# Patient Record
Sex: Female | Born: 1980 | Hispanic: No | Marital: Married | Smoking: Never smoker
Health system: Southern US, Community
[De-identification: ages and names within clinical notes are randomized; demographics above are authoritative.]

## PROBLEM LIST (undated history)

## (undated) DIAGNOSIS — Z8619 Personal history of other infectious and parasitic diseases: Secondary | ICD-10-CM

## (undated) DIAGNOSIS — Z8042 Family history of malignant neoplasm of prostate: Secondary | ICD-10-CM

## (undated) DIAGNOSIS — N921 Excessive and frequent menstruation with irregular cycle: Secondary | ICD-10-CM

## (undated) DIAGNOSIS — D259 Leiomyoma of uterus, unspecified: Secondary | ICD-10-CM

## (undated) DIAGNOSIS — L819 Disorder of pigmentation, unspecified: Secondary | ICD-10-CM

## (undated) DIAGNOSIS — L608 Other nail disorders: Secondary | ICD-10-CM

## (undated) DIAGNOSIS — D649 Anemia, unspecified: Secondary | ICD-10-CM

## (undated) DIAGNOSIS — F419 Anxiety disorder, unspecified: Secondary | ICD-10-CM

## (undated) DIAGNOSIS — D229 Melanocytic nevi, unspecified: Secondary | ICD-10-CM

## (undated) DIAGNOSIS — K219 Gastro-esophageal reflux disease without esophagitis: Secondary | ICD-10-CM

## (undated) DIAGNOSIS — Z1509 Genetic susceptibility to other malignant neoplasm: Secondary | ICD-10-CM

## (undated) DIAGNOSIS — K7401 Hepatic fibrosis, early fibrosis: Secondary | ICD-10-CM

## (undated) DIAGNOSIS — E785 Hyperlipidemia, unspecified: Secondary | ICD-10-CM

## (undated) DIAGNOSIS — L299 Pruritus, unspecified: Secondary | ICD-10-CM

## (undated) DIAGNOSIS — R7689 Other specified abnormal immunological findings in serum: Secondary | ICD-10-CM

## (undated) DIAGNOSIS — I7300 Raynaud's syndrome without gangrene: Secondary | ICD-10-CM

## (undated) DIAGNOSIS — R768 Other specified abnormal immunological findings in serum: Secondary | ICD-10-CM

## (undated) DIAGNOSIS — K8020 Calculus of gallbladder without cholecystitis without obstruction: Secondary | ICD-10-CM

## (undated) DIAGNOSIS — F1020 Alcohol dependence, uncomplicated: Secondary | ICD-10-CM

## (undated) DIAGNOSIS — B029 Zoster without complications: Secondary | ICD-10-CM

## (undated) DIAGNOSIS — Z8041 Family history of malignant neoplasm of ovary: Secondary | ICD-10-CM

## (undated) DIAGNOSIS — K8301 Primary sclerosing cholangitis: Secondary | ICD-10-CM

## (undated) DIAGNOSIS — Z1501 Genetic susceptibility to malignant neoplasm of breast: Secondary | ICD-10-CM

## (undated) HISTORY — DX: Other specified abnormal immunological findings in serum: R76.8

## (undated) HISTORY — DX: Melanocytic nevi, unspecified: D22.9

## (undated) HISTORY — PX: TUBAL LIGATION: SHX77

## (undated) HISTORY — DX: Family history of malignant neoplasm of ovary: Z80.41

## (undated) HISTORY — DX: Disorder of pigmentation, unspecified: L81.9

## (undated) HISTORY — DX: Other nail disorders: L60.8

## (undated) HISTORY — DX: Personal history of other infectious and parasitic diseases: Z86.19

## (undated) HISTORY — DX: Family history of malignant neoplasm of prostate: Z80.42

## (undated) HISTORY — DX: Primary sclerosing cholangitis: K83.01

## (undated) HISTORY — DX: Alcohol dependence, uncomplicated: F10.20

## (undated) HISTORY — DX: Pruritus, unspecified: L29.9

## (undated) HISTORY — DX: Zoster without complications: B02.9

## (undated) HISTORY — DX: Other specified abnormal immunological findings in serum: R76.89

## (undated) HISTORY — DX: Hyperlipidemia, unspecified: E78.5

## (undated) HISTORY — DX: Anemia, unspecified: D64.9

## (undated) HISTORY — DX: Calculus of gallbladder without cholecystitis without obstruction: K80.20

---
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
## 2001-02-28 HISTORY — PX: CHOLECYSTECTOMY: SHX55

## 2001-03-26 ENCOUNTER — Encounter (HOSPITAL_COMMUNITY): Admission: AD | Admit: 2001-03-26 | Discharge: 2001-04-03 | Payer: Self-pay | Admitting: *Deleted

## 2001-03-30 ENCOUNTER — Inpatient Hospital Stay (HOSPITAL_COMMUNITY): Admission: AD | Admit: 2001-03-30 | Discharge: 2001-03-30 | Payer: Self-pay | Admitting: *Deleted

## 2001-04-02 ENCOUNTER — Inpatient Hospital Stay (HOSPITAL_COMMUNITY): Admission: AD | Admit: 2001-04-02 | Discharge: 2001-04-04 | Payer: Self-pay | Admitting: *Deleted

## 2001-10-09 ENCOUNTER — Encounter: Payer: Self-pay | Admitting: Emergency Medicine

## 2001-10-09 ENCOUNTER — Emergency Department (HOSPITAL_COMMUNITY): Admission: EM | Admit: 2001-10-09 | Discharge: 2001-10-09 | Payer: Self-pay | Admitting: Emergency Medicine

## 2002-01-18 ENCOUNTER — Emergency Department (HOSPITAL_COMMUNITY): Admission: EM | Admit: 2002-01-18 | Discharge: 2002-01-18 | Payer: Self-pay | Admitting: Emergency Medicine

## 2002-01-21 ENCOUNTER — Encounter: Payer: Self-pay | Admitting: Emergency Medicine

## 2002-01-22 ENCOUNTER — Encounter (INDEPENDENT_AMBULATORY_CARE_PROVIDER_SITE_OTHER): Payer: Self-pay | Admitting: Specialist

## 2002-01-22 ENCOUNTER — Inpatient Hospital Stay (HOSPITAL_COMMUNITY): Admission: EM | Admit: 2002-01-22 | Discharge: 2002-01-23 | Payer: Self-pay | Admitting: Emergency Medicine

## 2002-01-22 ENCOUNTER — Encounter: Payer: Self-pay | Admitting: Surgery

## 2005-04-26 ENCOUNTER — Other Ambulatory Visit: Admission: RE | Admit: 2005-04-26 | Discharge: 2005-04-26 | Payer: Self-pay | Admitting: Obstetrics and Gynecology

## 2006-08-08 ENCOUNTER — Inpatient Hospital Stay (HOSPITAL_COMMUNITY): Admission: AD | Admit: 2006-08-08 | Discharge: 2006-08-11 | Payer: Self-pay | Admitting: Obstetrics and Gynecology

## 2006-08-14 ENCOUNTER — Encounter: Admission: RE | Admit: 2006-08-14 | Discharge: 2006-09-12 | Payer: Self-pay | Admitting: Obstetrics and Gynecology

## 2006-09-13 ENCOUNTER — Encounter: Admission: RE | Admit: 2006-09-13 | Discharge: 2006-10-13 | Payer: Self-pay | Admitting: Obstetrics and Gynecology

## 2006-10-14 ENCOUNTER — Encounter: Admission: RE | Admit: 2006-10-14 | Discharge: 2006-11-13 | Payer: Self-pay | Admitting: Obstetrics and Gynecology

## 2006-11-14 ENCOUNTER — Encounter: Admission: RE | Admit: 2006-11-14 | Discharge: 2006-12-13 | Payer: Self-pay | Admitting: Obstetrics and Gynecology

## 2006-12-14 ENCOUNTER — Encounter: Admission: RE | Admit: 2006-12-14 | Discharge: 2007-01-13 | Payer: Self-pay | Admitting: Obstetrics and Gynecology

## 2007-01-14 ENCOUNTER — Encounter: Admission: RE | Admit: 2007-01-14 | Discharge: 2007-02-12 | Payer: Self-pay | Admitting: Obstetrics and Gynecology

## 2007-02-13 ENCOUNTER — Encounter: Admission: RE | Admit: 2007-02-13 | Discharge: 2007-03-15 | Payer: Self-pay | Admitting: Obstetrics and Gynecology

## 2007-03-16 ENCOUNTER — Encounter: Admission: RE | Admit: 2007-03-16 | Discharge: 2007-04-15 | Payer: Self-pay | Admitting: Obstetrics and Gynecology

## 2007-04-16 ENCOUNTER — Encounter: Admission: RE | Admit: 2007-04-16 | Discharge: 2007-05-13 | Payer: Self-pay | Admitting: Obstetrics and Gynecology

## 2007-05-14 ENCOUNTER — Encounter: Admission: RE | Admit: 2007-05-14 | Discharge: 2007-05-30 | Payer: Self-pay | Admitting: Obstetrics and Gynecology

## 2010-07-13 NOTE — Op Note (Signed)
NAME:  Alicia Carr, Alicia Carr             ACCOUNT NO.:  [id number]   MEDICAL RECORD NO.:  [id number]          PATIENT TYPE:  INP   LOCATION:  9166                          FACILITY:  [facility]   PHYSICIAN:  Kendra H. Tenny Craw, MD     DATE OF BIRTH:  1980-10-27   DATE OF PROCEDURE:  08/08/2006  DATE OF DISCHARGE:                               OPERATIVE REPORT   PREOPERATIVE DIAGNOSIS:  1. 40-4/7-week intrauterine pregnancy.  2. Nonreassuring fetal well-being   POSTOPERATIVE DIAGNOSIS:  1. 40-4/7-week intrauterine pregnancy.  2. Nonreassuring fetal well-being   PROCEDURE:  Emergent low transverse cesarean section, primary cesarean  section via Pfannenstiel's skin incision.   SURGEON:  Freddrick March. Tenny Craw, M.D.   ASSISTANT:  None.   ANESTHESIA:  Epidural.   SPECIMEN:  Placenta for disposal.   ESTIMATED BLOOD LOSS:  800 mL.   COMPLICATIONS:  None.   PROCEDURE:  The patient is a 30 year old gravida 2, para 1 who presented  at 40-4/7 weeks estimated gestational age to [facility]U this morning  complaining of contractions.  She was noted to be contracting only every  20-30 minutes however, she was noted to have some late appearing  decelerations with contractions on the monitor and the decision was made  to admit her and augment labor. The patient was admitted to labor and  delivery, amniotomy was performed and Pitocin was added to augment labor  and at various times during labor she was noted to have severe variables  with contractions with slow return to baseline. On last exam she was  noted to be 6 cm dilated and IUPC was inserted and amnioinfusion was  started. Very soon after this fetal heart tones were noted to fall to  the 50s to 60s and did not return back to baseline despite position  changes, stopping Pitocin, stopping amnioinfusion and then providing  supplemental oxygen and therefore the decision was made to proceed to  the operating room for an emergency cesarean section. In the operating  room fetal heart tones were noted to be back in the low 90s to 100s. At  one point they did return to the 130s however was then returned back to  the 100s. The cervix was reexamined and the patient was found to be 8-9  cm, however was still remote from delivery and given the change in  baseline and nonreassuring status the decision was made to proceed with  emergent cesarean section. The patient was placed in the dorsal supine  position in leftward tilt, prepped and draped in the normal sterile  fashion.  Epidural anesthesia was confirmed to be adequate. A scalpel  was used to make a Pfannenstiel's skin incision which was carried down  through the underlying layers of soft tissue to the fascia.  Fascia was  incised in the midline. Fascial incision was extended laterally with  Mayo scissors.  Superior aspect of the fascial incision was tented up  and the underlying rectus muscle was dissected off sharply with the Mayo  scissors.  The same procedure was repeated on the inferior aspect of the  fascial incision and the rectus muscles  were separated in the midline  and the abdominal peritoneum was entered bluntly with blunt dissection.  The abdominal peritoneum was stretched with blunt dissection, the  bladder blade was inserted.  The vesicouterine peritoneum was  identified, tented up, entered sharply with the Metzenbaum scissors and  the incision was extended laterally and the bladder flap was created  digitally. Scalpel was then used to make a low transverse incision on  the uterus.  The fetal vertex was identified in the OP presentation,  brought up to the uterine incision and with the assistance of a mushroom  vacuum was delivered through the uterine incision.  The infant was noted  to cry vigorously on the operative field. Cord was clamped and cut and  the infant was passed to the waiting pediatricians.  Cord gas was  collected.  The placenta was delivered spontaneously.  No evidence of   abruption was noted. There was no velamentous cord insertion.  No nuchal  cord was noted at the time of delivery. No explanation could be found  for nonreassuring fetal status. The uterus was exteriorized, cleared of  all clot and debris.  Uterine incision was repaired with 0 Vicryl in a  running fashion.  A second imbricating layer was performed and a 2-0  chromic was used in a figure-of-eight fashion was used to control a  midline extraneous bleeder. The uterus was returned to the abdominal  cavity, abdominal cavity was cleared of all clot and debris.  Uterine  incision was reinspected and found to be hemostatic. No obvious bleeders  were noted but the tissue did appear friable and so Surgicel was placed  over the uterine incision prior to closure. The abdominal peritoneum was  then closed with 2-0 Vicryl and the fascia was reapproximated with 0  looped PDS and the skin was closed with staples.  All sponge, lap,  needle counts were correct x2.  The patient tolerated the procedure well  and was brought to the recovery room in stable condition following the  procedure.      Freddrick March. Tenny Craw, MD  Electronically Signed     KHR/MEDQ  D:  08/08/2006  T:  08/08/2006  Job:  [id number]

## 2010-07-13 NOTE — Discharge Summary (Signed)
NAME:  Alicia Carr, Alicia Carr             ACCOUNT NO.:  [id number]   MEDICAL RECORD NO.:  [id number]          PATIENT TYPE:  INP   LOCATION:  9145                          FACILITY:  [facility]   PHYSICIAN:  Carrington Clamp, M.D. DATE OF BIRTH:  Sep 06, 1980   DATE OF ADMISSION:  08/08/2006  DATE OF DISCHARGE:  08/11/2006                               DISCHARGE SUMMARY   FINAL DIAGNOSES:  1. Intrauterine pregnancy at 40-4/7 weeks' gestation.  2. Post-term induction of labor.  3. Nonreassuring fetal well being.   PROCEDURE:  Emergent low transverse cesarean section, emergent primary  low transverse cesarean section.  Surgeon Dr. Waynard Reeds.  Complications none.   This 30 year old, G2, P1, presents at 40-4/7 weeks' gestation.  The  patient was in possible early labor.  She was admitted at this time  secondary to post dates to augment her labor.  There were some late  appearing decelerations noted with contractions.  Amniotomy was carried  out.  Amnioinfusion was started.  Soon after this, fetal heart tones  fell to 50s to 60s and did not return to baseline despite position  change, stopping Pitocin and stopping amnio infusion as well as  supplemental oxygen.  At this point, a decision was made to take the  patient for a emergency cesarean section secondary to the nonreassuring  fetal well being.  The patient was taken to the operating room on August 08, 2006 by Dr. Waynard Reeds where a primary low transverse cesarean  section was performed with the delivery of a 6-pound 6-ounce female  infant with Apgars of 9 and 9.  Delivery went without complications.  The patient's postoperative course was benign without any significant  fevers.  The patient was felt ready for discharge on postoperative day  #3.  She was sent home on a regular diet, told to decrease activities,  was told to continue her prenatal vitamins and an iron supplement daily,  was given Percocet 1 every 4 hours as needed for pain, told  she could  use ibuprofen up to 600 mg every 6 hours as needed for pain, was to  follow up in our office in 2 weeks for an incision check.   LABORATORY DATA ON DISCHARGE:  The patient had a hemoglobin of 9.9,  white blood cell count of 10.1 and platelets of 142,000.      [name], P.A.-C.      Carrington Clamp, M.D.  Electronically Signed    MB/MEDQ  D:  09/15/2006  T:  09/16/2006  Job:  [id number]

## 2010-07-16 NOTE — H&P (Signed)
NAME:  Alicia Carr                      ACCOUNT NO.:  [id number]   MEDICAL RECORD NO.:  [id number]                   PATIENT TYPE:  INP   LOCATION:  0448                                 [facility]ACILITY:  [facility]   PHYSICIAN:  Velora Heckler, M.D.                DATE OF BIRTH:  11-Jan-1981   DATE OF ADMISSION:  01/21/2002  DATE OF DISCHARGE:                                HISTORY & PHYSICAL   REFERRING PHYSICIAN:  Carleene Cooper, M.D.   CHIEF COMPLAINT:  Abdominal pain.   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  The patient was  initially seen with epigastric abdominal pain radiating to the back on  01/18/02.  She was seen and evaluated.  Urinalysis was performed.  The  patient was prescribed Prevacid, and discharged from the emergency  department.  The patient had persistent epigastric abdominal pain radiating  to the back with onset of nausea and vomiting.  She was unable to tolerate  liquids or solid food.  This persisted.  She returned to the emergency  department today for further evaluation.   LABORATORY DATA:  Abnormal liver function tests.  The patient underwent  ultrasound of the abdomen which showed small gallstones with sludge in the  gallbladder, a dilated common bile duct to 9 mm, and the possibility of a  distal common bile duct stone.   General surgery is consulted at this time for evaluation and management.   REVIEW OF SYMPTOMS:  Fifteen system review discussed with the patient at the  bedside.  No significant other findings except as noted above.   PAST MEDICAL HISTORY:  Unremarkable.   MEDICATIONS:  1. Birth control pills.  2. Prevacid since Friday, 01/18/02.   ALLERGIES:  No known drug allergies.   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.  She does not  smoke.  She does not drink alcohol.  She is  accompanied by a college friend.   FAMILY HISTORY:  Unremarkable.   PHYSICAL EXAMINATION:  GENERAL:  A 30 year old well-developed, well-  nourished black female on a stretcher in the emergency department.  VITAL SIGNS:  Temperature 97.8, pulse 105, respirations 18, blood pressure  126/71.  HEENT:  Normocephalic, atraumatic.  Sclerae are clear.  Dentition is good.  Voice is normal.  NECK:  Symmetric.  Thyroid is normal without nodularity.  There is no  anterior or posterior cervical adenopathy.  There are no supraclavicular  masses.  LUNGS:  Clear to auscultation.  CARDIAC:  Regular rate and rhythm without murmur.  ABDOMEN:  Soft without distention.  There are bowel sounds present.  There  is mild tenderness in the epigastrium and right upper quadrant.  There are  no palpable masses.  There is  a mild Murphy's sign present.  Pain radiates  to the back.  EXTREMITIES:  Nontender without edema.  NEUROLOGIC:  The patient is alert and oriented without focal deficit.   LABORATORY DATA:  Laboratory studies showed a white blood cell count of 7.8,  hemoglobin 13.3, platelet count 288,000.  Total bilirubin 1.2, SGOT 87, SGPT  176, alkaline phosphatase 163.  Urinalysis shows positive for bilirubin and  trace leukocyte esterase.   Ultrasound shows small gallstones or sludge.  Common bile duct is dilated to  9 mm, distal common bile duct is poorly seen and a stone cannot be ruled  out.   DIAGNOSIS:  Symptomatic cholelithiasis, rule out choledocholithiasis.   PLAN:  1. Admission to [facility].  2. Initiation of empiric antibiotics.  3. Repeat laboratory studies in the a.m. on 01/22/02.  4. Consider based on laboratory study results proceeding to endoscopic     retrograde cholangiopancreatography with gastroenterology consultation     versus scheduling for a laparoscopic cholecystectomy in the next 24 to 48     hours.   I discussed in  length with the patient the indications for admission.  I  described the problems with the gallbladder and with the bile ducts.  I  explained the fact that we would repeat her laboratory studies in the  morning, and if these remained abnormal, she may require gastroenterology  evaluation and endoscopic retrograde cholangiopancreatography.  Otherwise,  she will require cholecystectomy during this admission.                                                 Velora Heckler, M.D.    TMG/MEDQ  D:  01/21/2002  T:  01/22/2002  Job:  [id number]

## 2010-12-16 LAB — CBC
HCT: 29.7 — ABNORMAL LOW
HCT: 33.9 — ABNORMAL LOW
Hemoglobin: 10.9 — ABNORMAL LOW
Hemoglobin: 9.9 — ABNORMAL LOW
MCHC: 32.3
MCHC: 33.3
MCV: 78.9
MCV: 79.3
Platelets: 142 — ABNORMAL LOW
Platelets: 180
RBC: 3.76 — ABNORMAL LOW
RBC: 4.27
RDW: 16.3 — ABNORMAL HIGH
RDW: 16.7 — ABNORMAL HIGH
WBC: 10.1
WBC: 8.3

## 2010-12-16 LAB — RPR: RPR Ser Ql: NONREACTIVE

## 2016-08-24 ENCOUNTER — Encounter (INDEPENDENT_AMBULATORY_CARE_PROVIDER_SITE_OTHER): Payer: Self-pay

## 2016-08-24 ENCOUNTER — Ambulatory Visit: Payer: Self-pay | Admitting: Physician Assistant

## 2016-08-24 ENCOUNTER — Encounter: Payer: Self-pay | Admitting: Physician Assistant

## 2016-08-24 VITALS — BP 125/79 | HR 82 | Temp 98.5°F | Resp 16 | Ht 61.0 in | Wt 178.0 lb

## 2016-08-24 DIAGNOSIS — Z0189 Encounter for other specified special examinations: Secondary | ICD-10-CM

## 2016-08-24 DIAGNOSIS — Z0000 Encounter for general adult medical examination without abnormal findings: Secondary | ICD-10-CM

## 2016-08-24 DIAGNOSIS — Z008 Encounter for other general examination: Secondary | ICD-10-CM

## 2016-08-24 NOTE — Progress Notes (Signed)
S: pt here for wellness physical and biometrics for insurance purposes, no complaints ros neg; pt is requesting a mammogram as her mother had several cysts removed from her breasts, states she has not felt any lumps or masses but her breasts are large and dense; ? If her grandmother died of breast CA; PMH:  neg  Social: occ etoh nonsmoker Fam: mental illness etoh abuse, an aunt died early of dmI  O: vitals wnl, nad, ENT wnl, neck supple no lymph, lungs c t a, cv rrr, abd soft nontender bs normal all 4 quads  A: wellness, biometric physical  P: labs, explained to pt that insurance may not pay at age 36 for mammogram screening but we would put the order in

## 2016-08-25 LAB — CMP12+LP+TP+TSH+6AC+CBC/D/PLT
ALT: 54 IU/L — ABNORMAL HIGH (ref 0–32)
AST: 46 IU/L — ABNORMAL HIGH (ref 0–40)
Albumin/Globulin Ratio: 1.7 (ref 1.2–2.2)
Albumin: 4.5 g/dL (ref 3.5–5.5)
Alkaline Phosphatase: 197 IU/L — ABNORMAL HIGH (ref 39–117)
BUN/Creatinine Ratio: 19 (ref 9–23)
BUN: 17 mg/dL (ref 6–20)
Basophils Absolute: 0 10*3/uL (ref 0.0–0.2)
Basos: 0 %
Bilirubin Total: <0.2 mg/dL (ref 0.0–1.2)
Calcium: 9.5 mg/dL (ref 8.7–10.2)
Chloride: 106 mmol/L (ref 96–106)
Chol/HDL Ratio: 3.3 ratio (ref 0.0–4.4)
Cholesterol, Total: 211 mg/dL — ABNORMAL HIGH (ref 100–199)
Creatinine, Ser: 0.91 mg/dL (ref 0.57–1.00)
EOS (ABSOLUTE): 0.2 10*3/uL (ref 0.0–0.4)
Eos: 3 %
Estimated CHD Risk: <0.5 times avg. (ref 0.0–1.0)
Free Thyroxine Index: 2.2 (ref 1.2–4.9)
GFR calc Af Amer: 95 mL/min/{1.73_m2} (ref >59)
GFR calc non Af Amer: 83 mL/min/{1.73_m2} (ref >59)
GGT: 151 IU/L — ABNORMAL HIGH (ref 0–60)
Globulin, Total: 2.6 g/dL (ref 1.5–4.5)
Glucose: 86 mg/dL (ref 65–99)
HDL: 63 mg/dL (ref >39)
Hematocrit: 37.5 % (ref 34.0–46.6)
Hemoglobin: 12.1 g/dL (ref 11.1–15.9)
Immature Grans (Abs): 0 10*3/uL (ref 0.0–0.1)
Immature Granulocytes: 0 %
Iron: 46 ug/dL (ref 27–159)
LDH: 237 IU/L — ABNORMAL HIGH (ref 119–226)
LDL Calculated: 129 mg/dL — ABNORMAL HIGH (ref 0–99)
Lymphocytes Absolute: 2 10*3/uL (ref 0.7–3.1)
Lymphs: 36 %
MCH: 26.1 pg — ABNORMAL LOW (ref 26.6–33.0)
MCHC: 32.3 g/dL (ref 31.5–35.7)
MCV: 81 fL (ref 79–97)
Monocytes Absolute: 0.4 10*3/uL (ref 0.1–0.9)
Monocytes: 6 %
Neutrophils Absolute: 3.1 10*3/uL (ref 1.4–7.0)
Neutrophils: 55 %
Phosphorus: 4.6 mg/dL — ABNORMAL HIGH (ref 2.5–4.5)
Platelets: 273 10*3/uL (ref 150–379)
Potassium: 5.1 mmol/L (ref 3.5–5.2)
RBC: 4.63 x10E6/uL (ref 3.77–5.28)
RDW: 14.9 % (ref 12.3–15.4)
Sodium: 144 mmol/L (ref 134–144)
T3 Uptake Ratio: 26 % (ref 24–39)
T4, Total: 8.5 ug/dL (ref 4.5–12.0)
TSH: 1.25 u[IU]/mL (ref 0.450–4.500)
Total Protein: 7.1 g/dL (ref 6.0–8.5)
Triglycerides: 94 mg/dL (ref 0–149)
Uric Acid: 4.5 mg/dL (ref 2.5–7.1)
VLDL Cholesterol Cal: 19 mg/dL (ref 5–40)
WBC: 5.6 10*3/uL (ref 3.4–10.8)

## 2016-08-25 LAB — VITAMIN D 25 HYDROXY (VIT D DEFICIENCY, FRACTURES): Vit D, 25-Hydroxy: 17 ng/mL — ABNORMAL LOW (ref 30.0–100.0)

## 2016-12-21 ENCOUNTER — Ambulatory Visit: Payer: Self-pay | Admitting: Physician Assistant

## 2016-12-21 ENCOUNTER — Encounter: Payer: Self-pay | Admitting: Physician Assistant

## 2016-12-21 VITALS — BP 118/79 | HR 100 | Temp 98.3°F | Resp 16

## 2016-12-21 DIAGNOSIS — E559 Vitamin D deficiency, unspecified: Secondary | ICD-10-CM

## 2016-12-21 DIAGNOSIS — R748 Abnormal levels of other serum enzymes: Secondary | ICD-10-CM

## 2016-12-21 DIAGNOSIS — J0100 Acute maxillary sinusitis, unspecified: Secondary | ICD-10-CM

## 2016-12-21 MED ORDER — AMOXICILLIN 875 MG PO TABS: 875.0000 mg | ORAL_TABLET | Freq: Two times a day (BID) | ORAL | 0 refills | Status: DC

## 2016-12-21 MED ORDER — VITAMIN D (ERGOCALCIFEROL) 1.25 MG (50000 UNIT) PO CAPS: 50000.0000 [IU] | ORAL_CAPSULE | ORAL | 3 refills | Status: DC

## 2016-12-21 MED ORDER — BENZONATATE 200 MG PO CAPS: 200.0000 mg | ORAL_CAPSULE | Freq: Three times a day (TID) | ORAL | 0 refills | Status: DC | PRN

## 2016-12-21 NOTE — Progress Notes (Signed)
S: C/o sore throat, cough,  and congestion for 5 days, no fever, chills, cp/sob, v/d; had flu shot last week, sx have gotten worse instead of better; also needs vit d rx as her vit d was 17 on recent labs  Using otc meds:   O: PE: vitals wnl, nad, perrl eomi, normocephalic, tms dull, nasal mucosa red and swollen, throat injected, neck supple no lymph, lungs c t a, cv rrr, neuro intact  A:  Acute sinusitis  P: drink fluids, continue regular meds , use otc meds of choice, return if not improving in 5 days, return earlier if worsening , amoxil, tessalon perls, rx for vit d

## 2016-12-22 LAB — HEPATIC FUNCTION PANEL
ALT: 34 IU/L — ABNORMAL HIGH (ref 0–32)
AST: 32 IU/L (ref 0–40)
Albumin: 4.5 g/dL (ref 3.5–5.5)
Alkaline Phosphatase: 181 IU/L — ABNORMAL HIGH (ref 39–117)
Bilirubin Total: <0.2 mg/dL (ref 0.0–1.2)
Bilirubin, Direct: 0.09 mg/dL (ref 0.00–0.40)
Total Protein: 7.3 g/dL (ref 6.0–8.5)

## 2016-12-22 LAB — HEPATITIS PANEL, ACUTE
Hep A IgM: NEGATIVE
Hep B C IgM: NEGATIVE
Hep C Virus Ab: <0.1 s/co ratio (ref 0.0–0.9)
Hepatitis B Surface Ag: NEGATIVE

## 2017-07-18 ENCOUNTER — Ambulatory Visit: Payer: Self-pay | Admitting: Family Medicine

## 2017-07-18 VITALS — BP 134/81 | HR 79 | Temp 98.4°F | Resp 16 | Ht 60.0 in | Wt 187.0 lb

## 2017-07-18 DIAGNOSIS — Z0189 Encounter for other specified special examinations: Principal | ICD-10-CM

## 2017-07-18 DIAGNOSIS — Z008 Encounter for other general examination: Secondary | ICD-10-CM

## 2017-07-18 LAB — GLUCOSE, POCT (MANUAL RESULT ENTRY): POC Glucose: 100 mg/dl — AB (ref 70–99)

## 2017-07-18 NOTE — Progress Notes (Signed)
Subjective: Annual biometrics screening  Patient presents for her annual biometric screening. Patient reports that she does not regularly eat a healthy, well-rounded diet or get regular exercise.  Patient reports stress at work has resulted in inactivity and gradual weight gain over the last 2 years. PCP: None currently. Patient works for [employer]. Patient denies any other issues or concerns.   Review of Systems Unremarkable  Objective  Physical Exam General: Awake, alert and oriented. No acute distress. Well developed, hydrated and nourished. Appears stated age.  HEENT: Supple neck without adenopathy. Sclera is non-icteric. The ear canal is clear without discharge. The tympanic membrane is normal in appearance with normal landmarks and cone of light. Nasal mucosa is pink and moist. Oral mucosa is pink and moist. The pharynx is normal in appearance without tonsillar swelling or exudates.  Skin: Skin in warm, dry and intact without rashes or lesions. Appropriate color for ethnicity. Cardiac: Heart rate and rhythm are normal. No murmurs, gallops, or rubs are auscultated.  Respiratory: The chest wall is symmetric and without deformity. No signs of respiratory distress. Lung sounds are clear in all lobes bilaterally without rales, ronchi, or wheezes.  Neurological: The patient is awake, alert and oriented to person, place, and time with normal speech.  Memory is normal and thought processes intact. No gait abnormalities are appreciated.  Psychiatric: Appropriate mood and affect.   Assessment Annual biometrics screening  Plan  Lipid panel pending. Encouraged routine visits with primary care provider.  Provided patient with resources and encouraged her to establish care with a new primary care provider. Fasting blood sugar is 100 today.  Discussed normal values and impaired fasting glucose.  Discussed lifestyle modifications.  Advised patient to follow-up with her primary care provider regarding  this. Encouraged patient to get regular exercise and eat a healthy, well-rounded diet.

## 2017-07-19 LAB — LIPID PANEL
Chol/HDL Ratio: 3.7 ratio (ref 0.0–4.4)
Cholesterol, Total: 217 mg/dL — ABNORMAL HIGH (ref 100–199)
HDL: 58 mg/dL (ref >39)
LDL Calculated: 139 mg/dL — ABNORMAL HIGH (ref 0–99)
Triglycerides: 100 mg/dL (ref 0–149)
VLDL Cholesterol Cal: 20 mg/dL (ref 5–40)

## 2017-07-19 NOTE — Progress Notes (Signed)
Dear [name], I wanted to let you know that your lipid panel came back.  Everything is normal, with the exception of your total cholesterol and LDL cholesterol.  Your total cholesterol is elevated at 217, normal values are between 100 and 199. Your LDL cholesterol ("bad cholesterol") is elevated at 139, normal values are below 99. These abnormal values increase your risk for cardiovascular disease.  I want you to follow-up with your primary care provider regarding these results.

## 2018-09-07 ENCOUNTER — Other Ambulatory Visit: Payer: Self-pay

## 2018-09-07 ENCOUNTER — Encounter: Payer: Self-pay | Admitting: Adult Health

## 2018-09-07 ENCOUNTER — Ambulatory Visit: Payer: Managed Care, Other (non HMO) | Admitting: Adult Health

## 2018-09-07 ENCOUNTER — Telehealth: Payer: Self-pay | Admitting: *Deleted

## 2018-09-07 DIAGNOSIS — Z20828 Contact with and (suspected) exposure to other viral communicable diseases: Secondary | ICD-10-CM | POA: Diagnosis not present

## 2018-09-07 DIAGNOSIS — Z7189 Other specified counseling: Secondary | ICD-10-CM

## 2018-09-07 DIAGNOSIS — Z789 Other specified health status: Secondary | ICD-10-CM

## 2018-09-07 DIAGNOSIS — R197 Diarrhea, unspecified: Secondary | ICD-10-CM | POA: Diagnosis not present

## 2018-09-07 DIAGNOSIS — R52 Pain, unspecified: Secondary | ICD-10-CM

## 2018-09-07 DIAGNOSIS — R05 Cough: Secondary | ICD-10-CM | POA: Diagnosis not present

## 2018-09-07 DIAGNOSIS — R059 Cough, unspecified: Secondary | ICD-10-CM

## 2018-09-07 DIAGNOSIS — Z20822 Contact with and (suspected) exposure to covid-19: Secondary | ICD-10-CM

## 2018-09-07 NOTE — Progress Notes (Addendum)
Virtual Visit via Telephone Note  I connected with Alicia Carr on 09/07/18 at 11:00 AM EDT by telephone and verified that I am speaking with the correct person using two identifiers.  Location: Patient: at home  Provider: [facility]    I discussed the limitations, risks, security and privacy concerns of performing an evaluation and management service by telephone and the availability of in person appointments. I also discussed with the patient that there may be a patient responsible charge related to this service. The patient expressed understanding and agreed to proceed.   History of Present Illness: No Known Allergies   Patient is a 38 year old female in no acute distress who calls the clinic for a telephone visit, she has had a sore throat that started on Wednesday 09/05/18.   She started with occasional cough and diarrhea, body aches. She is afebrile. She has mild ear fullness bilateral.  She denies white patches in throat.   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  She denies any tick bite. She has had diarrhea 3 times today, denies blood, tarry color or mucous. She is able to eat and drink normally. She denies any difficulty swallowing.   She has a friend who she has been in contact with he has similar symptoms and was tested for Covid 19. 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  Patient  denies any fever,chills, rash, chest pain, shortness of breath, nausea, vomiting.  Denies any pregnancy. Not breastfeeding.   LMP 3 weeks ago was normal.   Observations/Objective: 98.4 she is not taking any motrin or tylenol. No other vital signs available.   Patient is alert and oriented and responsive to questions Engages in conversation with provider. Speaks in full sentences without any pauses without any shortness of breath or distress.     Assessment and Plan: 1. Close Exposure to Covid-19 Virus   2. Educated About 2019  Novel Coronavirus Infection   3. Diarrhea, unspecified type   4. Cough   5. Generalized body aches   6. Afebrile    She denies any need for cough medication. Discussed symptom management. Hydration.  Covid 19 home monitoring ordered.   Follow Up Instructions:   Advised patient call the office or your primary care doctor for an appointment if no improvement within 72 hours or if any symptoms change or worsen at any time  Advised ER or urgent Care if after hours or on weekend. Call 911 for emergency symptoms at any time.Patinet verbalized understanding of all instructions given/reviewed and treatment plan and has no further questions or concerns at this time.     I discussed the assessment and treatment plan with the patient. The patient was provided an opportunity to ask questions and all were answered. The patient agreed with the plan and demonstrated an understanding of the instructions.   The patient was advised to call back or seek an in-person evaluation if the symptoms worsen or if the condition fails to improve as anticipated.  I provided 20 minutes of non-face-to-face time during this encounter.   Marcille Buffy, FNP

## 2018-09-07 NOTE — Telephone Encounter (Signed)
Pt called and scheduled for testing at [facility] site on 09/07/18. Pt advised to wear a mask and remain in car at scheduled appt time. Understanding verbalized.

## 2018-09-07 NOTE — Telephone Encounter (Signed)
-----   Message from Doreen Beam, [place] sent at 09/07/2018 11:19 AM EDT ----- She lives in [place] has symptoms of Covid / possible exposure needs testing prefers  testing location if available today- if not she will go to [facility] building  If she can be tested today

## 2018-09-07 NOTE — Addendum Note (Signed)
Addended by: Doreen Beam on: 09/07/2018 11:37 AM   Modules accepted: Orders

## 2018-09-09 ENCOUNTER — Encounter (INDEPENDENT_AMBULATORY_CARE_PROVIDER_SITE_OTHER): Payer: Self-pay

## 2018-09-10 ENCOUNTER — Encounter (INDEPENDENT_AMBULATORY_CARE_PROVIDER_SITE_OTHER): Payer: Self-pay

## 2018-09-10 ENCOUNTER — Telehealth: Payer: Self-pay | Admitting: *Deleted

## 2018-09-10 NOTE — Telephone Encounter (Signed)
Call and unable to LVM.

## 2018-09-10 NOTE — Telephone Encounter (Signed)
Pt called after receiving BPA for new onset SOB via [facility] Patient Monitoring questionnaire. Pt states that "I did not have SOB before so I don't know if I feel this way today because I am paranoid.It's not that bad. Feels like I have to take a gulp of air at times". Pt states that she noticed she was SOB today while sitting at the table doing work.Pt states "If feels like a little pressure, slight maybe in the middle of my chest but I don't know how to describe it". Pt advised to seek treatment in the ED tonight if she has worsening SOB, difficulty breathing or wheezing. Pt advised that she would need to establish care with a PCP in so that current symptoms could be monitored.Pt verbalized understanding.  Pt can be contacted at  [phone] to schedule a new patient appt. Pt does not have a provider preference. Pt has [employer].

## 2018-09-10 NOTE — Telephone Encounter (Signed)
Please call and set up a new patient virtual appointment.

## 2018-09-11 ENCOUNTER — Encounter (INDEPENDENT_AMBULATORY_CARE_PROVIDER_SITE_OTHER): Payer: Self-pay

## 2018-09-11 LAB — NOVEL CORONAVIRUS, NAA: SARS-CoV-2, NAA: NOT DETECTED

## 2018-09-12 ENCOUNTER — Encounter: Payer: Self-pay | Admitting: Physician Assistant

## 2018-09-12 ENCOUNTER — Ambulatory Visit (INDEPENDENT_AMBULATORY_CARE_PROVIDER_SITE_OTHER): Payer: Self-pay | Admitting: Physician Assistant

## 2018-09-12 DIAGNOSIS — Z7189 Other specified counseling: Secondary | ICD-10-CM

## 2018-09-12 DIAGNOSIS — R059 Cough, unspecified: Secondary | ICD-10-CM

## 2018-09-12 DIAGNOSIS — R05 Cough: Secondary | ICD-10-CM

## 2018-09-12 MED ORDER — PREDNISONE 20 MG PO TABS: 40.0000 mg | ORAL_TABLET | Freq: Every day | ORAL | 0 refills | Status: DC

## 2018-09-12 MED ORDER — AZITHROMYCIN 250 MG PO TABS: ORAL_TABLET | ORAL | 0 refills | Status: DC

## 2018-09-12 NOTE — Progress Notes (Signed)
Virtual Visit via Video   I connected with Alicia Carr on 09/12/18 at 10:40 AM EDT by a video enabled telemedicine application and verified that I am speaking with the correct person using two identifiers. Location patient: Home Location provider: [facility], Office Persons participating in the virtual visit: Alicia Carr Alicia Carr, [place] PA-C.  I discussed the limitations of evaluation and management by telemedicine and the availability of in person appointments. The patient expressed understanding and agreed to proceed.  I acted as a Education administrator for [employer], PA-C [employer], LPN  Subjective:   HPI:  Pt is establishing care today with our office  Cough Pt started with symptoms last Wed 7/8 sore throat, Thurs chills and diarrhea, no fever and then started with cough on Friday, was tested for Covid on 7/10 and was Negative (found out results yesterday.) Pt still c/o dry cough with wheezing, non-productive. Pt had chest tightness yesterday and now today feels better. She had not taken any medications. Presently working at home.  No LMP recorded. (Menstrual status: Irregular Periods).  Denies: SOB, loss of taste/smell, inability to keep liquids down, fever, abdominal pain  ROS: See pertinent positives and negatives per HPI.  There are no active problems to display for this patient.   Social History   Tobacco Use  . Smoking status: Never Smoker  . Smokeless tobacco: Never Used  Substance Use Topics  . Alcohol use: Yes    Comment: occasionally    Current Outpatient Medications:  .  azithromycin (ZITHROMAX) 250 MG tablet, Take two tablets on day 1, then one tablet daily x 4 days, Disp: 6 tablet, Rfl: 0 .  predniSONE (DELTASONE) 20 MG tablet, Take 2 tablets (40 mg total) by mouth daily., Disp: 10 tablet, Rfl: 0  No Known Allergies  Objective:   VITALS: Per patient if applicable, see vitals. GENERAL: Alert, appears well and in no acute distress. HEENT:  Atraumatic, conjunctiva clear, no obvious abnormalities on inspection of external nose and ears. NECK: Normal movements of the head and neck. CARDIOPULMONARY: No increased WOB. Speaking in clear sentences. I:E ratio WNL.  MS: Moves all visible extremities without noticeable abnormality. PSYCH: Pleasant and cooperative, well-groomed. Speech normal rate and rhythm. Affect is appropriate. Insight and judgement are appropriate. Attention is focused, linear, and appropriate.  NEURO: CN grossly intact. Oriented as arrived to appointment on time with no prompting. Moves both UE equally.  SKIN: No obvious lesions, wounds, erythema, or cyanosis noted on face or hands.  Assessment and Plan:   [name] was seen today for establish care and cough.  Diagnoses and all orders for this visit:  Cough; Advice Given About Covid-19 Virus Infection Patient has a respiratory illness without signs of acute distress or respiratory compromise at this time. This is likely a viral infection, which can come from a number of respiratory viruses. COVID-19 test was negative. Will treat with azithromycin and oral prednisone. I discussed with patient that if she has ANY change or worsening symptoms, she must proceed to urgent care or ER. Advised if they experience a "second sickening" or worsening symptoms as the illness progresses, they are to call the office for further instructions or seek emergent evaluation for any severe symptoms.   Other orders -     azithromycin (ZITHROMAX) 250 MG tablet; Take two tablets on day 1, then one tablet daily x 4 days -     predniSONE (DELTASONE) 20 MG tablet; Take 2 tablets (40 mg total) by mouth daily.    Marland Kitchen  Reviewed expectations re: course of current medical issues. . Discussed self-management of symptoms. . Outlined signs and symptoms indicating need for more acute intervention. . Patient verbalized understanding and all questions were answered. Marland Kitchen Health Maintenance issues including  appropriate healthy diet, exercise, and smoking avoidance were discussed with patient. . See orders for this visit as documented in the electronic medical record.  I discussed the assessment and treatment plan with the patient. The patient was provided an opportunity to ask questions and all were answered. The patient agreed with the plan and demonstrated an understanding of the instructions.   The patient was advised to call back or seek an in-person evaluation if the symptoms worsen or if the condition fails to improve as anticipated.  CMA or LPN served as scribe during this visit. History, Physical, and Plan performed by medical provider. The above documentation has been reviewed and is accurate and complete.  [place], [place] 09/12/2018

## 2018-12-26 ENCOUNTER — Encounter: Payer: Self-pay | Admitting: Internal Medicine

## 2018-12-26 ENCOUNTER — Ambulatory Visit: Payer: Managed Care, Other (non HMO) | Admitting: Internal Medicine

## 2018-12-26 ENCOUNTER — Other Ambulatory Visit: Payer: Self-pay

## 2018-12-26 VITALS — BP 120/78 | HR 94 | Temp 97.2°F | Resp 18 | Ht 61.0 in | Wt 190.0 lb

## 2018-12-26 DIAGNOSIS — E6609 Other obesity due to excess calories: Secondary | ICD-10-CM

## 2018-12-26 DIAGNOSIS — Z008 Encounter for other general examination: Secondary | ICD-10-CM | POA: Diagnosis not present

## 2018-12-26 DIAGNOSIS — Z6835 Body mass index (BMI) 35.0-35.9, adult: Secondary | ICD-10-CM

## 2018-12-26 NOTE — Progress Notes (Signed)
[facility]  Patient:Alicia Carr, DOB: 11/21/1980  Subjective:  Here for Biometric Screen/brief exam Patient is a33 year old female in no acute distress who comes to the clinic for her biometric screening and brief exam. Patient does not regularly seesaPCP (signed up with [facility] in [place]) and also has not seen an ob/gyn provider in her recent past and knows she needs to. Highly encouraged today. She is employed by [employer] and works as a Librarian, academic at [employer]. She reports she was with a program to help with diet and exercise (for prediabetics) before Covid and did for 3 weeks and having some success with weight loss and then the program stopped due to Covid. Since, "fell off the wagon" and not as good with diet nor with trying to exercise more. She feels like her weight has probably increased some as well since Covid restrictions in place.    She denies any concerns at today's visit. She reports she feels well, although not feeling very healthy due to her weight.  Patient denies any fever, body aches,chills, rash, chest pain, shortness of breath, nausea, vomiting,abdominal painsor diarrhea. No tobacco history, Denies any drug use, has 1-2 glasses of wine almost daily now.   Allergies reviewed: No Known Allergies No other allergies noted.  Medicationsreviewed: Tales no medicines presently .  Objective: BP 120/78 (BP Location: Left Arm, Patient Position: Sitting, Cuff Size: Small)   Pulse 94   Temp (!) 97.2 F (36.2 C) (Temporal)   Resp 18   Ht 5\' 1"  (1.549 m)   Wt 190 lb (86.2 kg)   SpO2 100%   BMI 35.90 kg/m '   NAD, masked, obese Patient is alert and oriented and responsive to questions Engages in eye contact with provider. Speaks in full sentences without any pauses without any shortness of breath or distress. HEENT: + glasses, PERRL, EOMI, no sinus tenderness, EAC's and TM's clear. Neck:  supple, no obvious TM, no anterior or posterior cervical lymphadenopathy. Heart: Regular rate and rhythmwithout murmurs rubs or gallops.  Lungs: Clearto auscultation Neuro:Patient moves on and off of exam table and in room without difficulty. Gait is normal   Assessment: Biometric screen 1. Encounter for other general examination-brief biometric exam with biometric screening-this is not a full annual physical.  2. Encounter for biometric screening  3. Increased BMI/obesity  4. Recommendedfollow-up with her primary care provider(not yet seen to date) and with a gyn provider as well if those services not provided through the PCP.   Plan:  glucose and lipids- fasting Discussed with patient that today's visit here is a limited biometric screening visit (not a comprehensive exam or management of any chronic problems) Discussed some health issues, including healthy eating habits and importance ofexercise to help with future attempted weight loss. Encouraged to follow-up with PCP and gyn providers for annual comprehensive preventive and wellness care, andforfollow-upand managementof any chronic medical problems or issues that may arise.Alsoencouraged toshare lab results with her PCP as well. Questions invited and answered.   I will have the office call you on your glucose and cholesterol results when they return (if cannot be sent to your Mychart account). If you have notbeen contactedwithin 1 week please call the office. Please call the clinic during office hours with any questions or concerns.  This biometric physical is a brief physical and the only labs done are glucose and your lipid panel(cholesterol) and isnot a substitute for seeing a primary care provider for a complete  annual physical. Please see a primary care physician for routine health maintenance, labs and full physical at least yearly and follow up as recommended by your provider. Provider also  recommends if you do not have a primary care provider for patient to establish care as soon as possible .Patient may chose provider of choice. Also, the [facility] at [phone] or web site at [email or web address] can help assist with finding a primary care doctor.  Patient verbalizes understanding that her office visit is acute care only and not a substitute for a primary care visit or for the management of chronic conditions

## 2018-12-27 LAB — LIPID PANEL
Chol/HDL Ratio: 3.3 ratio (ref 0.0–4.4)
Cholesterol, Total: 216 mg/dL — ABNORMAL HIGH (ref 100–199)
HDL: 66 mg/dL (ref >39)
LDL Chol Calc (NIH): 126 mg/dL — ABNORMAL HIGH (ref 0–99)
Triglycerides: 137 mg/dL (ref 0–149)
VLDL Cholesterol Cal: 24 mg/dL (ref 5–40)

## 2018-12-27 LAB — GLUCOSE, RANDOM: Glucose: 85 mg/dL (ref 65–99)

## 2018-12-27 NOTE — Progress Notes (Signed)
Sharing note sent to patient via [place]. Kessie,   Please see attached results. Your glucose was within normal range.  Your lipid panel was abnormal, with the total cholesterol high at 216, and the LDL - Cholesterol (lousy type) also elevated at 126 (desired range is less than 100).  Diet modifications to include a heart healthy diet with more fruits and vegetables, whole grains,poultry, fish and nuts with less sugary foods and beverages and also increasing your fiber intake and avoiding unhealthy fats (saturated fats) can help lower cholesterol and is recommended.  Also, increasing your physical activity levels to get some form of regular aerobic exercise at least 3-5 times per week for 20- 30 minutes duration is encouraged for good health and can help with the cholesterol.  Also, sharing these results and a follow up with your PCP is recommended.  Dr. Roxan Hockey

## 2019-04-09 ENCOUNTER — Other Ambulatory Visit: Payer: Self-pay

## 2019-04-09 ENCOUNTER — Ambulatory Visit: Payer: Self-pay | Attending: Internal Medicine

## 2019-04-09 DIAGNOSIS — Z20822 Contact with and (suspected) exposure to covid-19: Secondary | ICD-10-CM

## 2019-04-10 LAB — NOVEL CORONAVIRUS, NAA: SARS-CoV-2, NAA: NOT DETECTED

## 2019-05-31 ENCOUNTER — Ambulatory Visit: Payer: Managed Care, Other (non HMO) | Attending: Internal Medicine

## 2019-05-31 DIAGNOSIS — Z20822 Contact with and (suspected) exposure to covid-19: Secondary | ICD-10-CM | POA: Insufficient documentation

## 2019-06-01 LAB — NOVEL CORONAVIRUS, NAA: SARS-CoV-2, NAA: NOT DETECTED

## 2019-06-01 LAB — SARS-COV-2, NAA 2 DAY TAT

## 2019-10-08 ENCOUNTER — Other Ambulatory Visit: Payer: Self-pay

## 2019-10-08 ENCOUNTER — Other Ambulatory Visit: Payer: Managed Care, Other (non HMO)

## 2019-10-08 DIAGNOSIS — Z20822 Contact with and (suspected) exposure to covid-19: Secondary | ICD-10-CM

## 2019-10-10 LAB — NOVEL CORONAVIRUS, NAA: SARS-CoV-2, NAA: NOT DETECTED

## 2019-10-10 LAB — SARS-COV-2, NAA 2 DAY TAT

## 2019-11-16 ENCOUNTER — Other Ambulatory Visit: Payer: Self-pay

## 2019-11-16 ENCOUNTER — Other Ambulatory Visit: Payer: Managed Care, Other (non HMO)

## 2019-11-16 DIAGNOSIS — Z20822 Contact with and (suspected) exposure to covid-19: Secondary | ICD-10-CM

## 2019-11-18 LAB — SARS-COV-2, NAA 2 DAY TAT

## 2019-11-18 LAB — NOVEL CORONAVIRUS, NAA: SARS-CoV-2, NAA: NOT DETECTED

## 2019-11-29 ENCOUNTER — Other Ambulatory Visit: Payer: Managed Care, Other (non HMO)

## 2019-11-29 ENCOUNTER — Other Ambulatory Visit (HOSPITAL_COMMUNITY)
Admission: RE | Admit: 2019-11-29 | Discharge: 2019-11-29 | Disposition: A | Discharge status: Home / Self Care | Payer: Managed Care, Other (non HMO) | Source: Ambulatory Visit | Attending: Physician Assistant | Admitting: Physician Assistant

## 2019-11-29 ENCOUNTER — Encounter: Payer: Self-pay | Admitting: Physician Assistant

## 2019-11-29 ENCOUNTER — Ambulatory Visit (INDEPENDENT_AMBULATORY_CARE_PROVIDER_SITE_OTHER): Payer: Managed Care, Other (non HMO) | Admitting: Physician Assistant

## 2019-11-29 ENCOUNTER — Other Ambulatory Visit: Payer: Self-pay

## 2019-11-29 VITALS — BP 120/76 | HR 88 | Temp 97.9°F | Ht 60.5 in | Wt 177.4 lb

## 2019-11-29 DIAGNOSIS — R21 Rash and other nonspecific skin eruption: Secondary | ICD-10-CM | POA: Diagnosis not present

## 2019-11-29 DIAGNOSIS — Z23 Encounter for immunization: Secondary | ICD-10-CM

## 2019-11-29 DIAGNOSIS — Z124 Encounter for screening for malignant neoplasm of cervix: Secondary | ICD-10-CM

## 2019-11-29 DIAGNOSIS — E669 Obesity, unspecified: Secondary | ICD-10-CM

## 2019-11-29 DIAGNOSIS — Z13220 Encounter for screening for lipoid disorders: Secondary | ICD-10-CM

## 2019-11-29 DIAGNOSIS — Z0001 Encounter for general adult medical examination with abnormal findings: Secondary | ICD-10-CM

## 2019-11-29 DIAGNOSIS — R0989 Other specified symptoms and signs involving the circulatory and respiratory systems: Secondary | ICD-10-CM

## 2019-11-29 DIAGNOSIS — R413 Other amnesia: Secondary | ICD-10-CM | POA: Diagnosis not present

## 2019-11-29 DIAGNOSIS — Z136 Encounter for screening for cardiovascular disorders: Secondary | ICD-10-CM

## 2019-11-29 NOTE — Addendum Note (Signed)
Addended by: Marian Sorrow on: 11/29/2019 03:51 PM   Modules accepted: Orders

## 2019-11-29 NOTE — Patient Instructions (Signed)
It was great to see you!  I will be in touch with you regarding your labs -- we can send to neurology if your memory issues worsen or labs are concerning.  Follow-up with me to discuss the other issues that we did not get to today.  If your rash gets worse on your wrist, let me know.  Trial albuterol inhaler as needed for your chest congestion.  Please go to the lab for blood work.   Our office will call you with your results unless you have chosen to receive results via MyChart.  If your blood work is normal we will follow-up each year for physicals and as scheduled for chronic medical problems.  If anything is abnormal we will treat accordingly and get you in for a follow-up.  Take care,  [facility] Maintenance, Female Adopting a healthy lifestyle and getting preventive care are important in promoting health and wellness. Ask your health care provider about:  The right schedule for you to have regular tests and exams.  Things you can do on your own to prevent diseases and keep yourself healthy. What should I know about diet, weight, and exercise? Eat a healthy diet   Eat a diet that includes plenty of vegetables, fruits, low-fat dairy products, and lean protein.  Do not eat a lot of foods that are high in solid fats, added sugars, or sodium. Maintain a healthy weight Body mass index (BMI) is used to identify weight problems. It estimates body fat based on height and weight. Your health care provider can help determine your BMI and help you achieve or maintain a healthy weight. Get regular exercise Get regular exercise. This is one of the most important things you can do for your health. Most adults should:  Exercise for at least 150 minutes each week. The exercise should increase your heart rate and make you sweat (moderate-intensity exercise).  Do strengthening exercises at least twice a week. This is in addition to the moderate-intensity exercise.  Spend less  time sitting. Even light physical activity can be beneficial. Watch cholesterol and blood lipids Have your blood tested for lipids and cholesterol at 38 years of age, then have this test every 5 years. Have your cholesterol levels checked more often if:  Your lipid or cholesterol levels are high.  You are older than 38 years of age.  You are at high risk for heart disease. What should I know about cancer screening? Depending on your health history and family history, you may need to have cancer screening at various ages. This may include screening for:  Breast cancer.  Cervical cancer.  Colorectal cancer.  Skin cancer.  Lung cancer. What should I know about heart disease, diabetes, and high blood pressure? Blood pressure and heart disease  High blood pressure causes heart disease and increases the risk of stroke. This is more likely to develop in people who have high blood pressure readings, are of African descent, or are overweight.  Have your blood pressure checked: ? Every 3-5 years if you are 25-53 years of age. ? Every year if you are 45 years old or older. Diabetes Have regular diabetes screenings. This checks your fasting blood sugar level. Have the screening done:  Once every three years after age 52 if you are at a normal weight and have a low risk for diabetes.  More often and at a younger age if you are overweight or have a high risk for diabetes. What should I  know about preventing infection? Hepatitis B If you have a higher risk for hepatitis B, you should be screened for this virus. Talk with your health care provider to find out if you are at risk for hepatitis B infection. Hepatitis C Testing is recommended for:  Everyone born from [birth year] through 1964.  Anyone with known risk factors for hepatitis C. Sexually transmitted infections (STIs)  Get screened for STIs, including gonorrhea and chlamydia, if: ? You are sexually active and are younger than 38 years  of age. ? You are older than 38 years of age and your health care provider tells you that you are at risk for this type of infection. ? Your sexual activity has changed since you were last screened, and you are at increased risk for chlamydia or gonorrhea. Ask your health care provider if you are at risk.  Ask your health care provider about whether you are at high risk for HIV. Your health care provider may recommend a prescription medicine to help prevent HIV infection. If you choose to take medicine to prevent HIV, you should first get tested for HIV. You should then be tested every 3 months for as long as you are taking the medicine. Pregnancy  If you are about to stop having your period (premenopausal) and you may become pregnant, seek counseling before you get pregnant.  Take 400 to 800 micrograms (mcg) of folic acid every day if you become pregnant.  Ask for birth control (contraception) if you want to prevent pregnancy. Osteoporosis and menopause Osteoporosis is a disease in which the bones lose minerals and strength with aging. This can result in bone fractures. If you are 23 years old or older, or if you are at risk for osteoporosis and fractures, ask your health care provider if you should:  Be screened for bone loss.  Take a calcium or vitamin D supplement to lower your risk of fractures.  Be given hormone replacement therapy (HRT) to treat symptoms of menopause. Follow these instructions at home: Lifestyle  Do not use any products that contain nicotine or tobacco, such as cigarettes, e-cigarettes, and chewing tobacco. If you need help quitting, ask your health care provider.  Do not use street drugs.  Do not share needles.  Ask your health care provider for help if you need support or information about quitting drugs. Alcohol use  Do not drink alcohol if: ? Your health care provider tells you not to drink. ? You are pregnant, may be pregnant, or are planning to become  pregnant.  If you drink alcohol: ? Limit how much you use to 0-1 drink a day. ? Limit intake if you are breastfeeding.  Be aware of how much alcohol is in your drink. In the [place] one drink equals one 12 oz bottle of beer (355 mL), one 5 oz glass of wine (148 mL), or one 1 oz glass of hard liquor (44 mL). General instructions  Schedule regular health, dental, and eye exams.  Stay current with your vaccines.  Tell your health care provider if: ? You often feel depressed. ? You have ever been abused or do not feel safe at home. Summary  Adopting a healthy lifestyle and getting preventive care are important in promoting health and wellness.  Follow your health care provider's instructions about healthy diet, exercising, and getting tested or screened for diseases.  Follow your health care provider's instructions on monitoring your cholesterol and blood pressure. This information is not intended to replace advice given  to you by your health care provider. Make sure you discuss any questions you have with your health care provider. Document Revised: 02/07/2018 Document Reviewed: 02/07/2018 Elsevier Patient Education  2020 [employer].

## 2019-11-29 NOTE — Progress Notes (Signed)
I acted as a Education administrator for [employer], PA-C Anselmo Pickler, LPN   Subjective:    Alicia Carr is a 39 y.o. female and is here for a comprehensive physical exam.  HPI  Health Maintenance Due  Topic Date Due  . HIV Screening  Never done  . PAP SMEAR-Modifier  Never done  . TETANUS/TDAP  05/09/2016  . INFLUENZA VACCINE  09/29/2019    Acute Concerns: Rash-- getting issues with raised red lesions to wrist from plastic grocery bags. Lasts for about 20 minutes but doesn't cause significant symptoms.  History of eczema as a child.  She does not treat her symptoms, will go away on their own.  Does not like they are worsening with time. Memory issues -- in past year or so has had some issues with memory loss (recently had an issue with not being able to remember how to put her car in reverse.) No significant episodes that her family members has noticed.  She endorses a strong family history of Alzheimer's dementia. Chest congestion -- feels like she is having a monthly episode of chest congestion. Lasts for a few days. Never tested positive for COVID. No hx of asthma.  Denies chest pain or shortness of breath.  Feels like at times she might have some wheezing present.  Chronic Issues:  None  Health Maintenance: Immunizations -- will give Tdap and Flu today Colonoscopy -- N/A Mammogram -- N/A PAP -- overdue, done today Bone Density -- N/A Diet --tries to overall eat healthy Caffeine intake -- all day caffeine drinker Sleep habits -- 5 hours Exercise --walks the dog a few days a week, does not get scheduled exercise outside of this Current Weight -- Weight: 177 lb 6.1 oz (80.5 kg)  Weight History: Wt Readings from Last 10 Encounters:  11/29/19 177 lb 6.1 oz (80.5 kg)  12/26/18 190 lb (86.2 kg)  07/18/17 187 lb (84.8 kg)  08/24/16 178 lb (80.7 kg)   Body mass index is 34.07 kg/m. Mood --denies significant concerns Patient's last menstrual period was  11/18/2019.   Depression screen PHQ 2/9 11/29/2019  Decreased Interest 0  Down, Depressed, Hopeless 0  PHQ - 2 Score 0     Other providers/specialists: Patient Care Team: Inda Coke, [place] as PCP - General (Physician Assistant)   PMHx, SurgHx, SocialHx, Medications, and Allergies were reviewed in the Visit Navigator and updated as appropriate.   Past Medical History:  Diagnosis Date  . History of chicken pox   . Vaginal delivery 2003     Past Surgical History:  Procedure Laterality Date  . CESAREAN SECTION  08/08/2006  . CHOLECYSTECTOMY  2003     Family History  Problem Relation Age of Onset  . Mental illness Mother        paranoid schizophrenia  . Hyperlipidemia Mother   . Alcohol abuse Father   . Diabetes Paternal Aunt   . Cancer Maternal Grandmother        uncertain type    Social History   Tobacco Use  . Smoking status: Never Smoker  . Smokeless tobacco: Never Used  Vaping Use  . Vaping Use: Never used  Substance Use Topics  . Alcohol use: Yes    Comment: occasionally  . Drug use: No    Review of Systems:   Review of Systems  Constitutional: Negative for chills, fever, malaise/fatigue and weight loss.  HENT: Positive for congestion. Negative for hearing loss, sinus pain and sore throat.   Respiratory: Negative  for cough and hemoptysis.   Cardiovascular: Negative for chest pain, palpitations, leg swelling and PND.  Gastrointestinal: Negative for abdominal pain, constipation, diarrhea, heartburn, nausea and vomiting.  Genitourinary: Negative for dysuria, frequency and urgency.  Musculoskeletal: Negative for back pain, myalgias and neck pain.  Skin: Negative for itching and rash.  Neurological: Negative for dizziness, tingling, seizures and headaches.  Endo/Heme/Allergies: Positive for environmental allergies. Negative for polydipsia.  Psychiatric/Behavioral: Negative for depression. The patient is not nervous/anxious.     Objective:   BP  120/76 (BP Location: Left Arm, Patient Position: Sitting, Cuff Size: Large)   Pulse 88   Temp 97.9 F (36.6 C) (Temporal)   Ht 5' 0.5" (1.537 m)   Wt 177 lb 6.1 oz (80.5 kg)   LMP 11/18/2019   SpO2 98%   BMI 34.07 kg/m   General Appearance:    Alert, cooperative, no distress, appears stated age  Head:    Normocephalic, without obvious abnormality, atraumatic  Eyes:    PERRL, conjunctiva/corneas clear, EOM's intact, fundi    benign, both eyes  Ears:    Normal TM's and external ear canals, both ears  Nose:   Nares normal, septum midline, mucosa normal, no drainage    or sinus tenderness  Throat:   Lips, mucosa, and tongue normal; teeth and gums normal  Neck:   Supple, symmetrical, trachea midline, no adenopathy;    thyroid:  no enlargement/tenderness/nodules; no carotid   bruit or JVD  Back:     Symmetric, no curvature, ROM normal, no CVA tenderness  Lungs:     Clear to auscultation bilaterally, respirations unlabored  Chest Wall:    No tenderness or deformity   Heart:    Regular rate and rhythm, S1 and S2 normal, no murmur, rub   or gallop  Breast Exam:    No tenderness, masses, or nipple abnormality  Abdomen:     Soft, non-tender, bowel sounds active all four quadrants,    no masses, no organomegaly  Genitalia:    Normal female without lesion, discharge or tenderness  Rectal:    Normal tone no masses or tenderness  Extremities:   Extremities normal, atraumatic, no cyanosis or edema  Pulses:   2+ and symmetric all extremities  Skin:   Skin color, texture, turgor normal, no rashes or lesions  Lymph nodes:   Cervical, supraclavicular, and axillary nodes normal  Neurologic:   CNII-XII intact, normal strength, sensation and reflexes    throughout     Assessment/Plan:   Alicia Carr was seen today for annual exam.  Diagnoses and all orders for this visit:  Encounter for general adult medical examination with abnormal findings Today patient counseled on age appropriate routine  health concerns for screening and prevention, each reviewed and up to date or declined. Immunizations reviewed and up to date or declined. Labs ordered and reviewed. Risk factors for depression reviewed and negative. Hearing function and visual acuity are intact. ADLs screened and addressed as needed. Functional ability and level of safety reviewed and appropriate. Education, counseling and referrals performed based on assessed risks today. Patient provided with a copy of personalized plan for preventive services.  Rash No evidence of rash on exam today. Suspect contact dermatitis. She declines any need for intervention. Did recommend that if it were to worsen to let us know.  Memory loss Discussed options including monitoring symptoms, obtaining lab work, and/or referral to neurology. Patient would like to continue to monitor symptoms and obtain lab work today. She will consider  neurology referral. She is concerned because she has significant family history of dementia.  Recommend that she continue to log her symptoms and keep track of her medication needs to provide this at future appointment. Will check labs including TSH and B12 today. -     CBC with Differential/Platelet; Future -     Comprehensive metabolic panel; Future -     TSH; Future -     Vitamin B12; Future -     Vitamin B12 -     CBC with Differential/Platelet -     Comprehensive metabolic panel -     TSH  Encounter for lipid screening for cardiovascular disease -     Lipid panel; Future -     Lipid panel  Chest congestion Lungs clear today. I did recommend maybe trialing an as needed albuterol inhaler. Her daughter actually has asthma and she said that she will trial this and keep [place] posted if this is effective for her. Otherwise needs to follow-up for further evaluation management.  Obesity, unspecified classification, unspecified obesity type, unspecified whether serious comorbidity present Encouraged healthy eating  and exercise as able.  Pap smear for cervical cancer screening -     Cytology - PAP  Need for prophylactic vaccination with combined diphtheria-tetanus-pertussis (DTP) vaccine    Well Adult Exam: Labs ordered: Yes. Patient counseling was done. See below for items discussed. Discussed the patient's BMI. The BMI is not in the acceptable range; BMI management plan is completed Follow up Regarding additional issues that we could address today at patient's convenience.  Patient Counseling:   [x]     Nutrition: Stressed importance of moderation in sodium/caffeine intake, saturated fat and cholesterol, caloric balance, sufficient intake of fresh fruits, vegetables, fiber, calcium, iron, and 1 mg of folate supplement per day (for females capable of pregnancy).   [x]      Stressed the importance of regular exercise.    [x]     Substance Abuse: Discussed cessation/primary prevention of tobacco, alcohol, or other drug use; driving or other dangerous activities under the influence; availability of treatment for abuse.    [x]      Injury prevention: Discussed safety belts, safety helmets, smoke detector, smoking near bedding or upholstery.    [x]      Sexuality: Discussed sexually transmitted diseases, partner selection, use of condoms, avoidance of unintended pregnancy  and contraceptive alternatives.    [x]     Dental health: Discussed importance of regular tooth brushing, flossing, and dental visits.   [x]      Health maintenance and immunizations reviewed. Please refer to Health maintenance section.   CMA or LPN served as scribe during this visit. History, Physical, and Plan performed by medical provider. The above documentation has been reviewed and is accurate and complete.   Inda Coke, PA-C [place]

## 2019-12-02 ENCOUNTER — Other Ambulatory Visit: Payer: Managed Care, Other (non HMO)

## 2019-12-02 ENCOUNTER — Other Ambulatory Visit: Payer: Self-pay

## 2019-12-02 DIAGNOSIS — Z13220 Encounter for screening for lipoid disorders: Secondary | ICD-10-CM

## 2019-12-02 DIAGNOSIS — R413 Other amnesia: Secondary | ICD-10-CM

## 2019-12-02 LAB — CYTOLOGY - PAP
Adequacy: ABSENT
Comment: NEGATIVE
Diagnosis: NEGATIVE
High risk HPV: NEGATIVE

## 2019-12-03 ENCOUNTER — Other Ambulatory Visit: Payer: Self-pay | Admitting: Physician Assistant

## 2019-12-03 DIAGNOSIS — R7989 Other specified abnormal findings of blood chemistry: Secondary | ICD-10-CM

## 2019-12-03 LAB — CBC WITH DIFFERENTIAL/PLATELET
Absolute Monocytes: 359 cells/uL (ref 200–950)
Basophils Absolute: 11 cells/uL (ref 0–200)
Basophils Relative: 0.2 %
Eosinophils Absolute: 91 cells/uL (ref 15–500)
Eosinophils Relative: 1.6 %
HCT: 36.3 % (ref 35.0–45.0)
Hemoglobin: 11.7 g/dL (ref 11.7–15.5)
Lymphs Abs: 1625 cells/uL (ref 850–3900)
MCH: 26.7 pg — ABNORMAL LOW (ref 27.0–33.0)
MCHC: 32.2 g/dL (ref 32.0–36.0)
MCV: 82.7 fL (ref 80.0–100.0)
MPV: 10.6 fL (ref 7.5–12.5)
Monocytes Relative: 6.3 %
Neutro Abs: 3614 cells/uL (ref 1500–7800)
Neutrophils Relative %: 63.4 %
Platelets: 275 10*3/uL (ref 140–400)
RBC: 4.39 10*6/uL (ref 3.80–5.10)
RDW: 14.1 % (ref 11.0–15.0)
Total Lymphocyte: 28.5 %
WBC: 5.7 10*3/uL (ref 3.8–10.8)

## 2019-12-03 LAB — COMPREHENSIVE METABOLIC PANEL
AG Ratio: 1.6 (calc) (ref 1.0–2.5)
ALT: 35 U/L — ABNORMAL HIGH (ref 6–29)
AST: 31 U/L — ABNORMAL HIGH (ref 10–30)
Albumin: 4.2 g/dL (ref 3.6–5.1)
Alkaline phosphatase (APISO): 226 U/L — ABNORMAL HIGH (ref 31–125)
BUN: 13 mg/dL (ref 7–25)
CO2: 24 mmol/L (ref 20–32)
Calcium: 9 mg/dL (ref 8.6–10.2)
Chloride: 104 mmol/L (ref 98–110)
Creat: 0.82 mg/dL (ref 0.50–1.10)
Globulin: 2.7 g/dL (calc) (ref 1.9–3.7)
Glucose, Bld: 81 mg/dL (ref 65–99)
Potassium: 4.3 mmol/L (ref 3.5–5.3)
Sodium: 137 mmol/L (ref 135–146)
Total Bilirubin: 0.4 mg/dL (ref 0.2–1.2)
Total Protein: 6.9 g/dL (ref 6.1–8.1)

## 2019-12-03 LAB — LIPID PANEL
Cholesterol: 218 mg/dL — ABNORMAL HIGH (ref <200)
HDL: 58 mg/dL (ref >=50)
LDL Cholesterol (Calc): 136 mg/dL (calc) — ABNORMAL HIGH
Non-HDL Cholesterol (Calc): 160 mg/dL (calc) — ABNORMAL HIGH (ref <130)
Total CHOL/HDL Ratio: 3.8 (calc) (ref <5.0)
Triglycerides: 118 mg/dL (ref <150)

## 2019-12-03 LAB — VITAMIN B12: Vitamin B-12: 553 pg/mL (ref 200–1100)

## 2019-12-03 LAB — TSH: TSH: 1.18 mIU/L

## 2019-12-04 ENCOUNTER — Other Ambulatory Visit: Payer: Self-pay | Admitting: *Deleted

## 2019-12-04 ENCOUNTER — Telehealth: Payer: Self-pay

## 2019-12-04 DIAGNOSIS — R748 Abnormal levels of other serum enzymes: Secondary | ICD-10-CM

## 2019-12-04 NOTE — Telephone Encounter (Signed)
Patient is returning Donna's call, asked for a call back on work number.

## 2019-12-04 NOTE — Progress Notes (Signed)
He

## 2019-12-04 NOTE — Telephone Encounter (Signed)
See result notes. 

## 2019-12-18 ENCOUNTER — Other Ambulatory Visit: Payer: Self-pay

## 2019-12-18 ENCOUNTER — Other Ambulatory Visit: Payer: Managed Care, Other (non HMO)

## 2019-12-18 DIAGNOSIS — R748 Abnormal levels of other serum enzymes: Secondary | ICD-10-CM

## 2019-12-19 ENCOUNTER — Other Ambulatory Visit: Payer: Self-pay | Admitting: Physician Assistant

## 2019-12-19 DIAGNOSIS — R748 Abnormal levels of other serum enzymes: Secondary | ICD-10-CM

## 2019-12-19 LAB — HEPATIC FUNCTION PANEL
AG Ratio: 1.6 (calc) (ref 1.0–2.5)
ALT: 30 U/L — ABNORMAL HIGH (ref 6–29)
AST: 30 U/L (ref 10–30)
Albumin: 4.1 g/dL (ref 3.6–5.1)
Alkaline phosphatase (APISO): 173 U/L — ABNORMAL HIGH (ref 31–125)
Bilirubin, Direct: 0.1 mg/dL (ref 0.0–0.2)
Globulin: 2.5 g/dL (calc) (ref 1.9–3.7)
Indirect Bilirubin: 0.2 mg/dL (calc) (ref 0.2–1.2)
Total Bilirubin: 0.3 mg/dL (ref 0.2–1.2)
Total Protein: 6.6 g/dL (ref 6.1–8.1)

## 2019-12-23 ENCOUNTER — Telehealth: Payer: Self-pay

## 2019-12-23 NOTE — Telephone Encounter (Signed)
Pt would like to be called about her labs. She has been holding off on drinking alcohol and wants to know if she should continue that.   [phone]

## 2019-12-23 NOTE — Telephone Encounter (Signed)
Spoke to pt told her per [facility], Labs are improving, but remain elevated. I would like for her to repeat labs in another 2-4 weeks. The lab orders are already in. Pt verbalized understanding and said she has stopped drinking and prior to original labs pt said she was drinking 2 glasses of wine a day. Told her okay continue refraining from drinking and just call and schedule lab appt only. Pt verbalized understanding.

## 2020-01-01 ENCOUNTER — Other Ambulatory Visit: Payer: Self-pay

## 2020-01-01 ENCOUNTER — Encounter: Payer: Self-pay | Admitting: Physician Assistant

## 2020-01-01 ENCOUNTER — Ambulatory Visit (INDEPENDENT_AMBULATORY_CARE_PROVIDER_SITE_OTHER): Payer: Managed Care, Other (non HMO) | Admitting: Physician Assistant

## 2020-01-01 VITALS — BP 120/70 | HR 85 | Temp 97.3°F | Ht 60.5 in | Wt 168.5 lb

## 2020-01-01 DIAGNOSIS — E669 Obesity, unspecified: Secondary | ICD-10-CM | POA: Diagnosis not present

## 2020-01-01 DIAGNOSIS — L680 Hirsutism: Secondary | ICD-10-CM | POA: Diagnosis not present

## 2020-01-01 DIAGNOSIS — R748 Abnormal levels of other serum enzymes: Secondary | ICD-10-CM | POA: Diagnosis not present

## 2020-01-01 NOTE — Patient Instructions (Signed)
It was great to see you!  Try to be more intentional about working out -- please look into youtube exercise videos.  [place] has [facility] that can help with your weight loss -- think about this.  If your testosterone is elevated, we can trial: -birth control pills -spironolactone -metformin  Review the book about medication options as well.  I will be in touch with your lab results.  Take care,  Inda Coke PA-C

## 2020-01-01 NOTE — Progress Notes (Signed)
Alicia Carr is a 39 y.o. female here for a new problem.  I acted as a Education administrator for [employer], PA-C Anselmo Pickler, LPN   History of Present Illness:   Chief Complaint  Patient presents with   Hormonal imbalance    HPI   Hormonal imbalance Pt is here today to discuss hormones. Pt c/o thinning hair, hair on chin, weight gain, irregular periods for multiple years now. Her periods occur every 5-6 weeks and last for a full week. She did not have difficulty getting pregnant. Last time she was on OCPs she was in college.   After having her son, she had irregular periods. Then after having her daughter, her periods became regular. Then for the past 7 years or so, she has had irregular periods again. Her husband had a vasectomy so she has no need for contraception.  She endorses difficulty losing weight. Current exercise is walking the dog -- she has a german shephard that requires long walks, around 2 miles. She does not get any other intentional exercise. She has tried myfitness pal but feels like it makes her slightly obsessive with her calorie intake so she tries to not use that. She has lost almost 10 lb since last being here. States that she cut out her daily alcohol (we are currently watching her LFTs.)  Wt Readings from Last 4 Encounters:  01/01/20 168 lb 8 oz (76.4 kg)  11/29/19 177 lb 6.1 oz (80.5 kg)  12/26/18 190 lb (86.2 kg)  07/18/17 187 lb (84.8 kg)     Past Medical History:  Diagnosis Date   History of chicken pox    Vaginal delivery 2003     Social History   Tobacco Use   Smoking status: Never Smoker   Smokeless tobacco: Never Used  Vaping Use   Vaping Use: Never used  Substance Use Topics   Alcohol use: Yes    Comment: occasionally   Drug use: No    Past Surgical History:  Procedure Laterality Date   CESAREAN SECTION  08/08/2006   CHOLECYSTECTOMY  2003    Family History  Problem Relation Age of Onset   Mental illness Mother         paranoid schizophrenia   Hyperlipidemia Mother    Alcohol abuse Father    Diabetes Paternal Aunt    Cancer Maternal Grandmother        uncertain type    No Known Allergies  Current Medications:  No current outpatient medications on file.   Review of Systems:   ROS  Negative unless otherwise specified per HPI.  Vitals:   Vitals:   01/01/20 0824  BP: 120/70  Pulse: 85  Temp: (!) 97.3 F (36.3 C)  TempSrc: Temporal  SpO2: 100%  Weight: 168 lb 8 oz (76.4 kg)  Height: 5' 0.5" (1.537 m)     Body mass index is 32.37 kg/m.  Physical Exam:   Physical Exam Vitals and nursing note reviewed.  Constitutional:      General: She is not in acute distress.    Appearance: She is well-developed. She is not ill-appearing or toxic-appearing.  Cardiovascular:     Rate and Rhythm: Normal rate and regular rhythm.     Pulses: Normal pulses.     Heart sounds: Normal heart sounds, S1 normal and S2 normal.     Comments: No LE edema Pulmonary:     Effort: Pulmonary effort is normal.     Breath sounds: Normal breath sounds.  Skin:  General: Skin is warm and dry.  Neurological:     Mental Status: She is alert.     GCS: GCS eye subscore is 4. GCS verbal subscore is 5. GCS motor subscore is 6.  Psychiatric:        Speech: Speech normal.        Behavior: Behavior normal. Behavior is cooperative.     Assessment and Plan:   Alicia Carr was seen today for hormonal imbalance.  Diagnoses and all orders for this visit:  Female hirsutism Update testosterone level. If elevated, consider metformin, spiro, OCPs. -     Testosterone; Future -     Testosterone  Alkaline phosphatase elevation Update labs today for further evaluation. -     Hepatic function panel -     Gamma GT  Obesity, unspecified classification, unspecified obesity type, unspecified whether serious comorbidity present Encouraged adequate exercise - such as youtube videos or longer walks on weekends. Will check  testosterone and if elevated, consider metformin, spiro, OCPs. Also provided weight loss booklet with medication options for her to review.  CMA or LPN served as scribe during this visit. History, Physical, and Plan performed by medical provider. The above documentation has been reviewed and is accurate and complete.  Inda Coke, PA-C

## 2020-01-03 LAB — HEPATIC FUNCTION PANEL
AG Ratio: 1.7 (calc) (ref 1.0–2.5)
ALT: 45 U/L — ABNORMAL HIGH (ref 6–29)
AST: 37 U/L — ABNORMAL HIGH (ref 10–30)
Albumin: 4.3 g/dL (ref 3.6–5.1)
Alkaline phosphatase (APISO): 217 U/L — ABNORMAL HIGH (ref 31–125)
Bilirubin, Direct: 0.1 mg/dL (ref 0.0–0.2)
Globulin: 2.5 g/dL (calc) (ref 1.9–3.7)
Indirect Bilirubin: 0.2 mg/dL (calc) (ref 0.2–1.2)
Total Bilirubin: 0.3 mg/dL (ref 0.2–1.2)
Total Protein: 6.8 g/dL (ref 6.1–8.1)

## 2020-01-03 LAB — GAMMA GT: GGT: 131 U/L — ABNORMAL HIGH (ref 3–50)

## 2020-01-03 LAB — TESTOSTERONE, TOTAL, LC/MS/MS

## 2020-01-06 ENCOUNTER — Other Ambulatory Visit: Payer: Self-pay | Admitting: *Deleted

## 2020-01-06 DIAGNOSIS — R748 Abnormal levels of other serum enzymes: Secondary | ICD-10-CM

## 2020-01-08 ENCOUNTER — Other Ambulatory Visit: Payer: Managed Care, Other (non HMO)

## 2020-01-08 ENCOUNTER — Other Ambulatory Visit: Payer: Self-pay

## 2020-01-08 ENCOUNTER — Other Ambulatory Visit: Payer: Self-pay | Admitting: Physician Assistant

## 2020-01-08 DIAGNOSIS — L680 Hirsutism: Secondary | ICD-10-CM

## 2020-01-08 DIAGNOSIS — R7989 Other specified abnormal findings of blood chemistry: Secondary | ICD-10-CM

## 2020-01-10 LAB — TESTOSTERONE, TOTAL, LC/MS/MS: Testosterone, Total, LC-MS-MS: 35 ng/dL (ref 2–45)

## 2020-01-13 ENCOUNTER — Encounter: Payer: Self-pay | Admitting: Internal Medicine

## 2020-01-14 ENCOUNTER — Other Ambulatory Visit: Payer: Managed Care, Other (non HMO)

## 2020-01-17 ENCOUNTER — Telehealth: Payer: Self-pay

## 2020-01-17 NOTE — Telephone Encounter (Signed)
Patient called in and seen her lab result and her note from [place] she said she would like to hold off for right now until she see GI.

## 2020-01-17 NOTE — Telephone Encounter (Signed)
Please see message from patient

## 2020-03-11 ENCOUNTER — Ambulatory Visit (INDEPENDENT_AMBULATORY_CARE_PROVIDER_SITE_OTHER): Payer: Managed Care, Other (non HMO) | Admitting: Internal Medicine

## 2020-03-11 ENCOUNTER — Other Ambulatory Visit (INDEPENDENT_AMBULATORY_CARE_PROVIDER_SITE_OTHER): Payer: Managed Care, Other (non HMO)

## 2020-03-11 ENCOUNTER — Encounter: Payer: Self-pay | Admitting: Internal Medicine

## 2020-03-11 VITALS — BP 104/70 | HR 96 | Ht 60.75 in | Wt 152.0 lb

## 2020-03-11 DIAGNOSIS — E663 Overweight: Secondary | ICD-10-CM | POA: Diagnosis not present

## 2020-03-11 DIAGNOSIS — R945 Abnormal results of liver function studies: Secondary | ICD-10-CM

## 2020-03-11 DIAGNOSIS — R7989 Other specified abnormal findings of blood chemistry: Secondary | ICD-10-CM

## 2020-03-11 LAB — PROTIME-INR
INR: 1.1 ratio — ABNORMAL HIGH (ref 0.8–1.0)
Prothrombin Time: 12.1 s (ref 9.6–13.1)

## 2020-03-11 LAB — HEPATIC FUNCTION PANEL
ALT: 25 U/L (ref 0–35)
AST: 28 U/L (ref 0–37)
Albumin: 4.4 g/dL (ref 3.5–5.2)
Alkaline Phosphatase: 164 U/L — ABNORMAL HIGH (ref 39–117)
Bilirubin, Direct: 0.1 mg/dL (ref 0.0–0.3)
Total Bilirubin: 0.3 mg/dL (ref 0.2–1.2)
Total Protein: 7.2 g/dL (ref 6.0–8.3)

## 2020-03-11 NOTE — Progress Notes (Signed)
Alicia Carr 40 y.o. 01-28-1981 [id number]  Assessment & Plan:   Encounter Diagnoses  Name Primary?  . Abnormal LFTs (liver function tests) Yes  . Overweight (BMI 25.0-29.9)     It seems most likely that regular alcohol consumption in excess of normal has led to her abnormal liver tests though not certain.  Since she has had a significant period of time with only 2 or 3 drinks we will recheck her LFTs.  If they are persistently elevated would likely expand and go ahead and do serologic testing.  She will also get an ultrasound test to help sort this out.  I have told her that it seems likely that if this is alcoholic liver disease then stopping drinking should take care of it, and that it seems like from what I know so far that her liver function itself is normal and not permanently damaged.  However I have explained that is an educated guess and not certain.  Primary sclerosing cholangitis is in the differential as she has had elevated alkaline phosphatase often, primary biliary cholangitis also in the differential.  Do not think drug-induced liver injury is likely.  Would keep in mind the possibility of celiac disease, other autoimmune diseases though does not seem to be autoimmune hepatitis as transaminases do not predominate  She has done a good job of losing weight on her own and is moved from obese to overweight.  I have made the following additional recommendations as she does not think a 1200-calorie diet is sustainable and I agree:  Read The Obesity Code by Dr. Sharman Cheek and implement his suggestions. Handouts on insulin resistance and proper food choices to reduce that and the use of restricted feeding provided to the patient. Had a long-term approach to this and do not expect rapid results but have a 1 to 2-year timeframe to change her eating and to become fat adapted.   Orders Placed This Encounter  Procedures  . US Abdomen Complete  . Protime-INR  . Hepatic function  panel     I appreciate the opportunity to care for this patient. CC: Alicia Coke, PA    Subjective:   Chief Complaint: Abnormal liver tests  HPI Alicia Carr is a 40 year old married woman with a history of abnormal LFTs over the years who was referred by Alicia Busing, PA-C for evaluation.  The patient reports she has had abnormal liver tests for a number of years but Alicia Carr was the first person to ever follow up on them and investigate further.  The patient has been a regular user of alcohol 2 to 3 glasses of wine daily over 8 to 10 years mainly as a stress reliever.  Somewhat habitual.  It probably started when her husband took a job in [place] and they had a long distance relationship.  She has also struggled with weight and has been obese and more recently has lost about 30 pounds by severely restricting calories and eating "clean".  She does not think a 1200-calorie diet is sustainable as she is hungry a fair amount of the time.  Her platelet count has been normal white count normal.  No INR.  In 2018 she had negative hepatitis A, B, and C testing.  No family history of liver disease.  She does have a stressful job some insomnia issues.  She has replaced alcohol with things like [place] tea and kombucha.  That is going well and she does not really miss the alcohol although she did have  2 or 3 glasses of wine on separate occasions around the holidays.  She is concerned about how damaged her liver might be.  Her husband is concerned as well.  She does have a history of cholecystectomy.  Ultrasound at that time did not demonstrated any Liverel parenchymal abnormalities though that was in 2003.  3 years ago she had alk phos of 197 and 181 AST of 46 and 32 and ALT of 54 and 34  Results for Alicia Carr, Alicia Carr (MRN [id number]) as of 03/11/2020 12:50  Ref. Range 12/02/2019 08:23 12/18/2019 08:08 01/01/2020 09:14 01/08/2020 08:17  AG Ratio Latest Ref Range: 1.0 - 2.5 (calc) 1.6 1.6 1.7   AST  Latest Ref Range: 10 - 30 U/L 31 (H) 30 37 (H)   ALT Latest Ref Range: 6 - 29 U/L 35 (H) 30 (H) 45 (H)   Total Protein Latest Ref Range: 6.1 - 8.1 g/dL 6.9 6.6 6.8   Bilirubin, Direct Latest Ref Range: 0.0 - 0.2 mg/dL  0.1 0.1   Indirect Bilirubin Latest Ref Range: 0.2 - 1.2 mg/dL (calc)  0.2 0.2   Total Bilirubin Latest Ref Range: 0.2 - 1.2 mg/dL 0.4 0.3 0.3   GGT Latest Ref Range: 3 - 50 U/L   131 (H)    No Known Allergies Current Meds  Medication Sig  . BIOTIN PO Take 1 tablet by mouth daily.  . COLLAGEN PO Take 2 tablets by mouth daily.   Past Medical History:  Diagnosis Date  . Alcoholism (Alicia Carr)   . Anemia   . Gallstones   . History of chicken pox   . Vaginal delivery 2003   Past Surgical History:  Procedure Laterality Date  . CESAREAN SECTION  08/08/2006  . 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   Review of Systems As per HPI.  Some fatigue perhaps a little brain fog or confusion.  Objective:   Physical Exam BP 104/70 (BP Location: Left Arm, Patient Position: Sitting, Cuff Size: Normal)   Pulse 96   Ht 5' 0.75" (1.543 m) Comment: height measured without shoes  Wt 152 lb (68.9 kg)   LMP 02/20/2020   BMI 28.96 kg/m  Middle-age bw NAD Anicteric Lungs cta cor Nl S1-S2 no rubs murmurs or gallops abd soft sl obese neg for hepatosplenomegaly or mass bowel sounds present Extremities Free of cyanosis clubbing or edema Alert and oriented x3 Appropriate mood and affect  Data reviewed includes primary care  notes labs imaging in the EMR

## 2020-03-11 NOTE — Patient Instructions (Signed)
Your provider has requested that you go to the basement level for lab work before leaving today. Press "B" on the elevator. The lab is located at the first door on the left as you exit the elevator.   Check into getting THE OBESITY CODE book by Dr Sharman Cheek.   You have been scheduled for an abdominal ultrasound at on 03/26/2020 at [facility]. Please arrive 15 minutes prior to your appointment for registration. Make certain not to have anything to eat or drink 6 hours prior to your appointment. Should you need to reschedule your appointment, please contact radiology at [phone]. This test typically takes about 30 minutes to perform.  Due to recent changes in healthcare laws, you may see the results of your imaging and laboratory studies on MyChart before your provider has had a chance to review them.  We understand that in some cases there may be results that are confusing or concerning to you. Not all laboratory results come back in the same time frame and the provider may be waiting for multiple results in order to interpret others.  Please give [place] 48 hours in order for your provider to thoroughly review all the results before contacting the office for clarification of your results.     I appreciate the opportunity to care for you. Ronney Lion, [facility]

## 2020-03-26 ENCOUNTER — Ambulatory Visit
Admission: RE | Admit: 2020-03-26 | Discharge: 2020-03-26 | Disposition: A | Discharge status: Home / Self Care | Payer: Managed Care, Other (non HMO) | Source: Ambulatory Visit | Attending: Internal Medicine | Admitting: Internal Medicine

## 2020-03-26 ENCOUNTER — Other Ambulatory Visit: Payer: Self-pay

## 2020-03-26 DIAGNOSIS — R945 Abnormal results of liver function studies: Secondary | ICD-10-CM

## 2020-03-26 DIAGNOSIS — E663 Overweight: Secondary | ICD-10-CM

## 2020-03-26 DIAGNOSIS — R7989 Other specified abnormal findings of blood chemistry: Secondary | ICD-10-CM

## 2020-03-27 ENCOUNTER — Telehealth: Payer: Self-pay | Admitting: Internal Medicine

## 2020-03-27 NOTE — Telephone Encounter (Signed)
Left message for patient to call back  

## 2020-03-27 NOTE — Telephone Encounter (Signed)
Pt is requesting a call back from a nurse again regarding her [place] results, pt states she does not understand her results.

## 2020-03-27 NOTE — Telephone Encounter (Signed)
Inbound call from patient returning your call. 

## 2020-03-30 NOTE — Telephone Encounter (Signed)
All questions answered about fatty liver. She will keep her upcoming appointment on 3/22

## 2020-05-15 ENCOUNTER — Other Ambulatory Visit (INDEPENDENT_AMBULATORY_CARE_PROVIDER_SITE_OTHER): Payer: Managed Care, Other (non HMO)

## 2020-05-15 DIAGNOSIS — R945 Abnormal results of liver function studies: Secondary | ICD-10-CM

## 2020-05-15 DIAGNOSIS — E663 Overweight: Secondary | ICD-10-CM

## 2020-05-15 DIAGNOSIS — R7989 Other specified abnormal findings of blood chemistry: Secondary | ICD-10-CM

## 2020-05-15 LAB — HEPATIC FUNCTION PANEL
ALT: 27 U/L (ref 0–35)
AST: 28 U/L (ref 0–37)
Albumin: 4.1 g/dL (ref 3.5–5.2)
Alkaline Phosphatase: 177 U/L — ABNORMAL HIGH (ref 39–117)
Bilirubin, Direct: 0.1 mg/dL (ref 0.0–0.3)
Total Bilirubin: 0.3 mg/dL (ref 0.2–1.2)
Total Protein: 7.2 g/dL (ref 6.0–8.3)

## 2020-05-19 ENCOUNTER — Other Ambulatory Visit (INDEPENDENT_AMBULATORY_CARE_PROVIDER_SITE_OTHER): Payer: Managed Care, Other (non HMO)

## 2020-05-19 ENCOUNTER — Encounter: Payer: Self-pay | Admitting: Internal Medicine

## 2020-05-19 ENCOUNTER — Ambulatory Visit (INDEPENDENT_AMBULATORY_CARE_PROVIDER_SITE_OTHER): Payer: Managed Care, Other (non HMO) | Admitting: Internal Medicine

## 2020-05-19 VITALS — BP 104/68 | HR 88 | Ht 60.75 in | Wt 135.1 lb

## 2020-05-19 DIAGNOSIS — R7989 Other specified abnormal findings of blood chemistry: Secondary | ICD-10-CM

## 2020-05-19 DIAGNOSIS — E663 Overweight: Secondary | ICD-10-CM

## 2020-05-19 DIAGNOSIS — R945 Abnormal results of liver function studies: Secondary | ICD-10-CM

## 2020-05-19 LAB — FERRITIN: Ferritin: 4.1 ng/mL — ABNORMAL LOW (ref 10.0–291.0)

## 2020-05-19 LAB — HEPATIC FUNCTION PANEL
ALT: 25 U/L (ref 0–35)
AST: 26 U/L (ref 0–37)
Albumin: 4.6 g/dL (ref 3.5–5.2)
Alkaline Phosphatase: 170 U/L — ABNORMAL HIGH (ref 39–117)
Bilirubin, Direct: 0.1 mg/dL (ref 0.0–0.3)
Total Bilirubin: 0.3 mg/dL (ref 0.2–1.2)
Total Protein: 7.7 g/dL (ref 6.0–8.3)

## 2020-05-19 NOTE — Patient Instructions (Addendum)
Fantastic job on weight loss!  Consider using mediterranean diet now - I think that will be very healthy. Handout provided.  Check out the Med Instead of Meds website also  [email or web address]  Labs today and I will message you.  Stay off alcohol.   Your provider has requested that you go to the basement level for lab work before leaving today. Press "B" on the elevator. The lab is located at the first door on the left as you exit the elevator.   Due to recent changes in healthcare laws, you may see the results of your imaging and laboratory studies on MyChart before your provider has had a chance to review them.  We understand that in some cases there may be results that are confusing or concerning to you. Not all laboratory results come back in the same time frame and the provider may be waiting for multiple results in order to interpret others.  Please give [place] 48 hours in order for your provider to thoroughly review all the results before contacting the office for clarification of your results.   I appreciate the opportunity to care for you. Silvano Rusk, MD, [facility]

## 2020-05-19 NOTE — Progress Notes (Signed)
   Alicia Carr 40 y.o. 1980/10/29 [id number]  Assessment & Plan:   Encounter Diagnoses  Name Primary?  . Abnormal LFTs (liver function tests) Yes  . Overweight (BMI 25.0-29.9)     She has done a great job with weight loss and she is not drinking so perhaps there is another underlying issue so we will need to do a further serologic work-up.  See orders below.  We talked about using a Mediterranean diet with less caloric restriction to maintain weight loss but not struggle with reduced calories and yoyoing back up with weight.  She will remain off alcohol for now.  Orders Placed This Encounter  Procedures  . Hepatic function panel  . Mitochondrial antibodies  . ANA  . Ferritin  . Tissue transglutaminase, IgA  . Anti-smooth muscle antibody, IgG  . Alpha-1-antitrypsin  . Ceruloplasmin  . IgA     I appreciate the opportunity to care for this patient. CC: Alicia Coke, PA   Subjective:   Chief Complaint: Follow-up of abnormal liver tests  HPI Alicia Carr returns in follow-up, I had met her 2 months ago at the request of Alicia Coke, PA-C regarding abnormal liver tests with question of alcoholic fatty liver .  She gave up all alcohol, she has lost weight as you can see below, about 11% of her body weight using my fitness pal and eating 1200 cal a day.  Repeat liver tests have shown a persistent elevation in alkaline phosphatase.   Wt Readings from Last 3 Encounters:  05/19/20 135 lb 2 oz (61.3 kg)  03/11/20 152 lb (68.9 kg)  01/01/20 168 lb 8 oz (76.4 kg)     No Known Allergies Current Meds  Medication Sig  . BIOTIN PO Take 1 tablet by mouth daily.  . COLLAGEN PO Take 2 tablets by mouth daily.   Past Medical History:  Diagnosis Date  . Alcoholism ([place])   . Anemia   . Gallstones   . History of chicken pox   . Vaginal delivery 2003   Past Surgical History:  Procedure Laterality Date  . CESAREAN SECTION  08/08/2006  . 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   Review of Systems As per HPI  Objective:   Physical Exam BP 104/68 (BP Location: Left Arm, Patient Position: Sitting, Cuff Size: Normal)   Pulse 88   Ht 5' 0.75" (1.543 m)   Wt 135 lb 2 oz (61.3 kg)   LMP 04/22/2020   BMI 25.74 kg/m

## 2020-05-22 ENCOUNTER — Other Ambulatory Visit: Payer: Self-pay

## 2020-05-23 LAB — TEST AUTHORIZATION

## 2020-05-23 LAB — ANTI-SMOOTH MUSCLE ANTIBODY, IGG: Actin (Smooth Muscle) Antibody (IGG): 25 U — ABNORMAL HIGH (ref <20)

## 2020-05-23 LAB — CERULOPLASMIN: Ceruloplasmin: 40 mg/dL (ref 18–53)

## 2020-05-23 LAB — ANA: Anti Nuclear Antibody (ANA): POSITIVE — AB

## 2020-05-23 LAB — MITOCHONDRIAL ANTIBODIES: Mitochondrial M2 Ab, IgG: <=20 U

## 2020-05-23 LAB — ANTI-NUCLEAR AB-TITER (ANA TITER)
ANA TITER: 1:80 {titer} — ABNORMAL HIGH
ANA Titer 1: 1:80 {titer} — ABNORMAL HIGH

## 2020-05-23 LAB — IGA: Immunoglobulin A: 137 mg/dL (ref 47–310)

## 2020-05-23 LAB — ALPHA-1-ANTITRYPSIN: A-1 Antitrypsin, Ser: 200 mg/dL — ABNORMAL HIGH (ref 83–199)

## 2020-05-23 LAB — IGG: IgG (Immunoglobin G), Serum: 1168 mg/dL (ref 600–1640)

## 2020-05-23 LAB — TISSUE TRANSGLUTAMINASE, IGA: (tTG) Ab, IgA: <1 U/mL

## 2020-05-27 ENCOUNTER — Other Ambulatory Visit: Payer: Self-pay

## 2020-05-27 ENCOUNTER — Telehealth: Payer: Self-pay | Admitting: Internal Medicine

## 2020-05-27 DIAGNOSIS — R7989 Other specified abnormal findings of blood chemistry: Secondary | ICD-10-CM

## 2020-05-27 DIAGNOSIS — R945 Abnormal results of liver function studies: Secondary | ICD-10-CM

## 2020-05-27 NOTE — Telephone Encounter (Signed)
I called her regarding abnormal anti-smooth muscle antibodies and abnormal ANA in the setting of an elevated alkaline phosphatase.  She has stopped alcohol and has had persistent elevation of the alk phos.  No other clear etiology.  I think a liver biopsy makes sense to understand this better.  Question small duct PSC perhaps.  I have reviewed the risks benefits and indications of percutaneous liver biopsy and she understands and agrees to proceed.   We need to refer her for ultrasound-guided liver biopsy by interventional radiology regarding abnormal alkaline phosphatase and abnormal ANA  Please also indicate in the request to try to obtain better imaging of the left lobe of the liver as previously it was obscured by bowel gas on ultrasound

## 2020-05-28 NOTE — Telephone Encounter (Signed)
Patient is scheduled for 06/10/20 at 1:00

## 2020-05-28 NOTE — Telephone Encounter (Signed)
Orders placed for IR liver bx.  Patient will be contacted directly with an appointment date and time.

## 2020-05-29 HISTORY — PX: LIVER BIOPSY: SHX301

## 2020-06-09 ENCOUNTER — Other Ambulatory Visit: Payer: Self-pay | Admitting: Student

## 2020-06-09 ENCOUNTER — Other Ambulatory Visit: Payer: Self-pay | Admitting: Radiology

## 2020-06-10 ENCOUNTER — Other Ambulatory Visit: Payer: Self-pay

## 2020-06-10 ENCOUNTER — Ambulatory Visit (HOSPITAL_COMMUNITY)
Admission: RE | Admit: 2020-06-10 | Discharge: 2020-06-10 | Disposition: A | Discharge status: Home / Self Care | Payer: Managed Care, Other (non HMO) | Source: Ambulatory Visit | Attending: Internal Medicine | Admitting: Internal Medicine

## 2020-06-10 DIAGNOSIS — K7400 Hepatic fibrosis, unspecified: Secondary | ICD-10-CM | POA: Diagnosis not present

## 2020-06-10 DIAGNOSIS — R7989 Other specified abnormal findings of blood chemistry: Secondary | ICD-10-CM

## 2020-06-10 DIAGNOSIS — R945 Abnormal results of liver function studies: Secondary | ICD-10-CM | POA: Diagnosis not present

## 2020-06-10 DIAGNOSIS — K8309 Other cholangitis: Secondary | ICD-10-CM | POA: Insufficient documentation

## 2020-06-10 LAB — CBC
HCT: 32.7 % — ABNORMAL LOW (ref 36.0–46.0)
Hemoglobin: 10.1 g/dL — ABNORMAL LOW (ref 12.0–15.0)
MCH: 23.8 pg — ABNORMAL LOW (ref 26.0–34.0)
MCHC: 30.9 g/dL (ref 30.0–36.0)
MCV: 77.1 fL — ABNORMAL LOW (ref 80.0–100.0)
Platelets: 319 10*3/uL (ref 150–400)
RBC: 4.24 MIL/uL (ref 3.87–5.11)
RDW: 15.3 % (ref 11.5–15.5)
WBC: 6 10*3/uL (ref 4.0–10.5)
nRBC: 0 % (ref 0.0–0.2)

## 2020-06-10 LAB — PREGNANCY, URINE: Preg Test, Ur: NEGATIVE

## 2020-06-10 LAB — PROTIME-INR
INR: 1 (ref 0.8–1.2)
Prothrombin Time: 13.1 seconds (ref 11.4–15.2)

## 2020-06-10 MED ORDER — LIDOCAINE HCL (PF) 1 % IJ SOLN
INTRAMUSCULAR | Status: AC
  Filled 2020-06-10: qty 30

## 2020-06-10 MED ORDER — MIDAZOLAM HCL 2 MG/2ML IJ SOLN
INTRAMUSCULAR | Status: AC | PRN
  Administered 2020-06-10: 1 mg via INTRAVENOUS

## 2020-06-10 MED ORDER — FENTANYL CITRATE (PF) 100 MCG/2ML IJ SOLN
INTRAMUSCULAR | Status: AC | PRN
  Administered 2020-06-10: 25 ug via INTRAVENOUS

## 2020-06-10 MED ORDER — GELATIN ABSORBABLE 12-7 MM EX MISC
CUTANEOUS | Status: AC
  Filled 2020-06-10: qty 1

## 2020-06-10 MED ORDER — FENTANYL CITRATE (PF) 100 MCG/2ML IJ SOLN
INTRAMUSCULAR | Status: AC
  Filled 2020-06-10: qty 2

## 2020-06-10 MED ORDER — SODIUM CHLORIDE 0.9 % IV SOLN: INTRAVENOUS | Status: DC

## 2020-06-10 MED ORDER — MIDAZOLAM HCL 2 MG/2ML IJ SOLN
INTRAMUSCULAR | Status: AC
  Filled 2020-06-10: qty 2

## 2020-06-10 NOTE — H&P (Signed)
Chief Complaint: Patient was seen in consultation today for elevated LFTs/random liver biopsy.  Referring Physician(s): Gessner,Carl E (GI)  Supervising Physician: Corrie Mckusick  Patient Status: [facility] - Out-pt  History of Present Illness: Alicia Carr is a 40 y.o. female with a past medical history of cholelithiasis, anemia, and alcohol abuse. She was noted to have elevated LFTs (AST/ALT/alk phos) on routine lab work, concerning for possible alcoholic fatty liver disease. She was referred to GI for further management. She first pursued conservative management, including cessation of alcohol and dietary changes/weight loss- this improved some LFTs (AST/ALT normalized), however alk phos remains elevated.  IR consulted by Dr. Carlean Purl for possible image-guided random liver biopsy. Patient awake and alert laying in bed with no complaints at this time. Denies fever, chills, chest pain, dyspnea, abdominal pain, or headache.   Past Medical History:  Diagnosis Date  . Alcoholism ([place])   . Anemia   . Gallstones   . History of chicken pox   . Vaginal delivery 2003    Past Surgical History:  Procedure Laterality Date  . CESAREAN SECTION  08/08/2006  . CHOLECYSTECTOMY  2003    Allergies: Patient has no known allergies.  Medications: Prior to Admission medications   Medication Sig Start Date End Date Taking? Authorizing Provider  BIOTIN PO Take 1 tablet by mouth daily.   Yes [provider]  COLLAGEN PO Take 1 Scoop by mouth 2 (two) times daily.   Yes [provider]  Tetrahydrozoline HCl (VISINE OP) Place 1 drop into both eyes daily as needed (redness).   Yes [provider]     Family History  Problem Relation Age of Onset  . Mental illness Mother        paranoid schizophrenia  . Hyperlipidemia Mother   . Alcohol abuse Father   . Diabetes Paternal Aunt   . Cancer Maternal Grandmother        uncertain type    Social History    Socioeconomic History  . Marital status: Married    Spouse name: Not on file  . Number of children: 2  . Years of education: Not on file  . Highest education level: Not on file  Occupational History  . Occupation: Field seismologist  Tobacco Use  . Smoking status: Never Smoker  . Smokeless tobacco: Never Used  Vaping Use  . Vaping Use: Never used  Substance and Sexual Activity  . Alcohol use: Not Currently    Comment: 2.5 per day x8 to 10 years stopped 01/18/2020  . Drug use: No  . Sexual activity: Yes    Birth control/protection: None  Other Topics Concern  . 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  All other systems are negative.  Review of Systems  Constitutional: Negative  for chills and fever.  Respiratory: Negative for shortness of breath and wheezing.   Cardiovascular: Negative for chest pain and palpitations.  Gastrointestinal: Negative for abdominal pain.  Neurological: Negative for headaches.  Psychiatric/Behavioral: Negative for behavioral problems and confusion.    Vital Signs: LMP 05/20/2020   Physical Exam Vitals and nursing note reviewed.  Constitutional:      General: She is not in acute distress.    Appearance: Normal appearance.  Cardiovascular:     Rate and Rhythm: Normal rate and regular rhythm.     Heart sounds: Normal heart sounds. No murmur  heard.   Pulmonary:     Effort: Pulmonary effort is normal. No respiratory distress.     Breath sounds: Normal breath sounds. No wheezing.  Abdominal:     Palpations: Abdomen is soft.     Tenderness: There is no abdominal tenderness.  Skin:    General: Skin is warm and dry.  Neurological:     Mental Status: She is alert and oriented to person, place, and time.      MD Evaluation Airway: WNL Heart: WNL Abdomen: WNL Chest/ Lungs: WNL ASA  Classification: 2 Mallampati/Airway Score: Two   Imaging: No results found.  Labs:  CBC: Recent Labs    12/02/19 0823 06/10/20 1239  WBC 5.7 6.0  HGB 11.7 10.1*  HCT 36.3 32.7*  PLT 275 319    COAGS: Recent Labs    03/11/20 1000  INR 1.1*    BMP: Recent Labs    12/02/19 0823  NA 137  K 4.3  CL 104  CO2 24  GLUCOSE 81  BUN 13  CALCIUM 9.0  CREATININE 0.82    LIVER FUNCTION TESTS: Recent Labs    01/01/20 0914 03/11/20 1000 05/15/20 0821 05/19/20 1126  BILITOT 0.3 0.3 0.3 0.3  AST 37* 28 28 26   ALT 45* 25 27 25   ALKPHOS  --  164* 177* 170*  PROT 6.8 7.2 7.2 7.7  ALBUMIN  --  4.4 4.1 4.6     Assessment and Plan:  Elevated LFTs (specifically alkaline phosphatase) despite dietary changes/alcohol restriction. Plan for image-guided random liver biopsy today in IR. Patient is NPO. Afebrile and WBCs WNL. She does not take blood thinners. INR pending.  Risks and benefits discussed with the patient including, but not limited to bleeding, infection, damage to adjacent structures or low yield requiring additional tests. All of the patient's questions were answered, patient is agreeable to proceed. Consent signed and in chart.   Thank you for this interesting consult.  I greatly enjoyed meeting Alicia Carr and look forward to participating in their care.  A copy of this report was sent to the requesting provider on this date.  Electronically Signed: Earley Abide, PA-C 06/10/2020, 1:40 PM   I  spent a total of 30 Minutes in face to face in clinical consultation, greater than 50% of which was counseling/coordinating care for elevated LFTs/random liver biopsy.

## 2020-06-10 NOTE — Sedation Documentation (Signed)
Attempted to call report to short stay. 

## 2020-06-10 NOTE — Discharge Instructions (Signed)
Liver Biopsy, Care After These instructions give you information on caring for yourself after your procedure. Your doctor may also give you more specific instructions. Call your doctor if you have any problems or questions after your procedure. What can I expect after the procedure? After the procedure, it is common to have:  Pain and soreness where the biopsy was done.  Bruising around the area where the biopsy was done.  Sleepiness and be tired for a few days. Follow these instructions at home: Medicines  Take over-the-counter and prescription medicines only as told by your doctor.  If you were prescribed an antibiotic medicine, take it as told by your doctor. Do not stop taking the antibiotic even if you start to feel better.  Do not take medicines such as aspirin and ibuprofen. These medicines can thin your blood. Do not take these medicines unless your doctor tells you to take them.  If you are taking prescription pain medicine, take actions to prevent or treat constipation. Your doctor may recommend that you: ? Drink enough fluid to keep your pee (urine) clear or pale yellow. ? Take over-the-counter or prescription medicines. ? Eat foods that are high in fiber, such as fresh fruits and vegetables, whole grains, and beans. ? Limit foods that are high in fat and processed sugars, such as fried and sweet foods. Caring for your cut  Follow instructions from your doctor about how to take care of your cuts from surgery (incisions). Make sure you: ? Wash your hands with soap and water before you change your bandage (dressing). If you cannot use soap and water, use hand sanitizer. ? Change your bandage as told by your doctor. ? Leave stitches (sutures), skin glue, or skin tape (adhesive) strips in place. They may need to stay in place for 2 weeks or longer. If tape strips get loose and curl up, you may trim the loose edges. Do not remove tape strips completely unless your doctor says it is  okay.  Check your cuts every day for signs of infection. Check for: ? Redness, swelling, or more pain. ? Fluid or blood. ? Pus or a bad smell. ? Warmth.  Do not take baths, swim, or use a hot tub until your doctor says it is okay to do so. Activity  Rest at home for 1-2 days or as told by your doctor. ? Avoid sitting for a long time without moving. Get up to take short walks every 1-2 hours.  Return to your normal activities as told by your doctor. Ask what activities are safe for you.  Do not do these things in the first 24 hours: ? Drive. ? Use machinery. ? Take a bath or shower.  Do not lift more than 10 pounds (4.5 kg) or play contact sports for the first 2 weeks.   General instructions  Do not drink alcohol in the first week after the procedure.  Have someone stay with you for at least 24 hours after the procedure.  Get your test results. Ask your doctor or the department that is doing the test: ? When will my results be ready? ? How will I get my results? ? What are my treatment options? ? What other tests do I need? ? What are my next steps?  Keep all follow-up visits as told by your doctor. This is important.   Contact a doctor if:  A cut bleeds and leaves more than just a small spot of blood.  A cut is red,  puffs up (swells), or hurts more than before.  Fluid or something else comes from a cut.  A cut smells bad.  You have a fever or chills. Get help right away if:  You have swelling, bloating, or pain in your belly (abdomen).  You get dizzy or faint.  You have a rash.  You feel sick to your stomach (nauseous) or throw up (vomit).  You have trouble breathing, feel short of breath, or feel faint.  Your chest hurts.  You have problems talking or seeing.  You have trouble with your balance or moving your arms or legs. Summary  After the procedure, it is common to have pain, soreness, bruising, and tiredness.  Your doctor will tell you how to  take care of yourself at home. Change your bandage, take your medicines, and limit your activities as told by your doctor.  Call your doctor if you have symptoms of infection. Get help right away if your belly swells, your cut bleeds a lot, or you have trouble talking or breathing. This information is not intended to replace advice given to you by your health care provider. Make sure you discuss any questions you have with your health care provider. Document Revised: 02/23/2017 Document Reviewed: 02/24/2017 Elsevier Patient Education  2021 [place]. Moderate Conscious Sedation, Adult, Care After This sheet gives you information about how to care for yourself after your procedure. Your health care provider may also give you more specific instructions. If you have problems or questions, contact your health care provider. What can I expect after the procedure? After the procedure, it is common to have:  Sleepiness for several hours.  Impaired judgment for several hours.  Difficulty with balance.  Vomiting if you eat too soon. Follow these instructions at home: For the time period you were told by your health care provider:  Rest.  Do not participate in activities where you could fall or become injured.  Do not drive or use machinery.  Do not drink alcohol.  Do not take sleeping pills or medicines that cause drowsiness.  Do not make important decisions or sign legal documents.  Do not take care of children on your own.      Eating and drinking  Follow the diet recommended by your health care provider.  Drink enough fluid to keep your urine pale yellow.  If you vomit: ? Drink water, juice, or soup when you can drink without vomiting. ? Make sure you have little or no nausea before eating solid foods.   General instructions  Take over-the-counter and prescription medicines only as told by your health care provider.  Have a responsible adult stay with you for the time  you are told. It is important to have someone help care for you until you are awake and alert.  Do not smoke.  Keep all follow-up visits as told by your health care provider. This is important. Contact a health care provider if:  You are still sleepy or having trouble with balance after 24 hours.  You feel light-headed.  You keep feeling nauseous or you keep vomiting.  You develop a rash.  You have a fever.  You have redness or swelling around the IV site. Get help right away if:  You have trouble breathing.  You have new-onset confusion at home. Summary  After the procedure, it is common to feel sleepy, have impaired judgment, or feel nauseous if you eat too soon.  Rest after you get home. Know the things  you should not do after the procedure.  Follow the diet recommended by your health care provider and drink enough fluid to keep your urine pale yellow.  Get help right away if you have trouble breathing or new-onset confusion at home. This information is not intended to replace advice given to you by your health care provider. Make sure you discuss any questions you have with your health care provider. Document Revised: 06/14/2019 Document Reviewed: 01/10/2019 Elsevier Patient Education  2021 [employer].

## 2020-06-10 NOTE — Procedures (Signed)
Interventional Radiology Procedure Note  Procedure: US guided biopsy of liver for medical liver Complications: None EBL: None Recommendations: - Bedrest 2 hours.   - Routine wound care - Follow up pathology - Advance diet   Signed,  Corrie Mckusick, DO

## 2020-06-15 ENCOUNTER — Telehealth: Payer: Self-pay | Admitting: Internal Medicine

## 2020-06-15 NOTE — Telephone Encounter (Signed)
Pt is requesting a call back from a nurse to inquire about how long it takes to receive the [place] results.

## 2020-06-15 NOTE — Telephone Encounter (Signed)
Patient notified that the pathology is still pending from liver bx. Advised probable delay due to Easter holidays. She is advised that we will call as soon as the results are back and reviewed.

## 2020-06-16 ENCOUNTER — Ambulatory Visit (INDEPENDENT_AMBULATORY_CARE_PROVIDER_SITE_OTHER)
Admission: RE | Admit: 2020-06-16 | Discharge: 2020-06-16 | Disposition: A | Discharge status: Home / Self Care | Payer: Managed Care, Other (non HMO) | Source: Ambulatory Visit | Attending: Internal Medicine | Admitting: Internal Medicine

## 2020-06-16 ENCOUNTER — Telehealth: Payer: Self-pay | Admitting: Internal Medicine

## 2020-06-16 ENCOUNTER — Other Ambulatory Visit: Payer: Self-pay

## 2020-06-16 DIAGNOSIS — R0602 Shortness of breath: Secondary | ICD-10-CM

## 2020-06-16 LAB — SURGICAL PATHOLOGY

## 2020-06-16 NOTE — Telephone Encounter (Signed)
Inbound call from patient requesting a call from a nurse please.  She has been experiencing shortness of breath along with other symptoms after having liver biopsy.  Please advise.

## 2020-06-16 NOTE — Telephone Encounter (Signed)
Discussed with Dr. Carlean Purl. Patient to come for a PA and Lat CXR  - STAT Left message for patient to call back

## 2020-06-16 NOTE — Telephone Encounter (Signed)
Patient reports that she has had SOB that started yesterday.  She reports that she has some heaviness yesterday.  "I feel weird in my chest".  "I feel like I can't get enough air at times".  The feeling is always there, no real change with activity.   This feeling started yesterday.  She reports that she had some tingling and numbness in her left arm that started yesterday. Liver biopsy was 06/10/20.  Symptoms started yesterday after working for 6 hours.  Dr. Carlean Purl please review

## 2020-06-16 NOTE — Telephone Encounter (Signed)
Patient notified to come for CXR now

## 2020-06-18 ENCOUNTER — Encounter: Payer: Self-pay | Admitting: Internal Medicine

## 2020-06-18 ENCOUNTER — Telehealth: Payer: Self-pay | Admitting: Internal Medicine

## 2020-06-18 DIAGNOSIS — K8309 Other cholangitis: Secondary | ICD-10-CM | POA: Insufficient documentation

## 2020-06-18 MED ORDER — FERROUS SULFATE 325 (65 FE) MG PO TBEC: 325.0000 mg | DELAYED_RELEASE_TABLET | Freq: Every day | ORAL | 3 refills | Status: DC

## 2020-06-18 NOTE — Telephone Encounter (Signed)
I spoke to the patient about her biopsy results and the differential.  We need to schedule MRCP with and without contrast re: cholangitis question PSC  Also needs to start ferrous sulfate 325 mg daily for low ferritin and mild anemia  She has these dyspnea complaints and is seeing Inda Coke early next week reviewed those question if anemia is related I do not think it is from the biopsy but temporally it was chest x-ray was negative for pneumothorax she was reassured  Also set up follow-up with me for after MRI sometime in May please  Anticipate likely tertiary liver referral pending the above  Not discussed is if we think she has [place] then needs a flex sig versus colonoscopy to look for ulcerative colitis

## 2020-06-19 NOTE — Telephone Encounter (Signed)
Orders placed.  Message sent to the patient via [place] about follow up for 07/10/20 at 2:10

## 2020-06-22 ENCOUNTER — Ambulatory Visit (INDEPENDENT_AMBULATORY_CARE_PROVIDER_SITE_OTHER): Payer: Managed Care, Other (non HMO) | Admitting: Physician Assistant

## 2020-06-22 ENCOUNTER — Other Ambulatory Visit: Payer: Self-pay

## 2020-06-22 ENCOUNTER — Encounter: Payer: Self-pay | Admitting: Physician Assistant

## 2020-06-22 VITALS — BP 110/70 | HR 80 | Temp 98.1°F | Ht 60.75 in | Wt 131.2 lb

## 2020-06-22 DIAGNOSIS — E559 Vitamin D deficiency, unspecified: Secondary | ICD-10-CM

## 2020-06-22 DIAGNOSIS — D500 Iron deficiency anemia secondary to blood loss (chronic): Secondary | ICD-10-CM

## 2020-06-22 DIAGNOSIS — R200 Anesthesia of skin: Secondary | ICD-10-CM

## 2020-06-22 DIAGNOSIS — R202 Paresthesia of skin: Secondary | ICD-10-CM

## 2020-06-22 DIAGNOSIS — R0600 Dyspnea, unspecified: Secondary | ICD-10-CM | POA: Diagnosis not present

## 2020-06-22 LAB — URINALYSIS, ROUTINE W REFLEX MICROSCOPIC
Bilirubin Urine: NEGATIVE
Ketones, ur: NEGATIVE
Leukocytes,Ua: NEGATIVE
Nitrite: NEGATIVE
Specific Gravity, Urine: 1.025 (ref 1.000–1.030)
Total Protein, Urine: NEGATIVE
Urine Glucose: NEGATIVE
Urobilinogen, UA: 0.2 (ref 0.0–1.0)
WBC, UA: NONE SEEN (ref 0–?)
pH: 5.5 (ref 5.0–8.0)

## 2020-06-22 LAB — TSH: TSH: 1.13 u[IU]/mL (ref 0.35–4.50)

## 2020-06-22 LAB — VITAMIN D 25 HYDROXY (VIT D DEFICIENCY, FRACTURES): VITD: 18.21 ng/mL — ABNORMAL LOW (ref 30.00–100.00)

## 2020-06-22 LAB — HEMOGLOBIN A1C: Hgb A1c MFr Bld: 5.7 % (ref 4.6–6.5)

## 2020-06-22 LAB — T4, FREE: Free T4: 0.73 ng/dL (ref 0.60–1.60)

## 2020-06-22 NOTE — Patient Instructions (Signed)
It was great to see you!  EKG looks normal.  Let's update blood work today.  Start oral iron every other day, supplement of your choice.  I will be in touch with blood work results and my recommendations.  If you are having NEW symptoms such as chest pain, please go to the ER.  If symptoms do not continue to improve, I also need to know.  Take care,  Inda Coke PA-C

## 2020-06-22 NOTE — Progress Notes (Signed)
Alicia Carr is a 39 y.o. female is here for follow up.  I acted as a Education administrator for [employer], PA-C Anselmo Pickler, LPN   History of Present Illness:   Chief Complaint  Patient presents with  . Fatigue    HPI  Fatigue Pt c/o feeling very tired since having liver biopsy done on 06/10/20.  She is having an MRCP to further evaluate, this is going to be performed in early May.  She is having some ongoing fatigue, hands having numbness and tingling off and on. The numbness and tingling has been going on since before this procedure.  Can be in any of her extremities. Does not seem to have a trigger. Most recent B12 from 58 months age was WNL. Last episode was Thursday. Pt is not taking any iron at present.  Was recommended to start this by Dr. Carlean Purl, most recent CBC shows Hgb 10.1 and ferritin 4.1 from March 2022.   Feels like she is eating iron rich foods more so than ever. Periods last 7 days. Having large clots hourly for 3 days at the beginning of her period week. She has very painful periods. Unaware if she has fibroids or what the cause of heavy periods are, she has always had them. She does not donate blood.   Denies chest pain with her SOB, had some chest tightness at times. Does not necessary radiate to L side of chest but feels a pressure/tightness sensation. She does have history of low vitamin D that she would like evaluated. Denies: calf pain/tenderness, hx of clotting disorder, fevers, chills, nausea, vomiting, known hx of COVID, smoker  Wt Readings from Last 4 Encounters:  06/22/20 131 lb 4 oz (59.5 kg)  05/19/20 135 lb 2 oz (61.3 kg)  03/11/20 152 lb (68.9 kg)  01/01/20 168 lb 8 oz (76.4 kg)     Health Maintenance Due  Topic Date Due  . HIV Screening  Never done  . COVID-19 Vaccine (3 - Booster for [employer] series) 11/23/2019    Past Medical History:  Diagnosis Date  . Alcoholism ([place])   . Anemia   . Gallstones   . History of chicken pox   . Vaginal  delivery 2003     Social History   Tobacco Use  . Smoking status: Never Smoker  . Smokeless tobacco: Never Used  Vaping Use  . Vaping Use: Never used  Substance Use Topics  . Alcohol use: Not Currently    Comment: 2.5 per day x8 to 10 years stopped 01/18/2020  . Drug use: No    Past Surgical History:  Procedure Laterality Date  . CESAREAN SECTION  08/08/2006  . CHOLECYSTECTOMY  2003    Family History  Problem Relation Age of Onset  . Mental illness Mother        paranoid schizophrenia  . Hyperlipidemia Mother   . Alcohol abuse Father   . Diabetes Paternal Aunt   . Cancer Maternal Grandmother        uncertain type    PMHx, SurgHx, SocialHx, FamHx, Medications, and Allergies were reviewed in the Visit Navigator and updated as appropriate.   Patient Active Problem List   Diagnosis Date Noted  . 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 Obesity 11/29/2019    Social History   Tobacco Use  . Smoking status: Never Smoker  . Smokeless tobacco: Never Used  Vaping Use  . Vaping Use: Never used  Substance Use Topics  . Alcohol use: Not Currently  Comment: 2.5 per day x8 to 10 years stopped 01/18/2020  . Drug use: No    Current Medications and Allergies:    Current Outpatient Medications:  .  COLLAGEN PO, Take 1 Scoop by mouth 2 (two) times daily., Disp: , Rfl:  .  Tetrahydrozoline HCl (VISINE OP), Place 1 drop into both eyes daily as needed (redness)., Disp: , Rfl:  .  BIOTIN PO, Take 1 tablet by mouth daily. (Patient not taking: Reported on 06/22/2020), Disp: , Rfl:  .  ferrous sulfate 325 (65 FE) MG EC tablet, Take 1 tablet (325 mg total) by mouth daily with breakfast. (Patient not taking: Reported on 06/22/2020), Disp: , Rfl: 3  No Known Allergies  Review of Systems   ROS Negative unless otherwise specified per HPI.  Vitals:   Vitals:   06/22/20 1313  BP: 110/70  Pulse: 80  Temp: 98.1 F (36.7 C)  TempSrc: Temporal  SpO2: 100%  Weight: 131 lb 4 oz (59.5  kg)  Height: 5' 0.75" (1.543 m)     Body mass index is 25 kg/m.   Physical Exam:    Physical Exam Vitals and nursing note reviewed.  Constitutional:      General: She is not in acute distress.    Appearance: She is well-developed. She is not ill-appearing or toxic-appearing.  Cardiovascular:     Rate and Rhythm: Normal rate and regular rhythm.     Pulses: Normal pulses.     Heart sounds: Normal heart sounds, S1 normal and S2 normal.     Comments: No LE edema Pulmonary:     Effort: Pulmonary effort is normal.     Breath sounds: Normal breath sounds.  Abdominal:     General: Abdomen is flat. Bowel sounds are normal.     Palpations: Abdomen is soft.     Tenderness: There is no abdominal tenderness.     Comments: 1 mm well healing lesion to R upper quadrant without TTP or active discharge  Skin:    General: Skin is warm and dry.  Neurological:     General: No focal deficit present.     Mental Status: She is alert.     GCS: GCS eye subscore is 4. GCS verbal subscore is 5. GCS motor subscore is 6.     Cranial Nerves: Cranial nerves are intact.     Sensory: Sensation is intact.     Motor: Motor function is intact.     Coordination: Coordination is intact.     Gait: Gait is intact.  Psychiatric:        Speech: Speech normal.        Behavior: Behavior normal. Behavior is cooperative.      Assessment and Plan:    Alicia Carr was seen today for fatigue.  Diagnoses and all orders for this visit:  Dyspnea, unspecified type; Numbness and tingling EKG tracing is personally reviewed.  EKG notes NSR.  No acute changes.  Exam benign.  Update blood work today, as well as UA. DDx includes, but not limited to: iron deficiency, thyroid abnormality, deconditioning, anxiety, among others. We did review that she has some non-specific autoimmune markers. Cannot rule out underlying autoimmune issue with her symptoms, but she is undergoing work-up per specialists at this time. Worsening  precautions advised. Follow-up if symptoms do not continue to improve. -     EKG 12-Lead -     Urinalysis, Routine w reflex microscopic; Future -     Urinalysis, Routine w reflex microscopic  Iron deficiency anemia due to chronic blood loss Update Iron and TIBC panel. Recommend oral iron every other day, stressed importance of this. Consider f/u with gyn re: heavy periods. -     Iron, TIBC and Ferritin Panel  Vitamin D deficiency Update blood work today and provide recommendations accordingly. -     VITAMIN D 25 Hydroxy (Vit-D Deficiency, Fractures)   CMA or LPN served as scribe during this visit. History, Physical, and Plan performed by medical provider. The above documentation has been reviewed and is accurate and complete.  Inda Coke, PA-C [place], [place] 06/22/2020  Follow-up: No follow-ups on file.

## 2020-06-23 ENCOUNTER — Other Ambulatory Visit: Payer: Self-pay | Admitting: Physician Assistant

## 2020-06-23 LAB — IRON,TIBC AND FERRITIN PANEL
%SAT: 5 % (calc) — ABNORMAL LOW (ref 16–45)
Ferritin: 4 ng/mL — ABNORMAL LOW (ref 16–154)
Iron: 24 ug/dL — ABNORMAL LOW (ref 40–190)
TIBC: 472 mcg/dL (calc) — ABNORMAL HIGH (ref 250–450)

## 2020-06-23 MED ORDER — VITAMIN D (ERGOCALCIFEROL) 1.25 MG (50000 UNIT) PO CAPS: 50000.0000 [IU] | ORAL_CAPSULE | ORAL | 0 refills | Status: DC

## 2020-06-25 ENCOUNTER — Encounter: Payer: Self-pay | Admitting: Physician Assistant

## 2020-06-25 DIAGNOSIS — K8309 Other cholangitis: Secondary | ICD-10-CM

## 2020-06-26 NOTE — Telephone Encounter (Signed)
Reviewed with Alonza Bogus, PA .  Patient to come on Monday for LFTs.  She is advised to go to ED for worsening pain or fever over the weekend.

## 2020-06-29 ENCOUNTER — Other Ambulatory Visit (INDEPENDENT_AMBULATORY_CARE_PROVIDER_SITE_OTHER): Payer: Managed Care, Other (non HMO)

## 2020-06-29 DIAGNOSIS — K8309 Other cholangitis: Secondary | ICD-10-CM

## 2020-06-29 LAB — HEPATIC FUNCTION PANEL
ALT: 33 U/L (ref 0–35)
AST: 35 U/L (ref 0–37)
Albumin: 4.2 g/dL (ref 3.5–5.2)
Alkaline Phosphatase: 161 U/L — ABNORMAL HIGH (ref 39–117)
Bilirubin, Direct: 0 mg/dL (ref 0.0–0.3)
Total Bilirubin: 0.2 mg/dL (ref 0.2–1.2)
Total Protein: 7 g/dL (ref 6.0–8.3)

## 2020-06-30 ENCOUNTER — Ambulatory Visit (HOSPITAL_COMMUNITY): Payer: Managed Care, Other (non HMO)

## 2020-07-03 ENCOUNTER — Ambulatory Visit (HOSPITAL_COMMUNITY): Admission: RE | Admit: 2020-07-03 | Payer: Managed Care, Other (non HMO) | Source: Ambulatory Visit

## 2020-07-07 ENCOUNTER — Ambulatory Visit (HOSPITAL_COMMUNITY): Admission: RE | Admit: 2020-07-07 | Payer: Managed Care, Other (non HMO) | Source: Ambulatory Visit

## 2020-07-08 ENCOUNTER — Telehealth: Payer: Self-pay | Admitting: Internal Medicine

## 2020-07-08 NOTE — Telephone Encounter (Signed)
Patient contacted and advised that she will be contacted directly with [facility].  I spoke with [facility] this am and they confirmed they can now see the order and will contact the patient directly to schedule.

## 2020-07-10 ENCOUNTER — Ambulatory Visit: Payer: Managed Care, Other (non HMO) | Admitting: Internal Medicine

## 2020-07-18 ENCOUNTER — Ambulatory Visit
Admission: RE | Admit: 2020-07-18 | Discharge: 2020-07-18 | Disposition: A | Discharge status: Home / Self Care | Payer: Managed Care, Other (non HMO) | Source: Ambulatory Visit | Attending: Internal Medicine | Admitting: Internal Medicine

## 2020-07-18 DIAGNOSIS — K8309 Other cholangitis: Secondary | ICD-10-CM

## 2020-07-18 DIAGNOSIS — B029 Zoster without complications: Secondary | ICD-10-CM

## 2020-07-18 HISTORY — DX: Zoster without complications: B02.9

## 2020-07-18 MED ORDER — GADOBENATE DIMEGLUMINE 529 MG/ML IV SOLN
12.0000 mL | Freq: Once | INTRAVENOUS | Status: AC | PRN
  Administered 2020-07-18: 12 mL via INTRAVENOUS

## 2020-07-20 ENCOUNTER — Telehealth: Payer: Self-pay | Admitting: Internal Medicine

## 2020-07-20 NOTE — Telephone Encounter (Signed)
Patient notified okay to take Tylenol for her HA.  She is advised 2 Gm max a day.  HA not related to possible PSC

## 2020-07-20 NOTE — Telephone Encounter (Signed)
Inbound call from patient. States she have a a severe headache since her MRI on Saturday 5/21. She says she believe it could be a reaction from the contrast. Have 2 questions:  1. Need to know what medications are allowed to take due to not knowing what is going on with her liver 2. Wants to know could it be related to her current problems  Best contact number [phone]

## 2020-07-22 ENCOUNTER — Other Ambulatory Visit (INDEPENDENT_AMBULATORY_CARE_PROVIDER_SITE_OTHER): Payer: Managed Care, Other (non HMO)

## 2020-07-22 ENCOUNTER — Other Ambulatory Visit: Payer: Self-pay | Admitting: Internal Medicine

## 2020-07-22 DIAGNOSIS — K8309 Other cholangitis: Secondary | ICD-10-CM

## 2020-07-22 LAB — HEPATIC FUNCTION PANEL
ALT: 50 U/L — ABNORMAL HIGH (ref 0–35)
AST: 44 U/L — ABNORMAL HIGH (ref 0–37)
Albumin: 4.4 g/dL (ref 3.5–5.2)
Alkaline Phosphatase: 158 U/L — ABNORMAL HIGH (ref 39–117)
Bilirubin, Direct: 0 mg/dL (ref 0.0–0.3)
Total Bilirubin: 0.4 mg/dL (ref 0.2–1.2)
Total Protein: 7.3 g/dL (ref 6.0–8.3)

## 2020-07-23 ENCOUNTER — Ambulatory Visit: Payer: Managed Care, Other (non HMO) | Admitting: Family Medicine

## 2020-07-23 ENCOUNTER — Encounter: Payer: Self-pay | Admitting: Family Medicine

## 2020-07-23 ENCOUNTER — Other Ambulatory Visit: Payer: Managed Care, Other (non HMO)

## 2020-07-23 ENCOUNTER — Telehealth: Payer: Self-pay

## 2020-07-23 ENCOUNTER — Encounter: Payer: Self-pay | Admitting: Internal Medicine

## 2020-07-23 ENCOUNTER — Telehealth (INDEPENDENT_AMBULATORY_CARE_PROVIDER_SITE_OTHER): Payer: Managed Care, Other (non HMO) | Admitting: Family Medicine

## 2020-07-23 ENCOUNTER — Ambulatory Visit (INDEPENDENT_AMBULATORY_CARE_PROVIDER_SITE_OTHER): Payer: Managed Care, Other (non HMO) | Admitting: Internal Medicine

## 2020-07-23 VITALS — BP 110/70 | HR 92 | Ht 60.75 in | Wt 126.4 lb

## 2020-07-23 DIAGNOSIS — B028 Zoster with other complications: Secondary | ICD-10-CM | POA: Diagnosis not present

## 2020-07-23 DIAGNOSIS — K8309 Other cholangitis: Secondary | ICD-10-CM | POA: Diagnosis not present

## 2020-07-23 DIAGNOSIS — R519 Headache, unspecified: Secondary | ICD-10-CM

## 2020-07-23 DIAGNOSIS — M2550 Pain in unspecified joint: Secondary | ICD-10-CM

## 2020-07-23 DIAGNOSIS — R21 Rash and other nonspecific skin eruption: Secondary | ICD-10-CM

## 2020-07-23 MED ORDER — PREDNISONE 10 MG PO TABS: ORAL_TABLET | ORAL | 0 refills | Status: AC

## 2020-07-23 MED ORDER — VALACYCLOVIR HCL 1 G PO TABS: 1000.0000 mg | ORAL_TABLET | Freq: Three times a day (TID) | ORAL | 0 refills | Status: AC

## 2020-07-23 NOTE — Progress Notes (Signed)
Alicia Carr 40 y.o. 04/09/1980 [id number]  Assessment & Plan:   Encounter Diagnoses  Name Primary?  . 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 Herpes zoster with other complication     She seems to have some sort of autoimmune process, but its not clear to me how to categorize this.  Liver is not badly damaged at this point fortunately.  ANA positive at 1-80 but negative mitochondrial antibodies.  Further work-up as below question sarcoidosis doubt TB but I am going to check it look for immunity to hepatitis a and B in terms of possible vaccinations and additional autoimmune testing.  Referred to [place]  Orders Placed This Encounter  Procedures  . Angiotensin converting enzyme  . QuantiFERON-TB Gold Plus  . Hepatitis A Ab, Total  . Hepatitis B Surface AntiBODY  . Hepatitis B Core Antibody, total  . Anti-Smooth Muscle Antibody, IGG  . IgG   Regarding her herpes zoster I do not think it is ocular at this point though I fully agree with the eye evaluation later today.  Treatment as follows: Meds ordered this encounter  Medications  . valACYclovir (VALTREX) 1000 MG tablet    Sig: Take 1 tablet (1,000 mg total) by mouth 3 (three) times daily for 7 days.    Dispense:  21 tablet    Refill:  0  . predniSONE (DELTASONE) 10 MG tablet    Sig: Take 6 tablets (60 mg total) by mouth daily with breakfast for 2 days, THEN 4 tablets (40 mg total) daily with breakfast for 4 days, THEN 2 tablets (20 mg total) daily with breakfast for 4 days, THEN 1 tablet (10 mg total) daily with breakfast for 4 days.    Dispense:  40 tablet    Refill:  0   Information provided to the patient she is aware she should avoid contact with pregnant or immune nave patients regarding chickenpox  Further follow-up regarding zoster with primary care as appropriate  I appreciate the opportunity to care for you. Cc: Colin Benton DO [place] Locks, NP  Subjective:   Chief Complaint: Cholangitis  headache question shingles  HPI Patient presents for follow-up of cholangitis.  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  I had her do an MRCP which showed some hepatomegaly and also no evidence of PSC it seems.  She has been having headaches and had a rash pop out and had a virtual visit today and it sounds like she may have shingles with a vesicular eruption on her forehead and eyelid on the left.  She has been using Tylenol but she has been quite bothered by this pain.  These problems started about 5 days ago.  She was told she may need an urgent care in person evaluation, her provider is on maternity leave and she ended up with a virtual visit.  Her vision has not changed.  She has poor vision anyway she does have an eye doctor visit at 245 today.  I neglected to ask if that is an ophthalmologist versus an optometrist.  Recent Labs  Lab 07/22/20 1454  AST 44*  ALT 50*  ALKPHOS 158*  BILITOT 0.4  PROT 7.3  ALBUMIN 4.4    No Known Allergies Current Meds  Medication Sig  . COLLAGEN PO Take 1 Scoop by mouth 2 (two) times daily.  . ferrous  sulfate 325 (65 FE) MG EC tablet Take 1 tablet (325 mg total) by mouth daily with breakfast.  . Tetrahydrozoline HCl (VISINE OP) Place 1 drop into both eyes daily as needed (redness).  . Vitamin D, Ergocalciferol, (DRISDOL) 1.25 MG (50000 UNIT) CAPS capsule Take 1 capsule (50,000 Units total) by mouth every 7 (seven) days.   Past Medical History:  Diagnosis Date  . Alcoholism ([place])   . Anemia   . Gallstones   . History of chicken pox   . Vaginal delivery 2003   Past Surgical History:  Procedure Laterality Date  . CESAREAN SECTION  08/08/2006  . 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   Review of Systems As above  Objective:   Physical Exam BP 110/70 (BP Location: Left Arm, Patient Position: Sitting, Cuff Size: Normal)   Pulse 92   Ht 5' 0.75" (1.543 m)   Wt 126 lb 6 oz (57.3 kg)   LMP 07/17/2020 (Exact Date)   BMI 24.08 kg/m   Vesicular rash on the left forehead involving the left eyelid with some conjunctival injection in the eye.  No discharge.  Visual acuity grossly intact not formally tested.

## 2020-07-23 NOTE — Telephone Encounter (Signed)
I spoke with Bralyn and she said the eye Dr told her today there is a spot of concern in her left eye and they gave her a rx for eye drops to keep her eye dilated for 24 hours and she goes back Tues May 31st for a re-check.   She will come to our lab to get labs we ordered after she left. She is aware of the hours.

## 2020-07-23 NOTE — Progress Notes (Signed)
Virtual Visit via Video Note  I connected with Alicia Carr  on 07/23/20 at 10:00 AM EDT by a video enabled telemedicine application and verified that I am speaking with the correct person using two identifiers.  Location patient: home, [place] Location provider:work or home office Persons participating in the virtual visit: patient, provider  I discussed the limitations of evaluation and management by telemedicine and the availability of in person appointments. The patient expressed understanding and agreed to proceed.   HPI:  Acute telemedicine visit for headache and rash: -Onset: 5 days ago -Symptoms include: headache, severe at times and more on L side - L temporal initially and stabbing and now more around and behind the L eye and in the L ear, ear is popping at times and pain is mainly around the L ear and feels more when opens jaw, also having some joint pains in finger, elbows and knees - feels like this is worse than she has had in the past, also has a few skin lesions that have popped out on her L forehead and near her L eye - she feels like this is pustular almost looks like pimples but not like pimples she has had in the past -Denies: fevers, NVD, cough, resp symptoms, vision changes, hearing changes, weakness and numbness -Has tried:tylenol -Pertinent past medical history: has a history of stress headaches, random joint pains in the past -Pertinent medication allergies: No Known Allergies -COVID-19 vaccine status: vaccinated and boosted  ROS: See pertinent positives and negatives per HPI.  Past Medical History:  Diagnosis Date  . Alcoholism ([place])   . Anemia   . Gallstones   . History of chicken pox   . Vaginal delivery 2003    Past Surgical History:  Procedure Laterality Date  . CESAREAN SECTION  08/08/2006  . CHOLECYSTECTOMY  2003     Current Outpatient Medications:  .  BIOTIN PO, Take 1 tablet by mouth daily., Disp: , Rfl:  .  COLLAGEN PO, Take 1 Scoop by mouth 2 (two)  times daily., Disp: , Rfl:  .  ferrous sulfate 325 (65 FE) MG EC tablet, Take 1 tablet (325 mg total) by mouth daily with breakfast., Disp: , Rfl: 3 .  Tetrahydrozoline HCl (VISINE OP), Place 1 drop into both eyes daily as needed (redness)., Disp: , Rfl:  .  Vitamin D, Ergocalciferol, (DRISDOL) 1.25 MG (50000 UNIT) CAPS capsule, Take 1 capsule (50,000 Units total) by mouth every 7 (seven) days., Disp: 12 capsule, Rfl: 0  EXAM:  VITALS per patient if applicable:   GENERAL: alert, oriented, appears well and in no acute distress  HEENT: atraumatic, conjunttiva clear, PER, no obvious abnormalities on inspection of external nose and ears  NECK: normal movements of the head and neck  LUNGS: on inspection no signs of respiratory distress, breathing rate appears normal, no obvious gross SOB, gasping or wheezing  CV: no obvious cyanosis  SKIN: video/lighting quality is poor so I am not able to visual the characteristics of the rash she is reporting - to me I can see some possible papules on the L forehead faintly  MS: moves all visible extremities without noticeable abnormality  PSYCH/NEURO: pleasant and cooperative, no obvious depression or anxiety, speech and thought processing grossly intact  ASSESSMENT AND PLAN:  Discussed the following assessment and plan:  Acute intractable headache, unspecified headache type  Rash  Arthralgia, unspecified joint  -we discussed possible serious and likely etiologies, options for evaluation and workup, limitations of telemedicine visit vs in  person visit, treatment, treatment risks and precautions. Pt prefers to treat via telemedicine empirically rather than in person at this moment. However, given the degree of headache she if having, body aches and rash advised inperson evaluation as this could possible be shingles, ear infection, skin infection, viral infetion vs other.   Discussed options for inperson care if PCP office not available.  I  discussed the assessment and treatment plan with the patient. The patient was provided an opportunity to ask questions and all were answered. The patient agreed with the plan and demonstrated an understanding of the instructions.     Lucretia Kern, DO

## 2020-07-23 NOTE — Patient Instructions (Signed)
Please get started on the medications today.  Let me know what the eye doctor says.  We will refer you to the liver clinic - stay tuned for more info - they will contact you.  Let me know when you hear.  I appreciate the opportunity to care for you. Gatha Mayer, MD, [facility]

## 2020-07-23 NOTE — Patient Instructions (Signed)
Seek inperson evaluation today as we discussed.    I hope you are feeling better soon!   It was nice to meet you today. I help [facility] out with telemedicine visits on Tuesdays and Thursdays and am available for visits on those days. If you have any concerns or questions following this visit please schedule a follow up visit with your Primary Care doctor or seek care at a local urgent care clinic to avoid delays in care.

## 2020-07-24 ENCOUNTER — Encounter: Payer: Self-pay | Admitting: Internal Medicine

## 2020-07-24 ENCOUNTER — Encounter: Payer: Self-pay | Admitting: Family Medicine

## 2020-07-24 ENCOUNTER — Telehealth: Payer: Self-pay

## 2020-07-24 ENCOUNTER — Other Ambulatory Visit: Payer: Managed Care, Other (non HMO)

## 2020-07-24 DIAGNOSIS — K8309 Other cholangitis: Secondary | ICD-10-CM

## 2020-07-24 NOTE — Telephone Encounter (Signed)
Dr Carlean Purl did you speak with the pt about a work note for shingles?

## 2020-07-24 NOTE — Telephone Encounter (Signed)
Letter created and sent

## 2020-07-24 NOTE — Telephone Encounter (Signed)
Sir she would like a work note sent to her through [facility] Please.

## 2020-07-25 LAB — HEPATITIS B SURFACE ANTIBODY,QUALITATIVE: Hep B S Ab: NONREACTIVE

## 2020-07-25 LAB — ANGIOTENSIN CONVERTING ENZYME: Angiotensin-Converting Enzyme: 40 U/L (ref 9–67)

## 2020-07-25 LAB — QUANTIFERON-TB GOLD PLUS
Mitogen-NIL: >10 IU/mL
NIL: 0.08 IU/mL
QuantiFERON-TB Gold Plus: NEGATIVE
TB1-NIL: 0.02 IU/mL
TB2-NIL: 0.03 IU/mL

## 2020-07-25 LAB — HEPATITIS B CORE ANTIBODY, TOTAL: Hep B Core Total Ab: NONREACTIVE

## 2020-07-25 LAB — HEPATITIS A ANTIBODY, TOTAL: Hepatitis A AB,Total: NONREACTIVE

## 2020-07-28 ENCOUNTER — Encounter: Payer: Self-pay | Admitting: Family Medicine

## 2020-07-28 ENCOUNTER — Ambulatory Visit (INDEPENDENT_AMBULATORY_CARE_PROVIDER_SITE_OTHER): Payer: Managed Care, Other (non HMO) | Admitting: Family Medicine

## 2020-07-28 ENCOUNTER — Other Ambulatory Visit: Payer: Self-pay

## 2020-07-28 VITALS — BP 128/82 | HR 90 | Temp 98.5°F | Ht 60.75 in | Wt 129.6 lb

## 2020-07-28 DIAGNOSIS — H938X2 Other specified disorders of left ear: Secondary | ICD-10-CM | POA: Diagnosis not present

## 2020-07-28 DIAGNOSIS — B029 Zoster without complications: Secondary | ICD-10-CM

## 2020-07-28 MED ORDER — GABAPENTIN 100 MG PO CAPS: 100.0000 mg | ORAL_CAPSULE | Freq: Every day | ORAL | 3 refills | Status: DC

## 2020-07-28 NOTE — Patient Instructions (Signed)
It was very nice to see you today!  Please start the gabapentin.  This will help with your headaches.  Please continue taking 60 mg daily of prednisone until you run out.  Your hearing test today is normal.  Your symptoms should gradually improve over the next 1 to 2 weeks.  Please send a message in a few days to let me know how you are doing.  Take care, Dr Jerline Pain  PLEASE NOTE:  If you had any lab tests please let us know if you have not heard back within a few days. You may see your results on mychart before we have a chance to review them but we will give you a call once they are reviewed by [place]. If we ordered any referrals today, please let us know if you have not heard from their office within the next week.   Please try these tips to maintain a healthy lifestyle:   Eat at least 3 REAL meals and 1-2 snacks per day.  Aim for no more than 5 hours between eating.  If you eat breakfast, please do so within one hour of getting up.    Each meal should contain half fruits/vegetables, one quarter protein, and one quarter carbs (no bigger than a computer mouse)   Cut down on sweet beverages. This includes juice, soda, and sweet tea.     Drink at least 1 glass of water with each meal and aim for at least 8 glasses per day   Exercise at least 150 minutes every week.

## 2020-07-28 NOTE — Progress Notes (Signed)
   Alicia Carr is a 40 y.o. female who presents today for an office visit.  Assessment/Plan:  New/Acute Problems: Herpes zoster She has follow-up with ophthalmology-no signs of herpes asthmaticus.  She is already on treatment with Valtrex and prednisone.  Recommended patient stay on high-dose prednisone 60 mg daily until she completes her course of steroids.  Her hearing test today is normal.  She is likely having some herpetic neuralgia as a source of her headaches.  We will start low-dose gabapentin hopefully her neuralgia symptoms will improve over the next 1 to 2 weeks.  She will check with me in a couple days via MyChart.  We can increase dose of gabapentin as needed.  Discussed reasons to return to care.      Subjective:  HPI:  Patient here for shingles follow-up.  Was diagnosed about a week ago.  Started on Valtrex and prednisone 5 days ago.  At that time was having rash to left forehead and left upper eyelid.  She has seen her eye doctor.  She is also taking eyedrops.  She has had continued issues with headache and left ear fullness.  Occasional vertigo.  No obvious hearing loss.       Objective:  Physical Exam: LMP 07/17/2020 (Exact Date)   Gen: No acute distress, resting comfortably HEENT: Several scabbed lesions on left forehead.  Extraocular eye movements intact without pain.  Left TM with clear effusion.  No obvious vesicular lesions noted. Neuro: Grossly normal, moves all extremities Psych: Normal affect and thought content  Time Spent: 31 minutes of total time was spent on the date of the encounter performing the following actions: chart review prior to seeing the patient including her recent virtual visit and visit with GI, obtaining history, performing a medically necessary exam, counseling on the treatment plan, placing orders, and documenting in our EHR.       Algis Greenhouse. Jerline Pain, MD 07/28/2020 8:40 AM

## 2020-07-30 ENCOUNTER — Other Ambulatory Visit: Payer: Self-pay

## 2020-07-30 DIAGNOSIS — Z23 Encounter for immunization: Secondary | ICD-10-CM

## 2020-07-30 LAB — IGG: IgG (Immunoglobin G), Serum: 1101 mg/dL (ref 600–1640)

## 2020-07-30 LAB — ANTI-SMOOTH MUSCLE ANTIBODY, IGG: Actin (Smooth Muscle) Antibody (IGG): <20 U (ref <20)

## 2020-07-31 ENCOUNTER — Encounter: Payer: Self-pay | Admitting: Family Medicine

## 2020-08-04 ENCOUNTER — Other Ambulatory Visit: Payer: Self-pay | Admitting: *Deleted

## 2020-08-04 DIAGNOSIS — H9313 Tinnitus, bilateral: Secondary | ICD-10-CM

## 2020-08-12 ENCOUNTER — Telehealth: Payer: Self-pay | Admitting: Internal Medicine

## 2020-08-12 ENCOUNTER — Telehealth: Payer: Self-pay

## 2020-08-12 NOTE — Telephone Encounter (Signed)
Lvm for the pt to call the office back. Dr. Jerline Pain is already gone for the day. She can increase to 2 tabs today until we talk to Dr. Jerline Pain on tomorrow.

## 2020-08-12 NOTE — Telephone Encounter (Signed)
Pt says that she has been taking (2) 100 mg tabs daily. She says that she only has 7 tabs left. She was told she can increase it to 200 mg tabs. I assured her that she can continue to take the (2) 100 mg tabs, but I will need to get clarification on the dosage from Dr. Jerline Pain who has already left for the day. She is a little confused and does not understand what I am advising. I recommended that we call her back once talking to Dr. Jerline Pain about how to take medication.   Please clarify dosage instructions.  Thank you

## 2020-08-12 NOTE — Telephone Encounter (Signed)
Pt needed to move her twinrix appt out a few weeks. Pt has had shingles and they have come back in her eye and she is being placed on antivirals again. Appt moved to 7/12@8 :30am. Pt aware of appt.

## 2020-08-12 NOTE — Telephone Encounter (Signed)
Inbound call from pt requesting a call back regarding her HEP INJ. Please advise. Thanks

## 2020-08-12 NOTE — Telephone Encounter (Signed)
PATIENT STATES SHE WAS TOLD BY DR.PARKER TO TAKE 200 Mg INSTEAD OF 100 MG AND SHE IS NOW SHE IS NEEDING REFILL OR NEW RX THAT STATES SHE NEEDS 200MG        LAST APPOINTMENT DATE: 07/28/2020   NEXT APPOINTMENT DATE:@Visit  date not found  MEDICATION:gabapentin (NEURONTIN) 100 MG capsule  PHARMACY:[id number] - [place], [place] - [address] AT [address]

## 2020-08-13 ENCOUNTER — Other Ambulatory Visit: Payer: Self-pay | Admitting: Physician Assistant

## 2020-08-13 ENCOUNTER — Encounter: Payer: Self-pay | Admitting: Family Medicine

## 2020-08-13 ENCOUNTER — Other Ambulatory Visit: Payer: Self-pay

## 2020-08-13 MED ORDER — VITAMIN D (ERGOCALCIFEROL) 1.25 MG (50000 UNIT) PO CAPS: 50000.0000 [IU] | ORAL_CAPSULE | ORAL | 0 refills | Status: DC

## 2020-08-13 MED ORDER — GABAPENTIN 100 MG PO CAPS: 200.0000 mg | ORAL_CAPSULE | Freq: Every day | ORAL | 1 refills | Status: DC

## 2020-08-13 NOTE — Telephone Encounter (Signed)
Ok to increase to 200mg  nightly. Please send in new rx.  Algis Greenhouse. Jerline Pain, MD 08/13/2020 8:08 AM

## 2020-08-13 NOTE — Telephone Encounter (Signed)
Rx sent in

## 2020-08-13 NOTE — Telephone Encounter (Signed)
Spoke with patient stated issue was resolved, if any problems will call back

## 2020-08-24 ENCOUNTER — Telehealth: Payer: Self-pay | Admitting: Internal Medicine

## 2020-08-24 NOTE — Telephone Encounter (Signed)
Inbound call from patient. States she have had urgency to use restroom but nothing coming out. Also, have been having discomfort in her left abdomen. Wonders if it can be because she recently had shingles. Best contact number [phone]

## 2020-08-25 ENCOUNTER — Other Ambulatory Visit: Payer: Self-pay

## 2020-08-25 ENCOUNTER — Ambulatory Visit (INDEPENDENT_AMBULATORY_CARE_PROVIDER_SITE_OTHER): Payer: Managed Care, Other (non HMO) | Admitting: Registered Nurse

## 2020-08-25 ENCOUNTER — Encounter: Payer: Self-pay | Admitting: Registered Nurse

## 2020-08-25 VITALS — BP 123/82 | HR 94 | Temp 98.5°F | Resp 18 | Ht 60.75 in | Wt 126.6 lb

## 2020-08-25 DIAGNOSIS — R202 Paresthesia of skin: Secondary | ICD-10-CM | POA: Diagnosis not present

## 2020-08-25 DIAGNOSIS — Z8619 Personal history of other infectious and parasitic diseases: Secondary | ICD-10-CM | POA: Diagnosis not present

## 2020-08-25 DIAGNOSIS — R768 Other specified abnormal immunological findings in serum: Secondary | ICD-10-CM

## 2020-08-25 DIAGNOSIS — M6281 Muscle weakness (generalized): Secondary | ICD-10-CM | POA: Diagnosis not present

## 2020-08-25 DIAGNOSIS — R292 Abnormal reflex: Secondary | ICD-10-CM

## 2020-08-25 DIAGNOSIS — M2550 Pain in unspecified joint: Secondary | ICD-10-CM

## 2020-08-25 MED ORDER — DICLOFENAC POTASSIUM 50 MG PO TABS: 50.0000 mg | ORAL_TABLET | Freq: Two times a day (BID) | ORAL | 0 refills | Status: DC | PRN

## 2020-08-25 NOTE — Telephone Encounter (Signed)
Patient reports a fullness in her LUQ.  She feels that this is an inflammation and she has joint pains.  She wants to know if you feel her spleen can be enlarged?  Could this have anything to do with the recent shingles? She states that the LUQ fullness has been there for several weeks and is not improving.  She feels that she is having "an inflammatory reaction in her body".  She has an appointment with her PCP today and is going to discuss both the LUQ fullness and discomfort as well as the joint pains.  She wants to get your opinion on her symptoms.

## 2020-08-25 NOTE — Patient Instructions (Addendum)
[name] -  Sorry that this is happening to you! Let's start with labs and antiinflammatories to investigate. In addition, I will refer you to rheumatology and neurology. Given the development of symptoms and progression, i'll try to make sure these happen sooner rather than later. Expect a call in the coming days.  If new symptoms arise, let me know  I'll be in touch with lab results in the next day or two  Thank you  Rich     If you have lab work done today you will be contacted with your lab results within the next 2 weeks.  If you have not heard from [place] then please contact us. The fastest way to get your results is to register for My Chart.   IF you received an x-ray today, you will receive an invoice from [facility]. Please contact [facility] at [phone] with questions or concerns regarding your invoice.   IF you received labwork today, you will receive an invoice from [facility]. Please contact [facility] at [phone] with questions or concerns regarding your invoice.   Our billing staff will not be able to assist you with questions regarding bills from these companies.  You will be contacted with the lab results as soon as they are available. The fastest way to get your results is to activate your My Chart account. Instructions are located on the last page of this paperwork. If you have not heard from [place] regarding the results in 2 weeks, please contact this office.

## 2020-08-25 NOTE — Progress Notes (Signed)
Established Patient Office Visit  Subjective:  Patient ID: Alicia Carr, female    DOB: 1981-02-13  Age: 40 y.o. MRN: [id number]  CC:  Chief Complaint  Patient presents with   Joint Pain    Patient states she has been having joint pain. Patient states its been several weeks from her having shingles a few weeks ago . Patient states she is having trouble and typing from left side. She also thinks its something to do with her nerves    HPI Alicia Carr presents for joint pain.  Progressing over past 2-3 weeks Sporadic through multiple joints - small joints in hands, sometimes knee L>R, other times ankles or hips.  Some weakness in LUE, trouble typing and holding phone. R hand dominant.  She does report a feeling of "fullness" in LUQ. No pain. No tenderness. No distension. She has been experiencing a feeling of fecal urgency without passing bowel movement in those moments, but then when she does end up having a BM, she notes they have been softer/looser, but not watery.  Of note, recent shingles infection with ophthalmic and otic involvement. Has been managed by Dr. Jerline Pain at [facility] office. Prednisone, antivirals, and now gabapentin for ongoing neuralgia tolerated well, cutaneous lesions resolving, pain well controlled, symptoms of initial outbreak improving: tinnitus stable to improving, eye pain improved, pain and itching improved.  Otherwise, had been in process of being worked up for autoimmune processes in preceding months. Positive ANA with 1:80 titer, homogenous speckled patter on IF. She has an unexplained cholangitis confirmed with gastro Dr. Carlean Purl. Otherwise, testing has been largely inconclusive on underlying cause. Has not seen Rheumatology as of yet.   Besides this recent run of symptoms in previous months, she has generally been healthy through her life with some history of anemia, alcohol abuse, and gallstones - none are currently active problems.  No  further concerns at this time. Is notably anxious about her myriad of symptoms.  Past Medical History:  Diagnosis Date   Alcoholism ([place])    Anemia    Gallstones    History of chicken pox    Shingles    Vaginal delivery 2003    Past Surgical History:  Procedure Laterality Date   CESAREAN SECTION  08/08/2006   CHOLECYSTECTOMY  2003    Family History  Problem Relation Age of Onset   Mental illness Mother        paranoid schizophrenia   Hyperlipidemia Mother    Alcohol abuse Father    Diabetes Paternal Aunt    Cancer Maternal Grandmother        uncertain type    Social History   Socioeconomic History   Marital status: Married    Spouse name: Not on file   Number of children: 2   Years of education: Not on file   Highest education level: Not on file  Occupational History   Occupation: Field seismologist  Tobacco Use   Smoking status: Never   Smokeless tobacco: Never  Vaping Use   Vaping Use: Never used  Substance and Sexual Activity   Alcohol use: Not Currently    Comment: 2.5 per day x8 to 10 years stopped 01/18/2020   Drug use: No   Sexual activity: Yes    Birth control/protection: None  Other Topics Concern   Not on file  Social History Narrative   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     ferrous sulfate 325 (65 FE) MG EC tablet Take 1  tablet (325 mg total) by mouth daily with breakfast.  3   gabapentin (NEURONTIN) 100 MG capsule Take 2 capsules (200 mg total) by mouth at bedtime. 180 capsule 1   Vitamin D, Ergocalciferol, (DRISDOL) 1.25 MG (50000 UNIT) CAPS capsule Take 1 capsule (50,000 Units total) by mouth every 7 (seven) days. 12 capsule 0   BIOTIN PO Take 1 tablet by mouth daily. (Patient not taking: No sig reported)     gabapentin (NEURONTIN) 100 MG capsule Take 1 capsule (100 mg total) by mouth at bedtime. 90 capsule 3   Tetrahydrozoline HCl (VISINE OP) Place 1 drop into both eyes daily as needed (redness). (Patient not taking: Reported on 07/28/2020)     No facility-administered medications prior to visit.    Not on File  ROS Review of Systems  Constitutional: Negative.  Negative for activity change and unexpected weight change.  HENT: Negative.    Eyes: Negative.   Respiratory: Negative.  Negative for cough and shortness of breath.   Cardiovascular: Negative.  Negative for chest pain, palpitations and leg swelling.  Gastrointestinal:  Positive for abdominal pain ("fullness" rather than pain in ULQ") and diarrhea (loose stool, not watery. fecal urgency). Negative for abdominal distention, anal bleeding, blood in stool, constipation, nausea, rectal pain and vomiting.  Endocrine: Negative.   Genitourinary: Negative.  Negative for decreased urine volume, difficulty urinating, flank pain, hematuria, menstrual problem, urgency, vaginal bleeding, vaginal discharge and vaginal pain.  Musculoskeletal:  Positive for arthralgias. Negative for back pain, gait problem, joint swelling, myalgias, neck pain and neck stiffness.  Skin:  Positive for rash (zoster, resolving). Negative for color change, pallor and wound.  Allergic/Immunologic: Negative.   Neurological:  Positive for weakness (LUE).  Hematological: Negative.   Psychiatric/Behavioral:  Negative for agitation, behavioral problems, confusion, decreased concentration,  dysphoric mood, hallucinations, self-injury, sleep disturbance and suicidal ideas. The patient is nervous/anxious. The patient is not hyperactive.   All other systems reviewed and are negative.    Objective:    Physical Exam Vitals and nursing note reviewed.  Constitutional:      General: She is not in acute distress.    Appearance: Normal appearance. She is normal weight. She is not ill-appearing, toxic-appearing or diaphoretic.  HENT:     Head: Normocephalic and atraumatic.  Cardiovascular:     Rate and Rhythm: Normal rate and regular rhythm.     Heart sounds: Normal heart sounds. No murmur heard.   No friction rub. No gallop.  Pulmonary:     Effort: Pulmonary effort is normal. No respiratory distress.     Breath sounds: Normal breath sounds. No stridor. No wheezing, rhonchi or rales.  Chest:     Chest wall: No tenderness.  Abdominal:  General: Abdomen is flat. Bowel sounds are normal. There is no distension.     Palpations: There is no mass.     Tenderness: There is no abdominal tenderness. There is guarding (very mild ULQ). There is no right CVA tenderness, left CVA tenderness or rebound.     Hernia: No hernia is present.  Musculoskeletal:        General: No swelling, tenderness, deformity or signs of injury. Normal range of motion.     Right lower leg: No edema.     Left lower leg: No edema.  Skin:    General: Skin is warm and dry.     Capillary Refill: Capillary refill takes less than 2 seconds.  Neurological:     General: No focal deficit present.     Mental Status: She is alert and oriented to person, place, and time. Mental status is at baseline.     Cranial Nerves: No cranial nerve deficit or facial asymmetry.     Sensory: No sensory deficit.     Motor: Weakness (mild LUE, 4/5) present.     Coordination: Coordination is intact. Coordination normal.     Gait: Gait is intact. Gait normal.     Deep Tendon Reflexes: Reflexes abnormal.     Reflex Scores:       Brachioradialis reflexes are 2+ on the right side and 2+ on the left side.      Patellar reflexes are 1+ on the right side and 1+ on the left side.      Achilles reflexes are 1+ on the right side and 1+ on the left side. Psychiatric:        Mood and Affect: Mood normal.        Behavior: Behavior normal.        Thought Content: Thought content normal.        Judgment: Judgment normal.    BP 123/82   Pulse 94   Temp 98.5 F (36.9 C) (Temporal)   Resp 18   Ht 5' 0.75" (1.543 m)   Wt 126 lb 9.6 oz (57.4 kg)   SpO2 98%   BMI 24.12 kg/m  Wt Readings from Last 3 Encounters:  08/25/20 126 lb 9.6 oz (57.4 kg)  07/28/20 129 lb 9.6 oz (58.8 kg)  07/23/20 126 lb 6 oz (57.3 kg)     There are no preventive care reminders to display for this patient.  There are no preventive care reminders to display for this patient.  Lab Results  Component Value Date   TSH 1.13 06/22/2020   Lab Results  Component Value Date   WBC 6.0 06/10/2020   HGB 10.1 (L) 06/10/2020   HCT 32.7 (L) 06/10/2020   MCV 77.1 (L) 06/10/2020   PLT 319 06/10/2020   Lab Results  Component Value Date   NA 137 12/02/2019   K 4.3 12/02/2019   CO2 24 12/02/2019   GLUCOSE 81 12/02/2019   BUN 13 12/02/2019   CREATININE 0.82 12/02/2019   BILITOT 0.4 07/22/2020   ALKPHOS 158 (H) 07/22/2020   AST 44 (H) 07/22/2020   ALT 50 (H) 07/22/2020   PROT 7.3 07/22/2020   ALBUMIN 4.4 07/22/2020   CALCIUM 9.0 12/02/2019   Lab Results  Component Value Date   CHOL 218 (H) 12/02/2019   Lab Results  Component Value Date   HDL 58 12/02/2019   Lab Results  Component Value Date   LDLCALC 136 (H) 12/02/2019   Lab Results  Component Value Date  TRIG 118 12/02/2019   Lab Results  Component Value Date   CHOLHDL 3.8 12/02/2019   Lab Results  Component Value Date   HGBA1C 5.7 06/22/2020      Assessment & Plan:   Problem List Items Addressed This Visit   None Visit Diagnoses     Paresthesia    -  Primary    Relevant Orders   CBC with Differential/Platelet   Comprehensive metabolic panel   TSH   C-reactive protein   Sedimentation rate   Vitamin D (25 hydroxy)   B12 and Folate Panel   Muscle weakness       Relevant Orders   CBC with Differential/Platelet   Comprehensive metabolic panel   TSH   C-reactive protein   Sedimentation rate   Vitamin D (25 hydroxy)   B12 and Folate Panel   Ambulatory referral to Neurology   Ambulatory referral to Rheumatology   Positive ANA (antinuclear antibody)       Relevant Orders   CBC with Differential/Platelet   Comprehensive metabolic panel   TSH   C-reactive protein   Sedimentation rate   Vitamin D (25 hydroxy)   B12 and Folate Panel   Ambulatory referral to Neurology   Ambulatory referral to Rheumatology   History of shingles       Relevant Orders   CBC with Differential/Platelet   Comprehensive metabolic panel   TSH   C-reactive protein   Sedimentation rate   Vitamin D (25 hydroxy)   B12 and Folate Panel   Abnormal deep tendon reflex       Relevant Orders   CBC with Differential/Platelet   Comprehensive metabolic panel   TSH   C-reactive protein   Sedimentation rate   Vitamin D (25 hydroxy)   B12 and Folate Panel   Ambulatory referral to Neurology   Polyarthralgia       Relevant Medications   diclofenac (CATAFLAM) 50 MG tablet       Meds ordered this encounter  Medications   diclofenac (CATAFLAM) 50 MG tablet    Sig: Take 1 tablet (50 mg total) by mouth 2 (two) times daily as needed (joint aches).    Dispense:  60 tablet    Refill:  0    Order Specific Question:   Supervising Provider    Answer:   Carlota Raspberry, JEFFREY R [[id number]]    Follow-up: No follow-ups on file.   PLAN Alicia Carr has an interesting constellation of symptoms that may be related to an autoimmune process or uncommon complication of recent zoster infection. Could include Roseanne Kaufman, stroke mimic with vasculitis, RA, extrapulmonary sarcoidosis, among  others.  Will CC Drs. Carlean Purl and Jerline Pain on today's visit to get their thoughts, as well as Inda Coke, PA-C, to get their thoughts on this case. Labs collected. Will follow up with the patient as warranted. Refer to Neuro and Rheum for assessment. Will aim for first available appointments with these symptoms progressing in past 2-3 weeks.  Diclofenac 1-2 times daily for joint pain. Low threshold for side effects to stop taking this medication and call office. Discussed nonpharm with pt at length, she voices understanding Patient encouraged to call clinic with any questions, comments, or concerns.  I spent 42 minutes with this patient, more than 50% of which was spent counseling and/or educating.  Maximiano Coss, NP

## 2020-08-26 ENCOUNTER — Encounter: Payer: Self-pay | Admitting: Registered Nurse

## 2020-08-26 LAB — COMPREHENSIVE METABOLIC PANEL
ALT: 28 U/L (ref 0–35)
AST: 34 U/L (ref 0–37)
Albumin: 4.4 g/dL (ref 3.5–5.2)
Alkaline Phosphatase: 141 U/L — ABNORMAL HIGH (ref 39–117)
BUN: 20 mg/dL (ref 6–23)
CO2: 29 mEq/L (ref 19–32)
Calcium: 9.2 mg/dL (ref 8.4–10.5)
Chloride: 102 mEq/L (ref 96–112)
Creatinine, Ser: 0.87 mg/dL (ref 0.40–1.20)
GFR: 84.31 mL/min (ref >60.00)
Glucose, Bld: 84 mg/dL (ref 70–99)
Potassium: 3.7 mEq/L (ref 3.5–5.1)
Sodium: 138 mEq/L (ref 135–145)
Total Bilirubin: 0.2 mg/dL (ref 0.2–1.2)
Total Protein: 7.4 g/dL (ref 6.0–8.3)

## 2020-08-26 LAB — CBC WITH DIFFERENTIAL/PLATELET
Basophils Absolute: 0.1 10*3/uL (ref 0.0–0.1)
Basophils Relative: 0.8 % (ref 0.0–3.0)
Eosinophils Absolute: 0 10*3/uL (ref 0.0–0.7)
Eosinophils Relative: 0.6 % (ref 0.0–5.0)
HCT: 34.5 % — ABNORMAL LOW (ref 36.0–46.0)
Hemoglobin: 11.2 g/dL — ABNORMAL LOW (ref 12.0–15.0)
Lymphocytes Relative: 29.6 % (ref 12.0–46.0)
Lymphs Abs: 2 10*3/uL (ref 0.7–4.0)
MCHC: 32.5 g/dL (ref 30.0–36.0)
MCV: 77.9 fl — ABNORMAL LOW (ref 78.0–100.0)
Monocytes Absolute: 0.4 10*3/uL (ref 0.1–1.0)
Monocytes Relative: 6.1 % (ref 3.0–12.0)
Neutro Abs: 4.3 10*3/uL (ref 1.4–7.7)
Neutrophils Relative %: 62.9 % (ref 43.0–77.0)
Platelets: 356 10*3/uL (ref 150.0–400.0)
RBC: 4.43 Mil/uL (ref 3.87–5.11)
RDW: 20.7 % — ABNORMAL HIGH (ref 11.5–15.5)
WBC: 6.8 10*3/uL (ref 4.0–10.5)

## 2020-08-26 LAB — B12 AND FOLATE PANEL
Folate: 20.1 ng/mL (ref >5.9)
Vitamin B-12: 533 pg/mL (ref 211–911)

## 2020-08-26 LAB — SEDIMENTATION RATE: Sed Rate: 48 mm/hr — ABNORMAL HIGH (ref 0–20)

## 2020-08-26 LAB — TSH: TSH: 1.13 u[IU]/mL (ref 0.35–4.50)

## 2020-08-26 LAB — VITAMIN D 25 HYDROXY (VIT D DEFICIENCY, FRACTURES): VITD: 50.15 ng/mL (ref 30.00–100.00)

## 2020-08-26 LAB — C-REACTIVE PROTEIN: CRP: <1 mg/dL (ref 0.5–20.0)

## 2020-08-26 NOTE — Telephone Encounter (Signed)
I left a message with the patient.  I did let her know that her spleen is not enlarged or was not on May MRI.  I will try to call her back or she can call me or make a MyChart message back to me

## 2020-08-29 ENCOUNTER — Encounter: Payer: Self-pay | Admitting: Registered Nurse

## 2020-09-01 ENCOUNTER — Ambulatory Visit (INDEPENDENT_AMBULATORY_CARE_PROVIDER_SITE_OTHER): Payer: Managed Care, Other (non HMO) | Admitting: Diagnostic Neuroimaging

## 2020-09-01 ENCOUNTER — Encounter: Payer: Self-pay | Admitting: Diagnostic Neuroimaging

## 2020-09-01 VITALS — BP 109/73 | HR 94 | Ht 60.75 in | Wt 126.0 lb

## 2020-09-01 DIAGNOSIS — R200 Anesthesia of skin: Secondary | ICD-10-CM | POA: Diagnosis not present

## 2020-09-01 DIAGNOSIS — R413 Other amnesia: Secondary | ICD-10-CM | POA: Diagnosis not present

## 2020-09-01 DIAGNOSIS — R202 Paresthesia of skin: Secondary | ICD-10-CM | POA: Diagnosis not present

## 2020-09-01 NOTE — Progress Notes (Signed)
[facility]  [name] DOB: 1980/08/14  REFERRING CLINICIAN: Maximiano Coss, NP HISTORY FROM: patient  REASON FOR VISIT: new consult    HISTORICAL  CHIEF COMPLAINT:  Chief Complaint  Patient presents with   Muscle weakness, Pos ANA    Rm 6 New Pt "have been referred to rheumatology and liver clinic to checked for autoimmune disease; can't move my toes/fingers, they go numb/tingle/twitch, arm/hand weakness at least since May 2022"    HISTORY OF PRESENT ILLNESS:   40 year old female here for evaluation of numbness tingling, twitching.  Jul 18, 2020 patient had onset of throbbing headache and sensitive to light.  Within a few days she developed rash over her left forehead and left periorbital region.  She was diagnosed with shingles and treated with prednisone and antivirals.  Over the next few days she developed numbness and tingling in her hands, feet, twitching in the muscles, decreased tone strength, decreased grip strength.  Over the past week her symptoms have worsened.  Patient had been having some issues with memory loss of brain fog even before shingles attack.  She has had some abnormal vision changes.   REVIEW OF SYSTEMS: Full 14 system review of systems performed and negative with exception of: as per HPI.   ALLERGIES: No Known Allergies  HOME MEDICATIONS: Outpatient Medications Prior to Visit  Medication Sig Dispense Refill   ferrous sulfate 325 (65 FE) MG EC tablet Take 1 tablet (325 mg total) by mouth daily with breakfast.  3   gabapentin (NEURONTIN) 100 MG capsule Take 2 capsules (200 mg total) by mouth at bedtime. 180 capsule 1   prednisoLONE acetate (PRED FORTE) 1 % ophthalmic suspension SMARTSIG:In Eye(s)     Vitamin D, Ergocalciferol, (DRISDOL) 1.25 MG (50000 UNIT) CAPS capsule Take 1 capsule (50,000 Units total) by mouth every 7 (seven) days. 12 capsule 0   BIOTIN PO Take 1 tablet by mouth daily. (Patient not taking: No  sig reported)     COLLAGEN PO Take 1 Scoop by mouth 2 (two) times daily. (Patient not taking: Reported on 09/01/2020)     diclofenac (CATAFLAM) 50 MG tablet Take 1 tablet (50 mg total) by mouth 2 (two) times daily as needed (joint aches). (Patient not taking: Reported on 09/01/2020) 60 tablet 0   gabapentin (NEURONTIN) 100 MG capsule Take 1 capsule (100 mg total) by mouth at bedtime. 90 capsule 3   Tetrahydrozoline HCl (VISINE OP) Place 1 drop into both eyes daily as needed (redness). (Patient not taking: Reported on 07/28/2020)     No facility-administered medications prior to visit.    PAST MEDICAL HISTORY: Past Medical History:  Diagnosis Date   Alcoholism ([place])    Anemia    Gallstones    History of chicken pox    Shingles    Shingles 07/18/2020   Vaginal delivery 2003    PAST SURGICAL HISTORY: Past Surgical History:  Procedure Laterality Date   CESAREAN SECTION  08/08/2006   CHOLECYSTECTOMY  2003    FAMILY HISTORY: Family History  Problem Relation Age of Onset   Mental illness Mother        paranoid schizophrenia   Hyperlipidemia Mother    Alcohol abuse Father    Diabetes Paternal Aunt    Cancer Maternal Grandmother        uncertain type    SOCIAL HISTORY: Social History   Socioeconomic History   Marital status: Married    Spouse name: Merrilee Seashore   Number of children:  2   Years of education: Not on file   Highest education level: Bachelor's degree (e.g., BA, AB, BS)  Occupational History   Occupation: Field seismologist  Tobacco Use   Smoking status: Never   Smokeless tobacco: Never  Vaping Use   Vaping Use: Never used  Substance and Sexual Activity   Alcohol use: Not Currently    Comment: 2.5 per day x8 to 10 years stopped 01/18/2020   Drug use: No   Sexual activity: Yes    Birth control/protection: None  Other Topics Concern   Not on file  Social History Narrative   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, [name]   1 son born  2003 1 daughter born 2008   Alcohol use regular for 10+ years stopped 01/18/2020 was 2 to 3 glasses of wine daily   No drug use   Coffee and tea 2 or 3 a day for caffeine   Social Determinants of Radio broadcast assistant Strain: Not on file  Food Insecurity: Not on file  Transportation Needs: Not on file  Physical Activity: Not on file  Stress: Not on file  Social Connections: Not on file  Intimate Partner Violence: Not on file     PHYSICAL EXAM  GENERAL EXAM/CONSTITUTIONAL: Vitals:  Vitals:   09/01/20 0848  BP: 109/73  Pulse: 94  Weight: 126 lb (57.2 kg)  Height: 5' 0.75" (1.543 m)   Body mass index is 24 kg/m. Wt Readings from Last 3 Encounters:  09/01/20 126 lb (57.2 kg)  08/25/20 126 lb 9.6 oz (57.4 kg)  07/28/20 129 lb 9.6 oz (58.8 kg)   Patient is in no distress; well developed, nourished and groomed; neck is supple  CARDIOVASCULAR: Examination of carotid arteries is normal; no carotid bruits Regular rate and rhythm, no murmurs Examination of peripheral vascular system by observation and palpation is normal  EYES: Ophthalmoscopic exam of optic discs and posterior segments is normal; no papilledema or hemorrhages No results found.  MUSCULOSKELETAL: Gait, strength, tone, movements noted in Neurologic exam below  NEUROLOGIC: MENTAL STATUS:  No flowsheet data found. awake, alert, oriented to person, place and time recent and remote memory intact normal attention and concentration language fluent, comprehension intact, naming intact fund of knowledge appropriate  CRANIAL NERVE:  2nd - no papilledema on fundoscopic exam 2nd, 3rd, 4th, 6th - pupils equal and reactive to light, visual fields full to confrontation, extraocular muscles intact, no nystagmus 5th - facial sensation symmetric 7th - facial strength symmetric 8th - hearing intact 9th - palate elevates symmetrically, uvula midline 11th - shoulder shrug symmetric 12th - tongue protrusion  midline  MOTOR:  normal bulk and tone, full strength in the BUE, BLE  SENSORY:  normal and symmetric to light touch, pinprick, temperature, vibration; EXCEPT DECR PP IN FEET  COORDINATION:  finger-nose-finger, fine finger movements normal  REFLEXES:  deep tendon reflexes present and symmetric  GAIT/STATION:  narrow based gait; romberg is negative     DIAGNOSTIC DATA (LABS, IMAGING, TESTING) - I reviewed patient records, labs, notes, testing and imaging myself where available.  Lab Results  Component Value Date   WBC 6.8 08/25/2020   HGB 11.2 (L) 08/25/2020   HCT 34.5 (L) 08/25/2020   MCV 77.9 (L) 08/25/2020   PLT 356.0 08/25/2020      Component Value Date/Time   NA 138 08/25/2020 1607   NA 144 08/24/2016 0900   K 3.7 08/25/2020 1607   CL 102  08/25/2020 1607   CO2 29 08/25/2020 1607   GLUCOSE 84 08/25/2020 1607   BUN 20 08/25/2020 1607   BUN 17 08/24/2016 0900   CREATININE 0.87 08/25/2020 1607   CREATININE 0.82 12/02/2019 0823   CALCIUM 9.2 08/25/2020 1607   PROT 7.4 08/25/2020 1607   PROT 7.3 12/21/2016 1222   ALBUMIN 4.4 08/25/2020 1607   ALBUMIN 4.5 12/21/2016 1222   AST 34 08/25/2020 1607   ALT 28 08/25/2020 1607   ALKPHOS 141 (H) 08/25/2020 1607   BILITOT 0.2 08/25/2020 1607   BILITOT <0.2 12/21/2016 1222   GFRNONAA 83 08/24/2016 0900   GFRAA 95 08/24/2016 0900   Lab Results  Component Value Date   CHOL 218 (H) 12/02/2019   HDL 58 12/02/2019   LDLCALC 136 (H) 12/02/2019   TRIG 118 12/02/2019   CHOLHDL 3.8 12/02/2019   Lab Results  Component Value Date   HGBA1C 5.7 06/22/2020   Lab Results  Component Value Date   [id number] 814 08/25/2020   Lab Results  Component Value Date   TSH 1.13 08/25/2020       ASSESSMENT AND PLAN  40 y.o. year old female here with numbness, twitching, brain fog, vision changes before and after shingles attack in May 2022.  Dx:  1. Numbness and tingling   2. Memory loss       PLAN:  NUMBNESS /  TWITCHING / BRAIN FOG / VISUAL CHANGES - check MRI brain w/wo (rule out CNS demyelinating dz) - check EMG/NCS (rule out GBS)  Orders Placed This Encounter  Procedures   MR BRAIN W WO CONTRAST   NCV with EMG(electromyography)   Return for for NCV/EMG, pending if symptoms worsen or fail to improve, pending test results.    Penni Bombard, MD 06/06/1854, 3:14 AM Certified in Neurology, Neurophysiology and Neuroimaging  [facility] [address] [place],  [address] [phone]

## 2020-09-03 DIAGNOSIS — K7401 Hepatic fibrosis, early fibrosis: Secondary | ICD-10-CM | POA: Insufficient documentation

## 2020-09-04 DIAGNOSIS — K8301 Primary sclerosing cholangitis: Secondary | ICD-10-CM | POA: Insufficient documentation

## 2020-09-08 ENCOUNTER — Telehealth: Payer: Self-pay

## 2020-09-08 ENCOUNTER — Ambulatory Visit (INDEPENDENT_AMBULATORY_CARE_PROVIDER_SITE_OTHER): Payer: Managed Care, Other (non HMO) | Admitting: Internal Medicine

## 2020-09-08 DIAGNOSIS — Z23 Encounter for immunization: Secondary | ICD-10-CM | POA: Diagnosis not present

## 2020-09-08 NOTE — Telephone Encounter (Signed)
Dr. Carlean Purl - this patient came in today for her 1st Twinrix vaccine. While she was here she indicated she had seen [facility] on Friday, 7-8 and was diagnosed with Primary sclerosing cholangitis and reccommended to have a colonoscopy. Please advise. FYI - she is scheduled for an OV with Ellouise Newer tomorrow, 7-13.

## 2020-09-09 ENCOUNTER — Telehealth: Payer: Self-pay | Admitting: Internal Medicine

## 2020-09-09 ENCOUNTER — Ambulatory Visit (INDEPENDENT_AMBULATORY_CARE_PROVIDER_SITE_OTHER): Payer: Managed Care, Other (non HMO) | Admitting: Physician Assistant

## 2020-09-09 ENCOUNTER — Encounter: Payer: Self-pay | Admitting: Physician Assistant

## 2020-09-09 ENCOUNTER — Ambulatory Visit
Admission: RE | Admit: 2020-09-09 | Discharge: 2020-09-09 | Disposition: A | Discharge status: Home / Self Care | Payer: Managed Care, Other (non HMO) | Source: Ambulatory Visit | Attending: Diagnostic Neuroimaging | Admitting: Diagnostic Neuroimaging

## 2020-09-09 ENCOUNTER — Other Ambulatory Visit: Payer: Self-pay

## 2020-09-09 VITALS — BP 110/76 | HR 114 | Ht 60.0 in | Wt 122.0 lb

## 2020-09-09 DIAGNOSIS — K921 Melena: Secondary | ICD-10-CM

## 2020-09-09 DIAGNOSIS — K8301 Primary sclerosing cholangitis: Secondary | ICD-10-CM

## 2020-09-09 DIAGNOSIS — R200 Anesthesia of skin: Secondary | ICD-10-CM | POA: Diagnosis not present

## 2020-09-09 DIAGNOSIS — R202 Paresthesia of skin: Secondary | ICD-10-CM

## 2020-09-09 DIAGNOSIS — M2550 Pain in unspecified joint: Secondary | ICD-10-CM | POA: Diagnosis not present

## 2020-09-09 DIAGNOSIS — R413 Other amnesia: Secondary | ICD-10-CM

## 2020-09-09 MED ORDER — GADOBENATE DIMEGLUMINE 529 MG/ML IV SOLN
11.0000 mL | Freq: Once | INTRAVENOUS | Status: AC | PRN
  Administered 2020-09-09: 11 mL via INTRAVENOUS

## 2020-09-09 NOTE — Patient Instructions (Signed)
You have been scheduled for a colonoscopy. Please follow written instructions given to you at your visit today.  Please pick up your prep supplies at the pharmacy within the next 1-3 days. If you use inhalers (even only as needed), please bring them with you on the day of your procedure.  If you are age 39 or older, your body mass index should be between 23-30. Your Body mass index is 23.83 kg/m. If this is out of the aforementioned range listed, please consider follow up with your Primary Care Provider.  If you are age 22 or younger, your body mass index should be between 19-25. Your Body mass index is 23.83 kg/m. If this is out of the aformentioned range listed, please consider follow up with your Primary Care Provider.   __________________________________________________________  The [facility] providers would like to encourage you to use [facility] to communicate with providers for non-urgent requests or questions.  Due to long hold times on the telephone, sending your provider a message by [facility] may be a faster and more efficient way to get a response.  Please allow 48 business hours for a response.  Please remember that this is for non-urgent requests.

## 2020-09-09 NOTE — Progress Notes (Signed)
Chief Complaint: Hematochezia and left upper quadrant pain  HPI:    Alicia Carr is a 40 year old female with past medical history as listed below including alcoholism, as well as new diagnosis of [place], known to Dr. Carlean Purl, who was referred to me by Inda Coke, PA for a complaint of hematochezia and left upper quadrant pain.    Today, the patient presents to clinic and tells me that she is newly diagnosed with [place].  Prior to this she was having a lot of joint pains for which she was given Diclofenac, she took this for a few days but then about 3 weeks ago on 08/29/2020 she had an episode of passing stool with a lot of bright red blood and clots that filled the toilet.  She stopped the Diclofenac after reading that GI bleeding may be a side effect and had no further episodes.  Does tell me that just prior to this she had some "abdominal discomfort".  Also describes a somewhat chronic left upper quadrant discomfort that seemed better after passing a good bowel movement.    Denies fever, chills, weight loss, nausea, vomiting or symptoms that awaken her from sleep.  Past Medical History:  Diagnosis Date   Alcoholism ([place])    Anemia    Gallstones    History of chicken pox    Shingles    Shingles 07/18/2020   Vaginal delivery 2003    Past Surgical History:  Procedure Laterality Date   CESAREAN SECTION  08/08/2006   CHOLECYSTECTOMY  2003    Current Outpatient Medications  Medication Sig Dispense Refill   BIOTIN PO Take 1 tablet by mouth daily. (Patient not taking: No sig reported)     COLLAGEN PO Take 1 Scoop by mouth 2 (two) times daily. (Patient not taking: Reported on 09/01/2020)     ferrous sulfate 325 (65 FE) MG EC tablet Take 1 tablet (325 mg total) by mouth daily with breakfast.  3   gabapentin (NEURONTIN) 100 MG capsule Take 2 capsules (200 mg total) by mouth at bedtime. 180 capsule 1   prednisoLONE acetate (PRED FORTE) 1 % ophthalmic suspension SMARTSIG:In Eye(s)     Vitamin D,  Ergocalciferol, (DRISDOL) 1.25 MG (50000 UNIT) CAPS capsule Take 1 capsule (50,000 Units total) by mouth every 7 (seven) days. 12 capsule 0   No current facility-administered medications for this visit.    Allergies as of 09/09/2020   (No Known Allergies)    Family History  Problem Relation Age of Onset   Mental illness Mother        paranoid schizophrenia   Hyperlipidemia Mother    Alcohol abuse Father    Diabetes Paternal Aunt    Cancer Maternal Grandmother        uncertain type    Social History   Socioeconomic History   Marital status: Married    Spouse name: Merrilee Seashore   Number of children: 2   Years of education: Not on file   Highest education level: Bachelor's degree (e.g., BA, AB, BS)  Occupational History   Occupation: Field seismologist  Tobacco Use   Smoking status: Never   Smokeless tobacco: Never  Vaping Use   Vaping Use: Never used  Substance and Sexual Activity   Alcohol use: Not Currently    Comment: 2.5 per day x8 to 10 years stopped 01/18/2020   Drug use: No   Sexual activity: Yes    Birth control/protection: None  Other Topics Concern   Not on file  Social History Narrative   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, [name]   1 son born 2003 1 daughter born 2008   Alcohol use regular for 10+ years stopped 01/18/2020 was 2 to 3 glasses of wine daily   No drug use   Coffee and tea 2 or 3 a day for caffeine   Social Determinants of Radio broadcast assistant Strain: Not on file  Food Insecurity: Not on file  Transportation Needs: Not on file  Physical Activity: Not on file  Stress: Not on file  Social Connections: Not on file  Intimate Partner Violence: Not on file    Review of Systems:    Constitutional: No weight loss, fever or chills Skin: No rash  Cardiovascular: No chest pain Respiratory: No SOB  Gastrointestinal: See HPI and otherwise negative Genitourinary: No dysuria  Neurological: No  headache Musculoskeletal: No new muscle or joint pain Hematologic: No bruising Psychiatric: No history of depression or anxiety   Physical Exam:  Vital signs: BP 110/76   Pulse (!) 114   Ht 5' (1.524 m)   Wt 122 lb (55.3 kg)   BMI 23.83 kg/m    Constitutional:   Pleasant female appears to be in NAD, Well developed, Well nourished, alert and cooperative Head:  Normocephalic and atraumatic. Eyes:   PEERL, EOMI. No icterus. Conjunctiva pink. Ears:  Normal auditory acuity. Neck:  Supple Throat: Oral cavity and pharynx without inflammation, swelling or lesion.  Respiratory: Respirations even and unlabored. Lungs clear to auscultation bilaterally.   No wheezes, crackles, or rhonchi.  Cardiovascular: Normal S1, S2. No MRG. Regular rate and rhythm. No peripheral edema, cyanosis or pallor.  Gastrointestinal:  Soft, nondistended, nontender. No rebound or guarding. Normal bowel sounds. No appreciable masses or hepatomegaly. Rectal:  Not performed.  Msk:  Symmetrical without gross deformities. Without edema, no deformity or joint abnormality.  Neurologic:  Alert and  oriented x4;  grossly normal neurologically.  Skin:   Dry and intact without significant lesions or rashes. Psychiatric: Demonstrates good judgement and reason without abnormal affect or behaviors.  RELEVANT LABS AND IMAGING: CBC    Component Value Date/Time   WBC 6.8 08/25/2020 1607   RBC 4.43 08/25/2020 1607   HGB 11.2 (L) 08/25/2020 1607   HGB 12.1 08/24/2016 0900   HCT 34.5 (L) 08/25/2020 1607   HCT 37.5 08/24/2016 0900   PLT 356.0 08/25/2020 1607   PLT 273 08/24/2016 0900   MCV 77.9 (L) 08/25/2020 1607   MCV 81 08/24/2016 0900   MCH 23.8 (L) 06/10/2020 1239   MCHC 32.5 08/25/2020 1607   RDW 20.7 (H) 08/25/2020 1607   RDW 14.9 08/24/2016 0900   LYMPHSABS 2.0 08/25/2020 1607   LYMPHSABS 2.0 08/24/2016 0900   MONOABS 0.4 08/25/2020 1607   EOSABS 0.0 08/25/2020 1607   EOSABS 0.2 08/24/2016 0900   BASOSABS 0.1  08/25/2020 1607   BASOSABS 0.0 08/24/2016 0900    CMP     Component Value Date/Time   NA 138 08/25/2020 1607   NA 144 08/24/2016 0900   K 3.7 08/25/2020 1607   CL 102 08/25/2020 1607   CO2 29 08/25/2020 1607   GLUCOSE 84 08/25/2020 1607   BUN 20 08/25/2020 1607   BUN 17 08/24/2016 0900   CREATININE 0.87 08/25/2020 1607   CREATININE 0.82 12/02/2019 0823   CALCIUM 9.2 08/25/2020 1607   PROT 7.4 08/25/2020 1607   PROT 7.3 12/21/2016 1222   ALBUMIN 4.4 08/25/2020 1607  ALBUMIN 4.5 12/21/2016 1222   AST 34 08/25/2020 1607   ALT 28 08/25/2020 1607   ALKPHOS 141 (H) 08/25/2020 1607   BILITOT 0.2 08/25/2020 1607   BILITOT <0.2 12/21/2016 1222   GFRNONAA 83 08/24/2016 0900   GFRAA 95 08/24/2016 0900    Assessment: 1.  Hematochezia: 1 episode of bright red blood with clots 3 weeks ago, none since; consider hemorrhoids versus IBD versus other 2.  PSC: Newly diagnosed, following with the liver clinic 3.  Joint pains: Question whether she has other autoimmune involvement, may need referral to rheumatology  Plan: 1.  Scheduled patient for diagnostic colonoscopy with Dr. Carlean Purl.  Did provide the patient with a detailed list of risks for the procedure and she agrees to proceed. 2.  Patient to follow in clinic per recommendations of Dr. Carlean Purl after time of procedure.  Ellouise Newer, PA-C [facility] 09/09/2020, 1:24 PM  Cc: Inda Coke, [place]

## 2020-09-09 NOTE — Telephone Encounter (Signed)
R/s'ed to August 15th per pt request.

## 2020-09-09 NOTE — Telephone Encounter (Signed)
Inbound call from pt requesting a call back stating she needs to reschedule her Hep inj. Please advise. Thanks.

## 2020-09-10 ENCOUNTER — Ambulatory Visit (INDEPENDENT_AMBULATORY_CARE_PROVIDER_SITE_OTHER): Payer: Managed Care, Other (non HMO) | Admitting: Diagnostic Neuroimaging

## 2020-09-10 ENCOUNTER — Encounter (INDEPENDENT_AMBULATORY_CARE_PROVIDER_SITE_OTHER): Payer: Managed Care, Other (non HMO) | Admitting: Diagnostic Neuroimaging

## 2020-09-10 DIAGNOSIS — R200 Anesthesia of skin: Secondary | ICD-10-CM | POA: Diagnosis not present

## 2020-09-10 DIAGNOSIS — R413 Other amnesia: Secondary | ICD-10-CM

## 2020-09-10 DIAGNOSIS — Z0289 Encounter for other administrative examinations: Secondary | ICD-10-CM

## 2020-09-10 DIAGNOSIS — R202 Paresthesia of skin: Secondary | ICD-10-CM

## 2020-09-11 ENCOUNTER — Encounter: Payer: Self-pay | Admitting: Family Medicine

## 2020-09-11 ENCOUNTER — Telehealth (INDEPENDENT_AMBULATORY_CARE_PROVIDER_SITE_OTHER): Payer: Managed Care, Other (non HMO) | Admitting: Family Medicine

## 2020-09-11 VITALS — Ht 60.0 in | Wt 124.0 lb

## 2020-09-11 DIAGNOSIS — B0229 Other postherpetic nervous system involvement: Secondary | ICD-10-CM

## 2020-09-11 DIAGNOSIS — R200 Anesthesia of skin: Secondary | ICD-10-CM

## 2020-09-11 DIAGNOSIS — R202 Paresthesia of skin: Secondary | ICD-10-CM

## 2020-09-11 DIAGNOSIS — R253 Fasciculation: Secondary | ICD-10-CM

## 2020-09-11 MED ORDER — GABAPENTIN 100 MG PO CAPS: 200.0000 mg | ORAL_CAPSULE | Freq: Every day | ORAL | 1 refills | Status: DC

## 2020-09-11 NOTE — Progress Notes (Signed)
   Alicia Carr is a 40 y.o. female who presents today for a virtual office visit.  Assessment/Plan:  Postherpetic neuralgia/numbness/tingling Follow-up with neurology and has had work-up including MRI nerve conduction study which was normal.  We will continue gabapentin 200 mg nightly as this seems to alleviate her symptoms.  Discussed natural course of postherpetic neuralgia and that it may take several months if not longer for her symptoms to resolve.  She has been tolerating gabapentin well without side effects.  We will complete her FMLA paperwork today per her request.  Discussed reasons to return to care.    Subjective:  HPI:  Patient here for shingles follow-up.  She requested FMLA paperwork to be completed today.  She has had ongoing issues with postherpetic neuralgia.  She saw neurology recently.  Had MRI and nerve conduction study done which was essentially normal.  She has been taking gabapentin which seems to be helping.       Objective/Observations  Physical Exam: Gen: NAD, resting comfortably Pulm: Normal work of breathing Neuro: Grossly normal, moves all extremities Psych: Normal affect and thought content  Virtual Visit via Video   I connected with Alicia Carr on 09/11/20 at  3:40 PM EDT by a video enabled telemedicine application and verified that I am speaking with the correct person using two identifiers. The limitations of evaluation and management by telemedicine and the availability of in person appointments were discussed. The patient expressed understanding and agreed to proceed.   Patient location: Home Provider location: [place] Office Persons participating in the virtual visit: Myself and Patient  Time Spent: 30 minutes of total time was spent on the date of the encounter performing the following actions: chart review prior to seeing the patient, obtaining history, completing FMLA paperwork, performing a medically necessary exam,  counseling on the treatment plan, placing orders, and documenting in our EHR.       Algis Greenhouse. Jerline Pain, MD 09/11/2020 2:09 PM

## 2020-09-14 NOTE — Procedures (Signed)
[facility]  NCS (NERVE CONDUCTION STUDY) WITH EMG (ELECTROMYOGRAPHY) REPORT   STUDY DATE: 09/14/20 PATIENT NAME: Alicia Carr DOB: 18-Oct-1980 MRN: [id number]  ORDERING CLINICIAN: Andrey Spearman, MD   TECHNOLOGIST: Sherre Scarlet ELECTROMYOGRAPHER: Earlean Polka. Zabian Swayne, MD  CLINICAL INFORMATION: 40 year old female with numbness and tingling.  FINDINGS: NERVE CONDUCTION STUDY:  Right median, right ulnar, right peroneal and right tibial motor responses are normal.  Right median, right ulnar, right sural and right superficial peroneal sensory responses are normal.  Right ulnar and right tibial F wave latencies are normal.   NEEDLE ELECTROMYOGRAPHY:  Needle examination of bilateral lower extremities and right upper extremity are normal.   IMPRESSION:   Normal study.  No electrodiagnostic evidence of large fiber neuropathy at this time.   INTERPRETING PHYSICIAN:  Penni Bombard, MD Certified in Neurology, Neurophysiology and Neuroimaging  [facility] [address], [place], [place] [address] [phone]   [facility]    Nerve / Sites Muscle Latency Ref. Amplitude Ref. Rel Amp Segments Distance Velocity Ref. Area    ms ms mV mV %  cm m/s m/s mVms  R Median - APB     Wrist APB 3.0 ?4.4 9.9 ?4.0 100 Wrist - APB 7   35.0     Upper arm APB 6.4  9.8  98.2 Upper arm - Wrist 21 63 ?49 35.4  R Ulnar - ADM     Wrist ADM 2.8 ?3.3 12.4 ?6.0 100 Wrist - ADM 7   44.6     B.Elbow ADM 5.4  10.7  86.6 B.Elbow - Wrist 16 60 ?49 40.0     A.Elbow ADM 7.1  10.7  99.3 A.Elbow - B.Elbow 10 59 ?49 39.5  R Peroneal - EDB     Ankle EDB 4.3 ?6.5 6.3 ?2.0 100 Ankle - EDB 9   21.1     Fib head EDB 9.4  6.1  95.6 Fib head - Ankle 26 50 ?44 21.0     Pop fossa EDB 11.4  5.9  97.3 Pop fossa - Fib head 10 49 ?44 20.4         Pop fossa - Ankle      R Tibial - AH     Ankle AH 5.7 ?5.8 18.7 ?4.0 100 Ankle - AH 9   35.9     Pop fossa AH 12.9  14.5   77.3 Pop fossa - Ankle 34 47 ?41 31.7             SNC    Nerve / Sites Rec. Site Peak Lat Ref.  Amp Ref. Segments Distance    ms ms V V  cm  R Sural - Ankle (Calf)     Calf Ankle 3.6 ?4.4 37 ?6 Calf - Ankle 14  R Superficial peroneal - Ankle     Lat leg Ankle 3.7 ?4.4 16 ?6 Lat leg - Ankle 14  R Median - Orthodromic (Dig II, Mid palm)     Dig II Wrist 2.6 ?3.4 35 ?10 Dig II - Wrist 13  R Ulnar - Orthodromic, (Dig V, Mid palm)     Dig V Wrist 2.6 ?3.1 17 ?5 Dig V - Wrist 73             F  Wave    Nerve F Lat Ref.   ms ms  R Tibial - AH 43.7 ?56.0  R Ulnar - ADM 22.4 ?32.0         EMG  Summary Table    Spontaneous MUAP Recruitment  Muscle IA Fib PSW Fasc Other Amp Dur. Poly Pattern  R. Tibialis anterior Normal None None None _______ Normal Normal Normal Normal  L. Tibialis anterior Normal None None None _______ Normal Normal Normal Normal  L. Gastrocnemius (Medial head) Normal None None None _______ Normal Normal Normal Normal  R. Deltoid Normal None None None _______ Normal Normal Normal Normal  R. First dorsal interosseous Normal None None None _______ Normal Normal Normal Normal

## 2020-09-15 ENCOUNTER — Encounter: Payer: Self-pay | Admitting: Family Medicine

## 2020-09-15 ENCOUNTER — Other Ambulatory Visit: Payer: Self-pay

## 2020-09-15 ENCOUNTER — Other Ambulatory Visit: Payer: Self-pay | Admitting: Family Medicine

## 2020-09-15 ENCOUNTER — Encounter (INDEPENDENT_AMBULATORY_CARE_PROVIDER_SITE_OTHER): Payer: Self-pay | Admitting: Otolaryngology

## 2020-09-15 ENCOUNTER — Encounter: Payer: Self-pay | Admitting: *Deleted

## 2020-09-15 ENCOUNTER — Ambulatory Visit (INDEPENDENT_AMBULATORY_CARE_PROVIDER_SITE_OTHER): Payer: Managed Care, Other (non HMO) | Admitting: Otolaryngology

## 2020-09-15 VITALS — Temp 95.4°F

## 2020-09-15 DIAGNOSIS — E559 Vitamin D deficiency, unspecified: Secondary | ICD-10-CM

## 2020-09-15 DIAGNOSIS — H9312 Tinnitus, left ear: Secondary | ICD-10-CM

## 2020-09-15 MED ORDER — VITAMIN D (ERGOCALCIFEROL) 1.25 MG (50000 UNIT) PO CAPS: 50000.0000 [IU] | ORAL_CAPSULE | ORAL | 0 refills | Status: DC

## 2020-09-15 NOTE — Progress Notes (Signed)
HPI: Alicia Carr is a 40 y.o. female who presents is referred by Dr. Jerline Pain for evaluation of tinnitus in the left ear.  This initially began following a bout of shingles toward the end of May that affected her left forehead and her left ear canal.  She did not notice any significant hearing loss but noticed ringing in the left ear at that time.  The shingles is doing better and the ringing likewise is doing a little bit better..  Past Medical History:  Diagnosis Date   Alcoholism ([place])    Anemia    Gallstones    History of chicken pox    Primary sclerosing cholangitis    Shingles    Shingles 07/18/2020   Vaginal delivery 2003   Past Surgical History:  Procedure Laterality Date   CESAREAN SECTION  08/08/2006   CHOLECYSTECTOMY  2003   Social History   Socioeconomic History   Marital status: Married    Spouse name: Merrilee Seashore   Number of children: 2   Years of education: Not on file   Highest education level: Bachelor's degree (e.g., BA, AB, BS)  Occupational History   Occupation: Field seismologist  Tobacco Use   Smoking status: Never   Smokeless tobacco: Never  Vaping Use   Vaping Use: Never used  Substance and Sexual Activity   Alcohol use: Not Currently    Comment: 2.5 per day x8 to 10 years stopped 01/18/2020   Drug use: No   Sexual activity: Yes    Birth control/protection: None  Other Topics Concern   Not on file  Social History Narrative   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, [name]   1 son born 2003 1 daughter born 2008   Alcohol use regular for 10+ years stopped 01/18/2020 was 2 to 3 glasses of wine daily   No drug use   Coffee and tea 2 or 3 a day for caffeine   Social Determinants of Radio broadcast assistant Strain: Not on file  Food Insecurity: Not on file  Transportation Needs: Not on file  Physical Activity: Not on file  Stress: Not on file  Social Connections: Not on file   Family History  Problem Relation  Age of Onset   Mental illness Mother        paranoid schizophrenia   Hyperlipidemia Mother    Alcohol abuse Father    Diabetes Paternal Aunt    Cancer Maternal Grandmother        uncertain type   No Known Allergies Prior to Admission medications   Medication Sig Start Date End Date Taking? Authorizing Provider  BIOTIN PO Take 1 tablet by mouth daily.    [provider]  COLLAGEN PO Take 1 Scoop by mouth 2 (two) times daily.    [provider]  ferrous sulfate 325 (65 FE) MG EC tablet Take 1 tablet (325 mg total) by mouth daily with breakfast. 06/18/20   Gatha Mayer, MD  gabapentin (NEURONTIN) 100 MG capsule Take 2 capsules (200 mg total) by mouth at bedtime. 09/11/20 12/10/20  Vivi Barrack, MD  prednisoLONE acetate (PRED FORTE) 1 % ophthalmic suspension SMARTSIG:In Eye(s) 08/24/20   [provider]  Vitamin D, Ergocalciferol, (DRISDOL) 1.25 MG (50000 UNIT) CAPS capsule Take 1 capsule (50,000 Units total) by mouth every 7 (seven) days. 09/15/20   Vivi Barrack, MD     Positive ROS: Otherwise negative.  She is having no dizziness.  All other systems have been reviewed and were otherwise negative with the exception of those mentioned in the HPI and as above.  Physical Exam: Constitutional: Alert, well-appearing, no acute distress Ears: External ears without lesions or tenderness. Ear canals are clear bilaterally with intact, clear TMs.  No skin changes within the ear canal or on the TM. Nasal: External nose without lesions.. Clear nasal passages Oral: Lips and gums without lesions. Tongue and palate mucosa without lesions. Posterior oropharynx clear. Neck: No palpable adenopathy or masses Respiratory: Breathing comfortably  Skin: No facial/neck lesions or rash noted.  She has some skin marks on the left side of her forehead where she had previous shingles.  But no open ulcerations.  Audiologic testing demonstrated normal hearing in both ears with no  hearing loss on either side.  SRT's were 10 dB bilaterally.  She had type A tympanograms bilaterally.  Procedures  Assessment: Left ear tinnitus following shingles.  Plan: Would expect this to gradually resolve.  I discussed with her concerning limited treatment options for tinnitus.  Discussed with her concerning using masking noise when the tinnitus is bothersome and to use ear protection when around loud noise. She will follow-up as needed.   Radene Journey, MD   CC:

## 2020-09-28 HISTORY — PX: COLONOSCOPY: SHX174

## 2020-10-01 ENCOUNTER — Encounter (INDEPENDENT_AMBULATORY_CARE_PROVIDER_SITE_OTHER): Payer: Self-pay

## 2020-10-04 NOTE — Progress Notes (Deleted)
Office Visit Note  [name]             Date of Birth: 03/23/1980           MRN: [id number]             PCP: Inda Coke, PA Referring: Maximiano Coss, NP Visit Date: 10/05/2020 Occupation: @GUAROCC @  Subjective:  No chief complaint on file.   History of Present Illness: Alicia Carr is a 40 y.o. female here for evaluation of joint pains and muscle weakness with positive ANA. She suffered an episode of shingles with ophthalmic and otic involvement earlier this year. 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 No evidence for primary sclerosing cholangitis. Minimal intrahepatic biliary duct prominence with upper normal extrahepatic biliary duct diameter. 2. Mild hepatomegaly.    Activities of Daily Living:  Patient reports morning stiffness for *** {minute/hour:19697}.   Patient {ACTIONS;DENIES/REPORTS:21021675::"Denies"} nocturnal pain.  Difficulty dressing/grooming: {ACTIONS;DENIES/REPORTS:21021675::"Denies"} Difficulty climbing stairs: {ACTIONS;DENIES/REPORTS:21021675::"Denies"} Difficulty getting out of chair: {ACTIONS;DENIES/REPORTS:21021675::"Denies"} Difficulty using hands for taps, buttons, cutlery, and/or writing: {ACTIONS;DENIES/REPORTS:21021675::"Denies"}  No Rheumatology ROS completed.   PMFS History:  Patient Active Problem List   Diagnosis Date Noted   Cholangitis? cause 06/18/2020   Obesity 11/29/2019    Past Medical History:  Diagnosis Date   Alcoholism ([place])    Anemia    Gallstones    History of chicken pox    Primary sclerosing cholangitis    Shingles    Shingles 07/18/2020   Vaginal delivery 2003    Family  History  Problem Relation Age of Onset   Mental illness Mother        paranoid schizophrenia   Hyperlipidemia Mother    Alcohol abuse Father    Diabetes Paternal Aunt    Cancer Maternal Grandmother        uncertain type   Past Surgical History:  Procedure Laterality Date   CESAREAN SECTION  08/08/2006   CHOLECYSTECTOMY  2003   Social History   Social History Narrative   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   Physical Exam   Musculoskeletal Exam: ***  CDAI Exam: CDAI Score: -- Patient Global: --; Provider Global: -- Swollen: --; Tender: -- Joint Exam 10/05/2020   No joint exam has been documented for this visit   There is currently no information documented on the homunculus. Go to the Rheumatology activity and complete the homunculus joint exam.  Investigation: No additional findings.  Imaging: MR BRAIN W WO CONTRAST  Result Date: 09/10/2020 [facility] NEUROIMAGING REPORT STUDY DATE: 09/09/20 PATIENT NAME: Alicia Carr DOB: [birth year]  MRN: [id number] ORDERING CLINICIAN: Penni Bombard, MD CLINICAL HISTORY: 39 year old female with numbness. EXAM: MR BRAIN W WO CONTRAST TECHNIQUE: MRI of the brain with and without contrast was obtained utilizing 5 mm axial slices with T1, T2, T2 flair, SWI and diffusion weighted views.  T1 sagittal, T2 coronal and postcontrast views in the axial and coronal plane were obtained.  CONTRAST: 17m multihance COMPARISON: none IMAGING SITE: [facility](1.5 Tesla MRI)  FINDINGS: No abnormal lesions are seen on diffusion-weighted views to suggest acute ischemia. The cortical sulci, fissures and cisterns are normal in size and appearance. Lateral, third and fourth ventricle are normal in size and appearance. No extra-axial fluid collections are seen. No evidence of mass effect or midline shift.  No abnormal lesions are seen on post contrast views.  On sagittal views the posterior fossa, pituitary gland and corpus callosum are unremarkable. No evidence of intracranial hemorrhage on SWI views. The orbits and their contents, paranasal sinuses and calvarium are unremarkable.  Intracranial flow voids are present.   Normal MRI brain (with and without). INTERPRETING PHYSICIAN: VPenni Bombard MD Certified in Neurology, Neurophysiology and Neuroimaging [facility] [address] [place]  [phone]  NCV with EMG(electromyography)  Result Date: 09/10/2020 PPenni Bombard MD     09/14/2020  5:49 PM  [facility] NCS (NERVE CONDUCTION STUDY) WITH EMG (ELECTROMYOGRAPHY) REPORT STUDY DATE: 09/14/20 PATIENT NAME: KKAROLYNA BIANCHINIDOB: 9Mar 05, 1982MRN: [id number] CLINICIAN: VAndrey Spearman MD TECHNOLOGIST: NSherre ScarletELECTROMYOGRAPHER: VEarlean Polka Penumalli, MD CLINICAL INFORMATION: 40year old female with numbness and tingling. FINDINGS: NERVE CONDUCTION STUDY: Right median, right ulnar, right peroneal and right tibial motor responses are normal. Right median, right ulnar, right sural and right superficial peroneal sensory responses are normal. Right ulnar and right tibial F wave latencies are normal. NEEDLE ELECTROMYOGRAPHY: Needle examination of bilateral lower extremities and right upper extremity are normal. IMPRESSION: Normal study.  No electrodiagnostic evidence of large fiber neuropathy at this  time. INTERPRETING PHYSICIAN: VPenni Bombard MD Certified in Neurology, Neurophysiology and Neuroimaging [facility] [address] [place]  [id number]([phone]  Nerve / Sites Muscle Latency Ref. Amplitude Ref. Rel Amp Segments Distance Velocity Ref. Area   ms ms mV mV %  cm m/s m/s mVms R Median - APB    Wrist APB 3.0 ?4.4 9.9 ?4.0 100 Wrist - APB 7   35.0    Upper arm APB 6.4  9.8  98.2 Upper arm - Wrist 21 63 ?49 35.4 R Ulnar - ADM    Wrist ADM 2.8 ?3.3 12.4 ?6.0 100 Wrist - ADM 7   44.6    B.Elbow ADM 5.4  10.7  86.6 B.Elbow - Wrist 16 60 ?49 40.0    A.Elbow ADM 7.1  10.7  99.3 A.Elbow - B.Elbow 10 59 ?49 39.5 R Peroneal - EDB    Ankle EDB 4.3 ?6.5 6.3 ?2.0 100 Ankle - EDB 9   21.1    Fib head EDB 9.4  6.1  95.6 Fib head - Ankle 26 50 ?44 21.0    Pop fossa EDB 11.4  5.9  97.3 Pop fossa - Fib head 10 49 ?44 20.4        Pop fossa - Ankle     R Tibial - AH    Ankle AH 5.7 ?5.8 18.7 ?4.0 100 Ankle - AH 9   35.9    Pop  fossa AH 12.9  14.5  77.3 Pop fossa - Ankle 34 47 ?41 31.7           SNC   Nerve / Sites Rec. Site Peak Lat Ref.  Amp Ref. Segments Distance   ms ms V V  cm R Sural - Ankle (Calf)    Calf Ankle 3.6 ?4.4 37 ?6 Calf - Ankle 14 R Superficial peroneal - Ankle    Lat leg Ankle 3.7 ?4.4 16 ?6 Lat leg - Ankle 14 R Median - Orthodromic (Dig II, Mid palm)    Dig II Wrist 2.6 ?3.4 35 ?10 Dig II - Wrist 13 R Ulnar - Orthodromic, (Dig V, Mid palm)    Dig V Wrist 2.6 ?3.1 17 ?5 Dig V - Wrist 50           F  Wave   Nerve F Lat Ref.  ms ms R Tibial - AH 43.7 ?56.0 R Ulnar - ADM 22.4 ?32.0       EMG Summary Table   Spontaneous MUAP Recruitment Muscle IA Fib PSW Fasc Other Amp Dur. Poly Pattern R. Tibialis anterior Normal None None None _______ Normal Normal Normal Normal L. Tibialis anterior Normal None None None _______ Normal Normal Normal Normal L. Gastrocnemius (Medial head) Normal None None None _______ Normal Normal Normal Normal R. Deltoid Normal None None None  _______ Normal Normal Normal Normal R. First dorsal interosseous Normal None None None _______ Normal Normal Normal Normal     Recent Labs: Lab Results  Component Value Date   WBC 6.8 08/25/2020   HGB 11.2 (L) 08/25/2020   PLT 356.0 08/25/2020   NA 138 08/25/2020   K 3.7 08/25/2020   CL 102 08/25/2020   CO2 29 08/25/2020   GLUCOSE 84 08/25/2020   BUN 20 08/25/2020   CREATININE 0.87 08/25/2020   BILITOT 0.2 08/25/2020   ALKPHOS 141 (H) 08/25/2020   AST 34 08/25/2020   ALT 28 08/25/2020   PROT 7.4 08/25/2020   ALBUMIN 4.4 08/25/2020   CALCIUM 9.2 08/25/2020   GFRAA 95 08/24/2016   QFTBGOLDPLUS NEGATIVE 07/23/2020    Speciality Comments: No specialty comments available.  Procedures:  No procedures performed Allergies: Patient has no known allergies.   Assessment / Plan:     Visit Diagnoses: No diagnosis found.  Orders: No orders of the defined types were placed in this encounter.  No orders of the defined types were placed in this encounter.   Face-to-face time spent with patient was *** minutes. Greater than 50% of time was spent in counseling and coordination of care.  Follow-Up Instructions: No follow-ups on file.   Collier Salina, MD  Note - This record has been created using [employer].  Chart creation errors have been sought, but may not always  have been located. Such creation errors do not reflect on  the standard of medical care.

## 2020-10-05 ENCOUNTER — Ambulatory Visit: Payer: Managed Care, Other (non HMO) | Admitting: Internal Medicine

## 2020-10-06 ENCOUNTER — Ambulatory Visit (INDEPENDENT_AMBULATORY_CARE_PROVIDER_SITE_OTHER): Payer: Managed Care, Other (non HMO) | Admitting: Internal Medicine

## 2020-10-06 ENCOUNTER — Encounter: Payer: Self-pay | Admitting: Internal Medicine

## 2020-10-06 ENCOUNTER — Other Ambulatory Visit: Payer: Self-pay

## 2020-10-06 VITALS — BP 107/71 | HR 82 | Ht 61.0 in | Wt 123.6 lb

## 2020-10-06 DIAGNOSIS — R7689 Other specified abnormal immunological findings in serum: Secondary | ICD-10-CM | POA: Insufficient documentation

## 2020-10-06 DIAGNOSIS — M2550 Pain in unspecified joint: Secondary | ICD-10-CM

## 2020-10-06 DIAGNOSIS — R768 Other specified abnormal immunological findings in serum: Secondary | ICD-10-CM

## 2020-10-06 NOTE — Progress Notes (Signed)
Office Visit Note  [name]             Date of Birth: 09-15-1980           MRN: [id number]             PCP: Inda Coke, [place] Referring: Maximiano Coss, NP Visit Date: 10/06/2020 Occupation: Social work Freight forwarder  Subjective:  New Patient (Initial Visit) (Patient complains of intermittent joint pain, worse in small joints (fingers and toes) as well as numbness and tingling in fingers and toes. )   History of Present Illness: Alicia Carr is a 40 y.o. female here with joint pains and numbness affecting bilateral fingers and toes that is worse especially in the past few months.  She developed shingles outbreak on the face and left eye in May this is treated and symptoms have improved besides some residual hyperpigmentation.  However also slightly earlier this year was recently diagnosed with [place] by biopsy during evaluation of abdominal symptoms and abnormal liver function tests.  She reports morning stiffness difficulty gripping her hands tightly or performing tasks first thing in the morning this improves some time and is normal by the afternoon.  She notices occasional swelling or puffiness especially in the feet but not all the time.  She denies discoloration such as pallor, erythema, or cyanosis.  She tried taking oral diclofenac for joint pains but developed an episode of painless hematochezia so stopped this medication. She denies any lymphadenopathy, photosensitive rashes, or history of blood clots.   Labs reviewed 06/2020 ANA 1:80 homogenous ASMA neg ESR 48 CRP wnl HAV/HBV/HCV neg TB neg  05/2020 Iron 24 TIBC 472 Sat 5% Ferritin 4    Activities of Daily Living:  Patient reports morning stiffness for 60 minutes.   Patient Reports nocturnal pain.  Difficulty dressing/grooming: Denies Difficulty climbing stairs: Reports Difficulty getting out of chair: Denies Difficulty using hands for taps, buttons, cutlery, and/or writing: Reports  Review of  Systems  Constitutional:  Positive for fatigue.  HENT:  Positive for mouth dryness. Negative for mouth sores and nose dryness.   Eyes:  Positive for pain and visual disturbance. Negative for itching and dryness.  Respiratory:  Negative for cough, hemoptysis, shortness of breath and difficulty breathing.   Cardiovascular:  Negative for chest pain, palpitations and swelling in legs/feet.  Gastrointestinal:  Positive for abdominal pain. Negative for blood in stool, constipation and diarrhea.  Endocrine: Negative for increased urination.  Genitourinary:  Negative for painful urination.  Musculoskeletal:  Positive for joint pain, joint pain, joint swelling and morning stiffness. Negative for myalgias, muscle weakness, muscle tenderness and myalgias.  Skin:  Negative for color change, rash and redness.  Allergic/Immunologic: Negative for susceptible to infections.  Neurological:  Positive for numbness, parasthesias and memory loss. Negative for dizziness, headaches and weakness.  Hematological:  Negative for swollen glands.  Psychiatric/Behavioral:  Positive for confusion. Negative for sleep disturbance.    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 cause 06/18/2020    Past Medical History:  Diagnosis Date   Alcoholism ([place])    Anemia    Gallstones    History of chicken pox    Primary sclerosing cholangitis    Shingles    Shingles 07/18/2020   Vaginal delivery 2003    Family History  Problem  Relation Age of Onset   Mental illness Mother        paranoid schizophrenia   Hyperlipidemia Mother    Alcohol abuse Father    Healthy Brother    Diabetes Paternal Aunt    Cancer Maternal Grandmother        uncertain type   Healthy Daughter    Healthy Son    Past Surgical History:  Procedure  Laterality Date   CESAREAN SECTION  08/08/2006   CHOLECYSTECTOMY  2003   Social History   Social History Narrative   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, [name]   1 son born 2003 1 daughter born 2008   Alcohol use regular for 10+ years stopped 01/18/2020 was 2 to 3 glasses of wine daily   No drug use   Coffee and tea 2 or 3 a day for caffeine   Immunization History  Administered Date(s) Administered   Hep A / Hep B 09/08/2020   Influenza,inj,Quad PF,6+ Mos 12/26/2018, 11/29/2019   Influenza,inj,quad, With Preservative 11/29/2019   PFIZER(Purple Top)SARS-COV-2 Vaccination 05/02/2019, 05/23/2019, 01/03/2020   Tdap 05/10/2006, 11/29/2019     Objective: Vital Signs: BP 107/71 (BP Location: Right Arm, Patient Position: Sitting, Cuff Size: Normal)   Pulse 82   Ht 5' 1" (1.549 m)   Wt 123 lb 9.6 oz (56.1 kg)   BMI 23.35 kg/m    Physical Exam HENT:     Right Ear: External ear normal.     Left Ear: External ear normal.     Mouth/Throat:     Mouth: Mucous membranes are moist.     Pharynx: Oropharynx is clear.  Eyes:     Conjunctiva/sclera: Conjunctivae normal.  Cardiovascular:     Rate and Rhythm: Normal rate and regular rhythm.  Pulmonary:     Effort: Pulmonary effort is normal.     Breath sounds: Normal breath sounds.  Skin:    General: Skin is warm and dry.     Comments: Normal nailfold capillaroscopy  Neurological:     General: No focal deficit present.     Mental Status: She is alert.     Deep Tendon Reflexes: Reflexes normal.  Psychiatric:        Mood and Affect: Mood normal.     Musculoskeletal Exam:  Neck full ROM no tenderness Shoulders full ROM no tenderness or swelling Elbows full ROM no tenderness or swelling Wrists full ROM no tenderness or swelling Fingers full ROM no tenderness or swelling No paraspinal tenderness to palpation over upper and lower back Hip normal internal and external rotation without pain, no tenderness to  lateral hip palpation Knees full ROM no tenderness or swelling Ankles full ROM no tenderness or swelling MTPs full ROM no tenderness or swelling     Investigation: No additional findings.  Imaging: MR BRAIN W WO CONTRAST  Result Date: 09/10/2020 [facility] NEURO[facility] REPORT STUDY DATE: 09/09/20 PATIENT NAME: KRYSTEENA STALKER DOB: Jan 12, 1981 MRN: [id number] ORDERING CLINICIAN: Penni Bombard, MD CLINICAL HISTORY: 40 year old female with numbness. EXAM: MR BRAIN W WO CONTRAST TECHNIQUE: MRI of the brain with and without contrast was obtained utilizing 5 mm axial slices with T1, T2, T2 flair, SWI and diffusion weighted views.  T1 sagittal, T2 coronal and postcontrast views in the axial and coronal plane were obtained. CONTRAST: 38m multihance COMPARISON: none IMAGING SITE: [facility](1.5 Tesla MRI)  FINDINGS: No abnormal lesions are seen on diffusion-weighted views  to suggest acute ischemia. The cortical sulci, fissures and cisterns are normal in size and appearance. Lateral, third and fourth ventricle are normal in size and appearance. No extra-axial fluid collections are seen. No evidence of mass effect or midline shift.  No abnormal lesions are seen on post contrast views.  On sagittal views the posterior fossa, pituitary gland and corpus callosum are unremarkable. No evidence of intracranial hemorrhage on SWI views. The orbits and their contents, paranasal sinuses and calvarium are unremarkable.  Intracranial flow voids are present.   Normal MRI brain (with and without). INTERPRETING PHYSICIAN: Penni Bombard, MD Certified in Neurology, Neurophysiology and Neuroimaging [facility] [address] [place], [place] [address] [phone]   NCV with EMG(electromyography)  Result Date: 09/10/2020 Penni Bombard, MD     09/14/2020  5:49 PM  [facility] NCS (NERVE CONDUCTION STUDY) WITH EMG  (ELECTROMYOGRAPHY) REPORT STUDY DATE: 09/14/20 PATIENT NAME: ELVERTA DIMICELI DOB: 03/15/1980 MRN: [id number] ORDERING CLINICIAN: Andrey Spearman, MD TECHNOLOGIST: Sherre Scarlet ELECTROMYOGRAPHER: Earlean Polka. Penumalli, MD CLINICAL INFORMATION: 40 year old female with numbness and tingling. FINDINGS: NERVE CONDUCTION STUDY: Right median, right ulnar, right peroneal and right tibial motor responses are normal. Right median, right ulnar, right sural and right superficial peroneal sensory responses are normal. Right ulnar and right tibial F wave latencies are normal. NEEDLE ELECTROMYOGRAPHY: Needle examination of bilateral lower extremities and right upper extremity are normal. IMPRESSION: Normal study.  No electrodiagnostic evidence of large fiber neuropathy at this time. INTERPRETING PHYSICIAN: Penni Bombard, MD Certified in Neurology, Neurophysiology and Neuroimaging [facility] [address] [place], [place] [address] [phone] [facility]   Nerve / Sites Muscle Latency Ref. Amplitude Ref. Rel Amp Segments Distance Velocity Ref. Area   ms ms mV mV %  cm m/s m/s mVms R Median - APB    Wrist APB 3.0 ?4.4 9.9 ?4.0 100 Wrist - APB 7   35.0    Upper arm APB 6.4  9.8  98.2 Upper arm - Wrist 21 63 ?49 35.4 R Ulnar - ADM    Wrist ADM 2.8 ?3.3 12.4 ?6.0 100 Wrist - ADM 7   44.6    B.Elbow ADM 5.4  10.7  86.6 B.Elbow - Wrist 16 60 ?49 40.0    A.Elbow ADM 7.1  10.7  99.3 A.Elbow - B.Elbow 10 59 ?49 39.5 R Peroneal - EDB    Ankle EDB 4.3 ?6.5 6.3 ?2.0 100 Ankle - EDB 9   21.1    Fib head EDB 9.4  6.1  95.6 Fib head - Ankle 26 50 ?44 21.0    Pop fossa EDB 11.4  5.9  97.3 Pop fossa - Fib head 10 49 ?44 20.4        Pop fossa - Ankle     R Tibial - AH    Ankle AH 5.7 ?5.8 18.7 ?4.0 100 Ankle - AH 9   35.9    Pop fossa AH 12.9  14.5  77.3 Pop fossa - Ankle 34 47 ?41 31.7           SNC   Nerve / Sites Rec. Site Peak Lat Ref.  Amp Ref. Segments Distance   ms ms V V  cm R Sural - Ankle (Calf)    Calf Ankle 3.6 ?4.4  37 ?6 Calf - Ankle 14 R Superficial peroneal - Ankle    Lat leg Ankle 3.7 ?4.4 16 ?6 Lat leg - Ankle 14 R Median -  Orthodromic (Dig II, Mid palm)    Dig II Wrist 2.6 ?3.4 35 ?10 Dig II - Wrist 13 R Ulnar - Orthodromic, (Dig V, Mid palm)    Dig V Wrist 2.6 ?3.1 17 ?5 Dig V - Wrist 74           F  Wave   Nerve F Lat Ref.  ms ms R Tibial - AH 43.7 ?56.0 R Ulnar - ADM 22.4 ?32.0       EMG Summary Table   Spontaneous MUAP Recruitment Muscle IA Fib PSW Fasc Other Amp Dur. Poly Pattern R. Tibialis anterior Normal None None None _______ Normal Normal Normal Normal L. Tibialis anterior Normal None None None _______ Normal Normal Normal Normal L. Gastrocnemius (Medial head) Normal None None None _______ Normal Normal Normal Normal R. Deltoid Normal None None None _______ Normal Normal Normal Normal R. First dorsal interosseous Normal None None None _______ Normal Normal Normal Normal     Recent Labs: Lab Results  Component Value Date   WBC 6.8 08/25/2020   HGB 11.2 (L) 08/25/2020   PLT 356.0 08/25/2020   NA 138 08/25/2020   K 3.7 08/25/2020   CL 102 08/25/2020   CO2 29 08/25/2020   GLUCOSE 84 08/25/2020   BUN 20 08/25/2020   CREATININE 0.87 08/25/2020   BILITOT 0.2 08/25/2020   ALKPHOS 141 (H) 08/25/2020   AST 34 08/25/2020   ALT 28 08/25/2020   PROT 7.4 08/25/2020   ALBUMIN 4.4 08/25/2020   CALCIUM 9.2 08/25/2020   GFRAA 95 08/24/2016   QFTBGOLDPLUS NEGATIVE 07/23/2020    Speciality Comments: No specialty comments available.  Procedures:  No procedures performed Allergies: Patient has no known allergies.   Assessment / Plan:     Visit Diagnoses: Positive ANA (antinuclear antibody) - Plan: Sedimentation rate, Anti-Smith antibody, Anti-DNA antibody, double-stranded, Sjogrens syndrome-A extractable nuclear antibody, RNP Antibody, Urinalysis, Routine w reflex microscopic  Positive ANA with dryness and arthralgias no highly specific clinical signs present on exam today so lower pretest suspicion  for disease.  We will check more specific serology to see if there are antibodies besides the previous identified smooth muscle.  Also checking urinalysis screening for hematuria or proteinuria.  Arthralgia, unspecified joint  Joint pain of multiple sites no frank synovitis seen on exam.  She has been recommended for colonoscopy screening for IBD based on the new liver findings.  If present this would raise suspicion for an enteric related arthropathy but I do not see any specific inflammation at this time.  Orders: Orders Placed This Encounter  Procedures   Sedimentation rate   Anti-Smith antibody   Anti-DNA antibody, double-stranded   Sjogrens syndrome-A extractable nuclear antibody   RNP Antibody   Urinalysis, Routine w reflex microscopic   No orders of the defined types were placed in this encounter.    Follow-Up Instructions: No follow-ups on file.   Collier Salina, MD  Note - This record has been created using [employer].  Chart creation errors have been sought, but may not always  have been located. Such creation errors do not reflect on  the standard of medical care.

## 2020-10-07 LAB — URINALYSIS, ROUTINE W REFLEX MICROSCOPIC
Bilirubin Urine: NEGATIVE
Glucose, UA: NEGATIVE
Hgb urine dipstick: NEGATIVE
Ketones, ur: NEGATIVE
Leukocytes,Ua: NEGATIVE
Nitrite: NEGATIVE
Protein, ur: NEGATIVE
Specific Gravity, Urine: 1.021 (ref 1.001–1.035)
pH: 6 (ref 5.0–8.0)

## 2020-10-07 LAB — RNP ANTIBODY: Ribonucleic Protein(ENA) Antibody, IgG: <1 AI

## 2020-10-07 LAB — ANTI-SMITH ANTIBODY: ENA SM Ab Ser-aCnc: <1 AI

## 2020-10-07 LAB — SEDIMENTATION RATE: Sed Rate: 14 mm/h (ref 0–20)

## 2020-10-07 LAB — SJOGRENS SYNDROME-A EXTRACTABLE NUCLEAR ANTIBODY: SSA (Ro) (ENA) Antibody, IgG: <1 AI

## 2020-10-07 LAB — ANTI-DNA ANTIBODY, DOUBLE-STRANDED: ds DNA Ab: 4 IU/mL

## 2020-10-09 ENCOUNTER — Other Ambulatory Visit: Payer: Self-pay

## 2020-10-09 ENCOUNTER — Ambulatory Visit (AMBULATORY_SURGERY_CENTER): Payer: Managed Care, Other (non HMO) | Admitting: Internal Medicine

## 2020-10-09 ENCOUNTER — Encounter: Payer: Self-pay | Admitting: Internal Medicine

## 2020-10-09 VITALS — BP 115/71 | HR 82 | Temp 97.1°F | Resp 12 | Ht 60.0 in | Wt 122.0 lb

## 2020-10-09 DIAGNOSIS — K648 Other hemorrhoids: Secondary | ICD-10-CM | POA: Diagnosis not present

## 2020-10-09 DIAGNOSIS — K921 Melena: Secondary | ICD-10-CM

## 2020-10-09 MED ORDER — SODIUM CHLORIDE 0.9 % IV SOLN: 500.0000 mL | Freq: Once | INTRAVENOUS | Status: DC

## 2020-10-09 NOTE — Progress Notes (Signed)
History and Physical Interval Note:  10/09/2020 1:42 PM  Alicia Carr  has presented today for endoscopic procedure(s), with the diagnosis of  Encounter Diagnosis  Name Primary?   Hematochezia Yes  .  The various methods of evaluation and treatment have been discussed with the patient and/or family. After consideration of risks, benefits and other options for treatment, the patient has consented to  the endoscopic procedure(s).   The patient's history has been reviewed, patient examined, no change in status, stable for surgery.  I have reviewed the patient's chart and labs.  Questions were answered to the patient's satisfaction.     Gatha Mayer, MD, [facility]

## 2020-10-09 NOTE — Op Note (Signed)
[place] Patient Name: Alicia Carr Procedure Date: 10/09/2020 1:34 PM MRN: [id number] Endoscopist: Gatha Mayer , MD Age: 40 Referring MD:  Date of Birth: 18-Feb-1981 Gender: Female Account #: [id number] Procedure:                Colonoscopy Indications:              Hematochezia, Primary sclerosing cholangitis Medicines:                Propofol per Anesthesia, Monitored Anesthesia Care Procedure:                Pre-Anesthesia Assessment:                           - Prior to the procedure, a History and Physical                            was performed, and patient medications and                            allergies were reviewed. The patient's tolerance of                            previous anesthesia was also reviewed. The risks                            and benefits of the procedure and the sedation                            options and risks were discussed with the patient.                            All questions were answered, and informed consent                            was obtained. Prior Anticoagulants: The patient has                            taken no previous anticoagulant or antiplatelet                            agents. ASA Grade Assessment: II - A patient with                            mild systemic disease. After reviewing the risks                            and benefits, the patient was deemed in                            satisfactory condition to undergo the procedure.                           After obtaining informed consent, the colonoscope  was passed under direct vision. Throughout the                            procedure, the patient's blood pressure, pulse, and                            oxygen saturations were monitored continuously. The                            Olympus [id number]-[id number]) Colonoscope was                            introduced through the anus and advanced to the the                             terminal ileum. The colonoscopy was performed                            without difficulty. The patient tolerated the                            procedure well. The quality of the bowel                            preparation was good. The terminal ileum, ileocecal                            valve, appendiceal orifice, and rectum were                            photographed. The bowel preparation used was                            Miralax via split dose instruction. Scope In: 1:44:47 PM Scope Out: 1:56:26 PM Scope Withdrawal Time: 0 hours 9 minutes 1 second  Total Procedure Duration: 0 hours 11 minutes 39 seconds  Findings:                 The perianal and digital rectal examinations were                            normal.                           External hemorrhoids were found.                           The terminal ileum appeared normal. Biopsies were                            taken with a cold forceps for histology.                            Verification of patient identification for the  specimen was done. Estimated blood loss was minimal.                           The colon (entire examined portion) appeared                            normal. Biopsies were taken with a cold forceps for                            histology. Verification of patient identification                            for the specimen was done. Estimated blood loss was                            minimal. Complications:            No immediate complications. Estimated Blood Loss:     Estimated blood loss was minimal. Impression:               - External hemorrhoids.                           - The examined portion of the ileum was normal.                            Biopsied.                           - The entire examined colon is normal. Biopsied. Recommendation:           - Patient has a contact number available for                            emergencies. The signs and  symptoms of potential                            delayed complications were discussed with the                            patient. Return to normal activities tomorrow.                            Written discharge instructions were provided to the                            patient.                           - Resume previous diet.                           - Continue present medications.                           - Await pathology results.                           -  Repeat colonoscopy is recommended. The                            colonoscopy date will be determined after pathology                            results from today's exam become available for                            review. Gatha Mayer, MD 10/09/2020 2:09:21 PM This report has been signed electronically.

## 2020-10-09 NOTE — Progress Notes (Signed)
Called to room to assist during endoscopic procedure.  Patient ID and intended procedure confirmed with present staff. Received instructions for my participation in the procedure from the performing physician.  

## 2020-10-09 NOTE — Progress Notes (Signed)
D.T. vital signs. °

## 2020-10-09 NOTE — Progress Notes (Signed)
A/ox3, pleased with MAC, report to RN 

## 2020-10-09 NOTE — Patient Instructions (Addendum)
The colon and small intestine look normal. I took biopsies to double check as per usual.  You bled from your hemorrhoids.  I will contact you with results and plans.  I appreciate the opportunity to care for you. Gatha Mayer, MD, [facility]  Please read handouts provided. Continue present medications.  YOU HAD AN ENDOSCOPIC PROCEDURE TODAY AT [facility]:   Refer to the procedure report that was given to you for any specific questions about what was found during the examination.  If the procedure report does not answer your questions, please call your gastroenterologist to clarify.  If you requested that your care partner not be given the details of your procedure findings, then the procedure report has been included in a sealed envelope for you to review at your convenience later.  YOU SHOULD EXPECT: Some feelings of bloating in the abdomen. Passage of more gas than usual.  Walking can help get rid of the air that was put into your GI tract during the procedure and reduce the bloating. If you had a lower endoscopy (such as a colonoscopy or flexible sigmoidoscopy) you may notice spotting of blood in your stool or on the toilet paper. If you underwent a bowel prep for your procedure, you may not have a normal bowel movement for a few days.  Please Note:  You might notice some irritation and congestion in your nose or some drainage.  This is from the oxygen used during your procedure.  There is no need for concern and it should clear up in a day or so.  SYMPTOMS TO REPORT IMMEDIATELY:  Following lower endoscopy (colonoscopy or flexible sigmoidoscopy):  Excessive amounts of blood in the stool  Significant tenderness or worsening of abdominal pains  Swelling of the abdomen that is new, acute  Fever of 100F or higher   For urgent or emergent issues, a gastroenterologist can be reached at any hour by calling [phone]. Do not use MyChart messaging for urgent concerns.     DIET:  We do recommend a small meal at first, but then you may proceed to your regular diet.  Drink plenty of fluids but you should avoid alcoholic beverages for 24 hours.  ACTIVITY:  You should plan to take it easy for the rest of today and you should NOT DRIVE or use heavy machinery until tomorrow (because of the sedation medicines used during the test).    FOLLOW UP: Our staff will call the number listed on your records 48-72 hours following your procedure to check on you and address any questions or concerns that you may have regarding the information given to you following your procedure. If we do not reach you, we will leave a message.  We will attempt to reach you two times.  During this call, we will ask if you have developed any symptoms of COVID 19. If you develop any symptoms (ie: fever, flu-like symptoms, shortness of breath, cough etc.) before then, please call [phone].  If you test positive for Covid 19 in the 2 weeks post procedure, please call and report this information to [place].    If any biopsies were taken you will be contacted by phone or by letter within the next 1-3 weeks.  Please call us at [phone] if you have not heard about the biopsies in 3 weeks.    SIGNATURES/CONFIDENTIALITY: You and/or your care partner have signed paperwork which will be entered into your electronic medical record.  These signatures attest  to the fact that that the information above on your After Visit Summary has been reviewed and is understood.  Full responsibility of the confidentiality of this discharge information lies with you and/or your care-partner.

## 2020-10-09 NOTE — Progress Notes (Signed)
Pt's states no medical or surgical changes since previsit or office visit. 

## 2020-10-12 ENCOUNTER — Ambulatory Visit (INDEPENDENT_AMBULATORY_CARE_PROVIDER_SITE_OTHER): Payer: Managed Care, Other (non HMO) | Admitting: Internal Medicine

## 2020-10-12 DIAGNOSIS — Z23 Encounter for immunization: Secondary | ICD-10-CM

## 2020-10-13 ENCOUNTER — Telehealth: Payer: Self-pay | Admitting: *Deleted

## 2020-10-13 NOTE — Telephone Encounter (Signed)
  Follow up Call-  Call back number 10/09/2020  Post procedure Call Back phone  # [phone]  Permission to leave phone message Yes  Some recent data might be hidden    [facility]

## 2020-10-13 NOTE — Telephone Encounter (Signed)
  Follow up Call-  Call back number 10/09/2020  Post procedure Call Back phone  # [phone]  Permission to leave phone message Yes  Some recent data might be hidden     Patient questions:  Do you have a fever, pain , or abdominal swelling? No. Pain Score  0 *  Have you tolerated food without any problems? Yes.    Have you been able to return to your normal activities? Yes.    Do you have any questions about your discharge instructions: Diet   No. Medications  No. Follow up visit  No.  Do you have questions or concerns about your Care? No.  Actions: * If pain score is 4 or above: No action needed, pain <4.  Have you developed a fever since your procedure? no  2.   Have you had an respiratory symptoms (SOB or cough) since your procedure? no  3.   Have you tested positive for COVID 19 since your procedure no  4.   Have you had any family members/close contacts diagnosed with the COVID 19 since your procedure?  no   If yes to any of these questions please route to Joylene John, RN and Joella Prince, RN

## 2020-10-16 ENCOUNTER — Encounter: Payer: Self-pay | Admitting: Internal Medicine

## 2020-10-16 NOTE — Telephone Encounter (Signed)
I reviewed the message I regret to hear she is having so many ongoing problems. I don't have any specific additional lab testing to recommend at this time. We could take another look to discuss findings whether I have any other direction to recommend. Can reschedule with plan to have a visit with ultrasound exam of joints.

## 2020-10-16 NOTE — Telephone Encounter (Signed)
I called patient, patient scheduled for f/u 8/23 at f/u and [place].

## 2020-10-16 NOTE — Progress Notes (Signed)
Results are all normal including the sedimentation rate, which was previously elevated. Urine sample is also negative for any problems. The colonoscopy report looks very benign with no evidence for inflammatory bowel disease. Overall I don't see any evidence for a systemic autoimmune disease causing symptoms at this time so probably don't need to follow up unless for new problems.

## 2020-10-19 NOTE — Progress Notes (Signed)
Office Visit Note  [name]             Date of Birth: [birth year]           MRN: [id number]             PCP: Inda Coke, PA Referring: Inda Coke, PA Visit Date: 10/20/2020   Subjective:   History of Present Illness: Alicia Carr is a 40 y.o. female here for follow up for joint pain and intermittent numbness, skin rashes with recent shingles, and peripheral swelling. Lab testing at our initial visit was negative for any specific autoantibody markers.  She continues with the joint pains and numbness especially affecting her medial hand along the fourth and fifth fingers and at the wrist.  She has not noticed large amounts of joint swelling since her last visit.  She does continue to experience generalized symptoms with fatigue itching and concentration difficulty.   Previous HPI 10/06/20 [name] is a 40 y.o. female here with joint pains and numbness affecting bilateral fingers and toes that is worse especially in the past few months.  She developed shingles outbreak on the face and left eye in May this is treated and symptoms have improved besides some residual hyperpigmentation.  However also slightly earlier this year was recently diagnosed with [place] by biopsy during evaluation of abdominal symptoms and abnormal liver function tests.  She reports morning stiffness difficulty gripping her hands tightly or performing tasks first thing in the morning this improves some time and is normal by the afternoon.  She notices occasional swelling or puffiness especially in the feet but not all the time.  She denies discoloration such as pallor, erythema, or cyanosis.  She tried taking oral diclofenac for joint pains but developed an episode of painless hematochezia so stopped this medication. She denies any lymphadenopathy, photosensitive rashes, or history of blood clots.     Labs reviewed 06/2020 ANA 1:80 homogenous ASMA neg ESR 48 CRP wnl HAV/HBV/HCV  neg TB neg   05/2020 Iron 24 TIBC 472 Sat 5% Ferritin 4   Review of Systems  Constitutional:  Positive for fatigue.  HENT:  Negative for mouth sores, mouth dryness and nose dryness.   Eyes:  Positive for pain and itching. Negative for visual disturbance and dryness.  Respiratory:  Negative for cough, hemoptysis, shortness of breath and difficulty breathing.   Cardiovascular:  Negative for chest pain, palpitations and swelling in legs/feet.  Gastrointestinal:  Positive for abdominal pain. Negative for blood in stool, constipation and diarrhea.  Endocrine: Negative for increased urination.  Genitourinary:  Negative for painful urination.  Musculoskeletal:  Positive for joint pain and joint pain. Negative for joint swelling, myalgias, muscle weakness, morning stiffness, muscle tenderness and myalgias.  Skin:  Positive for color change. Negative for rash and redness.  Allergic/Immunologic: Positive for susceptible to infections.  Neurological:  Positive for numbness and memory loss. Negative for dizziness, headaches and weakness.  Hematological:  Negative for swollen glands.  Psychiatric/Behavioral:  Positive for confusion and sleep disturbance.    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 cause 06/18/2020    Past Medical History:  Diagnosis Date   Alcoholism ([place])    Anemia    Gallstones    History of chicken pox    Primary sclerosing cholangitis  Shingles    Shingles 07/18/2020   Vaginal delivery 2003    Family History  Problem Relation Age of Onset   Mental illness Mother        paranoid schizophrenia   Hyperlipidemia Mother    Alcohol abuse Father    Colon polyps Father    Healthy Brother    Diabetes Paternal Aunt    Cancer Maternal Grandmother        uncertain type    Healthy Daughter    Healthy Son    Past Surgical History:  Procedure Laterality Date   CESAREAN SECTION  08/08/2006   CHOLECYSTECTOMY  2003   Social History   Social History Narrative   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, [name]   1 son born 2003 1 daughter born 2008   Alcohol use regular for 10+ years stopped 01/18/2020 was 2 to 3 glasses of wine daily   No drug use   Coffee and tea 2 or 3 a day for caffeine   Immunization History  Administered Date(s) Administered   Hep A / Hep B 09/08/2020, 10/12/2020   Influenza,inj,Quad PF,6+ Mos 12/26/2018, 11/29/2019   Influenza,inj,quad, With Preservative 11/29/2019   PFIZER(Purple Top)SARS-COV-2 Vaccination 05/02/2019, 05/23/2019, 01/03/2020   Tdap 05/10/2006, 11/29/2019     Objective: Vital Signs: BP 110/76 (BP Location: Left Arm, Patient Position: Sitting, Cuff Size: Normal)   Pulse (!) 101   Ht 5' 0.75" (1.543 m)   Wt 126 lb (57.2 kg)   BMI 24.00 kg/m    Physical Exam Eyes:     Conjunctiva/sclera: Conjunctivae normal.  Skin:    General: Skin is warm and dry.     Findings: No rash.  Neurological:     Mental Status: She is alert.  Psychiatric:        Mood and Affect: Mood normal.     Musculoskeletal Exam:  Elbows full ROM no tenderness or swelling Wrists full ROM no swelling, slight tenderness, prominent ulnar head position bilaterally, no tendon friction rub, negative tinel and phalen signs Fingers full ROM no tenderness or swelling   Investigation: No additional findings.  Imaging: [place] COMPLETE JOINT SPACE STRUCTURES UP BILAT  Result Date: 10/25/2020 Complete joint ultrasound of bilateral hands and wrist Negative for synovitis or color Doppler enhancement in the right second MCP, third MCP, fourth MCP, fifth MCP, or first MCP.  Right wrist ultrasound positive for color Doppler enhancement tenderness structures in the ulnar and ulnar medial views without obvious underlying joint effusions  or functional abnormality.  Negative for changes in the radial, radial lateral, or transverse views. Negative for synovitis or color Doppler enhancement in the left second MCP, third MCP, fourth MCP, fifth MCP, or first MCP joints.  Left wrist ultrasound was positive for color Doppler changes at the ulnar few without obvious surrounding synovitis or underlying structural change.  Negative for changes in the radial, radial lateral, ulnar medial, or transverse views. Right median nerve cross-sectional area measurement of .09 cm.  Left median nerve cross-sectional area of .07 cm. 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   Recent Labs: Lab Results  Component Value Date   WBC 6.8 08/25/2020   HGB 11.2 (L) 08/25/2020   PLT 356.0 08/25/2020   NA 138 08/25/2020   K 3.7 08/25/2020   CL 102 08/25/2020  CO2 29 08/25/2020   GLUCOSE 84 08/25/2020   BUN 20 08/25/2020   CREATININE 0.87 08/25/2020   BILITOT 0.2 08/25/2020   ALKPHOS 141 (H) 08/25/2020   AST 34 08/25/2020   ALT 28 08/25/2020   PROT 7.4 08/25/2020   ALBUMIN 4.4 08/25/2020   CALCIUM 9.2 08/25/2020   GFRAA 95 08/24/2016   QFTBGOLDPLUS NEGATIVE 07/23/2020    Speciality Comments: No specialty comments available.  Procedures:  No procedures performed Allergies: Patient has no known allergies.   Assessment / Plan:     Visit Diagnoses: Bilateral hand pain  Positive ANA (antinuclear antibody)  Arthralgia, unspecified joint - Plan: [place] COMPLETE JOINT SPACE STRUCTURES UP BILAT  Continued bilateral hand pain particularly in the fourth and fifth finger distribution and at the medial wrist there is no radiation to suggest a higher level impingement syndrome and had normal nerve conduction study.  Ultrasound inspection form does indicate probable  inflammation of the tendon structures in the medial wrist there was no history of any acute injury to explain this.  Will refer for AVISE CTD panel testing for further work-up. Colonoscopy was negative for any evidence of inflammatory bowel disease.  So far only specific extractable nuclear antibody positive is the smooth muscle I am not aware of inflammatory arthritis association with PSC.   Orders: Orders Placed This Encounter  Procedures   [place] COMPLETE JOINT SPACE STRUCTURES UP BILAT    No orders of the defined types were placed in this encounter.    Follow-Up Instructions: No follow-ups on file.   Collier Salina, MD  Note - This record has been created using [employer].  Chart creation errors have been sought, but may not always  have been located. Such creation errors do not reflect on  the standard of medical care.

## 2020-10-20 ENCOUNTER — Ambulatory Visit: Payer: Self-pay

## 2020-10-20 ENCOUNTER — Other Ambulatory Visit: Payer: Self-pay

## 2020-10-20 ENCOUNTER — Ambulatory Visit (INDEPENDENT_AMBULATORY_CARE_PROVIDER_SITE_OTHER): Payer: Managed Care, Other (non HMO) | Admitting: Internal Medicine

## 2020-10-20 ENCOUNTER — Encounter: Payer: Self-pay | Admitting: Internal Medicine

## 2020-10-20 VITALS — BP 110/76 | HR 101 | Ht 60.75 in | Wt 126.0 lb

## 2020-10-20 DIAGNOSIS — R768 Other specified abnormal immunological findings in serum: Secondary | ICD-10-CM

## 2020-10-20 DIAGNOSIS — M2550 Pain in unspecified joint: Secondary | ICD-10-CM

## 2020-10-20 DIAGNOSIS — M79642 Pain in left hand: Secondary | ICD-10-CM

## 2020-10-20 DIAGNOSIS — M79641 Pain in right hand: Secondary | ICD-10-CM | POA: Diagnosis not present

## 2020-10-27 ENCOUNTER — Ambulatory Visit: Payer: Managed Care, Other (non HMO) | Admitting: Internal Medicine

## 2020-11-11 ENCOUNTER — Encounter: Payer: Self-pay | Admitting: Internal Medicine

## 2020-11-13 NOTE — Telephone Encounter (Signed)
I discussed result with Alicia Carr ANA positive 1:160 speckled pattern otherwise entirely negative. With negative tests but her exam showing some tendon inflammation seem okay to just observe for now.  Please schedule an appointment in about 3 months for follow up.

## 2020-11-16 ENCOUNTER — Encounter: Payer: Self-pay | Admitting: Internal Medicine

## 2020-11-18 ENCOUNTER — Encounter: Payer: Self-pay | Admitting: Internal Medicine

## 2020-12-02 NOTE — Progress Notes (Signed)
Subjective:    Alicia Carr is a 40 y.o. female and is here for a comprehensive physical exam.  HPI  Health Maintenance Due  Topic Date Due   INFLUENZA VACCINE  09/28/2020    Acute Concerns: Immunization counseling -- Due to her past experience with shingles, she is concerned at contracting it again and is curious of when she would need to get the vaccine.   Vein concerns -- has had some issues where she has dull and achy veins in her hands, and has evidence of prominent vasculature when this happens. She has been experiencing discoloration of palms, digits, appearance of mottled skin on thighs, and decreased sensation to digits at times.   Chronic Issues: Post-herpetic neuralgia -- This past May, [name] was dx with a shingles outbreak on her face, that spread into her left eye. At the time of dx she was prescribed gabapentin 100 mg BID which was helpful, but she wanted to use it as least as possible. Shortly after tx she followed up with Dr. Leta Baptist ,Neurology, and was unable to pin point a cause for her newfound symptoms.   Arthralgia -- Since developing shingles, she has noticed her joints feel "achy" and stiff; mainly her elbows, hands, and knees. [name] has seen an rheumatologist, Dr. Benjamine Mola and hasn't tested positive for anything other than positive inflammation markers.   Primary sclerosing cholangitis -- Back in July of this year, she was dx with [facility] after experiencing GI issues such as constant upper left abdominal pain. She underwent a colonoscopy, which resulted as normal. Currently managing well.   Anemia -- Ms. Majer admits that she hasn't been great at taking her ferrous sulfate 325 mg lately, but when she was taking them consistently she has found that it hasn't helped her iron levels immensely.   Anxiety about health -- has had issues with ongoing chronic medical issues and possible long-term side effects. Sees therapist but not recently. Denies  SI/HI  Insulin resistance -- has had elevated HgbA1c of 5.7% in the past  Health Maintenance: Immunizations -- COVID- Last completed 01/03/20 Therapist, music) Influenza Vaccine- Last completed 11/29/19 Tdap- Last completed 11/29/19.  Colonoscopy- Last completed 10/09/20.  PAP- Last completed 11/29/19. Bone Density -- Scheduled 03/05/21.  Diet -- Hasn't been eating as well these past few month, but has been practicing portion control Sleep habits -- Takes a while to fall asleep due to muscle twitches. She can stay asleep once she falls asleep. Ophthalmology- Sees one regularly Dentistry- Not currently seeing one regularly Current Weight -- Stable; well controlled. Weight History:  Wt Readings from Last 3 Encounters:  12/03/20 124 lb (56.2 kg)  10/20/20 126 lb (57.2 kg)  10/09/20 122 lb (55.3 kg)    Mood -- Feeling stressed lately due to health concerns. Stopped seeing therapist due to busy schedule.  Alcohol Use- Occasionally 1 glass of wine on special days.  Patient's last menstrual period was 11/15/2020 (approximate). Period characteristics -- No change from baseline.  Birth control -- N/A    reports that she does not currently use alcohol.  Tobacco Use: Low Risk    Smoking Tobacco Use: Never   Smokeless Tobacco Use: Never     Depression screen PHQ 2/9 12/03/2020  Decreased Interest 0  Down, Depressed, Hopeless 0  PHQ - 2 Score 0     Other providers/specialists: Patient Care Team: Inda Coke, [place] as PCP - General (Physician Assistant)   PMHx, SurgHx, SocialHx, Medications, and Allergies were reviewed in the  Visit Navigator and updated as appropriate.   Past Medical History:  Diagnosis Date   Alcoholism ([place])    Anemia    Gallstones    History of chicken pox    Primary sclerosing cholangitis    Shingles    Shingles 07/18/2020   Vaginal delivery 2003     Past Surgical History:  Procedure Laterality Date   CESAREAN SECTION  08/08/2006   CHOLECYSTECTOMY   2003     Family History  Problem Relation Age of Onset   Mental illness Mother        paranoid schizophrenia   Hyperlipidemia Mother    Alcohol abuse Father    Colon polyps Father    Healthy Brother    Diabetes Paternal Aunt    Cancer Maternal Grandmother        uncertain type   Healthy Daughter    Healthy Son     Social History   Tobacco Use   Smoking status: Never   Smokeless tobacco: Never  Vaping Use   Vaping Use: Never used  Substance Use Topics   Alcohol use: Not Currently    Comment: Only on special occasions   Drug use: No    Review of Systems:   Review of Systems  Constitutional:  Negative for chills, fever, malaise/fatigue and weight loss.  HENT:  Negative for hearing loss, sinus pain and sore throat.   Respiratory:  Negative for cough and hemoptysis.   Cardiovascular:  Negative for chest pain, palpitations, leg swelling and PND.  Gastrointestinal:  Negative for abdominal pain, constipation, diarrhea, heartburn, nausea and vomiting.  Genitourinary:  Negative for dysuria, frequency and urgency.  Musculoskeletal:  Negative for back pain, myalgias and neck pain.  Skin:  Negative for itching and rash.  Neurological:  Negative for dizziness, tingling, seizures and headaches.  Endo/Heme/Allergies:  Negative for polydipsia.  Psychiatric/Behavioral:  Negative for depression. The patient is not nervous/anxious.   Negative unless otherwise specified per HPI.  Objective:   BP 100/66 (BP Location: Left Arm, Patient Position: Sitting, Cuff Size: Normal)   Pulse 92   Temp 98 F (36.7 C) (Temporal)   Ht 5\' 1"  (1.549 m)   Wt 124 lb (56.2 kg)   LMP 11/15/2020 (Approximate)   SpO2 100%   BMI 23.43 kg/m   General Appearance:    Alert, cooperative, no distress, appears stated age  Head:    Normocephalic, without obvious abnormality, atraumatic  Eyes:    PERRL, conjunctiva/corneas clear, EOM's intact, fundi    benign, both eyes  Ears:    Normal TM's and external  ear canals, both ears  Nose:   Nares normal, septum midline, mucosa normal, no drainage    or sinus tenderness  Throat:   Lips, mucosa, and tongue normal; teeth and gums normal  Neck:   Supple, symmetrical, trachea midline, no adenopathy;    thyroid:  no enlargement/tenderness/nodules; no carotid   bruit or JVD  Back:     Symmetric, no curvature, ROM normal, no CVA tenderness  Lungs:     Clear to auscultation bilaterally, respirations unlabored  Chest Wall:    No tenderness or deformity   Heart:    Regular rate and rhythm, S1 and S2 normal, no murmur, rub   or gallop  Breast Exam:    Deferred  Abdomen:     Soft, non-tender, bowel sounds active all four quadrants,    no masses, no organomegaly  Genitalia:    Deferred  Rectal:    Deferred  Extremities:   Extremities normal, atraumatic, no cyanosis or edema  Pulses:   2+ and symmetric all extremities  Skin:   Skin color, texture, turgor normal, no rashes or lesions  Lymph nodes:   Cervical, supraclavicular, and axillary nodes normal  Neurologic:   CNII-XII intact, normal strength, sensation and reflexes    throughout    Assessment/Plan:   Encounter for general adult medical examination with abnormal findings Today patient counseled on age appropriate routine health concerns for screening and prevention, each reviewed and up to date or declined. Immunizations reviewed and up to date or declined. Labs ordered and reviewed. Risk factors for depression reviewed and negative. Hearing function and visual acuity are intact. ADLs screened and addressed as needed. Functional ability and level of safety reviewed and appropriate. Education, counseling and referrals performed based on assessed risks today. Patient provided with a copy of personalized plan for preventive services.  PSC (primary sclerosing cholangitis) Compliant with Hepatology and GI. Further management per specialists.  Arthralgia, unspecified joint Further management per  rheumatology. Denies significant concerns today.  Vein symptom Referral to vein and vascular.  Insulin resistance Encouraged healthy eating and exercise  Anemia, unspecified type Update blood work and provide recommendations accordingly  Encounter for lipid screening for cardiovascular disease Update lipid panel and provide recommendations accordingly  Health anxiety Encouraged resumption of talk therapy   Patient Counseling:   [x]     Nutrition: Stressed importance of moderation in sodium/caffeine intake, saturated fat and cholesterol, caloric balance, sufficient intake of fresh fruits, vegetables, fiber, calcium, iron, and 1 mg of folate supplement per day (for females capable of pregnancy).   [x]      Stressed the importance of regular exercise.    [x]     Substance Abuse: Discussed cessation/primary prevention of tobacco, alcohol, or other drug use; driving or other dangerous activities under the influence; availability of treatment for abuse.    [x]      Injury prevention: Discussed safety belts, safety helmets, smoke detector, smoking near bedding or upholstery.    [x]      Sexuality: Discussed sexually transmitted diseases, partner selection, use of condoms, avoidance of unintended pregnancy  and contraceptive alternatives.    [x]     Dental health: Discussed importance of regular tooth brushing, flossing, and dental visits.   [x]      Health maintenance and immunizations reviewed. Please refer to Health maintenance section.   I,[name],acting as a Education administrator for [employer], PA.,have documented all relevant documentation on the behalf of Inda Coke, PA,as directed by  Inda Coke, PA while in the presence of Inda Coke, [place].  I, Inda Coke, [place], have reviewed all documentation for this visit. The documentation on 12/03/20 for the exam, diagnosis, procedures, and orders are all accurate and complete.   Inda Coke, PA-C [place]

## 2020-12-03 ENCOUNTER — Ambulatory Visit (INDEPENDENT_AMBULATORY_CARE_PROVIDER_SITE_OTHER): Payer: Managed Care, Other (non HMO) | Admitting: Physician Assistant

## 2020-12-03 ENCOUNTER — Encounter: Payer: Self-pay | Admitting: Physician Assistant

## 2020-12-03 ENCOUNTER — Other Ambulatory Visit: Payer: Self-pay

## 2020-12-03 VITALS — BP 100/66 | HR 92 | Temp 98.0°F | Ht 61.0 in | Wt 124.0 lb

## 2020-12-03 DIAGNOSIS — E8881 Metabolic syndrome: Secondary | ICD-10-CM | POA: Diagnosis not present

## 2020-12-03 DIAGNOSIS — R4589 Other symptoms and signs involving emotional state: Secondary | ICD-10-CM

## 2020-12-03 DIAGNOSIS — K8301 Primary sclerosing cholangitis: Secondary | ICD-10-CM

## 2020-12-03 DIAGNOSIS — R0989 Other specified symptoms and signs involving the circulatory and respiratory systems: Secondary | ICD-10-CM

## 2020-12-03 DIAGNOSIS — Z0001 Encounter for general adult medical examination with abnormal findings: Secondary | ICD-10-CM

## 2020-12-03 DIAGNOSIS — F418 Other specified anxiety disorders: Secondary | ICD-10-CM

## 2020-12-03 DIAGNOSIS — E88819 Insulin resistance, unspecified: Secondary | ICD-10-CM

## 2020-12-03 DIAGNOSIS — M2550 Pain in unspecified joint: Secondary | ICD-10-CM | POA: Diagnosis not present

## 2020-12-03 DIAGNOSIS — D649 Anemia, unspecified: Secondary | ICD-10-CM

## 2020-12-03 DIAGNOSIS — Z13220 Encounter for screening for lipoid disorders: Secondary | ICD-10-CM | POA: Diagnosis not present

## 2020-12-03 DIAGNOSIS — Z136 Encounter for screening for cardiovascular disorders: Secondary | ICD-10-CM | POA: Diagnosis not present

## 2020-12-03 DIAGNOSIS — Z23 Encounter for immunization: Secondary | ICD-10-CM

## 2020-12-03 LAB — COMPREHENSIVE METABOLIC PANEL
ALT: 22 U/L (ref 0–35)
AST: 26 U/L (ref 0–37)
Albumin: 4.3 g/dL (ref 3.5–5.2)
Alkaline Phosphatase: 100 U/L (ref 39–117)
BUN: 16 mg/dL (ref 6–23)
CO2: 26 mEq/L (ref 19–32)
Calcium: 9.5 mg/dL (ref 8.4–10.5)
Chloride: 105 mEq/L (ref 96–112)
Creatinine, Ser: 0.86 mg/dL (ref 0.40–1.20)
GFR: 85.33 mL/min (ref >60.00)
Glucose, Bld: 79 mg/dL (ref 70–99)
Potassium: 4.7 mEq/L (ref 3.5–5.1)
Sodium: 138 mEq/L (ref 135–145)
Total Bilirubin: 0.4 mg/dL (ref 0.2–1.2)
Total Protein: 7.2 g/dL (ref 6.0–8.3)

## 2020-12-03 LAB — CBC WITH DIFFERENTIAL/PLATELET
Basophils Absolute: 0 10*3/uL (ref 0.0–0.1)
Basophils Relative: 0.5 % (ref 0.0–3.0)
Eosinophils Absolute: 0 10*3/uL (ref 0.0–0.7)
Eosinophils Relative: 0.9 % (ref 0.0–5.0)
HCT: 36.2 % (ref 36.0–46.0)
Hemoglobin: 11.7 g/dL — ABNORMAL LOW (ref 12.0–15.0)
Lymphocytes Relative: 28.6 % (ref 12.0–46.0)
Lymphs Abs: 1.5 10*3/uL (ref 0.7–4.0)
MCHC: 32.3 g/dL (ref 30.0–36.0)
MCV: 82.6 fl (ref 78.0–100.0)
Monocytes Absolute: 0.3 10*3/uL (ref 0.1–1.0)
Monocytes Relative: 6.4 % (ref 3.0–12.0)
Neutro Abs: 3.3 10*3/uL (ref 1.4–7.7)
Neutrophils Relative %: 63.6 % (ref 43.0–77.0)
Platelets: 242 10*3/uL (ref 150.0–400.0)
RBC: 4.38 Mil/uL (ref 3.87–5.11)
RDW: 15.1 % (ref 11.5–15.5)
WBC: 5.2 10*3/uL (ref 4.0–10.5)

## 2020-12-03 LAB — LIPID PANEL
Cholesterol: 166 mg/dL (ref 0–200)
HDL: 77.8 mg/dL (ref >39.00)
LDL Cholesterol: 80 mg/dL (ref 0–99)
NonHDL: 88.12
Total CHOL/HDL Ratio: 2
Triglycerides: 43 mg/dL (ref 0.0–149.0)
VLDL: 8.6 mg/dL (ref 0.0–40.0)

## 2020-12-03 LAB — HEMOGLOBIN A1C: Hgb A1c MFr Bld: 5.3 % (ref 4.6–6.5)

## 2020-12-03 NOTE — Patient Instructions (Addendum)
It was great to see you!  Please reach out to your therapist  Referral today for vascular -- they will contact you  Please go to the lab for blood work.   Our office will call you with your results unless you have chosen to receive results via MyChart.  If your blood work is normal we will follow-up each year for physicals and as scheduled for chronic medical problems.  If anything is abnormal we will treat accordingly and get you in for a follow-up.  Take care,  [name]

## 2020-12-04 LAB — IRON,TIBC AND FERRITIN PANEL
%SAT: 18 % (calc) (ref 16–45)
Ferritin: 15 ng/mL — ABNORMAL LOW (ref 16–154)
Iron: 85 ug/dL (ref 40–190)
TIBC: 472 mcg/dL (calc) — ABNORMAL HIGH (ref 250–450)

## 2020-12-07 ENCOUNTER — Encounter: Payer: Self-pay | Admitting: Physician Assistant

## 2020-12-07 NOTE — Telephone Encounter (Signed)
Please see message regarding lab work.

## 2020-12-10 ENCOUNTER — Ambulatory Visit
Admission: RE | Admit: 2020-12-10 | Discharge: 2020-12-10 | Disposition: A | Discharge status: Home / Self Care | Payer: Managed Care, Other (non HMO) | Source: Ambulatory Visit | Attending: Physician Assistant | Admitting: Physician Assistant

## 2020-12-10 ENCOUNTER — Other Ambulatory Visit: Payer: Self-pay

## 2020-12-10 DIAGNOSIS — E559 Vitamin D deficiency, unspecified: Secondary | ICD-10-CM

## 2020-12-14 ENCOUNTER — Other Ambulatory Visit: Payer: Self-pay | Admitting: Family Medicine

## 2020-12-17 NOTE — Progress Notes (Signed)
[facility] [place]  ASSESSMENT / PLAN: Alicia Carr is a 40 y.o. female with malaise, livideo recticularis, prominent veins, multiple positive ANA titers, recent shingles infection, Raynaud's phenomenon.   CHIEF COMPLAINT: prominent veins, painful joints, rash, malaise  HISTORY OF PRESENT ILLNESS: Alicia Carr is a 40 y.o. female who presents to the office for evaluation of multiple complaints.  She reports she developed shingles infection several months ago, and has had several issues develop since.  She is previously healthy.  She reports prominent, achy veins across her neck and hands.  These come and go.  There does not seem to be any provoking or palliating factors.  She is also noticed a lacy rash across her anterior thighs.  She has never seen this rash before.  It also does not seem to have a pattern.  She reports some general constitutional symptoms as well including malaise.  She is seeing a rheumatologist and the only positive finding has been a positive antinuclear antibody titer. She does report some typical Raynaud's symptoms.  Past Medical History:  Diagnosis Date   Alcoholism ([place])    Anemia    Gallstones    History of chicken pox    Primary sclerosing cholangitis    Shingles    Shingles 07/18/2020   Vaginal delivery 2003    Past Surgical History:  Procedure Laterality Date   CESAREAN SECTION  08/08/2006   CHOLECYSTECTOMY  2003    Family History  Problem Relation Age of Onset   Mental illness Mother        paranoid schizophrenia   Hyperlipidemia Mother    Alcohol abuse Father    Colon polyps Father    Healthy Brother    Diabetes Paternal Aunt    Cancer Maternal Grandmother        uncertain type   Healthy Daughter    Healthy Son    Asthma Daughter    Learning disabilities Daughter     Social History   Socioeconomic History   Marital status: Married    Spouse name: Merrilee Seashore   Number of children: 2   Years of education:  Not on file   Highest education level: Bachelor's degree (e.g., BA, AB, BS)  Occupational History   Occupation: Field seismologist  Tobacco Use   Smoking status: Never   Smokeless tobacco: Never  Vaping Use   Vaping Use: Never used  Substance and Sexual Activity   Alcohol use: Yes    Comment: Sparingly: special occasions only; 1 glass   Drug use: No   Sexual activity: Yes    Birth control/protection: None    Comment: My husband cannot have children  Other Topics Concern   Not on file  Social History Narrative   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, [name]   1 son born 2003 1 daughter born 2008   Alcohol use regular for 10+ years stopped 01/18/2020 was 2 to 3 glasses of wine daily   No drug use   Coffee and tea 2 or 3 a day for caffeine   Social Determinants of Health   Financial Resource Strain: Not on file  Food Insecurity: Not on file  Transportation Needs: Not on file  Physical Activity: Not on file  Stress: Not on file  Social Connections: Not on file  Intimate Partner Violence: Not on file    No Known Allergies  Current Outpatient Medications  Medication Sig Dispense Refill   clindamycin (CLINDAGEL)  1 % gel Apply topically as needed.     ferrous sulfate 325 (65 FE) MG EC tablet Take 1 tablet (325 mg total) by mouth daily with breakfast.  3   prednisoLONE acetate (PRED FORTE) 1 % ophthalmic suspension      Vitamin D, Ergocalciferol, (DRISDOL) 1.25 MG (50000 UNIT) CAPS capsule Take 1 capsule (50,000 Units total) by mouth every 7 (seven) days. 12 capsule 0   No current facility-administered medications for this visit.    REVIEW OF SYSTEMS:  [X]  denotes positive finding, [ ]  denotes negative finding Cardiac  Comments:  Chest pain or chest pressure:    Shortness of breath upon exertion:    Short of breath when lying flat:    Irregular heart rhythm:        Vascular    Pain in calf, thigh, or hip brought on by ambulation:    Pain  in feet at night that wakes you up from your sleep:     Blood clot in your veins:    Leg swelling:         Pulmonary    Oxygen at home:    Productive cough:     Wheezing:         Neurologic    Sudden weakness in arms or legs:     Sudden numbness in arms or legs:     Sudden onset of difficulty speaking or slurred speech:    Temporary loss of vision in one eye:     Problems with dizziness:         Gastrointestinal    Blood in stool:     Vomited blood:         Genitourinary    Burning when urinating:     Blood in urine:        Psychiatric    Major depression:         Hematologic    Bleeding problems:    Problems with blood clotting too easily:        Skin    Rashes or ulcers:        Constitutional    Fever or chills:      PHYSICAL EXAM Vitals:   12/18/20 0926  BP: 111/73  Pulse: 89  Resp: 20  Temp: 98.3 F (36.8 C)  SpO2: 100%  Weight: 125 lb (56.7 kg)  Height: 5\' 1"  (1.549 m)    Constitutional: well appearing. no distress. Appears well nourished.  Neurologic: CN intact. no focal findings. no sensory loss. Psychiatric:  Mood and affect symmetric and appropriate. Eyes:  No icterus. No conjunctival pallor. Ears, nose, throat:  mucous membranes moist. Midline trachea.  Cardiac: regular rate and rhythm.  Respiratory:  unlabored. Abdominal:  soft, non-tender, non-distended.  Peripheral vascular: 2+ radial pulses. Prominent jugular veins. Veins about the hands not prominent. Mild palmar erythema.  Extremity: no edema. no cyanosis. no pallor.  Skin: no gangrene. no ulceration.  Lymphatic: no Stemmer's sign. no palpable lymphadenopathy.  PERTINENT LABORATORY AND RADIOLOGIC DATA  Most recent CBC CBC Latest Ref Rng & Units 12/03/2020 08/25/2020 06/10/2020  WBC 4.0 - 10.5 K/uL 5.2 6.8 6.0  Hemoglobin 12.0 - 15.0 g/dL 11.7(L) 11.2(L) 10.1(L)  Hematocrit 36.0 - 46.0 % 36.2 34.5(L) 32.7(L)  Platelets 150.0 - 400.0 K/uL 242.0 356.0 319     Most recent CMP CMP  Latest Ref Rng & Units 12/03/2020 08/25/2020 07/22/2020  Glucose 70 - 99 mg/dL 79 84 -  BUN 6 - 23 mg/dL 16 20 -  Creatinine 0.40 - 1.20 mg/dL 0.86 0.87 -  Sodium 135 - 145 mEq/L 138 138 -  Potassium 3.5 - 5.1 mEq/L 4.7 3.7 -  Chloride 96 - 112 mEq/L 105 102 -  CO2 19 - 32 mEq/L 26 29 -  Calcium 8.4 - 10.5 mg/dL 9.5 9.2 -  Total Protein 6.0 - 8.3 g/dL 7.2 7.4 7.3  Total Bilirubin 0.2 - 1.2 mg/dL 0.4 0.2 0.4  Alkaline Phos 39 - 117 U/L 100 141(H) 158(H)  AST 0 - 37 U/L 26 34 44(H)  ALT 0 - 35 U/L 22 28 50(H)    Renal function Estimated Creatinine Clearance: 66.3 mL/min (by C-G formula based on SCr of 0.86 mg/dL).  Hgb A1c MFr Bld (%)  Date Value  12/03/2020 5.3    LDL Cholesterol (Calc)  Date Value Ref Range Status  12/02/2019 136 (H) mg/dL (calc) Final    Comment:    Reference range: <100 . Desirable range <100 mg/dL for primary prevention;   <70 mg/dL for patients with CHD or diabetic patients  with > or = 2 CHD risk factors. Marland Kitchen LDL-C is now calculated using the Martin-Hopkins  calculation, which is a validated novel method providing  better accuracy than the Friedewald equation in the  estimation of LDL-C.  Cresenciano Genre et al. Annamaria Helling. 6381;771(16): 2061-2068  (http://education.[facility].com/faq/FAQ164)    LDL Cholesterol  Date Value Ref Range Status  12/03/2020 80 0 - 99 mg/dL Final    Yevonne Aline. Stanford Breed, MD [facility] Phone Number: [phone] 12/18/2020 11:24 AM  Total time spent on preparing this encounter including chart review, data review, collecting history, examining the patient, coordinating care for this new patient, 45 minutes.  Portions of this report may have been transcribed using voice recognition software.  Every effort has been made to ensure accuracy; however, inadvertent computerized transcription errors may still be present.

## 2020-12-18 ENCOUNTER — Ambulatory Visit (INDEPENDENT_AMBULATORY_CARE_PROVIDER_SITE_OTHER): Payer: Managed Care, Other (non HMO) | Admitting: Vascular Surgery

## 2020-12-18 ENCOUNTER — Other Ambulatory Visit: Payer: Self-pay

## 2020-12-18 ENCOUNTER — Encounter: Payer: Self-pay | Admitting: Vascular Surgery

## 2020-12-18 VITALS — BP 111/73 | HR 89 | Temp 98.3°F | Resp 20 | Ht 61.0 in | Wt 125.0 lb

## 2020-12-18 DIAGNOSIS — R5383 Other fatigue: Secondary | ICD-10-CM | POA: Diagnosis not present

## 2020-12-18 DIAGNOSIS — R5381 Other malaise: Secondary | ICD-10-CM

## 2020-12-21 ENCOUNTER — Encounter: Payer: Self-pay | Admitting: Physician Assistant

## 2020-12-21 NOTE — Telephone Encounter (Signed)
Please see message and advise. Last VIT D was done 07/2020 was normal at 50.15.

## 2021-02-03 NOTE — Progress Notes (Signed)
Office Visit Note  [name]             Date of Birth: 1980-05-24           MRN: [id number]             PCP: Inda Coke, PA Referring: Inda Coke, PA Visit Date: 02/04/2021   Subjective:  Follow-up (Doing good)   History of Present Illness: Alicia Carr is a 40 y.o. female here for follow up with abnormal labs and multiple generalized symptoms.  Since last visit she is noticing intermittent joint pains in multiple sites not associated with significant swelling most recently in the elbows.  She has continued episodes of tingling or numbness in the fingertips and toes.  Episodes of muscle twitching that come and go and she spent 1 episode with 3 days of low back pain and stiffness unable to fully extend.  She saw vascular surgery for symptoms including livedo reticularis exam suggested findings typical for Raynaud's no other major problem identified.   Previous HPI 10/20/20 Alicia Carr is a 40 y.o. female here for follow up for joint pain and intermittent numbness, skin rashes with recent shingles, and peripheral swelling. Lab testing at our initial visit was negative for any specific autoantibody markers.  She continues with the joint pains and numbness especially affecting her medial hand along the fourth and fifth fingers and at the wrist.  She has not noticed large amounts of joint swelling since her last visit.  She does continue to experience generalized symptoms with fatigue itching and concentration difficulty.   Previous HPI 10/06/20 Alicia Carr is a 40 y.o. female here with joint pains and numbness affecting bilateral fingers and toes that is worse especially in the past few months.  She developed shingles outbreak on the face and left eye in May this is treated and symptoms have improved besides some residual hyperpigmentation.  However also slightly earlier this year was recently diagnosed with [place] by biopsy during evaluation of abdominal  symptoms and abnormal liver function tests.  She reports morning stiffness difficulty gripping her hands tightly or performing tasks first thing in the morning this improves some time and is normal by the afternoon.  She notices occasional swelling or puffiness especially in the feet but not all the time.  She denies discoloration such as pallor, erythema, or cyanosis.  She tried taking oral diclofenac for joint pains but developed an episode of painless hematochezia so stopped this medication. She denies any lymphadenopathy, photosensitive rashes, or history of blood clots.     Labs reviewed 06/2020 ANA 1:80 homogenous ASMA neg ESR 48 CRP wnl HAV/HBV/HCV neg TB neg   05/2020 Iron 24 TIBC 472 Sat 5% Ferritin 4   Review of Systems  Constitutional:  Positive for fatigue.  HENT:  Negative for mouth dryness.   Eyes:  Negative for dryness.  Respiratory:  Negative for shortness of breath.   Cardiovascular:  Negative for swelling in legs/feet.  Gastrointestinal:  Positive for constipation and diarrhea.  Endocrine: Positive for excessive thirst and increased urination.  Genitourinary:  Positive for difficulty urinating and painful urination.  Musculoskeletal:  Positive for joint pain, joint pain and morning stiffness.  Skin:  Negative for rash.  Allergic/Immunologic: Negative for susceptible to infections.  Neurological:  Positive for numbness.  Hematological:  Positive for bruising/bleeding tendency.  Psychiatric/Behavioral:  Positive for sleep disturbance.    PMFS History:  Patient Active Problem List   Diagnosis Date Noted  Raynaud's syndrome without gangrene 02/04/2021   Positive ANA (antinuclear antibody) 10/06/2020   Arthralgia 10/06/2020   Primary sclerosing cholangitis 09/04/2020   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 cause 06/18/2020    Past Medical History:  Diagnosis Date   Alcoholism ([place])    Anemia    Gallstones    History of chicken pox    Primary  sclerosing cholangitis    Shingles    Shingles 07/18/2020   Vaginal delivery 2003    Family History  Problem Relation Age of Onset   Mental illness Mother        paranoid schizophrenia   Hyperlipidemia Mother    Alcohol abuse Father    Colon polyps Father    Healthy Brother    Diabetes Paternal Aunt    Cancer Maternal Grandmother        uncertain type   Healthy Daughter    Healthy Son    Asthma Daughter    Learning disabilities Daughter    Past Surgical History:  Procedure Laterality Date   CESAREAN SECTION  08/08/2006   CHOLECYSTECTOMY  2003   Social History   Social History Narrative   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, [name]   1 son born 2003 1 daughter born 2008   Alcohol use regular for 10+ years stopped 01/18/2020 was 2 to 3 glasses of wine daily   No drug use   Coffee and tea 2 or 3 a day for caffeine   Immunization History  Administered Date(s) Administered   Hep A / Hep B 09/08/2020, 10/12/2020   Influenza,inj,Quad PF,6+ Mos 12/26/2018, 11/29/2019, 12/03/2020   Influenza,inj,quad, With Preservative 11/29/2019   PFIZER(Purple Top)SARS-COV-2 Vaccination 05/02/2019, 05/23/2019, 01/03/2020   Tdap 05/10/2006, 11/29/2019     Objective: Vital Signs: BP 90/60 (BP Location: Left Arm, Patient Position: Sitting, Cuff Size: Normal)   Pulse 93   Resp 14   Ht 5' 0.75" (1.543 m)   Wt 128 lb (58.1 kg)   BMI 24.38 kg/m    Physical Exam Eyes:     Conjunctiva/sclera: Conjunctivae normal.  Cardiovascular:     Rate and Rhythm: Normal rate and regular rhythm.  Pulmonary:     Effort: Pulmonary effort is normal.     Breath sounds: Normal breath sounds.  Skin:    General: Skin is warm and dry.     Findings: No rash.     Comments: Nailfold capillaries tortuous, microhemorrhages  Psychiatric:        Mood and Affect: Mood normal.     Musculoskeletal Exam:  Neck full ROM no tenderness Shoulders full ROM no tenderness or swelling Elbows  hyperextensible ROM no tenderness or swelling Wrists full ROM no tenderness or swelling Fingers hyperextensible ROM no tenderness or swelling Knees full ROM no tenderness or swelling Ankles full ROM no tenderness or swelling    Investigation: No additional findings.  Imaging: No results found.  Recent Labs: Lab Results  Component Value Date   WBC 5.2 12/03/2020   HGB 11.7 (L) 12/03/2020   PLT 242.0 12/03/2020   NA 138 12/03/2020   K 4.7 12/03/2020   CL 105 12/03/2020   CO2 26 12/03/2020   GLUCOSE 79 12/03/2020   BUN 16 12/03/2020   CREATININE 0.86 12/03/2020   BILITOT 0.4 12/03/2020   ALKPHOS 100 12/03/2020   AST 26 12/03/2020   ALT 22 12/03/2020   PROT 7.2 12/03/2020   ALBUMIN 4.3 12/03/2020   CALCIUM 9.5 12/03/2020  GFRAA 95 08/24/2016   QFTBGOLDPLUS NEGATIVE 07/23/2020    Speciality Comments: No specialty comments available.  Procedures:  No procedures performed Allergies: Patient has no known allergies.   Assessment / Plan:     Visit Diagnoses: Positive ANA (antinuclear antibody) - Plan: Sedimentation rate, C-reactive protein  Nonspecific serology previously with high inflammatory markers but normalized and more recent labs we will repeat ESR and CRP today for evidence of inflammatory disease activity.  Discussed possible options of empiric treatment with immunomodulatory medication but recommending symptomatic management without other evidence.  Raynaud's syndrome without gangrene - Plan: amLODipine (NORVASC) 5 MG tablet  She describes ongoing Raynaud's symptoms with peripheral discoloration and stiffness not sure if the paresthesia feelings are related to circulation issue.  We will try starting amlodipine 5 mg daily for Raynaud's.  Discussed watching out for orthostatic hypotension peripheral edema she has borderline low blood pressure at some clinic visits.  Arthralgia, unspecified joint   Continued intermittent arthralgia of multiple sites nothing  again on exam besides joint hypermobility could be contributory towards episodic joint pains also repeating inflammatory work-up as above.   Orders: Orders Placed This Encounter  Procedures   Sedimentation rate   C-reactive protein    Meds ordered this encounter  Medications   amLODipine (NORVASC) 5 MG tablet    Sig: Take 1 tablet (5 mg total) by mouth daily.    Dispense:  30 tablet    Refill:  2      Follow-Up Instructions: Return in about 2 months (around 04/07/2021) for ANA/PSC/Raynauds CCB start f/u 79mo.   CCollier Salina MD  Note - This record has been created using [employer]  Chart creation errors have been sought, but may not always  have been located. Such creation errors do not reflect on  the standard of medical care.

## 2021-02-04 ENCOUNTER — Ambulatory Visit (INDEPENDENT_AMBULATORY_CARE_PROVIDER_SITE_OTHER): Payer: Managed Care, Other (non HMO) | Admitting: Internal Medicine

## 2021-02-04 ENCOUNTER — Other Ambulatory Visit: Payer: Self-pay

## 2021-02-04 ENCOUNTER — Encounter: Payer: Self-pay | Admitting: Internal Medicine

## 2021-02-04 VITALS — BP 90/60 | HR 93 | Resp 14 | Ht 60.75 in | Wt 128.0 lb

## 2021-02-04 DIAGNOSIS — I7300 Raynaud's syndrome without gangrene: Secondary | ICD-10-CM | POA: Insufficient documentation

## 2021-02-04 DIAGNOSIS — R768 Other specified abnormal immunological findings in serum: Secondary | ICD-10-CM

## 2021-02-04 DIAGNOSIS — M2550 Pain in unspecified joint: Secondary | ICD-10-CM | POA: Diagnosis not present

## 2021-02-04 MED ORDER — AMLODIPINE BESYLATE 5 MG PO TABS: 5.0000 mg | ORAL_TABLET | Freq: Every day | ORAL | 2 refills | Status: DC

## 2021-02-05 ENCOUNTER — Encounter: Payer: Self-pay | Admitting: Internal Medicine

## 2021-02-05 LAB — SEDIMENTATION RATE: Sed Rate: 11 mm/h (ref 0–20)

## 2021-02-05 LAB — C-REACTIVE PROTEIN: CRP: 15.2 mg/L — ABNORMAL HIGH (ref <8.0)

## 2021-02-18 ENCOUNTER — Telehealth: Payer: Self-pay

## 2021-02-18 NOTE — Telephone Encounter (Signed)
-----   Message from [place] sent at 10/12/2020  9:47 AM EDT ----- Regarding: hep injection Needs her 3 rd hep injection   #1 09-08-2020 #2 10-12-2020 3rd 6 months from day 1  Due Jan 2023

## 2021-02-18 NOTE — Telephone Encounter (Signed)
Patient has set up her vaccine appointment for 03/12/21. She will be getting the following to replace the #3 Twinrix: Hep A and Hep B peds dose x 2 to equal an adult dosage. Reminder-bill the Hep B ped twice. Orders in.

## 2021-03-01 ENCOUNTER — Encounter: Payer: Self-pay | Admitting: Internal Medicine

## 2021-03-05 ENCOUNTER — Other Ambulatory Visit: Payer: Managed Care, Other (non HMO)

## 2021-03-09 ENCOUNTER — Other Ambulatory Visit: Payer: Self-pay | Admitting: Nurse Practitioner

## 2021-03-09 DIAGNOSIS — R1012 Left upper quadrant pain: Secondary | ICD-10-CM

## 2021-03-09 DIAGNOSIS — K8301 Primary sclerosing cholangitis: Secondary | ICD-10-CM

## 2021-03-12 ENCOUNTER — Ambulatory Visit
Admission: RE | Admit: 2021-03-12 | Discharge: 2021-03-12 | Disposition: A | Discharge status: Home / Self Care | Payer: Managed Care, Other (non HMO) | Source: Ambulatory Visit | Attending: Nurse Practitioner | Admitting: Nurse Practitioner

## 2021-03-12 ENCOUNTER — Ambulatory Visit (INDEPENDENT_AMBULATORY_CARE_PROVIDER_SITE_OTHER): Payer: Managed Care, Other (non HMO) | Admitting: Internal Medicine

## 2021-03-12 DIAGNOSIS — R1012 Left upper quadrant pain: Secondary | ICD-10-CM

## 2021-03-12 DIAGNOSIS — K8301 Primary sclerosing cholangitis: Secondary | ICD-10-CM

## 2021-03-12 DIAGNOSIS — Z23 Encounter for immunization: Secondary | ICD-10-CM | POA: Diagnosis not present

## 2021-03-16 NOTE — Progress Notes (Incomplete)
Alicia Carr is a 41 y.o. female here for nephrolithiasis.  SCRIBE STATEMENT  History of Present Illness:   No chief complaint on file.   HPI  Nephrolithiasis Alicia Carr presents with ***.  Past Medical History:  Diagnosis Date   Alcoholism ([place])    Anemia    Gallstones    History of chicken pox    Primary sclerosing cholangitis    Shingles    Shingles 07/18/2020   Vaginal delivery 2003     Social History   Tobacco Use   Smoking status: Never   Smokeless tobacco: Never  Vaping Use   Vaping Use: Never used  Substance Use Topics   Alcohol use: Yes    Comment: Sparingly: special occasions only; 1 glass   Drug use: No    Past Surgical History:  Procedure Laterality Date   CESAREAN SECTION  08/08/2006   CHOLECYSTECTOMY  2003    Family History  Problem Relation Age of Onset   Mental illness Mother        paranoid schizophrenia   Hyperlipidemia Mother    Alcohol abuse Father    Colon polyps Father    Healthy Brother    Diabetes Paternal Aunt    Cancer Maternal Grandmother        uncertain type   Healthy Daughter    Healthy Son    Asthma Daughter    Learning disabilities Daughter     No Known Allergies  Current Medications:   Current Outpatient Medications:    amLODipine (NORVASC) 5 MG tablet, Take 1 tablet (5 mg total) by mouth daily., Disp: 30 tablet, Rfl: 2   Cholecalciferol (VITAMIN D3) 125 MCG (5000 UT) TBDP, Take by mouth., Disp: , Rfl:    clindamycin (CLINDAGEL) 1 % gel, Apply topically as needed., Disp: , Rfl:    ferrous sulfate 325 (65 FE) MG EC tablet, Take 1 tablet (325 mg total) by mouth daily with breakfast., Disp: , Rfl: 3   loteprednol (LOTEMAX) 0.5 % ophthalmic suspension, SMARTSIG:In Eye(s), Disp: , Rfl:    prednisoLONE acetate (PRED FORTE) 1 % ophthalmic suspension, , Disp: , Rfl:    Vitamin D, Ergocalciferol, (DRISDOL) 1.25 MG (50000 UNIT) CAPS capsule, Take 1 capsule (50,000 Units total) by mouth every 7 (seven) days. (Patient not  taking: Reported on 02/04/2021), Disp: 12 capsule, Rfl: 0   Review of Systems:   ROS Negative unless otherwise specified per HPI. Vitals:   There were no vitals filed for this visit.   There is no height or weight on file to calculate BMI.  Physical Exam:   Physical Exam  Assessment and Plan:        I,Alicia Carr,acting as a scribe for Alicia Coke, PA.,have documented all relevant documentation on the behalf of Alicia Coke, PA,as directed by  Alicia Coke, PA while in the presence of Alicia Carr, [place].  ***  Alicia Coke, PA-C

## 2021-03-17 ENCOUNTER — Ambulatory Visit (INDEPENDENT_AMBULATORY_CARE_PROVIDER_SITE_OTHER): Payer: Managed Care, Other (non HMO)

## 2021-03-17 ENCOUNTER — Other Ambulatory Visit: Payer: Self-pay | Admitting: Physician Assistant

## 2021-03-17 ENCOUNTER — Ambulatory Visit (HOSPITAL_COMMUNITY)
Admission: RE | Admit: 2021-03-17 | Discharge: 2021-03-17 | Disposition: A | Discharge status: Home / Self Care | Payer: Managed Care, Other (non HMO) | Source: Ambulatory Visit | Attending: Physician Assistant | Admitting: Physician Assistant

## 2021-03-17 ENCOUNTER — Ambulatory Visit: Payer: Managed Care, Other (non HMO) | Admitting: Physician Assistant

## 2021-03-17 ENCOUNTER — Encounter: Payer: Self-pay | Admitting: Physician Assistant

## 2021-03-17 ENCOUNTER — Other Ambulatory Visit: Payer: Self-pay

## 2021-03-17 VITALS — BP 110/80 | HR 101 | Temp 98.0°F | Ht 60.75 in | Wt 128.0 lb

## 2021-03-17 DIAGNOSIS — R002 Palpitations: Secondary | ICD-10-CM | POA: Diagnosis not present

## 2021-03-17 DIAGNOSIS — R3129 Other microscopic hematuria: Secondary | ICD-10-CM

## 2021-03-17 DIAGNOSIS — R1012 Left upper quadrant pain: Secondary | ICD-10-CM

## 2021-03-17 DIAGNOSIS — R93429 Abnormal radiologic findings on diagnostic imaging of unspecified kidney: Secondary | ICD-10-CM | POA: Diagnosis present

## 2021-03-17 DIAGNOSIS — R935 Abnormal findings on diagnostic imaging of other abdominal regions, including retroperitoneum: Secondary | ICD-10-CM

## 2021-03-17 LAB — URINALYSIS, ROUTINE W REFLEX MICROSCOPIC
Bilirubin Urine: NEGATIVE
Ketones, ur: NEGATIVE
Leukocytes,Ua: NEGATIVE
Nitrite: NEGATIVE
Specific Gravity, Urine: 1.02 (ref 1.000–1.030)
Total Protein, Urine: NEGATIVE
Urine Glucose: NEGATIVE
Urobilinogen, UA: 0.2 (ref 0.0–1.0)
pH: 6 (ref 5.0–8.0)

## 2021-03-17 LAB — BASIC METABOLIC PANEL
BUN: 17 mg/dL (ref 6–23)
CO2: 26 mEq/L (ref 19–32)
Calcium: 9.2 mg/dL (ref 8.4–10.5)
Chloride: 104 mEq/L (ref 96–112)
Creatinine, Ser: 0.77 mg/dL (ref 0.40–1.20)
GFR: 97.24 mL/min (ref >60.00)
Glucose, Bld: 81 mg/dL (ref 70–99)
Potassium: 4 mEq/L (ref 3.5–5.1)
Sodium: 138 mEq/L (ref 135–145)

## 2021-03-17 NOTE — Patient Instructions (Signed)
It was great to see you!  We are going to get a CT scan to further characterize the abnormal finding of your kidney ultrasound I will be in touch with these results  We will update your blood work and urine today  I have also ordered an echocardiogram (ultrasound of your heart) and a heart monitor for you to wear to evaluate your palpitations. The cardiology office will contact you about these things.  Let's follow-up after these tests are completed, sooner if you have concerns.  If a referral was placed today, you will be contacted for an appointment. Please note that routine referrals can sometimes take up to 3-4 weeks to process. Please call our office if you haven't heard anything after this time frame.  Take care,  Inda Coke PA-C

## 2021-03-17 NOTE — Progress Notes (Unsigned)
Enrolled for [employer] to mail a ZIO XT long term holter monitor to the patients address on file.  

## 2021-03-17 NOTE — Progress Notes (Signed)
Alicia Carr is a 41 y.o. female here for a new problem.  History of Present Illness:   Chief Complaint  Patient presents with   Abdominal Pain    Pt has been having pressure on lett upper side of abdomen for a long time. Pt had and U/S of abdomen complete and it showed something in her  left kidney.    HPI  Abnormal renal ultrasound; LUQ pressure Saw Roosevelt Locks, NP, her liver specialist on 03/08/21. Had an abdominal ultrasound 03/12/21 ordered by her that showed  "a 5.2 mm shadowing echogenic focus seen within the mid left kidney." I do not have access to this ultrasound finding but she has this on her phone and has shown me the results. The radiologist reading the result recommended following up on this finding with CT stone protocol. Patient is interested in this today. Denies prior hx of kidney stones. Has had some intermittent issues with slow voiding, or feeling like she has to push to void. She brought this up with her gynecologist who seemed overall unconcerned regarding this. Denies painful urination or gross hematuria.  Has ongoing LUQ pressure that is always there and waxes and wanes. This has been going on for a year or so. Has occasional sensation that goes up to her L shoulder blade at times. Feels like "referred pain" sensation similar to the pain she experienced after her liver bx.   Palpitations Having ongoing symptoms. Has been going on for at least a few months. Became more pronounced after starting norvasc 5 mg daily by Dr. Benjamine Mola on 02/04/21 for her Raynaud's.  She is getting palpitations a few times per week.  Denies shortness of breath with this.  She does at times have central chest pressure on her sternum.  This is not exertional.   She is concerned about her heart because she continues to have ongoing issues with what she feels like is unusual circulation in her hands.  She continues to see her rheumatologist for this and does not feel like the Norvasc has helped her  symptoms.  She also saw a vascular specialist, Dr. Stanford Breed, about this and she states that they also did not seem very concerned about her symptoms.  They did a brief physical exam and did not note any significant unusual findings per her report.     Past Medical History:  Diagnosis Date   Alcoholism ([place])    Anemia    Gallstones    History of chicken pox    Primary sclerosing cholangitis    Shingles    Shingles 07/18/2020   Vaginal delivery 2003     Social History   Tobacco Use   Smoking status: Never   Smokeless tobacco: Never  Vaping Use   Vaping Use: Never used  Substance Use Topics   Alcohol use: Yes    Comment: Sparingly: special occasions only; 1 glass   Drug use: No    Past Surgical History:  Procedure Laterality Date   CESAREAN SECTION  08/08/2006   CHOLECYSTECTOMY  2003    Family History  Problem Relation Age of Onset   Mental illness Mother        paranoid schizophrenia   Hyperlipidemia Mother    Alcohol abuse Father    Colon polyps Father    Healthy Brother    Diabetes Paternal Aunt    Cancer Maternal Grandmother        uncertain type   Healthy Daughter    Healthy Son  Asthma Daughter    Learning disabilities Daughter     No Known Allergies  Current Medications:   Current Outpatient Medications:    amLODipine (NORVASC) 5 MG tablet, Take 1 tablet (5 mg total) by mouth daily., Disp: 30 tablet, Rfl: 2   Cholecalciferol (VITAMIN D3) 125 MCG (5000 UT) TBDP, Take by mouth., Disp: , Rfl:    clindamycin (CLINDAGEL) 1 % gel, Apply topically as needed., Disp: , Rfl:    ferrous sulfate 325 (65 FE) MG EC tablet, Take 1 tablet (325 mg total) by mouth daily with breakfast. (Patient taking differently: Take 325 mg by mouth every other day.), Disp: , Rfl: 3   loteprednol (LOTEMAX) 0.5 % ophthalmic suspension, SMARTSIG:In Eye(s), Disp: , Rfl:    Review of Systems:   ROS Negative unless otherwise specified per  HPI.  Vitals:   Vitals:   03/17/21 0930  BP: 110/80  Pulse: (!) 101  Temp: 98 F (36.7 C)  TempSrc: Temporal  SpO2: 100%  Weight: 128 lb (58.1 kg)  Height: 5' 0.75" (1.543 m)     Body mass index is 24.38 kg/m.  Physical Exam:   Physical Exam Vitals and nursing note reviewed.  Constitutional:      General: She is not in acute distress.    Appearance: She is well-developed. She is not ill-appearing or toxic-appearing.  Cardiovascular:     Rate and Rhythm: Normal rate and regular rhythm.     Pulses: Normal pulses.     Heart sounds: Normal heart sounds, S1 normal and S2 normal.  Pulmonary:     Effort: Pulmonary effort is normal.     Breath sounds: Normal breath sounds.  Abdominal:     General: Abdomen is flat. Bowel sounds are normal.     Palpations: Abdomen is soft.     Tenderness: There is no abdominal tenderness. There is no right CVA tenderness or left CVA tenderness.  Skin:    General: Skin is warm and dry.  Neurological:     Mental Status: She is alert.     GCS: GCS eye subscore is 4. GCS verbal subscore is 5. GCS motor subscore is 6.  Psychiatric:        Speech: Speech normal.        Behavior: Behavior normal. Behavior is cooperative.    Assessment and Plan:   Palpitations No red flags on discussion Has had a litany of labwork over the past year with multiple providers -- I personally reviewed this Will order zio patch for further characterization of these palpitations She is also interested in an echocardiogram -- I will order this as well Further intervention based on results and symptoms -- encouraged her to let me know if new/worsening sx develop in the meantime  Abnormal ultrasound of kidney Will obtain BMP, urinalysis and CT stone protocol for further evaluation Intervention based on results  LUQ pain Ongoing Will update CT stone and labs/urine as described above Continue to monitor   Time spent with patient today was 50 minutes which consisted  of chart review of multiple providers, discussing diagnosis, work up, treatment answering questions and documentation.  Inda Coke, PA-C

## 2021-03-18 ENCOUNTER — Encounter: Payer: Self-pay | Admitting: Physician Assistant

## 2021-03-18 ENCOUNTER — Ambulatory Visit (HOSPITAL_COMMUNITY)
Admission: RE | Admit: 2021-03-18 | Discharge: 2021-03-18 | Disposition: A | Discharge status: Home / Self Care | Payer: Managed Care, Other (non HMO) | Source: Ambulatory Visit | Attending: Physician Assistant | Admitting: Physician Assistant

## 2021-03-18 DIAGNOSIS — R935 Abnormal findings on diagnostic imaging of other abdominal regions, including retroperitoneum: Secondary | ICD-10-CM

## 2021-03-19 ENCOUNTER — Ambulatory Visit (HOSPITAL_COMMUNITY)
Admission: RE | Admit: 2021-03-19 | Discharge: 2021-03-19 | Disposition: A | Discharge status: Home / Self Care | Payer: Managed Care, Other (non HMO) | Source: Ambulatory Visit | Attending: Physician Assistant | Admitting: Physician Assistant

## 2021-03-19 ENCOUNTER — Other Ambulatory Visit: Payer: Self-pay | Admitting: Physician Assistant

## 2021-03-19 ENCOUNTER — Encounter: Payer: Self-pay | Admitting: Physician Assistant

## 2021-03-19 ENCOUNTER — Other Ambulatory Visit: Payer: Self-pay

## 2021-03-19 DIAGNOSIS — R1900 Intra-abdominal and pelvic swelling, mass and lump, unspecified site: Secondary | ICD-10-CM | POA: Insufficient documentation

## 2021-03-19 MED ORDER — GADOBUTROL 1 MMOL/ML IV SOLN
6.0000 mL | Freq: Once | INTRAVENOUS | Status: AC | PRN
  Administered 2021-03-19: 6 mL via INTRAVENOUS

## 2021-03-20 ENCOUNTER — Encounter: Payer: Self-pay | Admitting: Physician Assistant

## 2021-03-21 DIAGNOSIS — R002 Palpitations: Secondary | ICD-10-CM | POA: Diagnosis not present

## 2021-03-22 ENCOUNTER — Encounter: Payer: Self-pay | Admitting: Physician Assistant

## 2021-03-30 ENCOUNTER — Encounter: Payer: Self-pay | Admitting: Physician Assistant

## 2021-03-31 ENCOUNTER — Ambulatory Visit (HOSPITAL_COMMUNITY)
Admission: RE | Admit: 2021-03-31 | Discharge: 2021-03-31 | Disposition: A | Discharge status: Home / Self Care | Payer: Managed Care, Other (non HMO) | Source: Ambulatory Visit | Attending: Physician Assistant | Admitting: Physician Assistant

## 2021-03-31 ENCOUNTER — Other Ambulatory Visit: Payer: Self-pay | Admitting: Physician Assistant

## 2021-03-31 DIAGNOSIS — R399 Unspecified symptoms and signs involving the genitourinary system: Secondary | ICD-10-CM

## 2021-03-31 DIAGNOSIS — R0789 Other chest pain: Secondary | ICD-10-CM

## 2021-03-31 DIAGNOSIS — R002 Palpitations: Secondary | ICD-10-CM | POA: Diagnosis present

## 2021-03-31 DIAGNOSIS — R008 Other abnormalities of heart beat: Secondary | ICD-10-CM | POA: Insufficient documentation

## 2021-03-31 DIAGNOSIS — R93429 Abnormal radiologic findings on diagnostic imaging of unspecified kidney: Secondary | ICD-10-CM

## 2021-03-31 LAB — ECHOCARDIOGRAM COMPLETE
Area-P 1/2: 3.99 cm2
S' Lateral: 3.1 cm

## 2021-04-01 ENCOUNTER — Other Ambulatory Visit (INDEPENDENT_AMBULATORY_CARE_PROVIDER_SITE_OTHER): Payer: Managed Care, Other (non HMO)

## 2021-04-01 ENCOUNTER — Other Ambulatory Visit: Payer: Self-pay

## 2021-04-01 DIAGNOSIS — R3129 Other microscopic hematuria: Secondary | ICD-10-CM

## 2021-04-01 LAB — URINALYSIS, ROUTINE W REFLEX MICROSCOPIC
Bilirubin Urine: NEGATIVE
Ketones, ur: NEGATIVE
Leukocytes,Ua: NEGATIVE
Nitrite: NEGATIVE
Specific Gravity, Urine: 1.025 (ref 1.000–1.030)
Total Protein, Urine: NEGATIVE
Urine Glucose: NEGATIVE
Urobilinogen, UA: 0.2 (ref 0.0–1.0)
pH: 6 (ref 5.0–8.0)

## 2021-04-05 ENCOUNTER — Encounter: Payer: Self-pay | Admitting: Physician Assistant

## 2021-04-05 ENCOUNTER — Encounter: Payer: Self-pay | Admitting: Internal Medicine

## 2021-04-05 NOTE — Progress Notes (Deleted)
Office Visit Note  [name]             Date of Birth: 04-27-1980           MRN: [id number]             PCP: Inda Coke, PA Referring: Inda Coke, PA Visit Date: 04/06/2021   Subjective:  No chief complaint on file.   History of Present Illness: Alicia Carr is a 41 y.o. female here for follow up for raynaud's after starting amlodipine and multiple ongoing generalized symptoms. She developed some heart palpitations and on zio monitor noted tachycardia to 170s.***   Previous HPI 02/04/21 Alicia Carr is a 41 y.o. female here for follow up with abnormal labs and multiple generalized symptoms.  Since last visit she is noticing intermittent joint pains in multiple sites not associated with significant swelling most recently in the elbows. She has continued episodes of tingling or numbness in the fingertips and toes.  Episodes of muscle twitching that come and go and she spent 1 episode with 3 days of low back pain and stiffness unable to fully extend.  She saw vascular surgery for symptoms including livedo reticularis exam suggested findings typical for Raynaud's no other major problem identified.   Previous HPI 10/06/20 Alicia Carr is a 41 y.o. female here with joint pains and numbness affecting bilateral fingers and toes that is worse especially in the past few months.  She developed shingles outbreak on the face and left eye in May this is treated and symptoms have improved besides some residual hyperpigmentation.  However also slightly earlier this year was recently diagnosed with [place] by biopsy during evaluation of abdominal symptoms and abnormal liver function tests.  She reports morning stiffness difficulty gripping her hands tightly or performing tasks first thing in the morning this improves some time and is normal by the afternoon.  She notices occasional swelling or puffiness especially in the feet but not all the time.  She denies  discoloration such as pallor, erythema, or cyanosis.  She tried taking oral diclofenac for joint pains but developed an episode of painless hematochezia so stopped this medication. She denies any lymphadenopathy, photosensitive rashes, or history of blood clots.   No Rheumatology ROS completed.   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 cause 06/18/2020    Past Medical History:  Diagnosis Date   Alcoholism ([place])    Anemia    Gallstones    History of chicken pox    Primary sclerosing cholangitis    Shingles    Shingles 07/18/2020   Vaginal delivery 2003    Family History  Problem Relation Age of Onset   Mental illness Mother        paranoid schizophrenia   Hyperlipidemia Mother    Alcohol abuse Father    Colon polyps Father    Hypertension Father    Heart murmur Brother    Cancer Maternal Grandmother        uncertain type   Healthy Daughter    Asthma Daughter    Learning disabilities Daughter    Healthy Son    Diabetes Paternal Aunt    Past Surgical History:  Procedure Laterality Date   CESAREAN SECTION  08/08/2006   CHOLECYSTECTOMY  2003   Social History   Social History Narrative   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, [name]   1 son born 2003 1 daughter born 2008   Alcohol use regular for 10+ years stopped 01/18/2020 was 2 to 3 glasses of wine daily   No drug use   Coffee and tea 2 or 3 a day for caffeine   Immunization History  Administered Date(s) Administered   Hep A / Hep B 09/08/2020, 10/12/2020   Hepatitis A, Adult 03/12/2021   Hepatitis B, ped/adol 03/12/2021, 03/12/2021   Influenza,inj,Quad PF,6+ Mos 12/26/2018, 11/29/2019, 12/03/2020   Influenza,inj,quad, With Preservative  11/29/2019   PFIZER(Purple Top)SARS-COV-2 Vaccination 05/02/2019, 05/23/2019, 01/03/2020, 10/06/2020   Tdap 05/10/2006, 11/29/2019     Objective: Vital Signs: There were no vitals taken for this visit.   Physical Exam   Musculoskeletal Exam: ***  CDAI Exam: CDAI Score: -- Patient Global: --; Provider Global: -- Swollen: --; Tender: -- Joint Exam 04/06/2021   No joint exam has been documented for this visit   There is currently no information documented on the homunculus. Go to the Rheumatology activity and complete the homunculus joint exam.  Investigation: No additional findings.  Imaging: MR Pelvis W Wo Contrast  Addendum Date: 03/29/2021   ADDENDUM REPORT: 03/29/2021 14:34 ADDENDUM: The uterus is at the upper limit of normal in size, perhaps with some T2 hyperintensity of the myometrium immediately underlying the endometrium. This appearance generally suggests, but is not definitive for mild adenomyosis. There is no abnormal contrast enhancement or evidence of suspicious mass. Electronically Signed   By: Delanna Ahmadi M.D.   On: 03/29/2021 14:34   Result Date: 03/29/2021 CLINICAL DATA:  Right ovarian mass incidentally identified by prior ultrasound EXAM: MRI PELVIS WITHOUT AND WITH CONTRAST TECHNIQUE: Multiplanar multisequence MR imaging of the pelvis was performed both before and after administration of intravenous contrast. CONTRAST:  22mL GADAVIST GADOBUTROL 1 MMOL/ML IV SOLN COMPARISON:  Pelvic ultrasound, 03/18/2021 FINDINGS: Urinary Tract:  No abnormality visualized. Bowel:  Unremarkable visualized pelvic bowel loops. Vascular/Lymphatic: No pathologically enlarged lymph nodes. No significant vascular abnormality seen. 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 The ovaries are otherwise normal with numerous small functional follicles. Nabothian cysts of the cervix. Other:  None. Musculoskeletal: No  suspicious bone lesions identified. IMPRESSION: 1. 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 No further follow-up or characterization is required for this benign lesion. 2. The ovaries are otherwise normal with numerous small functional follicles. Electronically Signed: By: Delanna Ahmadi M.D. On: 03/19/2021 19:49   US Abdomen Complete  Result Date: 03/12/2021 CLINICAL DATA:  Left upper quadrant pain. History of prior cholecystectomy. EXAM: ABDOMEN ULTRASOUND COMPLETE COMPARISON:  March 26, 2020 FINDINGS: Gallbladder: The gallbladder is surgically absent. Common bile duct: Diameter: 7.1 mm Liver: The liver parenchyma is nodular in echotexture and increased in echogenicity. Areas of focal fatty sparing are seen. Portal vein is patent on color Doppler imaging with normal direction of blood flow towards the liver. IVC: No abnormality visualized. Pancreas: Visualized portion unremarkable. Spleen: Size (5.21 cm) and appearance within normal limits. Right Kidney: Length: 10.01 cm. Echogenicity within normal limits. No mass or hydronephrosis visualized. Left Kidney: Length: 10.43 cm. Echogenicity within normal limits. A 5.2 mm shadowing echogenic focus is seen within the mid left kidney. No  mass or hydronephrosis visualized. Abdominal aorta: No aneurysm visualized. Other findings: None. IMPRESSION: 1. Findings suspicious for a subcentimeter non-obstructing left renal calculus. Further evaluation with renal protocol abdomen pelvis CT is recommended. 2. Hepatic steatosis and suspected cirrhotic changes, with focal fatty sparing. Electronically Signed   By: Virgina Norfolk M.D.   On: 03/12/2021 20:11   ECHOCARDIOGRAM COMPLETE  Result Date: 03/31/2021    ECHOCARDIOGRAM REPORT   Patient Name:   Alicia Carr Date of Exam: 03/31/2021 Medical Rec #:  [id number]         Height:       60.7 in Accession #:    [id number]        Weight:        128.0 lb Date of Birth:  12-15-1980         BSA:          1.558 m Patient Age:    32 years          BP:           110/80 mmHg Patient Gender: F                 HR:           86 bpm. Exam Location:  Outpatient Procedure: 2D Echo, 3D Echo, Color Doppler and Cardiac Doppler Indications:    Palpitation R00.2  History:        Patient has no prior history of Echocardiogram examinations.  Sonographer:    Mikki Santee RDCS Referring Phys: [id number] [place]  1. Left ventricular ejection fraction, by estimation, is 55 to 60%. The left ventricle has normal function. The left ventricle has no regional wall motion abnormalities. Left ventricular diastolic parameters were normal.  2. Right ventricular systolic function is normal. The right ventricular size is normal. Tricuspid regurgitation signal is inadequate for assessing PA pressure.  3. The mitral valve is grossly normal. Trivial mitral valve regurgitation. No evidence of mitral stenosis.  4. The aortic valve is grossly normal. Aortic valve regurgitation is not visualized. No aortic stenosis is present.  5. The inferior vena cava is normal in size with greater than 50% respiratory variability, suggesting right atrial pressure of 3 mmHg. Conclusion(s)/Recommendation(s): Normal biventricular function without evidence of hemodynamically significant valvular heart disease. FINDINGS  Left Ventricle: Left ventricular ejection fraction, by estimation, is 55 to 60%. The left ventricle has normal function. The left ventricle has no regional wall motion abnormalities. The left ventricular internal cavity size was normal in size. There is  no left ventricular hypertrophy. Left ventricular diastolic parameters were normal. Right Ventricle: The right ventricular size is normal. No increase in right ventricular wall thickness. Right ventricular systolic function is normal. Tricuspid regurgitation signal is inadequate for assessing PA pressure. Left Atrium: Left  atrial size was normal in size. Right Atrium: Right atrial size was normal in size. Pericardium: There is no evidence of pericardial effusion. Mitral Valve: The mitral valve is grossly normal. Trivial mitral valve regurgitation. No evidence of mitral valve stenosis. Tricuspid Valve: The tricuspid valve is grossly normal. Tricuspid valve regurgitation is trivial. No evidence of tricuspid stenosis. Aortic Valve: The aortic valve is grossly normal. Aortic valve regurgitation is not visualized. No aortic stenosis is present. Pulmonic Valve: The pulmonic valve was grossly normal. Pulmonic valve regurgitation is not visualized. No evidence of pulmonic stenosis. Aorta: The aortic root is normal in size and structure. Venous: The inferior vena cava is normal in size with greater than 50% respiratory  variability, suggesting right atrial pressure of 3 mmHg. IAS/Shunts: The atrial septum is grossly normal.  LEFT VENTRICLE PLAX 2D LVIDd:         4.50 cm   Diastology LVIDs:         3.10 cm   LV e' medial:    12.20 cm/s LV PW:         0.60 cm   LV E/e' medial:  6.4 LV IVS:        0.70 cm   LV e' lateral:   17.60 cm/s LVOT diam:     1.90 cm   LV E/e' lateral: 4.4 LV SV:         57 LV SV Index:   37 LVOT Area:     2.84 cm                           3D Volume EF:                          3D EF:        57 %                          LV EDV:       106 ml                          LV ESV:       45 ml                          LV SV:        60 ml RIGHT VENTRICLE RV S prime:     12.30 cm/s TAPSE (M-mode): 1.9 cm LEFT ATRIUM             Index        RIGHT ATRIUM          Index LA diam:        3.00 cm 1.93 cm/m   RA Area:     9.84 cm LA Vol (A2C):   38.6 ml 24.77 ml/m  RA Volume:   20.90 ml 13.41 ml/m LA Vol (A4C):   31.6 ml 20.28 ml/m LA Biplane Vol: 36.6 ml 23.49 ml/m  AORTIC VALVE LVOT Vmax:   110.00 cm/s LVOT Vmean:  71.800 cm/s LVOT VTI:    0.202 m  AORTA Ao Root diam: 2.90 cm MITRAL VALVE MV Area (PHT): 3.99 cm    SHUNTS MV Decel  Time: 190 msec    Systemic VTI:  0.20 m MV E velocity: 78.10 cm/s  Systemic Diam: 1.90 cm MV A velocity: 52.90 cm/s MV E/A ratio:  1.48 Eleonore Chiquito MD Electronically signed by Eleonore Chiquito MD Signature Date/Time: 03/31/2021/3:07:53 PM    Final    CT RENAL STONE STUDY  Result Date: 03/17/2021 CLINICAL DATA:  Nephrolithiasis, follow-up ultrasound abdomen EXAM: CT ABDOMEN AND PELVIS WITHOUT CONTRAST TECHNIQUE: Multidetector CT imaging of the abdomen and pelvis was performed following the standard protocol without IV contrast. RADIATION DOSE REDUCTION: This exam was performed according to the departmental dose-optimization program which includes automated exposure control, adjustment of the mA and/or kV according to patient size and/or use of iterative reconstruction technique. COMPARISON:  03/12/2021 ultrasound FINDINGS: Lower chest: No acute abnormality. Hepatobiliary: No focal liver abnormality seen. The liver is not steatotic. Gallbladder is  absent. No biliary dilatation. Pancreas: Unremarkable. Spleen: Unremarkable. Adrenals/Urinary Tract: Adrenals are unremarkable. No renal calculi. No renal lesion to account for hyperechoic focus. Bladder is unremarkable. Stomach/Bowel: Stomach is within normal limits. Bowel is normal in caliber. Stool throughout the colon. Normal appendix. Vascular/Lymphatic: No significant vascular abnormality on this noncontrast study. Reproductive: Enlarged uterus. A 1.4 cm fat containing pelvic lesion may be adnexal (right). Other: No free fluid.  Abdominal wall is unremarkable. Musculoskeletal: No acute osseous abnormality. IMPRESSION: No stone or fat containing lesion corresponding to the left renal hyperechoic focus on ultrasound. Hepatic steatosis is not evident on this study. Liver is also not hyperechoic on ultrasound and therefore steatosis is unlikely. Enlarged uterus. 1.4 cm fat containing right pelvic lesion may be adnexal and represent a dermoid. These findings could be  evaluated by pelvic ultrasound. Electronically Signed   By: Macy Mis M.D.   On: 03/17/2021 12:22   US Pelvic Complete With Transvaginal  Result Date: 03/18/2021 CLINICAL DATA:  Enlarged uterus EXAM: TRANSABDOMINAL AND TRANSVAGINAL ULTRASOUND OF PELVIS TECHNIQUE: Both transabdominal and transvaginal ultrasound examinations of the pelvis were performed. Transabdominal technique was performed for global imaging of the pelvis including uterus, ovaries, adnexal regions, and pelvic cul-de-sac. It was necessary to proceed with endovaginal exam following the transabdominal exam to visualize the uterus. COMPARISON:  CT abdomen and pelvis dated March 17, 2021 FINDINGS: Uterus Measurements: 12.4 x 4.5 x 5.5 cm = volume: 162 mL. Anteverted. No fibroids or other mass visualized. Nabothian cysts incidentally noted Endometrium Thickness: 10.  No focal abnormality visualized. Right ovary Measurements: 3.1 x 2.1 x 3.9 cm = volume: 12.9 mL. Echogenic right adnexal mass measuring 1.7 x 1.0 x 0.3 cm with questionable areas of internal vascularity. Left ovary Measurements: 2.5 x 1.4 x 2.3 cm = volume: 4.1 mL. Normal appearance/no adnexal mass. Simple appearing left para ovarian cyst measuring 0.7 x 0.7 x 067 cm. Other findings No abnormal free fluid. IMPRESSION: 1. Uterus is enlarged heterogeneous. No fibroid or other mass visualized. 2. Echogenic lesion mass of the right ovary measuring up to 1.7 cm, likely a dermoid cyst, although there is questionable internal vascularity. Recommend pelvic MRI for further evaluation. Electronically Signed   By: Yetta Glassman M.D.   On: 03/18/2021 15:55   LONG TERM MONITOR (3-14 DAYS)  Result Date: 04/05/2021 Patch Wear Time:  6 days and 2 hours (2023-01-22T17:03:34-0500 to 2023-01-28T19:32:11-498) Sinus rhythm. (min HR of 61 bpm, max HR of 176 bpm, and avg HR of 93 bpm. Isolated Premature atrial contractions were rare (<1.0%). Isolated Premature ventricular contractions were rare  (<1.0%),    Recent Labs: Lab Results  Component Value Date   WBC 5.2 12/03/2020   HGB 11.7 (L) 12/03/2020   PLT 242.0 12/03/2020   NA 138 03/17/2021   K 4.0 03/17/2021   CL 104 03/17/2021   CO2 26 03/17/2021   GLUCOSE 81 03/17/2021   BUN 17 03/17/2021   CREATININE 0.77 03/17/2021   BILITOT 0.4 12/03/2020   ALKPHOS 100 12/03/2020   AST 26 12/03/2020   ALT 22 12/03/2020   PROT 7.2 12/03/2020   ALBUMIN 4.3 12/03/2020   CALCIUM 9.2 03/17/2021   GFRAA 95 08/24/2016   QFTBGOLDPLUS NEGATIVE 07/23/2020    Speciality Comments: No specialty comments available.  Procedures:  No procedures performed Allergies: Patient has no known allergies.   Assessment / Plan:     Visit Diagnoses: No diagnosis found.  ***  Orders: No orders of the defined types were placed in this  encounter.  No orders of the defined types were placed in this encounter.    Follow-Up Instructions: No follow-ups on file.   Collier Salina, MD  Note - This record has been created using [employer].  Chart creation errors have been sought, but may not always  have been located. Such creation errors do not reflect on  the standard of medical care.

## 2021-04-06 ENCOUNTER — Ambulatory Visit: Payer: Managed Care, Other (non HMO) | Admitting: Internal Medicine

## 2021-04-06 NOTE — Progress Notes (Signed)
Office Visit Note  [name]             Date of Birth: [birth year]           MRN: [id number]             PCP: Alicia Coke, PA Referring: Alicia Coke, PA Visit Date: 04/07/2021   Subjective:  Follow-up (Flare, bil hand pain and swelling, rash, extremely cold)   History of Present Illness: Alicia Carr is a 41 y.o. female here for follow up with numerous different symptoms ongoing some in exacerbation.  So far addition of the amlodipine has not significantly helped her finger or toe discoloration is actually having more blue and violaceous changes than before in the fingers.  She feels extremely cold much of the time somewhat regardless of the ambient temperature.  Chronic persistent fatigue remains her most significant issue.  She also continues having muscle twitching or inability to fully close or extend for periods of time the seem to come and go without trigger.  She has some hand swelling no but no increase in leg swelling.  She was feeling some palpitations and wore a heart monitor apparently this showed some episodes of moderate tachycardia but without concerning arrhythmias seen.  Previous HPI 02/04/21 Alicia Carr is a 41 y.o. female here for follow up with abnormal labs and multiple generalized symptoms.  Since last visit she is noticing intermittent joint pains in multiple sites not associated with significant swelling most recently in the elbows.  She has continued episodes of tingling or numbness in the fingertips and toes.  Episodes of muscle twitching that come and go and she spent 1 episode with 3 days of low back pain and stiffness unable to fully extend.  She saw vascular surgery for symptoms including livedo reticularis exam suggested findings typical for Raynaud's no other major problem identified.  Previous HPI 10/06/20 Alicia Carr is a 41 y.o. female here with joint pains and numbness affecting bilateral fingers and toes that is  worse especially in the past few months.  She developed shingles outbreak on the face and left eye in May this is treated and symptoms have improved besides some residual hyperpigmentation.  However also slightly earlier this year was recently diagnosed with [place] by biopsy during evaluation of abdominal symptoms and abnormal liver function tests.  She reports morning stiffness difficulty gripping her hands tightly or performing tasks first thing in the morning this improves some time and is normal by the afternoon.  She notices occasional swelling or puffiness especially in the feet but not all the time.  She denies discoloration such as pallor, erythema, or cyanosis.  She tried taking oral diclofenac for joint pains but developed an episode of painless hematochezia so stopped this medication. She denies any lymphadenopathy, photosensitive rashes, or history of blood clots.  Review of Systems  Constitutional:  Positive for fatigue.  HENT:  Negative for mouth dryness.   Eyes:  Negative for dryness.  Respiratory:  Negative for shortness of breath.   Cardiovascular:  Negative for swelling in legs/feet.  Gastrointestinal:  Negative for constipation.  Endocrine: Positive for cold intolerance and increased urination.  Genitourinary:  Negative for difficulty urinating.  Musculoskeletal:  Positive for joint pain, joint pain, joint swelling and morning stiffness.  Skin:  Positive for rash.  Allergic/Immunologic: Negative for susceptible to infections.  Neurological:  Positive for numbness.  Hematological:  Positive for bruising/bleeding tendency.  Psychiatric/Behavioral:  Positive for sleep disturbance.  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 cause 06/18/2020     Past Medical History:  Diagnosis Date   Alcoholism ([place])    Anemia    Gallstones    History of chicken pox    Primary sclerosing cholangitis    Shingles    Shingles 07/18/2020   Vaginal delivery 2003    Family History  Problem Relation Age of Onset   Mental illness Mother        paranoid schizophrenia   Hyperlipidemia Mother    Alcohol abuse Father    Colon polyps Father    Hypertension Father    Heart murmur Brother    Cancer Maternal Grandmother        uncertain type   Healthy Daughter    Asthma Daughter    Learning disabilities Daughter    Healthy Son    Diabetes Paternal Aunt    Past Surgical History:  Procedure Laterality Date   CESAREAN SECTION  08/08/2006   CHOLECYSTECTOMY  2003   Social History   Social History Narrative   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, [name]   1 son born 2003 1 daughter born 2008   Alcohol use regular for 10+ years stopped 01/18/2020 was 2 to 3 glasses of wine daily   No drug use   Coffee and tea 2 or 3 a day for caffeine   Immunization History  Administered Date(s) Administered   Hep A / Hep B 09/08/2020, 10/12/2020   Hepatitis A, Adult 03/12/2021   Hepatitis B, ped/adol 03/12/2021, 03/12/2021   Influenza,inj,Quad PF,6+ Mos 12/26/2018, 11/29/2019, 12/03/2020   Influenza,inj,quad, With Preservative 11/29/2019   PFIZER(Purple Top)SARS-COV-2 Vaccination 05/02/2019, 05/23/2019, 01/03/2020, 10/06/2020   Tdap 05/10/2006, 11/29/2019     Objective: Vital Signs: BP 107/70 (BP Location: Left Arm, Patient Position: Sitting, Cuff Size: Normal)    Pulse 93    Resp 15    Ht 5' 0.75" (1.543 m)    Wt 129 lb (58.5 kg)    BMI 24.58 kg/m    Physical Exam Cardiovascular:     Rate and Rhythm: Normal rate and regular rhythm.  Pulmonary:     Effort: Pulmonary effort is normal.     Breath sounds: Normal breath sounds.  Musculoskeletal:     Right lower leg: No edema.     Left lower leg: No edema.  Skin:     General: Skin is dry.     Comments: Bilateral hands are mildly cool, dusky appearance of fingertips on both hands, no digital pitting ulcers or peeling Few telangiectasias on palmar surfaces  Neurological:     Mental Status: She is alert.     Musculoskeletal Exam:  Shoulders full ROM no tenderness or swelling Elbows full ROM no tenderness or swelling Wrists full ROM no tenderness or swelling Fingers full ROM no tenderness or swelling Knees full ROM no tenderness or swelling   Investigation: No additional findings.  Imaging: MR Pelvis W Wo Contrast  Addendum Date: 03/29/2021   ADDENDUM REPORT: 03/29/2021 14:34 ADDENDUM: The uterus is at the upper limit of normal in size, perhaps with some T2 hyperintensity of the myometrium immediately underlying the endometrium. This appearance generally suggests, but is not definitive for mild adenomyosis. There is no abnormal contrast enhancement  or evidence of suspicious mass. Electronically Signed   By: Delanna Ahmadi M.D.   On: 03/29/2021 14:34   Result Date: 03/29/2021 CLINICAL DATA:  Right ovarian mass incidentally identified by prior ultrasound EXAM: MRI PELVIS WITHOUT AND WITH CONTRAST TECHNIQUE: Multiplanar multisequence MR imaging of the pelvis was performed both before and after administration of intravenous contrast. CONTRAST:  35mL GADAVIST GADOBUTROL 1 MMOL/ML IV SOLN COMPARISON:  Pelvic ultrasound, 03/18/2021 FINDINGS: Urinary Tract:  No abnormality visualized. Bowel:  Unremarkable visualized pelvic bowel loops. Vascular/Lymphatic: No pathologically enlarged lymph nodes. No significant vascular abnormality seen. 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 The ovaries are otherwise normal with numerous small functional follicles. Nabothian cysts of the cervix. Other:  None. Musculoskeletal: No suspicious bone lesions identified. IMPRESSION: 1. 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 No further follow-up or characterization is required for this benign lesion. 2. The ovaries are otherwise normal with numerous small functional follicles. Electronically Signed: By: Delanna Ahmadi M.D. On: 03/19/2021 19:49   ECHOCARDIOGRAM COMPLETE  Result Date: 03/31/2021    ECHOCARDIOGRAM REPORT   Patient Name:   CHERYLL KEISLER Date of Exam: 03/31/2021 Medical Rec #:  [id number]         Height:       60.7 in Accession #:    [id number]        Weight:       128.0 lb Date of Birth:  1981/02/01         BSA:          1.558 m Patient Age:    29 years          BP:           110/80 mmHg Patient Gender: F                 HR:           86 bpm. Exam Location:  Outpatient Procedure: 2D Echo, 3D Echo, Color Doppler and Cardiac Doppler Indications:    Palpitation R00.2  History:        Patient has no prior history of Echocardiogram examinations.  Sonographer:    Mikki Santee RDCS Referring Phys: [id number] [place]  1. Left ventricular ejection fraction, by estimation, is 55 to 60%. The left ventricle has normal function. The left ventricle has no regional wall motion abnormalities. Left ventricular diastolic parameters were normal.  2. Right ventricular systolic function is normal. The right ventricular size is normal. Tricuspid regurgitation signal is inadequate for assessing PA pressure.  3. The mitral valve is grossly normal. Trivial mitral valve regurgitation. No evidence of mitral stenosis.  4. The aortic valve is grossly normal. Aortic valve regurgitation is not visualized. No aortic stenosis is present.  5. The inferior vena cava is normal in size with greater than 50% respiratory variability, suggesting right atrial pressure of 3 mmHg. Conclusion(s)/Recommendation(s): Normal biventricular function without evidence of hemodynamically significant valvular heart disease. FINDINGS  Left Ventricle:  Left ventricular ejection fraction, by estimation, is 55 to 60%. The left ventricle has normal function. The left ventricle has no regional wall motion abnormalities. The left ventricular internal cavity size was normal in size. There is  no left ventricular hypertrophy. Left ventricular diastolic parameters were normal. Right Ventricle: The right ventricular size is normal. No increase in right  ventricular wall thickness. Right ventricular systolic function is normal. Tricuspid regurgitation signal is inadequate for assessing PA pressure. Left Atrium: Left atrial size was normal in size. Right Atrium: Right atrial size was normal in size. Pericardium: There is no evidence of pericardial effusion. Mitral Valve: The mitral valve is grossly normal. Trivial mitral valve regurgitation. No evidence of mitral valve stenosis. Tricuspid Valve: The tricuspid valve is grossly normal. Tricuspid valve regurgitation is trivial. No evidence of tricuspid stenosis. Aortic Valve: The aortic valve is grossly normal. Aortic valve regurgitation is not visualized. No aortic stenosis is present. Pulmonic Valve: The pulmonic valve was grossly normal. Pulmonic valve regurgitation is not visualized. No evidence of pulmonic stenosis. Aorta: The aortic root is normal in size and structure. Venous: The inferior vena cava is normal in size with greater than 50% respiratory variability, suggesting right atrial pressure of 3 mmHg. IAS/Shunts: The atrial septum is grossly normal.  LEFT VENTRICLE PLAX 2D LVIDd:         4.50 cm   Diastology LVIDs:         3.10 cm   LV e' medial:    12.20 cm/s LV PW:         0.60 cm   LV E/e' medial:  6.4 LV IVS:        0.70 cm   LV e' lateral:   17.60 cm/s LVOT diam:     1.90 cm   LV E/e' lateral: 4.4 LV SV:         57 LV SV Index:   37 LVOT Area:     2.84 cm                           3D Volume EF:                          3D EF:        57 %                          LV EDV:       106 ml                          LV  ESV:       45 ml                          LV SV:        60 ml RIGHT VENTRICLE RV S prime:     12.30 cm/s TAPSE (M-mode): 1.9 cm LEFT ATRIUM             Index        RIGHT ATRIUM          Index LA diam:        3.00 cm 1.93 cm/m   RA Area:     9.84 cm LA Vol (A2C):   38.6 ml 24.77 ml/m  RA Volume:   20.90 ml 13.41 ml/m LA Vol (A4C):   31.6 ml 20.28 ml/m LA Biplane Vol: 36.6 ml 23.49 ml/m  AORTIC VALVE LVOT Vmax:   110.00 cm/s LVOT Vmean:  71.800 cm/s LVOT VTI:    0.202 m  AORTA Ao Root diam: 2.90 cm MITRAL VALVE MV Area (PHT): 3.99 cm    SHUNTS MV Decel Time: 190 msec  Systemic VTI:  0.20 m MV E velocity: 78.10 cm/s  Systemic Diam: 1.90 cm MV A velocity: 52.90 cm/s MV E/A ratio:  1.48 Eleonore Chiquito MD Electronically signed by Eleonore Chiquito MD Signature Date/Time: 03/31/2021/3:07:53 PM    Final    CT RENAL STONE STUDY  Result Date: 03/17/2021 CLINICAL DATA:  Nephrolithiasis, follow-up ultrasound abdomen EXAM: CT ABDOMEN AND PELVIS WITHOUT CONTRAST TECHNIQUE: Multidetector CT imaging of the abdomen and pelvis was performed following the standard protocol without IV contrast. RADIATION DOSE REDUCTION: This exam was performed according to the departmental dose-optimization program which includes automated exposure control, adjustment of the mA and/or kV according to patient size and/or use of iterative reconstruction technique. COMPARISON:  03/12/2021 ultrasound FINDINGS: Lower chest: No acute abnormality. Hepatobiliary: No focal liver abnormality seen. The liver is not steatotic. Gallbladder is absent. No biliary dilatation. Pancreas: Unremarkable. Spleen: Unremarkable. Adrenals/Urinary Tract: Adrenals are unremarkable. No renal calculi. No renal lesion to account for hyperechoic focus. Bladder is unremarkable. Stomach/Bowel: Stomach is within normal limits. Bowel is normal in caliber. Stool throughout the colon. Normal appendix. Vascular/Lymphatic: No significant vascular abnormality on this noncontrast  study. Reproductive: Enlarged uterus. A 1.4 cm fat containing pelvic lesion may be adnexal (right). Other: No free fluid.  Abdominal wall is unremarkable. Musculoskeletal: No acute osseous abnormality. IMPRESSION: No stone or fat containing lesion corresponding to the left renal hyperechoic focus on ultrasound. Hepatic steatosis is not evident on this study. Liver is also not hyperechoic on ultrasound and therefore steatosis is unlikely. Enlarged uterus. 1.4 cm fat containing right pelvic lesion may be adnexal and represent a dermoid. These findings could be evaluated by pelvic ultrasound. Electronically Signed   By: Macy Mis M.D.   On: 03/17/2021 12:22   US Pelvic Complete With Transvaginal  Result Date: 03/18/2021 CLINICAL DATA:  Enlarged uterus EXAM: TRANSABDOMINAL AND TRANSVAGINAL ULTRASOUND OF PELVIS TECHNIQUE: Both transabdominal and transvaginal ultrasound examinations of the pelvis were performed. Transabdominal technique was performed for global imaging of the pelvis including uterus, ovaries, adnexal regions, and pelvic cul-de-sac. It was necessary to proceed with endovaginal exam following the transabdominal exam to visualize the uterus. COMPARISON:  CT abdomen and pelvis dated March 17, 2021 FINDINGS: Uterus Measurements: 12.4 x 4.5 x 5.5 cm = volume: 162 mL. Anteverted. No fibroids or other mass visualized. Nabothian cysts incidentally noted Endometrium Thickness: 10.  No focal abnormality visualized. Right ovary Measurements: 3.1 x 2.1 x 3.9 cm = volume: 12.9 mL. Echogenic right adnexal mass measuring 1.7 x 1.0 x 0.3 cm with questionable areas of internal vascularity. Left ovary Measurements: 2.5 x 1.4 x 2.3 cm = volume: 4.1 mL. Normal appearance/no adnexal mass. Simple appearing left para ovarian cyst measuring 0.7 x 0.7 x 067 cm. Other findings No abnormal free fluid. IMPRESSION: 1. Uterus is enlarged heterogeneous. No fibroid or other mass visualized. 2. Echogenic lesion mass of the  right ovary measuring up to 1.7 cm, likely a dermoid cyst, although there is questionable internal vascularity. Recommend pelvic MRI for further evaluation. Electronically Signed   By: Yetta Glassman M.D.   On: 03/18/2021 15:55   LONG TERM MONITOR (3-14 DAYS)  Result Date: 04/05/2021 Patch Wear Time:  6 days and 2 hours (2023-01-22T17:03:34-0500 to 2023-01-28T19:32:11-498) Sinus rhythm. (min HR of 61 bpm, max HR of 176 bpm, and avg HR of 93 bpm. Isolated Premature atrial contractions were rare (<1.0%). Isolated Premature ventricular contractions were rare (<1.0%),    Recent Labs: Lab Results  Component Value Date   WBC  5.2 12/03/2020   HGB 11.7 (L) 12/03/2020   PLT 242.0 12/03/2020   NA 138 03/17/2021   K 4.0 03/17/2021   CL 104 03/17/2021   CO2 26 03/17/2021   GLUCOSE 81 03/17/2021   BUN 17 03/17/2021   CREATININE 0.77 03/17/2021   BILITOT 0.4 12/03/2020   ALKPHOS 100 12/03/2020   AST 26 12/03/2020   ALT 22 12/03/2020   PROT 7.2 12/03/2020   ALBUMIN 4.3 12/03/2020   CALCIUM 9.2 03/17/2021   GFRAA 95 08/24/2016   QFTBGOLDPLUS NEGATIVE 07/23/2020    Speciality Comments: No specialty comments available.  Procedures:  No procedures performed Allergies: Patient has no known allergies.   Assessment / Plan:     Visit Diagnoses: Raynaud's syndrome without gangrene - Plan: sildenafil (REVATIO) 20 MG tablet  No improvement to amlodipine I do not know if hand swelling could be medication related but unlikely since this started quite a while after medication.  Plan to switch to sildenafil 20 mg daily see if this gives more benefit for her circulation.  Primary sclerosing cholangitis  Labs and liver findings stable based on hepatology clinic follow-up last month.  Positive ANA (antinuclear antibody)  Nonspecific clinical criteria or serologies I recommended there is usually not much value in repeating these very quickly.  Besides her Raynaud's symptoms no particular exam findings  of concern.  Fatigue is her other worse complaints but cause not very clear.  Orders: No orders of the defined types were placed in this encounter.  Meds ordered this encounter  Medications   sildenafil (REVATIO) 20 MG tablet    Sig: Take 1 tablet (20 mg total) by mouth daily.    Dispense:  30 tablet    Refill:  1     Follow-Up Instructions: Return in about 4 weeks (around 05/05/2021) for Raynauds/?CTD sildenafil f/u 4wks.   Collier Salina, MD  Note - This record has been created using [employer].  Chart creation errors have been sought, but may not always  have been located. Such creation errors do not reflect on  the standard of medical care.

## 2021-04-07 ENCOUNTER — Other Ambulatory Visit: Payer: Self-pay

## 2021-04-07 ENCOUNTER — Encounter: Payer: Self-pay | Admitting: Internal Medicine

## 2021-04-07 ENCOUNTER — Ambulatory Visit: Payer: Managed Care, Other (non HMO) | Admitting: Internal Medicine

## 2021-04-07 VITALS — BP 107/70 | HR 93 | Resp 15 | Ht 60.75 in | Wt 129.0 lb

## 2021-04-07 DIAGNOSIS — I7300 Raynaud's syndrome without gangrene: Secondary | ICD-10-CM

## 2021-04-07 DIAGNOSIS — K8301 Primary sclerosing cholangitis: Secondary | ICD-10-CM

## 2021-04-07 DIAGNOSIS — R768 Other specified abnormal immunological findings in serum: Secondary | ICD-10-CM | POA: Diagnosis not present

## 2021-04-07 MED ORDER — SILDENAFIL CITRATE 20 MG PO TABS: 20.0000 mg | ORAL_TABLET | Freq: Every day | ORAL | 1 refills | Status: DC

## 2021-04-12 ENCOUNTER — Telehealth: Payer: Self-pay | Admitting: Internal Medicine

## 2021-04-12 ENCOUNTER — Encounter: Payer: Self-pay | Admitting: Internal Medicine

## 2021-04-12 ENCOUNTER — Other Ambulatory Visit (HOSPITAL_COMMUNITY): Payer: Self-pay

## 2021-04-12 ENCOUNTER — Telehealth: Payer: Self-pay

## 2021-04-12 NOTE — Telephone Encounter (Signed)
Patient called the office stating that at her appointment on 2/8 Dr. Benjamine Mola prescribed Revatio for her and he mentioned that her insurance may not approve it. Patient states her pharmacy said that a PA is required for this medication. They told her to contact us to get that started.

## 2021-04-12 NOTE — Telephone Encounter (Signed)
Patient Advocate Encounter   Received notification from patient calls that prior authorization for Revatio 20mg  tabs is required by his/her insurance [employer].   PA submitted on 04/12/21  Key#: [id number]  Status is pending    [facility] will continue to follow:  Patient Advocate Fax: [phone]

## 2021-04-12 NOTE — Telephone Encounter (Signed)
Routing for f/u.

## 2021-04-22 ENCOUNTER — Other Ambulatory Visit (HOSPITAL_COMMUNITY): Payer: Self-pay

## 2021-04-22 NOTE — Telephone Encounter (Signed)
Called [employer] [phone] to follow up on PA.  No decision has been made yet.

## 2021-04-28 ENCOUNTER — Other Ambulatory Visit (HOSPITAL_COMMUNITY): Payer: Self-pay

## 2021-04-28 NOTE — Telephone Encounter (Signed)
Received notification from [facility] [employer] regarding a prior authorization for SILDENAFIL 20MG . Authorization has been APPROVED from 2.28.23 to 2.28.24.  ? ?Per test claim, copay for 30 days supply is $100 ? ? ? ?Authorization # REQUEST ID: [id number] ? ? ?

## 2021-05-02 NOTE — Progress Notes (Signed)
Office Visit Note  [name]             Date of Birth: Feb 19, 1981           MRN: [id number]             PCP: Inda Coke, PA Referring: Inda Coke, PA Visit Date: 05/03/2021   Subjective:  Follow-up (Medication is not working)   History of Present Illness: Alicia Carr is a 41 y.o. female here for follow up for raynaud's symptoms after starting sildenafil 20 mg. She continued noticing finger discoloration and rashes. She is also concerned with noticing some hyperpigmentation on the palms of her hands and also darkening of her gums. Still having some twitching and aches and overall concerned she is not feeling well. She had a mild amount of leg swelling intermittently. She is concerned about if her symptoms could be an adrenal or hormonal issue with ongoing skin changes and generalized symptoms.  Previous HPI 04/07/21 Alicia Carr is a 41 y.o. female here for follow up with numerous different symptoms ongoing some in exacerbation.  So far addition of the amlodipine has not significantly helped her finger or toe discoloration is actually having more blue and violaceous changes than before in the fingers.  She feels extremely cold much of the time somewhat regardless of the ambient temperature.  Chronic persistent fatigue remains her most significant issue.  She also continues having muscle twitching or inability to fully close or extend for periods of time the seem to come and go without trigger.  She has some hand swelling no but no increase in leg swelling.  She was feeling some palpitations and wore a heart monitor apparently this showed some episodes of moderate tachycardia but without concerning arrhythmias seen.   Previous HPI 10/06/20 Alicia Carr is a 41 y.o. female here with joint pains and numbness affecting bilateral fingers and toes that is worse especially in the past few months.  She developed shingles outbreak on the face and left eye in  May this is treated and symptoms have improved besides some residual hyperpigmentation.  However also slightly earlier this year was recently diagnosed with [place] by biopsy during evaluation of abdominal symptoms and abnormal liver function tests.  She reports morning stiffness difficulty gripping her hands tightly or performing tasks first thing in the morning this improves some time and is normal by the afternoon.  She notices occasional swelling or puffiness especially in the feet but not all the time.  She denies discoloration such as pallor, erythema, or cyanosis.  She tried taking oral diclofenac for joint pains but developed an episode of painless hematochezia so stopped this medication. She denies any lymphadenopathy, photosensitive rashes, or history of blood clots.   Review of Systems  Constitutional:  Positive for fatigue.  HENT:  Negative for mouth dryness.   Eyes:  Negative for dryness.  Respiratory:  Negative for shortness of breath.   Cardiovascular:  Negative for swelling in legs/feet.  Gastrointestinal:  Positive for constipation.  Endocrine: Positive for cold intolerance, excessive thirst and increased urination.  Genitourinary:  Positive for difficulty urinating.  Musculoskeletal:  Positive for joint pain, joint pain and morning stiffness.  Skin:  Negative for rash.  Allergic/Immunologic: Positive for susceptible to infections.  Neurological:  Positive for numbness and weakness.  Hematological:  Positive for bruising/bleeding tendency.  Psychiatric/Behavioral:  Positive for sleep disturbance.    PMFS History:  Patient Active Problem List   Diagnosis Date  Noted   Hyperpigmentation of skin 05/03/2021   Adrenal insufficiency ([place]) 05/03/2021   Raynaud's syndrome without gangrene 02/04/2021   Positive ANA (antinuclear antibody) 10/06/2020   Arthralgia 10/06/2020   Primary sclerosing cholangitis 09/04/2020   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 cause 06/18/2020     Past Medical History:  Diagnosis Date   Alcoholism ([place])    Anemia    Gallstones    History of chicken pox    Primary sclerosing cholangitis    Shingles    Shingles 07/18/2020   Vaginal delivery 2003    Family History  Problem Relation Age of Onset   Mental illness Mother        paranoid schizophrenia   Hyperlipidemia Mother    Alcohol abuse Father    Colon polyps Father    Hypertension Father    Heart murmur Brother    Cancer Maternal Grandmother        uncertain type   Healthy Daughter    Asthma Daughter    Learning disabilities Daughter    Healthy Son    Diabetes Paternal Aunt    Past Surgical History:  Procedure Laterality Date   CESAREAN SECTION  08/08/2006   CHOLECYSTECTOMY  2003   Social History   Social History Narrative   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, [name]   1 son born 2003 1 daughter born 2008   Alcohol use regular for 10+ years stopped 01/18/2020 was 2 to 3 glasses of wine daily   No drug use   Coffee and tea 2 or 3 a day for caffeine   Immunization History  Administered Date(s) Administered   Hep A / Hep B 09/08/2020, 10/12/2020   Hepatitis A, Adult 03/12/2021   Hepatitis B, ped/adol 03/12/2021, 03/12/2021   Influenza,inj,Quad PF,6+ Mos 12/26/2018, 11/29/2019, 12/03/2020   Influenza,inj,quad, With Preservative 11/29/2019   PFIZER(Purple Top)SARS-COV-2 Vaccination 05/02/2019, 05/23/2019, 01/03/2020, 10/06/2020   Tdap 05/10/2006, 11/29/2019     Objective: Vital Signs: BP 109/70 (BP Location: Left Arm, Patient Position: Sitting, Cuff Size: Normal)    Pulse 85    Resp 14    Ht 5' 3.25" (1.607 m)    Wt 131 lb (59.4 kg)    BMI 23.02 kg/m    Physical Exam HENT:     Mouth/Throat:     Comments: Dark discoloration on upper and lower gingival surfaces, no visible ulcers or swelling Probable telangiectasia on lip border Cardiovascular:     Rate and Rhythm: Normal rate and regular rhythm.  Pulmonary:     Effort:  Pulmonary effort is normal.     Breath sounds: Normal breath sounds.  Musculoskeletal:     Left lower leg: No edema.  Skin:    Comments: Few telangiectasias on fingers and palms, nailfold capillary changes, patches of hypopigmented skin on upper arm  Neurological:     General: No focal deficit present.     Mental Status: She is alert.     Deep Tendon Reflexes: Reflexes normal.  Psychiatric:        Mood and Affect: Mood normal.     Musculoskeletal Exam:  Elbows full ROM no tenderness or swelling Wrists full ROM no tenderness or swelling Fingers full ROM no tenderness or swelling Knees full ROM no tenderness or swelling  Investigation: No additional findings.  Imaging: LONG TERM MONITOR (3-14 DAYS)  Result Date: 04/05/2021 Patch Wear Time:  6 days and 2 hours (2023-01-22T17:03:34-0500 to 2023-01-28T19:32:11-498) Sinus rhythm. (min HR of  61 bpm, max HR of 176 bpm, and avg HR of 93 bpm. Isolated Premature atrial contractions were rare (<1.0%). Isolated Premature ventricular contractions were rare (<1.0%),    Recent Labs: Lab Results  Component Value Date   WBC 5.2 12/03/2020   HGB 11.7 (L) 12/03/2020   PLT 242.0 12/03/2020   NA 138 03/17/2021   K 4.0 03/17/2021   CL 104 03/17/2021   CO2 26 03/17/2021   GLUCOSE 81 03/17/2021   BUN 17 03/17/2021   CREATININE 0.77 03/17/2021   BILITOT 0.4 12/03/2020   ALKPHOS 100 12/03/2020   AST 26 12/03/2020   ALT 22 12/03/2020   PROT 7.2 12/03/2020   ALBUMIN 4.3 12/03/2020   CALCIUM 9.2 03/17/2021   GFRAA 95 08/24/2016   QFTBGOLDPLUS NEGATIVE 07/23/2020    Speciality Comments: No specialty comments available.  Procedures:  No procedures performed Allergies: Patient has no known allergies.   Assessment / Plan:     Visit Diagnoses: Raynaud's syndrome without gangrene Arthralgia, unspecified joint Positive ANA (antinuclear antibody)  Little benefit so far with amlodipine and sildenafil. She is concerned about systemic symptoms  including skin changes not just in her hands and feet. Discussed I do not see criteria of a clear connective tissue disease and labs are nonspecific. We could try a treatment with hydroxychloroquine for a possible inflammatory process maybe UCTD discussed this is not a definitive diagnosis but she has no particular risk factors for treatment. If lab results normal again will do this, discussed medication risks including need for retinal toxicity screening.  Hyperpigmentation of skin Adrenal insufficiency (HCC) - Plan: Cortisol-am, blood, ACTH  Possible, not a confirmed diagnosis. Concerned for possible adrenal insufficiency mostly based on worsening skin hyperpigmentation and severe fatigue. Her blood pressure has been low normal and no arrhythmias on monitor. My suspicion is most likely not the problem we can check AM cortisol and ACTH as screening for this future orders placed discussed need to come in before 9am.  Orders: Orders Placed This Encounter  Procedures   Cortisol-am, blood   ACTH   No orders of the defined types were placed in this encounter.    Follow-Up Instructions: No follow-ups on file.   Collier Salina, MD  Note - This record has been created using [employer].  Chart creation errors have been sought, but may not always  have been located. Such creation errors do not reflect on  the standard of medical care.

## 2021-05-03 ENCOUNTER — Ambulatory Visit: Payer: Managed Care, Other (non HMO) | Admitting: Internal Medicine

## 2021-05-03 ENCOUNTER — Other Ambulatory Visit: Payer: Self-pay

## 2021-05-03 ENCOUNTER — Encounter: Payer: Self-pay | Admitting: Internal Medicine

## 2021-05-03 VITALS — BP 109/70 | HR 85 | Resp 14 | Ht 63.25 in | Wt 131.0 lb

## 2021-05-03 DIAGNOSIS — L819 Disorder of pigmentation, unspecified: Secondary | ICD-10-CM | POA: Insufficient documentation

## 2021-05-03 DIAGNOSIS — M2550 Pain in unspecified joint: Secondary | ICD-10-CM | POA: Diagnosis not present

## 2021-05-03 DIAGNOSIS — I7300 Raynaud's syndrome without gangrene: Secondary | ICD-10-CM | POA: Diagnosis not present

## 2021-05-03 DIAGNOSIS — R768 Other specified abnormal immunological findings in serum: Secondary | ICD-10-CM | POA: Diagnosis not present

## 2021-05-03 DIAGNOSIS — E2740 Unspecified adrenocortical insufficiency: Secondary | ICD-10-CM

## 2021-05-03 NOTE — Patient Instructions (Addendum)
I am putting lab orders for checking the cortisol and ACTH hormones these need to be drawn first thing in the morning can be checked at a lab visit tomorrow. ? ?If they are normal adrenal insufficiency is definitely not a cause for your symptoms, if abnormal would need additional workup. ? ?If this doesn't seem to be a problem we can try starting hydroxychloroquine for suspected autoimmune disease affecting skin and joints and circulation although I am not sure of exact process with labs all being normal. ?

## 2021-05-04 ENCOUNTER — Other Ambulatory Visit: Payer: Self-pay | Admitting: *Deleted

## 2021-05-04 DIAGNOSIS — E2740 Unspecified adrenocortical insufficiency: Secondary | ICD-10-CM

## 2021-05-04 DIAGNOSIS — L819 Disorder of pigmentation, unspecified: Secondary | ICD-10-CM

## 2021-05-07 ENCOUNTER — Other Ambulatory Visit: Payer: Self-pay | Admitting: *Deleted

## 2021-05-07 LAB — ACTH: C206 ACTH: 10 pg/mL (ref 6–50)

## 2021-05-07 LAB — CORTISOL-AM, BLOOD: Cortisol - AM: 13.1 ug/dL

## 2021-05-07 MED ORDER — HYDROXYCHLOROQUINE SULFATE 200 MG PO TABS: 200.0000 mg | ORAL_TABLET | Freq: Every day | ORAL | 0 refills | Status: DC

## 2021-05-07 NOTE — Progress Notes (Signed)
Labs show completely normal cortisol and ACTH level. This means the problem is not adrenal insufficiency. ?After our discussion and reviewing the information is she okay with starting the hydroxychloroquine? Starting dose would be 200 mg (1 tablet) daily and follow up in clinic in 2-3 months.

## 2021-05-09 ENCOUNTER — Encounter: Payer: Self-pay | Admitting: Physician Assistant

## 2021-05-10 NOTE — Telephone Encounter (Signed)
Please advise or would you like him to schedule an office visit to discuss? ? ?

## 2021-05-11 ENCOUNTER — Other Ambulatory Visit: Payer: Self-pay

## 2021-05-11 DIAGNOSIS — E349 Endocrine disorder, unspecified: Secondary | ICD-10-CM

## 2021-05-21 ENCOUNTER — Encounter: Payer: Self-pay | Admitting: Internal Medicine

## 2021-05-24 ENCOUNTER — Encounter: Payer: Self-pay | Admitting: Physician Assistant

## 2021-05-24 ENCOUNTER — Encounter: Payer: Self-pay | Admitting: Diagnostic Neuroimaging

## 2021-05-24 ENCOUNTER — Other Ambulatory Visit: Payer: Self-pay | Admitting: Physician Assistant

## 2021-05-24 ENCOUNTER — Encounter: Payer: Self-pay | Admitting: Internal Medicine

## 2021-05-24 DIAGNOSIS — R799 Abnormal finding of blood chemistry, unspecified: Secondary | ICD-10-CM

## 2021-05-25 ENCOUNTER — Telehealth: Payer: Self-pay

## 2021-05-25 ENCOUNTER — Ambulatory Visit: Payer: Managed Care, Other (non HMO) | Admitting: Diagnostic Neuroimaging

## 2021-05-25 ENCOUNTER — Encounter: Payer: Self-pay | Admitting: Diagnostic Neuroimaging

## 2021-05-25 VITALS — BP 109/66 | HR 94 | Ht 60.75 in | Wt 131.2 lb

## 2021-05-25 DIAGNOSIS — R200 Anesthesia of skin: Secondary | ICD-10-CM | POA: Diagnosis not present

## 2021-05-25 DIAGNOSIS — R253 Fasciculation: Secondary | ICD-10-CM | POA: Diagnosis not present

## 2021-05-25 DIAGNOSIS — R202 Paresthesia of skin: Secondary | ICD-10-CM

## 2021-05-25 NOTE — Telephone Encounter (Signed)
Lab results faxed as requested.

## 2021-05-25 NOTE — Progress Notes (Signed)
? ?[facility] ? ?[name] ?DOB: Aug 20, 1980 ? ?REFERRING CLINICIAN: Inda Coke, PA ?HISTORY FROM: patient  ?REASON FOR VISIT: follow up ? ? ?HISTORICAL ? ?CHIEF COMPLAINT:  ?Chief Complaint  ?Patient presents with  ? Numbness and tingling  ?  Rm 7 "FU requested for ongoing symptoms"  ? ? ?HISTORY OF PRESENT ILLNESS:  ? ?UPDATE (05/25/21, VRP): Since last visit, doing about the same. Symptoms are continuing. Severity is moderate. Numbness, twitching, vision changes, fingernail changes, color changes. Has positive ANA. Now on plaquenil per rheumatology.  ? ?PRIOR HPI: 41 year old female here for evaluation of numbness tingling, twitching. ? ?Jul 18, 2020 patient had onset of throbbing headache and sensitive to light.  Within a few days she developed rash over her left forehead and left periorbital region.  She was diagnosed with shingles and treated with prednisone and antivirals.  Over the next few days she developed numbness and tingling in her hands, feet, twitching in the muscles, decreased tone strength, decreased grip strength.  Over the past week her symptoms have worsened. ? ?Patient had been having some issues with memory loss of brain fog even before shingles attack.  She has had some abnormal vision changes. ? ? ?REVIEW OF SYSTEMS: Full 14 system review of systems performed and negative with exception of: as per HPI.  ? ?ALLERGIES: ?No Known Allergies ? ?HOME MEDICATIONS: ?Outpatient Medications Prior to Visit  ?Medication Sig Dispense Refill  ? Cholecalciferol (VITAMIN D3) 125 MCG (5000 UT) TBDP Take by mouth.    ? clindamycin (CLINDAGEL) 1 % gel Apply topically as needed.    ? ferrous sulfate 325 (65 FE) MG EC tablet Take 1 tablet (325 mg total) by mouth daily with breakfast. (Patient taking differently: Take 325 mg by mouth every other day.)  3  ? hydroxychloroquine (PLAQUENIL) 200 MG tablet Take 1 tablet (200 mg total) by mouth daily. 90 tablet 0  ? loteprednol  (LOTEMAX) 0.5 % ophthalmic suspension SMARTSIG:In Eye(s)    ? ?No facility-administered medications prior to visit.  ? ? ?PAST MEDICAL HISTORY: ?Past Medical History:  ?Diagnosis Date  ? Alcoholism ([place])   ? Anemia   ? Gallstones   ? History of chicken pox   ? Primary sclerosing cholangitis   ? Shingles   ? Shingles 07/18/2020  ? Vaginal delivery 2003  ? ? ?PAST SURGICAL HISTORY: ?Past Surgical History:  ?Procedure Laterality Date  ? CESAREAN SECTION  08/08/2006  ? CHOLECYSTECTOMY  2003  ? ? ?FAMILY HISTORY: ?Family History  ?Problem Relation Age of Onset  ? Mental illness Mother   ?     paranoid schizophrenia  ? Hyperlipidemia Mother   ? Alcohol abuse Father   ? Colon polyps Father   ? Hypertension Father   ? Heart murmur Brother   ? Cancer Maternal Grandmother   ?     uncertain type  ? Healthy Daughter   ? Asthma Daughter   ? Learning disabilities Daughter   ? Healthy Son   ? Diabetes Paternal Aunt   ? ? ?SOCIAL HISTORY: ?Social History  ? ?Socioeconomic History  ? Marital status: Married  ?  Spouse name: Merrilee Seashore  ? Number of children: 2  ? Years of education: Not on file  ? Highest education level: Bachelor's degree (e.g., BA, AB, BS)  ?Occupational History  ? Occupation: Field seismologist  ?Tobacco Use  ? Smoking status: Never  ? Smokeless tobacco: Never  ?Vaping Use  ? Vaping Use: Never used  ?  Substance and Sexual Activity  ? Alcohol use: Yes  ?  Comment: Sparingly: special occasions only; 1 glass  ? Drug use: No  ? Sexual activity: Yes  ?  Birth control/protection: None  ?  Comment: My husband cannot have children  ?Other Topics Concern  ? Not on file  ?Social History Narrative  ? 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 1 son born 2003 1 daughter born 2008  ? Alcohol use regular for 10+ years stopped 01/18/2020 was 2 to 3 glasses of wine daily  ? No drug use  ? Coffee and tea 2 or 3 a day for caffeine  ? ?Social Determinants of Health  ? ?Financial Resource Strain: Not on  file  ?Food Insecurity: Not on file  ?Transportation Needs: Not on file  ?Physical Activity: Not on file  ?Stress: Not on file  ?Social Connections: Not on file  ?Intimate Partner Violence: Not on file  ? ? ? ?PHYSICAL EXAM ? ?GENERAL EXAM/CONSTITUTIONAL: ?Vitals:  ?Vitals:  ? 05/25/21 1525  ?BP: 109/66  ?Pulse: 94  ?Weight: 131 lb 3.2 oz (59.5 kg)  ?Height: 5' 0.75" (1.543 m)  ? ?Body mass index is 24.99 kg/m?. ?Wt Readings from Last 3 Encounters:  ?05/25/21 131 lb 3.2 oz (59.5 kg)  ?05/03/21 131 lb (59.4 kg)  ?04/07/21 129 lb (58.5 kg)  ? ?Patient is in no distress; well developed, nourished and groomed; neck is supple ? ?CARDIOVASCULAR: ?Examination of carotid arteries is normal; no carotid bruits ?Regular rate and rhythm, no murmurs ?Examination of peripheral vascular system by observation and palpation is normal ? ?EYES: ?Ophthalmoscopic exam of optic discs and posterior segments is normal; no papilledema or hemorrhages ?No results found. ? ?MUSCULOSKELETAL: ?Gait, strength, tone, movements noted in Neurologic exam below ? ?NEUROLOGIC: ?MENTAL STATUS:  ?   ? View : No data to display.  ?  ?  ?  ? ?awake, alert, oriented to person, place and time ?recent and remote memory intact ?normal attention and concentration ?language fluent, comprehension intact, naming intact ?fund of knowledge appropriate ? ?CRANIAL NERVE:  ?2nd - no papilledema on fundoscopic exam ?2nd, 3rd, 4th, 6th - pupils equal and reactive to light, visual fields full to confrontation, extraocular muscles intact, no nystagmus ?5th - facial sensation symmetric ?7th - facial strength symmetric ?8th - hearing intact ?9th - palate elevates symmetrically, uvula midline ?11th - shoulder shrug symmetric ?12th - tongue protrusion midline ? ?MOTOR:  ?normal bulk and tone, full strength in the BUE, BLE ? ?SENSORY:  ?normal and symmetric to light touch ? ?COORDINATION:  ?finger-nose-finger, fine finger movements normal ? ?REFLEXES:  ?deep tendon reflexes  present and symmetric ? ?GAIT/STATION:  ?narrow based gait ? ? ? ? ?DIAGNOSTIC DATA (LABS, IMAGING, TESTING) ?- I reviewed patient records, labs, notes, testing and imaging myself where available. ? ?Lab Results  ?Component Value Date  ? WBC 5.2 12/03/2020  ? HGB 11.7 (L) 12/03/2020  ? HCT 36.2 12/03/2020  ? MCV 82.6 12/03/2020  ? PLT 242.0 12/03/2020  ? ?   ?Component Value Date/Time  ? NA 138 03/17/2021 1032  ? NA 144 08/24/2016 0900  ? K 4.0 03/17/2021 1032  ? CL 104 03/17/2021 1032  ? CO2 26 03/17/2021 1032  ? GLUCOSE 81 03/17/2021 1032  ? BUN 17 03/17/2021 1032  ? BUN 17 08/24/2016 0900  ? CREATININE 0.77 03/17/2021 1032  ? CREATININE 0.82 12/02/2019 0823  ? CALCIUM 9.2 03/17/2021 1032  ?  PROT 7.2 12/03/2020 0922  ? PROT 7.3 12/21/2016 1222  ? ALBUMIN 4.3 12/03/2020 0922  ? ALBUMIN 4.5 12/21/2016 1222  ? AST 26 12/03/2020 0922  ? ALT 22 12/03/2020 0922  ? ALKPHOS 100 12/03/2020 0922  ? BILITOT 0.4 12/03/2020 0922  ? BILITOT <0.2 12/21/2016 1222  ? GFRNONAA 83 08/24/2016 0900  ? GFRAA 95 08/24/2016 0900  ? ?Lab Results  ?Component Value Date  ? CHOL 166 12/03/2020  ? HDL 77.80 12/03/2020  ? [place] 80 12/03/2020  ? TRIG 43.0 12/03/2020  ? CHOLHDL 2 12/03/2020  ? ?Lab Results  ?Component Value Date  ? HGBA1C 5.3 12/03/2020  ? ?Lab Results  ?Component Value Date  ? YQMVHQIO96 533 08/25/2020  ? ?Lab Results  ?Component Value Date  ? TSH 1.13 08/25/2020  ? ? ?09/09/20 Normal MRI brain (with and without).  ? ?09/10/20 EMG/NCS ?- normal ? ? ?ASSESSMENT AND PLAN ? ?41 y.o. year old female here with numbness, twitching, brain fog, vision changes before and after shingles attack in May 2022. ? ?Dx: ? ?1. Numbness and tingling   ?2. Twitching   ? ? ? ? ?PLAN: ? ?NUMBNESS / TWITCHING / BRAIN FOG / VISUAL CHANGES ?-unclear etiology; could be post-viral phenomenon ?- check MRI cervical, thoracic spine and labs ?- consider gabapentin, duloxetine, pregabalin ? ?Orders Placed This Encounter  ?Procedures  ? MR CERVICAL SPINE W WO  CONTRAST  ? MR THORACIC SPINE W WO CONTRAST  ? Vitamin B12  ? MMA  ? Homocysteine  ? A1c  ? TSH  ? SPEP with IFE  ? HIV  ? RPR  ? Vitamin B1  ? Vitamin B6  ? ANCA Profile  ? Celiac Ab TTG DGP TIGA  ? [place]

## 2021-05-25 NOTE — Telephone Encounter (Signed)
Patient called stating she has an appointment with Marella Chimes at [facility] tomorrow morning, 05/26/21 at 8:00 am.  Patient just received a call from the office requesting her labwork results be faxed to their office. ?Fax [phone] ? ?

## 2021-05-28 ENCOUNTER — Telehealth: Payer: Self-pay | Admitting: Diagnostic Neuroimaging

## 2021-05-28 NOTE — Telephone Encounter (Signed)
cigna order sent to [facility], they will obtain the auth and reach out to the patient to schedule.  ?

## 2021-06-03 ENCOUNTER — Other Ambulatory Visit (INDEPENDENT_AMBULATORY_CARE_PROVIDER_SITE_OTHER): Payer: Managed Care, Other (non HMO)

## 2021-06-03 DIAGNOSIS — R799 Abnormal finding of blood chemistry, unspecified: Secondary | ICD-10-CM | POA: Diagnosis not present

## 2021-06-03 LAB — BASIC METABOLIC PANEL
BUN: 15 mg/dL (ref 6–23)
CO2: 23 mEq/L (ref 19–32)
Calcium: 9.2 mg/dL (ref 8.4–10.5)
Chloride: 104 mEq/L (ref 96–112)
Creatinine, Ser: 0.89 mg/dL (ref 0.40–1.20)
GFR: 81.6 mL/min (ref >60.00)
Glucose, Bld: 81 mg/dL (ref 70–99)
Potassium: 4.2 mEq/L (ref 3.5–5.1)
Sodium: 136 mEq/L (ref 135–145)

## 2021-06-04 LAB — ANCA PROFILE
Anti-MPO Antibodies: <0.2 units (ref 0.0–0.9)
Anti-PR3 Antibodies: <0.2 units (ref 0.0–0.9)
Atypical pANCA: <1:20 {titer}
C-ANCA: <1:20 {titer}
P-ANCA: <1:20 {titer}

## 2021-06-04 LAB — MULTIPLE MYELOMA PANEL, SERUM
Albumin SerPl Elph-Mcnc: 3.9 g/dL (ref 2.9–4.4)
Albumin/Glob SerPl: 1.4 (ref 0.7–1.7)
Alpha 1: 0.2 g/dL (ref 0.0–0.4)
Alpha2 Glob SerPl Elph-Mcnc: 0.7 g/dL (ref 0.4–1.0)
B-Globulin SerPl Elph-Mcnc: 1 g/dL (ref 0.7–1.3)
Gamma Glob SerPl Elph-Mcnc: 1 g/dL (ref 0.4–1.8)
Globulin, Total: 2.9 g/dL (ref 2.2–3.9)
IgA/Immunoglobulin A, Serum: 120 mg/dL (ref 87–352)
IgG (Immunoglobin G), Serum: 1018 mg/dL (ref 586–1602)
IgM (Immunoglobulin M), Srm: 121 mg/dL (ref 26–217)
Total Protein: 6.8 g/dL (ref 6.0–8.5)

## 2021-06-04 LAB — ACHR ABS WITH REFLEX TO MUSK: AChR Binding Ab, Serum: <0.03 nmol/L (ref 0.00–0.24)

## 2021-06-04 LAB — HIV ANTIBODY (ROUTINE TESTING W REFLEX): HIV Screen 4th Generation wRfx: NONREACTIVE

## 2021-06-04 LAB — RPR: RPR Ser Ql: NONREACTIVE

## 2021-06-04 LAB — CELIAC AB TTG DGP TIGA
Antigliadin Abs, IgA: 5 units (ref 0–19)
Gliadin IgG: 5 units (ref 0–19)
Tissue Transglut Ab: 3 U/mL (ref 0–5)
Transglutaminase IgA: <2 U/mL (ref 0–3)

## 2021-06-04 LAB — VITAMIN B1: Thiamine: 123.1 nmol/L (ref 66.5–200.0)

## 2021-06-04 LAB — HEAVY METALS, BLOOD
Arsenic: 1 ug/L (ref 0–9)
Lead, Blood: <1 ug/dL (ref 0.0–3.4)
Mercury: <1 ug/L (ref 0.0–14.9)

## 2021-06-04 LAB — HOMOCYSTEINE: Homocysteine: 9.2 umol/L (ref 0.0–14.5)

## 2021-06-04 LAB — ANGIOTENSIN CONVERTING ENZYME: Angio Convert Enzyme: 44 U/L (ref 14–82)

## 2021-06-04 LAB — NEUROMYELITIS OPTICA AUTOAB, IGG: NMO IgG Autoantibodies: <1.5 U/mL (ref 0.0–3.0)

## 2021-06-04 LAB — TSH: TSH: 1.48 u[IU]/mL (ref 0.450–4.500)

## 2021-06-04 LAB — VITAMIN B6: Vitamin B6: 15.6 ug/L (ref 3.4–65.2)

## 2021-06-04 LAB — VITAMIN B12: Vitamin B-12: 650 pg/mL (ref 232–1245)

## 2021-06-04 LAB — METHYLMALONIC ACID, SERUM: Methylmalonic Acid: 122 nmol/L (ref 0–378)

## 2021-06-04 LAB — HEMOGLOBIN A1C
Est. average glucose Bld gHb Est-mCnc: 103 mg/dL
Hgb A1c MFr Bld: 5.2 % (ref 4.8–5.6)

## 2021-06-04 LAB — ALDOLASE: Aldolase: 4.8 U/L (ref 3.3–10.3)

## 2021-06-04 LAB — MUSK ANTIBODIES: MuSK Antibodies: <1 U/mL

## 2021-06-04 LAB — CK: Total CK: 108 U/L (ref 32–182)

## 2021-06-10 ENCOUNTER — Ambulatory Visit: Payer: Managed Care, Other (non HMO) | Admitting: Podiatry

## 2021-06-10 ENCOUNTER — Encounter: Payer: Self-pay | Admitting: Podiatry

## 2021-06-10 DIAGNOSIS — L819 Disorder of pigmentation, unspecified: Secondary | ICD-10-CM | POA: Diagnosis not present

## 2021-06-10 DIAGNOSIS — L739 Follicular disorder, unspecified: Secondary | ICD-10-CM | POA: Insufficient documentation

## 2021-06-10 DIAGNOSIS — L608 Other nail disorders: Secondary | ICD-10-CM | POA: Diagnosis not present

## 2021-06-10 NOTE — Progress Notes (Signed)
?  Subjective:  ?Patient ID: Alicia Carr, female    DOB: Jun 24, 1980,  MRN: [id number] ? ?Chief Complaint  ?Patient presents with  ? Nail Problem  ?    (np) L 2nd toe with longitude lines c/w lingitudinal melanonychia  ? ? ?41 y.o. female presents with the above complaint. History confirmed with patient.  She presents today for consultation and referral from her dermatologist.  She has been developing multiple longitudinal melanonychia and discoloration of multiple toenails and fingernails.  Most notably the left second toenail has been affected.  She has had issues with skin lesions and joint pain and is seeing rheumatology and dermatology.  They are trying to find out the source of this.  Due to the increasing nature of the melanonychia biopsy was recommended and she presents for this today. ? ?Objective:  ?Physical Exam: ?warm, good capillary refill, no trophic changes or ulcerative lesions, normal DP and PT pulses, normal sensory exam, and there are multiple discolorations of several toenails and multiple areas of melanonychia most notably the left second that has a central dark stripe which penetrates to the level of the proximal nail fold but there is no Hutchinson sign noted in any toenail. ? ? ? ? ? ?Assessment:  ? ?1. Longitudinal melanonychia   ?2. Pigmented skin lesion of uncertain nature   ? ? ? ?Plan:  ?Patient was evaluated and treated and all questions answered. ? ?I discussed the lesions in detail with the patient.  I discussed with her that while I am not sure of the exact etiology of why multiple longitudinal melanonychia would be developing in such a short period of time but I do agree with the rationale for a biopsy.  She has no history personally or family history of skin cancer or melanoma.  We discussed the risk and benefits of biopsy.  We discussed that biopsy of the nail matrix may result in nail dystrophy or nail damage that may not be reversible.  She understands and wishes to proceed  today.  Following verbal consent I anesthetized the left second toe with 1.5 cc each of 2% lidocaine plain and 0.5% Marcaine plain.  Sterile prep with Betadine and a tourniquet was applied.  I began by using a Soil scientist to free the soft tissue attachments of the nail plate.  This nail plate was then completely removed and avulsed.  It was placed in a specimen bag and sent for a separate pathologic analysis to evaluate for melanin as well as onychomycosis.  Following removal of the nail plate I then retracted the soft tissue fold of the proximal nail fold, a 2 mm punch biopsy was then used to take a sample of the nail matrix of the visible pigmented area.  Gross inspection showed that the pigmented area did not penetrate beyond the area of the nail matrix.  This was sent in formalin for pathology analysis.  It was then dressed with Silvadene and dry sterile dressings.  Post care instructions were given.  [name] back in 4 weeks for follow-up ? ?Return in about 4 weeks (around 07/08/2021) for nail re-check.  ? ?

## 2021-06-10 NOTE — Patient Instructions (Signed)
Nail Instructions ? ? ? ?THE DAY AFTER THE PROCEDURE ? ?Remove the bandage, you may shower, blot dry the affected area and cover.  You may use a band aid large enough to cover the area or use gauze and tape.  Apply neosporin before the bandage ? ? ?Monitor for any signs/symptoms of infection. Call the office immediately if any occur or go directly to the emergency room. Call with any questions/concerns. ? ?

## 2021-06-15 ENCOUNTER — Ambulatory Visit
Admission: RE | Admit: 2021-06-15 | Discharge: 2021-06-15 | Disposition: A | Discharge status: Home / Self Care | Payer: Managed Care, Other (non HMO) | Source: Ambulatory Visit | Attending: Diagnostic Neuroimaging | Admitting: Diagnostic Neuroimaging

## 2021-06-15 ENCOUNTER — Encounter: Payer: Self-pay | Admitting: Podiatry

## 2021-06-15 DIAGNOSIS — R202 Paresthesia of skin: Secondary | ICD-10-CM | POA: Diagnosis not present

## 2021-06-15 DIAGNOSIS — R253 Fasciculation: Secondary | ICD-10-CM

## 2021-06-15 DIAGNOSIS — R200 Anesthesia of skin: Secondary | ICD-10-CM

## 2021-06-15 MED ORDER — GADOBENATE DIMEGLUMINE 529 MG/ML IV SOLN
12.0000 mL | Freq: Once | INTRAVENOUS | Status: AC | PRN
  Administered 2021-06-15: 12 mL via INTRAVENOUS

## 2021-07-08 ENCOUNTER — Ambulatory Visit: Payer: Managed Care, Other (non HMO) | Admitting: Podiatry

## 2021-07-08 DIAGNOSIS — L608 Other nail disorders: Secondary | ICD-10-CM

## 2021-07-08 NOTE — Telephone Encounter (Signed)
Please advise her concerns below ? ?"I'm coming up on a year since I first had my viral infection with shingles and still don't feel like I have an answer as to why I would start having these twitching symptoms 6 weeks after- infection that still have not gone away. Is there anything else that the doctor would recommend that I should look into or where do I go from here? Thanks" ?

## 2021-07-12 ENCOUNTER — Encounter: Payer: Self-pay | Admitting: Internal Medicine

## 2021-07-12 NOTE — Progress Notes (Signed)
?  Subjective:  ?Patient ID: Alicia Carr, female    DOB: November 22, 1980,  MRN: [id number] ? ?Chief Complaint  ?Patient presents with  ? Follow-up  ?  4wk for nail recheck  ? ? ?41 y.o. female presents with the above complaint. History confirmed with patient.  She presents today for consultation and referral from her dermatologist.  She has been developing multiple longitudinal melanonychia and discoloration of multiple toenails and fingernails.  Most notably the left second toenail has been affected.  She has had issues with skin lesions and joint pain and is seeing rheumatology and dermatology.  They are trying to find out the source of this.  Due to the increasing nature of the melanonychia biopsy was recommended and she presents for this today. ? ? ?Interval history: ?She returns today for follow-up.  Says the biopsy site has been healing well the nail has not quite started regrowing yet. ?Objective:  ?Physical Exam: ?warm, good capillary refill, no trophic changes or ulcerative lesions, normal DP and PT pulses, normal sensory exam, and there are multiple discolorations of several toenails and multiple areas of melanonychia most notably the left second that has a central dark stripe which penetrates to the level of the proximal nail fold but there is no Hutchinson sign noted  ? ? ? ? ? ? ? ? ? ? ? ? ?Assessment:  ? ?1. Longitudinal melanonychia   ? ? ? ?Plan:  ?Patient was evaluated and treated and all questions answered. ? ?We reviewed the results of the [facility] pathology biopsy that was completed.  We discussed that there is no evidence of melanoma or atypia or malignancy.  Unfortunately I do not necessarily have a complete explanation as to why she has developed these.  I did provide her with some picture on the presence of melanonychia especially in syndromic presentation.  We discussed that this could possibly be drug-induced or exacerbated or genetic component such as Laugier-Hunziker syndrome which is a  benign presentation of multiple 1-2 normal laminectomy in the fingernails and toenails.  She does fit the clinical picture of usual presentation between 41 and 26 years of age and has a family history.  I do not think these are related to a more serious condition such as other syndromes and related to severe GI symptoms.  She does take hydroxychloroquine and these have notable side effects of hyperpigmentation and worsening although she says it started probably before she started the Plaquenil as well as iron supplementation and has worsened since then.  We discussed the possible changes that could arise in his melanonychia and if they continue to exacerbate or worsen or show signs such as a Hutchinson sign that she will let me know there is a wider biopsy may be indicated at that point. ? ?Return if symptoms worsen or fail to improve.  ? ?

## 2021-07-19 NOTE — Progress Notes (Signed)
Office Visit Note  [name]             Date of Birth: 24-Dec-1980           MRN: [id number]             PCP: Inda Coke, PA Referring: Inda Coke, PA Visit Date: 08/02/2021   Subjective:  Follow-up (PLQ helping some, joint pain improving, patient is drinking alcohol more)   History of Present Illness: Alicia Carr is a 41 y.o. female here for follow up for raynaud's symptoms, currently taking plaquenil 200 mg by mouth once daily. Since our last visit she feels the joint pain and stiffness improved from before. She also has less raynaud's symptoms than before, although this is not unusual during warmer time of the year. She continues having the small areas of twitching from before and saw Dr. Leta Baptist for this with workup including broad screening antibody testing and imaging of cervical and thoracic spine without major abnormalities. She reports progression of skin hyperpigmentation changes affecting her in multiple areas in patches and also some isolated small hyperpigmented spots on her face and palms and longitudinal changes in her fingernails. She had a nail biopsy consistent with melanotic macule. She saw Marella Chimes for this problem who had some concern about possible autoimmune vs genetic or proliferative causes. Symptoms started before exposure to hydroxychloroquine but also concern for exacerbating symptoms. She has a lot of healthcare related anxiety surrounding this problem, worse after a coworker passed unexpected from metastatic pancreatic cancer recently. She is drinking alcohol more frequently now and has started to gain weight and feels this is from depressed mood.  Previous HPI 05/03/2021 Alicia Carr is a 41 y.o. female here for follow up for raynaud's symptoms after starting sildenafil 20 mg. She continued noticing finger discoloration and rashes. She is also concerned with noticing some hyperpigmentation on the palms of her hands and also  darkening of her gums. Still having some twitching and aches and overall concerned she is not feeling well. She had a mild amount of leg swelling intermittently. She is concerned about if her symptoms could be an adrenal or hormonal issue with ongoing skin changes and generalized symptoms.   Previous HPI 04/07/21 Alicia Carr is a 41 y.o. female here for follow up with numerous different symptoms ongoing some in exacerbation.  So far addition of the amlodipine has not significantly helped her finger or toe discoloration is actually having more blue and violaceous changes than before in the fingers.  She feels extremely cold much of the time somewhat regardless of the ambient temperature.  Chronic persistent fatigue remains her most significant issue.  She also continues having muscle twitching or inability to fully close or extend for periods of time the seem to come and go without trigger.  She has some hand swelling no but no increase in leg swelling.  She was feeling some palpitations and wore a heart monitor apparently this showed some episodes of moderate tachycardia but without concerning arrhythmias seen.   Previous HPI 10/06/20 Alicia Carr is a 41 y.o. female here with joint pains and numbness affecting bilateral fingers and toes that is worse especially in the past few months.  She developed shingles outbreak on the face and left eye in May this is treated and symptoms have improved besides some residual hyperpigmentation.  However also slightly earlier this year was recently diagnosed with [place] by biopsy during evaluation of abdominal symptoms and  abnormal liver function tests.  She reports morning stiffness difficulty gripping her hands tightly or performing tasks first thing in the morning this improves some time and is normal by the afternoon.  She notices occasional swelling or puffiness especially in the feet but not all the time.  She denies discoloration such as pallor, erythema, or  cyanosis.  She tried taking oral diclofenac for joint pains but developed an episode of painless hematochezia so stopped this medication. She denies any lymphadenopathy, photosensitive rashes, or history of blood clots.   Review of Systems  Constitutional:  Positive for fatigue.  HENT:  Negative for mouth dryness.   Eyes:  Negative for dryness.  Respiratory:  Negative for shortness of breath.   Cardiovascular:  Negative for swelling in legs/feet.  Gastrointestinal:  Positive for constipation and diarrhea.  Endocrine: Negative for excessive thirst.  Genitourinary:  Negative for difficulty urinating.  Musculoskeletal:  Negative for morning stiffness.  Skin:  Positive for color change and redness.  Allergic/Immunologic: Positive for susceptible to infections.  Neurological:  Positive for numbness.  Hematological:  Positive for bruising/bleeding tendency.  Psychiatric/Behavioral:  Positive for sleep disturbance.    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 cause 06/18/2020    Past Medical History:  Diagnosis Date   Alcoholism ([place])    Anemia    Gallstones    History of chicken pox    Primary sclerosing cholangitis    Shingles    Shingles 07/18/2020   Vaginal delivery 2003    Family History  Problem Relation Age of Onset   Mental illness Mother        paranoid schizophrenia   Hyperlipidemia Mother    Alcohol abuse Father    Colon polyps Father    Hypertension Father    Heart murmur Brother    Cancer Maternal Grandmother        uncertain type   Healthy Daughter    Asthma  Daughter    Learning disabilities Daughter    Healthy Son    Diabetes Paternal Aunt    Past Surgical History:  Procedure Laterality Date   CESAREAN SECTION  08/08/2006   CHOLECYSTECTOMY  2003   Social History   Social History Narrative   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, [name]   1 son born 2003 1 daughter born 2008   Alcohol use regular for 10+ years stopped 01/18/2020 was 2 to 3 glasses of wine daily   No drug use   Coffee and tea 2 or 3 a day for caffeine   Immunization History  Administered Date(s) Administered   Hep A / Hep B 09/08/2020, 10/12/2020   Hepatitis A, Adult 03/12/2021   Hepatitis B, ped/adol 03/12/2021, 03/12/2021   Influenza,inj,Quad PF,6+ Mos 12/26/2018, 11/29/2019, 12/03/2020   Influenza,inj,quad, With Preservative 11/29/2019   PFIZER(Purple Top)SARS-COV-2 Vaccination 05/02/2019, 05/23/2019, 01/03/2020, 10/06/2020   Tdap 05/10/2006, 11/29/2019     Objective: Vital Signs: BP 111/77 (BP Location: Left Arm, Patient Position: Sitting, Cuff Size: Normal)   Pulse 82   Resp 14   Ht 5' 0.75" (1.543 m)   Wt 136 lb (61.7  kg)   BMI 25.91 kg/m    Physical Exam HENT:     Mouth/Throat:     Comments: The border telangiectasia present Cardiovascular:     Rate and Rhythm: Normal rate and regular rhythm.  Pulmonary:     Effort: Pulmonary effort is normal.     Breath sounds: Normal breath sounds.  Musculoskeletal:     Right lower leg: No edema.     Left lower leg: No edema.  Skin:    General: Skin is warm and dry.     Comments: Telangiectasias on fingers and palms bilaterally, nonspecific nailfold capillary changes, patchy skin hypopigmentation on upper arm  Neurological:     Mental Status: She is alert.  Psychiatric:        Mood and Affect: Mood normal.      Musculoskeletal Exam: Shoulders full ROM no tenderness or swelling Elbows full ROM no tenderness or swelling Wrists full ROM no tenderness or swelling Fingers full  ROM no tenderness or swelling Knees full ROM no tenderness or swelling Ankles full ROM no tenderness or swelling   Investigation: No additional findings.  Imaging: No results found.  Recent Labs: Lab Results  Component Value Date   WBC 5.2 12/03/2020   HGB 11.7 (L) 12/03/2020   PLT 242.0 12/03/2020   NA 136 06/03/2021   K 4.2 06/03/2021   CL 104 06/03/2021   CO2 23 06/03/2021   GLUCOSE 81 06/03/2021   BUN 15 06/03/2021   CREATININE 0.89 06/03/2021   BILITOT 0.4 12/03/2020   ALKPHOS 100 12/03/2020   AST 26 12/03/2020   ALT 22 12/03/2020   PROT 6.8 05/25/2021   ALBUMIN 4.3 12/03/2020   CALCIUM 9.2 06/03/2021   GFRAA 95 08/24/2016   QFTBGOLDPLUS NEGATIVE 07/23/2020    Speciality Comments: PLQ Eye Exam 08/2021 appt per patient   Procedures:  No procedures performed Allergies: Patient has no known allergies.   Assessment / Plan:     Visit Diagnoses: Raynaud's syndrome without gangrene Arthralgia, unspecified joint Positive ANA (antinuclear antibody)  Symptoms are ongoing although still somewhat nonspecific.  No specific ANA antibody associations for the suspected PSC.  Raynaud's symptoms continue no digital injury or evidence of critical ischemia.  Seems to be seeing some improvement in joints with the hydroxychloroquine 200 mg daily that would suggest inflammatory arthritis component.  High risk medication use - Plaquenil '200mg'$  by mouth daily.  Hyperpigmentation of skin  Skin biopsy with finding of melanotic macule which is pretty nonspecific although benign finding.  Question whether the addition of hydroxychloroquine could be contributing to skin hyperpigmentation although the skin symptoms preceded the new medication start so would not be a cause may be contributory.  Adrenal insufficiency (HCC) - Possible, not a confirmed diagnosis. Concerned for possible adrenal insufficiency mostly based on worsening skin hyperpigmentation and severe fatigue.   Orders: No  orders of the defined types were placed in this encounter.  No orders of the defined types were placed in this encounter.    Follow-Up Instructions: Return in about 3 months (around 11/02/2021) for Raynaud's/pigmentation/PSC f/u 23mo.   CCollier Salina MD  Note - This record has been created using [employer]  Chart creation errors have been sought, but may not always  have been located. Such creation errors do not reflect on  the standard of medical care.

## 2021-07-21 ENCOUNTER — Telehealth: Payer: Self-pay | Admitting: Internal Medicine

## 2021-07-21 NOTE — Telephone Encounter (Signed)
Notes from dermatologist received and reviewed.  There is concerned about the possibility of Peutz-Jeghers syndrome.  I have reviewed this and if she has Peutz-Jeghers syndrome then pigmentation issue should have developed early in life.  Dermatology is requesting consideration for work-up for hamartomatous polyps that would indicate Peutz-Jeghers syndrome.  She did not have any on colonoscopy.  Please schedule an appointment with the patient to come into the office to review this.

## 2021-07-21 NOTE — Telephone Encounter (Signed)
Left message for pt to call back  °

## 2021-07-21 NOTE — Telephone Encounter (Signed)
Patient is returning your call.  

## 2021-07-22 NOTE — Telephone Encounter (Signed)
Pt was made aware of Dr. Carlean Purl recommendations: Pt was scheduled for an appointment on 09/08/2021 at 9:30 with Dr. Carlean Purl. Pt made aware: Pt verbalized understanding with all questions answered.

## 2021-08-02 ENCOUNTER — Encounter: Payer: Self-pay | Admitting: Internal Medicine

## 2021-08-02 ENCOUNTER — Ambulatory Visit (INDEPENDENT_AMBULATORY_CARE_PROVIDER_SITE_OTHER): Payer: Managed Care, Other (non HMO) | Admitting: Internal Medicine

## 2021-08-02 VITALS — BP 111/77 | HR 82 | Resp 14 | Ht 60.75 in | Wt 136.0 lb

## 2021-08-02 DIAGNOSIS — Z79899 Other long term (current) drug therapy: Secondary | ICD-10-CM

## 2021-08-02 DIAGNOSIS — L819 Disorder of pigmentation, unspecified: Secondary | ICD-10-CM

## 2021-08-02 DIAGNOSIS — E2740 Unspecified adrenocortical insufficiency: Secondary | ICD-10-CM

## 2021-08-02 DIAGNOSIS — R6889 Other general symptoms and signs: Secondary | ICD-10-CM | POA: Insufficient documentation

## 2021-08-02 DIAGNOSIS — R768 Other specified abnormal immunological findings in serum: Secondary | ICD-10-CM

## 2021-08-02 DIAGNOSIS — I7300 Raynaud's syndrome without gangrene: Secondary | ICD-10-CM | POA: Diagnosis not present

## 2021-08-02 DIAGNOSIS — M2550 Pain in unspecified joint: Secondary | ICD-10-CM

## 2021-08-02 DIAGNOSIS — N920 Excessive and frequent menstruation with regular cycle: Secondary | ICD-10-CM | POA: Insufficient documentation

## 2021-09-08 ENCOUNTER — Encounter: Payer: Self-pay | Admitting: Internal Medicine

## 2021-09-08 ENCOUNTER — Ambulatory Visit (INDEPENDENT_AMBULATORY_CARE_PROVIDER_SITE_OTHER): Payer: Managed Care, Other (non HMO) | Admitting: Internal Medicine

## 2021-09-08 VITALS — BP 116/64 | HR 78 | Ht 60.75 in | Wt 137.2 lb

## 2021-09-08 DIAGNOSIS — L608 Other nail disorders: Secondary | ICD-10-CM | POA: Diagnosis not present

## 2021-09-08 DIAGNOSIS — L819 Disorder of pigmentation, unspecified: Secondary | ICD-10-CM | POA: Diagnosis not present

## 2021-09-08 NOTE — Patient Instructions (Signed)
Dr Carlean Purl is going to consult with the genetic counselors and we will be back in touch.  If you are age 40 or older, your body mass index should be between 23-30. Your Body mass index is 26.14 kg/m. If this is out of the aforementioned range listed, please consider follow up with your Primary Care Provider.  If you are age 71 or younger, your body mass index should be between 19-25. Your Body mass index is 26.14 kg/m. If this is out of the aformentioned range listed, please consider follow up with your Primary Care Provider.   ________________________________________________________  The [facility] providers would like to encourage you to use [facility] to communicate with providers for non-urgent requests or questions.  Due to long hold times on the telephone, sending your provider a message by [facility] may be a faster and more efficient way to get a response.  Please allow 48 business hours for a response.  Please remember that this is for non-urgent requests.  _______________________________________________________  I appreciate the opportunity to care for you. Silvano Rusk, MD, [facility]

## 2021-09-08 NOTE — Progress Notes (Unsigned)
   Alicia Carr 41 y.o. [birth year] [id number]  Assessment & Plan:   Encounter Diagnoses  Name Primary?   Longitudinal melanonychia Yes   Hyperpigmentation of skin    The possibility of Peutz-Jeghers syndrome has been raised.  No evidence for that on colonoscopy last year.  I think it seems very unlikely that she has that as should have been noticed before age 76.  It is unclear to me how to proceed, I am not in favor of an endoscopy based upon what I know.  I will ask genetics for their input.  Genetic testing could be undertaken though its not perfect, either.  Further plans pending that communication.  CC: Inda Coke, [place] Rosita Kea, PA-C Roma Kayser, genetics counselor  Subjective:   Chief Complaint: Question Peutz-Jeghers  HPI 41 year old African-American woman with primary sclerosing cholangitis, also has a positive ANA who saw dermatology in May Otila Back, PA-C) for follow-up with concerns of progressive macules on lips hands and melanotic nail changes.  Nail biopsy demonstrated melanocytic macule without features of melanoma and she has been concerned about progressive longitudinal discoloration.  Dermatology has thought she may have Lougier-Hunziker syndrome which is a diagnosis of exclusion and apparently associated with Peutz-Jeghers.  No significant active GI complaints at this time main focus of concern expressed by the patient today is that she has progressive skin discoloration that is very concerning related to the above issues.  She is trying to get to the bottom of the cause of this.  She is also asking about FMLA paperwork to be filled out again for possible intermittent leave. No Known Allergies Current Meds  Medication Sig   loteprednol (LOTEMAX) 0.5 % ophthalmic suspension SMARTSIG:In Eye(s)   tretinoin (RETIN-A) 7.85 % cream 1 application in the evening to face   Past Medical History:  Diagnosis Date   Alcoholism ([place])    Anemia     Gallstones    History of chicken pox    Hyperpigmentation    Longitudinal melanonychia    Nevus    right dorsal wrist   Positive ANA (antinuclear antibody)    Primary sclerosing cholangitis    Pruritus    Shingles    Shingles 07/18/2020   Vaginal delivery 2003   Past Surgical History:  Procedure Laterality Date   CESAREAN SECTION  08/08/2006   CHOLECYSTECTOMY  2003   Social History   Social History Narrative   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   Review of Systems As per HPI  Objective:   Physical Exam BP 116/64   Pulse 78   Ht 5' 0.75" (1.543 m)   Wt 137 lb 3.2 oz (62.2 kg)   BMI 26.14 kg/m  Areas of observed skin show multiple freckles and macules, some discoloration under the nails as well.

## 2021-09-09 ENCOUNTER — Telehealth: Payer: Self-pay

## 2021-09-09 DIAGNOSIS — L608 Other nail disorders: Secondary | ICD-10-CM

## 2021-09-09 DIAGNOSIS — L819 Disorder of pigmentation, unspecified: Secondary | ICD-10-CM

## 2021-09-09 NOTE — Telephone Encounter (Signed)
I have sent her a [place] message letting her know her paperwork is done.

## 2021-09-09 NOTE — Telephone Encounter (Signed)
Referral has been placed. Her voicemail is full so I will send her a MyChart message to let her know.

## 2021-09-09 NOTE — Telephone Encounter (Signed)
FMLA paperwork completed. I tried to reach her and her voice mail is full to let her know she can come pick up her forms. I will have them scanned into epic.

## 2021-09-09 NOTE — Telephone Encounter (Signed)
-----   Message from Gatha Mayer, MD sent at 09/09/2021  5:32 PM EDT ----- Regarding: Needs genetics referral Please refer to genetics counselor at the cancer center to evaluate for possible Peutz-Jeghers

## 2021-09-15 ENCOUNTER — Telehealth: Payer: Self-pay | Admitting: Genetic Counselor

## 2021-09-15 NOTE — Telephone Encounter (Signed)
Scheduled appt per 7/13 referral. Pt is aware of appt date and time. Pt is aware to arrive 15 mins prior to appt time and to bring and updated insurance card. Pt is aware of appt location.   

## 2021-09-20 ENCOUNTER — Encounter: Payer: Self-pay | Admitting: Internal Medicine

## 2021-10-04 ENCOUNTER — Other Ambulatory Visit: Payer: Self-pay | Admitting: Physician Assistant

## 2021-10-04 DIAGNOSIS — Z1231 Encounter for screening mammogram for malignant neoplasm of breast: Secondary | ICD-10-CM

## 2021-10-20 NOTE — Progress Notes (Signed)
Office Visit Note  [name]             Date of Birth: 1981/02/07           MRN: [id number]             PCP: Inda Coke, PA Referring: Inda Coke, PA Visit Date: 10/26/2021   Subjective:  Follow-up (Follow up after going off PLQ. Joint pain has returned, otherwise everything else is the same--hyperpigmentation has not changed to her knowledge.)   History of Present Illness: Alicia Carr is a 41 y.o. female here for follow up for undifferentiated connective tissue disease symptoms.  Unclear regarding relation of symptoms to underlying diagnosis of early primary sclerosing cholangitis.  She stopped taking the hydroxychloroquine for concern about a contributing to skin hyperpigmentation has not seen any change regarding this.  She did notice an increase in joint pains particularly around her knuckles and in bilateral knees.  This is not debilitating or requiring her to take additional over-the-counter medication to manage.  Raynaud's symptoms have been doing better and she thinks is associated with the recent warmer weather.  Repeat lab follow-up with normal liver function test.  She followed up with Dr. Carlean Purl regarding concerns and has been referred to see a geneticist upcoming appointment on September 6.  Previous HPI 08/02/2021 Alicia Carr is a 41 y.o. female here for follow up for raynaud's symptoms, currently taking plaquenil 200 mg by mouth once daily. Since our last visit she feels the joint pain and stiffness improved from before. She also has less raynaud's symptoms than before, although this is not unusual during warmer time of the year. She continues having the small areas of twitching from before and saw Dr. Leta Baptist for this with workup including broad screening antibody testing and imaging of cervical and thoracic spine without major abnormalities. She reports progression of skin hyperpigmentation changes affecting her in multiple areas in  patches and also some isolated small hyperpigmented spots on her face and palms and longitudinal changes in her fingernails. She had a nail biopsy consistent with melanotic macule. She saw Marella Chimes for this problem who had some concern about possible autoimmune vs genetic or proliferative causes. Symptoms started before exposure to hydroxychloroquine but also concern for exacerbating symptoms. She has a lot of healthcare related anxiety surrounding this problem, worse after a coworker passed unexpected from metastatic pancreatic cancer recently. She is drinking alcohol more frequently now and has started to gain weight and feels this is from depressed mood.   Previous HPI 05/03/2021 Alicia Carr is a 41 y.o. female here for follow up for raynaud's symptoms after starting sildenafil 20 mg. She continued noticing finger discoloration and rashes. She is also concerned with noticing some hyperpigmentation on the palms of her hands and also darkening of her gums. Still having some twitching and aches and overall concerned she is not feeling well. She had a mild amount of leg swelling intermittently. She is concerned about if her symptoms could be an adrenal or hormonal issue with ongoing skin changes and generalized symptoms.   Previous HPI 04/07/21 Alicia Carr is a 41 y.o. female here for follow up with numerous different symptoms ongoing some in exacerbation.  So far addition of the amlodipine has not significantly helped her finger or toe discoloration is actually having more blue and violaceous changes than before in the fingers.  She feels extremely cold much of the time somewhat regardless of the ambient temperature.  Chronic persistent fatigue remains her most significant issue.  She also continues having muscle twitching or inability to fully close or extend for periods of time the seem to come and go without trigger.  She has some hand swelling no but no increase in leg swelling.  She was  feeling some palpitations and wore a heart monitor apparently this showed some episodes of moderate tachycardia but without concerning arrhythmias seen.   Previous HPI 10/06/20 Alicia Carr is a 41 y.o. female here with joint pains and numbness affecting bilateral fingers and toes that is worse especially in the past few months.  She developed shingles outbreak on the face and left eye in May this is treated and symptoms have improved besides some residual hyperpigmentation.  However also slightly earlier this year was recently diagnosed with [place] by biopsy during evaluation of abdominal symptoms and abnormal liver function tests.  She reports morning stiffness difficulty gripping her hands tightly or performing tasks first thing in the morning this improves some time and is normal by the afternoon.  She notices occasional swelling or puffiness especially in the feet but not all the time.  She denies discoloration such as pallor, erythema, or cyanosis.  She tried taking oral diclofenac for joint pains but developed an episode of painless hematochezia so stopped this medication. She denies any lymphadenopathy, photosensitive rashes, or history of blood clots.   Review of Systems  Constitutional:  Positive for fatigue.  HENT:  Negative for mouth sores and mouth dryness.   Eyes:  Negative for dryness.  Respiratory:  Negative for shortness of breath.   Cardiovascular:  Negative for chest pain and palpitations.  Gastrointestinal:  Positive for constipation. Negative for blood in stool and diarrhea.  Endocrine: Negative for increased urination.  Genitourinary:  Negative for involuntary urination.  Musculoskeletal:  Positive for joint pain, joint pain and morning stiffness. Negative for gait problem, joint swelling, myalgias, muscle weakness, muscle tenderness and myalgias.  Skin:  Positive for color change, rash and sensitivity to sunlight. Negative for hair loss.  Allergic/Immunologic: Negative for  susceptible to infections.  Neurological:  Negative for dizziness and headaches.  Hematological:  Negative for swollen glands.  Psychiatric/Behavioral:  Positive for sleep disturbance. Negative for depressed mood.     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 cause 06/18/2020    Past Medical History:  Diagnosis Date   Alcoholism ([place])    Anemia    Gallstones    History of chicken pox    Hyperpigmentation    Longitudinal melanonychia    Nevus    right dorsal wrist   Positive ANA (antinuclear antibody)    Primary sclerosing cholangitis    Pruritus    Shingles    Shingles 07/18/2020   Vaginal delivery 2003    Family History  Problem Relation Age of Onset   Mental illness Mother        paranoid schizophrenia   Hyperlipidemia Mother    Alcohol abuse Father    Colon polyps Father    Hypertension Father    Heart murmur Brother    Cancer Maternal Grandmother  uncertain type   Healthy Daughter    Asthma Daughter    Learning disabilities Daughter    Healthy Son    Diabetes Paternal Aunt    Past Surgical History:  Procedure Laterality Date   CESAREAN SECTION  08/08/2006   CHOLECYSTECTOMY  2003   Social History   Social History Narrative   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, [name]   1 son born 2003 1 daughter born 2008   Alcohol use regular for 10+ years stopped 01/18/2020 was 2 to 3 glasses of wine daily   No drug use   Coffee and tea 2 or 3 a day for caffeine   Immunization History  Administered Date(s) Administered   Hep A /  Hep B 09/08/2020, 10/12/2020   Hepatitis A, Adult 03/12/2021   Hepatitis B, ped/adol 03/12/2021, 03/12/2021   Influenza,inj,Quad PF,6+ Mos 12/26/2018, 11/29/2019, 12/03/2020   Influenza,inj,quad, With Preservative 11/29/2019   PFIZER(Purple Top)SARS-COV-2 Vaccination 05/02/2019, 05/23/2019, 01/03/2020, 10/06/2020   Tdap 05/10/2006, 11/29/2019     Objective: Vital Signs: BP 115/64 (BP Location: Right Arm, Patient Position: Sitting, Cuff Size: Normal)   Pulse 83   Resp 15   Ht 5' 0.75" (1.543 m)   Wt 139 lb 6.4 oz (63.2 kg)   LMP 10/01/2021 (Exact Date)   BMI 26.56 kg/m    Physical Exam HENT:     Mouth/Throat:     Mouth: Mucous membranes are moist.     Pharynx: Oropharynx is clear.  Cardiovascular:     Rate and Rhythm: Normal rate and regular rhythm.  Pulmonary:     Effort: Pulmonary effort is normal.     Breath sounds: Normal breath sounds.  Musculoskeletal:     Right lower leg: No edema.     Left lower leg: No edema.  Skin:    General: Skin is warm and dry.     Comments: Nonspecific nailfold capillary changes on both hands Several 1 to 2 mm diameter hyperpigmented macules on palms and fingertips  Neurological:     Mental Status: She is alert.  Psychiatric:        Mood and Affect: Mood normal.      Musculoskeletal Exam:  Shoulders full ROM no tenderness or swelling Elbows full ROM no tenderness or swelling Wrists full ROM no tenderness or swelling Fingers full ROM no tenderness or swelling Knees full ROM no tenderness or swelling Ankles full ROM no tenderness or swelling  Investigation: No additional findings.  Imaging: No results found.  Recent Labs: Lab Results  Component Value Date   WBC 5.2 12/03/2020   HGB 11.7 (L) 12/03/2020   PLT 242.0 12/03/2020   NA 136 06/03/2021   K 4.2 06/03/2021   CL 104 06/03/2021   CO2 23 06/03/2021   GLUCOSE 81 06/03/2021   BUN 15 06/03/2021   CREATININE 0.89 06/03/2021   BILITOT 0.4 12/03/2020   ALKPHOS 100  12/03/2020   AST 26 12/03/2020   ALT 22 12/03/2020   PROT 6.8 05/25/2021   ALBUMIN 4.3 12/03/2020   CALCIUM 9.2 06/03/2021   GFRAA 95 08/24/2016   QFTBGOLDPLUS NEGATIVE 07/23/2020    Speciality Comments: PLQ Eye Exam normal 09/17/2021 Jomarie Longs, OD f/u 6 months  Procedures:  No procedures performed Allergies: Patient has no known allergies.   Assessment / Plan:     Visit Diagnoses: Raynaud's syndrome without gangrene  Symptoms are currently slightly better than months earlier probably due to warm weather.  Not bothering her enough  to want to take additional medications at this time and no evidence of damage on exam today.  I recommend we can just monitor for now plan to follow-up in a few months.  Arthralgia, unspecified joint  No inflammatory changes on exam today and not in any particular exacerbation.  Positive ANA (antinuclear antibody)  Monitoring for now, suspicion is for relation to [facility].  Hyperpigmentation of skin  There is not appear to be any significant difference from when she was taking the hydroxychloroquine and after stopping this.  So probably not contributory.  Evaluation with Marella Chimes reassuring against any malignancy process but not clear about relationship to other systemic problems.  Orders: No orders of the defined types were placed in this encounter.  No orders of the defined types were placed in this encounter.    Follow-Up Instructions: Return in about 4 months (around 02/25/2022) for UCTD obs f/u 3-4 mos.   Collier Salina, MD  Note - This record has been created using [employer].  Chart creation errors have been sought, but may not always  have been located. Such creation errors do not reflect on  the standard of medical care.

## 2021-10-26 ENCOUNTER — Encounter: Payer: Self-pay | Admitting: Internal Medicine

## 2021-10-26 ENCOUNTER — Ambulatory Visit: Payer: Managed Care, Other (non HMO) | Attending: Internal Medicine | Admitting: Internal Medicine

## 2021-10-26 VITALS — BP 115/64 | HR 83 | Resp 15 | Ht 60.75 in | Wt 139.4 lb

## 2021-10-26 DIAGNOSIS — M2550 Pain in unspecified joint: Secondary | ICD-10-CM

## 2021-10-26 DIAGNOSIS — I7300 Raynaud's syndrome without gangrene: Secondary | ICD-10-CM | POA: Diagnosis not present

## 2021-10-26 DIAGNOSIS — R768 Other specified abnormal immunological findings in serum: Secondary | ICD-10-CM

## 2021-10-26 DIAGNOSIS — E2740 Unspecified adrenocortical insufficiency: Secondary | ICD-10-CM

## 2021-10-26 DIAGNOSIS — L819 Disorder of pigmentation, unspecified: Secondary | ICD-10-CM

## 2021-10-26 DIAGNOSIS — Z79899 Other long term (current) drug therapy: Secondary | ICD-10-CM

## 2021-10-28 DIAGNOSIS — D270 Benign neoplasm of right ovary: Secondary | ICD-10-CM | POA: Insufficient documentation

## 2021-11-03 ENCOUNTER — Other Ambulatory Visit: Payer: Self-pay

## 2021-11-03 ENCOUNTER — Other Ambulatory Visit: Payer: Self-pay | Admitting: Genetic Counselor

## 2021-11-03 ENCOUNTER — Inpatient Hospital Stay: Payer: Managed Care, Other (non HMO)

## 2021-11-03 ENCOUNTER — Inpatient Hospital Stay: Payer: Managed Care, Other (non HMO) | Attending: Genetic Counselor | Admitting: Genetic Counselor

## 2021-11-03 DIAGNOSIS — Z8042 Family history of malignant neoplasm of prostate: Secondary | ICD-10-CM

## 2021-11-03 DIAGNOSIS — Z8041 Family history of malignant neoplasm of ovary: Secondary | ICD-10-CM

## 2021-11-03 DIAGNOSIS — L819 Disorder of pigmentation, unspecified: Secondary | ICD-10-CM | POA: Diagnosis not present

## 2021-11-03 LAB — GENETIC SCREENING ORDER

## 2021-11-04 ENCOUNTER — Encounter: Payer: Self-pay | Admitting: Genetic Counselor

## 2021-11-04 ENCOUNTER — Other Ambulatory Visit: Payer: Self-pay | Admitting: Obstetrics and Gynecology

## 2021-11-04 DIAGNOSIS — Z8042 Family history of malignant neoplasm of prostate: Secondary | ICD-10-CM | POA: Insufficient documentation

## 2021-11-04 DIAGNOSIS — Z8041 Family history of malignant neoplasm of ovary: Secondary | ICD-10-CM | POA: Insufficient documentation

## 2021-11-04 NOTE — Progress Notes (Addendum)
REFERRING PROVIDER: Gatha Mayer, MD [address],  [place] [address]  PRIMARY PROVIDER:  Inda Coke, PA  PRIMARY REASON FOR VISIT:  1. Family history of prostate cancer   2. Family history of ovarian cancer   3. Hyperpigmentation of skin      HISTORY OF PRESENT ILLNESS:   Ms. Torpey, a 41 y.o. female, was seen for a [place] cancer genetics consultation at the request of Dr. Carlean Purl due to a personal history of hyperpigmentation and concern for Peutz-Jaeger Syndrome (PJS) and a family history of cancer.  Ms. Simkin presents to clinic today to discuss the possibility of a hereditary predisposition to cancer, genetic testing, and to further clarify her future cancer risks, as well as potential cancer risks for family members.   Ms. Pala is a 41 y.o. female with no personal history of cancer.  She has had an onset of hyperpigmentation on her fingers, nails, lips and oral mucosa starting in February 2023.  She was seen by dermatology who is concerned for either PS or Laugier-Hunziker syndrome.  CANCER HISTORY:  Oncology History   No history exists.     RISK FACTORS:  Menarche was at age 45.  First live birth at age 13.  OCP use for approximately 0 years.  Ovaries intact: yes.  Hysterectomy: no.  Menopausal status: premenopausal.  HRT use: 0 years. Colonoscopy: yes; normal. Mammogram within the last year: no. Number of breast biopsies: 0. Up to date with pelvic exams: yes. Any excessive radiation exposure in the past: no  Past Medical History:  Diagnosis Date   Alcoholism ([place])    Anemia    Family history of ovarian cancer    Family history of prostate cancer    Gallstones    History of chicken pox    Hyperpigmentation    Longitudinal melanonychia    Nevus    right dorsal wrist   Positive ANA (antinuclear antibody)    Primary sclerosing cholangitis    Pruritus    Shingles    Shingles 07/18/2020   Vaginal delivery 2003    Past Surgical  History:  Procedure Laterality Date   CESAREAN SECTION  08/08/2006   CHOLECYSTECTOMY  2003    Social History   Socioeconomic History   Marital status: Married    Spouse name: Merrilee Seashore   Number of children: 2   Years of education: Not on file   Highest education level: Bachelor's degree (e.g., BA, AB, BS)  Occupational History   Occupation: Field seismologist  Tobacco Use   Smoking status: Never   Smokeless tobacco: Never  Vaping Use   Vaping Use: Never used  Substance and Sexual Activity   Alcohol use: Not Currently    Alcohol/week: 3.0 standard drinks of alcohol    Types: 3 Glasses of wine per week   Drug use: No   Sexual activity: Yes    Birth control/protection: None    Comment: My husband cannot have children  Other Topics Concern   Not on file  Social History Narrative   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  Significant diagnoses are listed below: Family History  Problem Relation Age of Onset   Mental illness Mother        paranoid schizophrenia   Hyperlipidemia Mother    Alcohol abuse Father    Colon polyps Father    Hypertension Father    Prostate cancer Father 91   Heart murmur Brother    Diabetes Paternal Aunt    Cancer Maternal Grandmother        uncertain type   Healthy Daughter    Asthma Daughter    Learning disabilities Daughter    Healthy Son    Ovarian cancer Other        PGF sister     The patient has a son and daughter  who are cancer free. She has a brother who reportedly had a cyst or mass in his breast, but she does not think it was cancerous.  Both parents are living.  The patient's father has a low grade prostate cancer diagnosed at 57.  He had two sisters, one who died at 17, who are cancer free.  His parents are deceased.  His father has a sister who died of ovarian cancer.  The patient's mother is living at 57.  She had one brother who died of possibly HIV.  Her parents are deceased.  Ms. Torelli is unaware of previous family history of genetic testing for hereditary cancer risks. Patient's maternal ancestors are of [place] American descent, and paternal ancestors are of [place] and [place] descent. There is reported Ashkenazi Jewish ancestry. There is no known consanguinity.  GENETIC COUNSELING ASSESSMENT: Ms. Mcglown is a 41 y.o. female with a family history of cancer and a personal history of hyperpigmentation which is somewhat suggestive of a hereditary cancer syndrome and predisposition to cancer given the combination of cancer and her Jewish ancestry. We, therefore, discussed and recommended the following at today's visit.   DISCUSSION: We discussed that, in general, most cancer is not inherited in families, but instead is sporadic or familial. Sporadic cancers occur by chance and typically happen at older ages (>50 years) as this type of cancer is caused by genetic changes acquired during an individual's lifetime. Some families have more cancers than would be expected by chance; however, the ages or types of cancer are not consistent with a known genetic mutation or known genetic mutations have been ruled out. This type of familial cancer is thought to be due to a combination of multiple genetic, environmental, hormonal, and lifestyle factors. While this combination of factors likely increases the risk of cancer, the exact source of this risk is not currently identifiable or testable.  We discussed that  up to 20% of ovarian cancer is hereditary, with most cases associated with BRCA mutations.  In the [place] population, there is a significant increased risk for one of three founder mutations in BRCA1 and BRCA2 when there is a history of breast or ovarian cancer.  There are other genes that can be associated with hereditary ovarian cancer syndromes.  These include BRIP1, RAD51C, RAD51D and Lynch syndrome.  There is also a founder mutation in [place] in the [place] population that should also be tested for.  This could be the explanation of her father's colon polyps.  The patient has  experienced progressive hyperpigmentation of her lips, oral mucosa, nails, and fingers over the last 9 months.  She has seen several specialists for this, including her rheumatologist, a dermatologist and gastroenterologist.  She had a colonoscopy that was normal, and has been taken off medication that can be associated with skin darkening.  Her dermatologist is concerned for PJS as well as Laugier-Hunziker Syndrome, which is a diagnosis of exclusion.  We discussed that PJS is associated with an increased risk for certain cancers, including breast cancer and a specific type of colon polyp.  Additionally, the classic form of PJS is associated with hyperpigmentation that becomes lighter over time.  Therefore the likelihood of testing is positive for PJS and a STK11 mutation is low.  The patient voiced her understanding as well.    We discussed that some people do not want to undergo genetic testing due to fear of genetic discrimination.  A federal law called the Genetic Information Non-Discrimination Act (GINA) of 2008 helps protect individuals against genetic discrimination based on their genetic test results.  It impacts both health insurance and employment.  With health insurance, it protects against increased premiums, being kicked off insurance or being forced to take a test in order to be insured.  For employment it protects  against hiring, firing and promoting decisions based on genetic test results.  GINA does not apply to those in the [employer], those who work for companies with less than 15 employees, and new life insurance or long-term disability insurance policies.  Health status due to a cancer diagnosis is not protected under GINA.   We reviewed the characteristics, features and inheritance patterns of hereditary cancer syndromes. We also discussed genetic testing, including the appropriate family members to test, the process of testing, insurance coverage and turn-around-time for results. We discussed the implications of a negative, positive, carrier and/or variant of uncertain significant result. Ms. Heater  was offered a common hereditary cancer panel (45-47 genes) and an expanded pan-cancer panel (77-84 genes). Ms. Weimann was informed of the benefits and limitations of each panel, including that expanded pan-cancer panels contain genes that do not have clear management guidelines at this point in time.  We also discussed that as the number of genes included on a panel increases, the chances of variants of uncertain significance increases. Ms. Bezdek decided to pursue genetic testing for the CancerNext-Expanded+RNAinsight gene panel.   The CancerNext-Expanded gene panel offered by [facility] and includes sequencing and rearrangement analysis for the following 77 genes: AIP, ALK, APC*, ATM*, AXIN2, BAP1, BARD1, BLM, BMPR1A, BRCA1*, BRCA2*, BRIP1*, CDC73, CDH1*, CDK4, CDKN1B, CDKN2A, CHEK2*, CTNNA1, DICER1, FANCC, FH, FLCN, GALNT12, KIF1B, LZTR1, MAX, MEN1, MET, MLH1*, MSH2*, MSH3, MSH6*, MUTYH*, NBN, NF1*, NF2, NTHL1, PALB2*, PHOX2B, PMS2*, POT1, PRKAR1A, PTCH1, PTEN*, RAD51C*, RAD51D*, RB1, RECQL, RET, SDHA, SDHAF2, SDHB, SDHC, SDHD, SMAD4, SMARCA4, SMARCB1, SMARCE1, STK11, SUFU, TMEM127, TP53*, TSC1, TSC2, VHL and XRCC2 (sequencing and deletion/duplication); EGFR, EGLN1, HOXB13, KIT, MITF, PDGFRA, POLD1, and POLE  (sequencing only); EPCAM and GREM1 (deletion/duplication only). DNA and RNA analyses performed for * genes.   Based on Ms. Butkiewicz family history of cancer and her Ashkenazi Jewish ancestry, she meets medical criteria for genetic testing. Despite that she meets criteria, she may still have an out of pocket cost. We discussed that if her out of pocket cost for testing is over $100, the laboratory will call and confirm whether she wants to proceed with testing.  If the out of pocket cost of testing is less than $  100 she will be billed by the genetic testing laboratory.   PLAN: After considering the risks, benefits, and limitations, Ms. Eastburn provided informed consent to pursue genetic testing and the blood sample was sent to [facility] for analysis of the CancerNext-Expanded+RNAinsight. Results should be available within approximately 2-3 weeks' time, at which point they will be disclosed by telephone to Ms. Slagel, as will any additional recommendations warranted by these results. Ms. Decaprio will receive a summary of her genetic counseling visit and a copy of her results once available. This information will also be available in Epic.   Lastly, we encouraged Ms. Balash to remain in contact with cancer genetics annually so that we can continuously update the family history and inform her of any changes in cancer genetics and testing that may be of benefit for this family.   Ms. Ribera questions were answered to her satisfaction today. Our contact information was provided should additional questions or concerns arise. Thank you for the referral and allowing [place] to share in the care of your patient.   Olly Shiner P. Florene Glen, [place], [facility] Licensed, Insurance risk surveyor [email or web address] phone: [phone]  The patient was seen for a total of 45 minutes in face-to-face genetic counseling.  The patient was seen alone.  Drs. Michell Heinrich, and/or [place] were available for  questions, if needed..    _______________________________________________________________________ For Office Staff:  Number of people involved in session: 1 Was an Intern/ student involved with case: yes Carloyn Manner

## 2021-11-22 ENCOUNTER — Encounter: Payer: Self-pay | Admitting: *Deleted

## 2021-11-22 ENCOUNTER — Ambulatory Visit
Admission: RE | Admit: 2021-11-22 | Discharge: 2021-11-22 | Disposition: A | Discharge status: Home / Self Care | Payer: Managed Care, Other (non HMO) | Source: Ambulatory Visit | Attending: Physician Assistant | Admitting: Physician Assistant

## 2021-11-22 DIAGNOSIS — Z1231 Encounter for screening mammogram for malignant neoplasm of breast: Secondary | ICD-10-CM | POA: Diagnosis present

## 2021-11-22 NOTE — H&P (Signed)
Preoperative History and Physical   Alicia Carr is a 41 y.o. [id number] here for surgical management of dysmenorrhea and a right dermoid cyst.   No significant preoperative concerns.   History of Present Illness: 41 y.o. G60P2002 female who presents to follow up from results from the above.   On an abdominal ultrasound a shadow was noted on her left kidney. A CT was ordered for the kidney and a CT showed an enlarged uterus.  She occasionally has left lower quadrant pressure with her menses.  However she has noticed no other concerning symptoms.  She states that she has a rheumatologic condition and a liver condition and she wonders if these are tied with the findings on her right ovary.  She is quite unclear about the meaning of the findings on the ultrasound and the MRI.   Pelvic ultrasound 10/28/2021: Ultrasound demonstrates the following findings Adnexa: right ovary with 2.5 cm dermoid cyst  Uterus: anteverted with endometrial stripe  13.0 mm Additional: No additional isses   Ultrasound impression (from report on 03/18/2020) IMPRESSION:  1. Uterus is enlarged heterogeneous. No fibroid or other mass  visualized. (measures 12.4 x 4.5 x 5.5 cm) 2. Echogenic lesion mass of the right ovary measuring up to 1.7 cm,  likely a dermoid cyst, although there is questionable internal  vascularity. Recommend pelvic MRI for further evaluation.    MRI Impression (from report on 03/19/2021): IMPRESSION:  1. There is a 1.5 x 1.4 cm macroscopic fat containing dermoid of the  right ovary, in keeping with findings of prior ultrasound. No  further follow-up or characterization is required for this benign  lesion.  2. The ovaries are otherwise normal with numerous small functional  follicles.    Last pap smear: 11/29/2019 - NILM, HPV negative   After discussion of the slow growth of her dermoid and her dysmenorrhea, she would like to have the dermoid removed and have an assessment for endometriosis.     Proposed surgery: robot assisted laparoscopic right ovarian dermoid cyst removal and assessment for endometriosis with removal of implants, if found.        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  I reviewed with the patient that this is consistent with very early scarring in the liver. It is possible that she may have progressive scarring over time given PSC. I would recommend restaging fibrosis in 2 years with FibroScan.   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          Current Outpatient Medications on File Prior to Visit  Medication Sig Dispense Refill   cholecalciferol (VITAMIN D3) 5,000 unit capsule Take 1 capsule (5,000 Units total) by mouth once daily       clindamycin (CLINDAGEL) 1 % topical gel Apply topically as needed       ferrous sulfate 325 (65 FE) MG EC tablet Take 1 tablet (325 mg total) by mouth daily with breakfast       loteprednol (LOTEMAX) 0.5 % ophthalmic suspension         tretinoin  (RETIN-A) 2.70 % cream 1 application in the evening to face       amLODIPine (NORVASC) 5 MG tablet  (Patient not taking: Reported on 10/28/2021)       ergocalciferol, vitamin D2, 1,250 mcg (50,000 unit) capsule Take 1 capsule (50,000 Units total) by mouth every 7 (seven) days (Patient not taking: Reported on 02/08/2021)       prednisoLONE acetate (PRED FORTE) 1 % ophthalmic suspension  (Patient not taking: Reported on 02/08/2021)        No current facility-administered medications on file prior to visit.    No Known Allergies   Social History:   reports that she has never smoked. She has never used smokeless tobacco. She reports current alcohol use. She reports that she does not use drugs.        Family History  Problem Relation Age of Onset   Hyperlipidemia (Elevated cholesterol) Mother     Paranoid behavior Mother     Schizophrenia Mother     High blood pressure (Hypertension) Father     Alcohol abuse Father     Colon polyps Father     Heart murmur Brother     Cancer Maternal Grandmother          unknown type   Learning disabilities Daughter     Asthma Daughter     Autism Daughter     ADD / ADHD Daughter     No Known Problems Daughter     No Known Problems Son     Diabetes Paternal Aunt        Review of Systems: Noncontributory   PHYSICAL EXAM: Blood pressure 108/70, pulse 92, weight 64.4 kg (142 lb), last menstrual period 11/01/2021. CONSTITUTIONAL: Well-developed, well-nourished female in no acute distress.  HENT:  Normocephalic, atraumatic, External right and left ear normal. Oropharynx is clear and moist EYES: Conjunctivae and EOM are normal. Pupils are equal, round, and reactive to light. No scleral icterus.  NECK: Normal range of motion, supple, no masses SKIN: Skin is warm and dry. No rash noted. Not diaphoretic. No erythema. No pallor. [place]: Alert and oriented to person, place, and time. Normal reflexes, muscle tone coordination. No cranial nerve deficit  noted. PSYCHIATRIC: Normal mood and affect. Normal behavior. Normal judgment and thought content. CARDIOVASCULAR: Normal heart rate noted, regular rhythm RESPIRATORY: Effort and breath sounds normal, no problems with respiration noted ABDOMEN: Soft, nontender, nondistended. PELVIC: Deferred MUSCULOSKELETAL: Normal range of motion. No edema and no tenderness. 2+ distal pulses.   Labs: Recent Results  No results found for  this or any previous visit (from the past 336 hour(s)).     Imaging Studies: US pelvic transvaginal   Result Date: 10/28/2021 ULTRASOUND REPORT Location: [facility] Date of Service: 10/28/2021 Indications: Follow up right ovarian dermoid cyst Findings: The uterus is anteverted and measures 9.9 x 5.5 x 6.1 cm. Echo texture is homogenous without evidence of focal masses. The Endometrium measures 13 mm. Right Ovary measures 3.4 x 2.65 x 2.3 cm. There is a 2.45 x 1.8 x 1.9 cm hyperechoic lesion at the margin of the ovary. This is noted to be increased since her last ultrasound where the lesion measured 1.7 cm in the largest dimension. Left Ovary measures 3 x 2.6 x 1.8 cm. It is normal in appearance. Survey of the adnexa demonstrates no adnexal masses. There is no free fluid in the cul de sac. Impression: 1. Slight interval increase in size of right ovarian dermoid cyst. 2. Otherwise, normal appearing pelvic ultrasound The ultrasound images and findings were reviewed by me and I agree with the above report. Clare Charon, MD [place] 10/28/2021 5:16 PM         Assessment: 1. Dermoid cyst of right ovary   2. Dysmenorrhea       Plan: Patient will undergo surgical management with the above noted surgery.   The risks of surgery were discussed in detail with the patient including but not limited to: bleeding which may require transfusion or reoperation; infection which may require antibiotics; injury to surrounding organs which may  involve bowel, bladder, ureters ; need for additional procedures including laparoscopy or laparotomy; thromboembolic phenomenon, surgical site problems and other postoperative/anesthesia complications. Likelihood of success in alleviating the patient's condition was discussed. Routine postoperative instructions will be reviewed with the patient and her family in detail after surgery.  The patient concurred with the proposed plan, giving informed written consent for the surgery. Preoperative prophylactic antibiotics, as indicated, and SCDs ordered on call to the OR.       Attestation Statement:    I personally performed the service. (TP)   [place], MD  [place] 11/22/2021 9:09 AM

## 2021-11-24 ENCOUNTER — Encounter
Admission: RE | Admit: 2021-11-24 | Discharge: 2021-11-24 | Disposition: A | Discharge status: Home / Self Care | Payer: Managed Care, Other (non HMO) | Source: Ambulatory Visit | Attending: Obstetrics and Gynecology | Admitting: Obstetrics and Gynecology

## 2021-11-24 ENCOUNTER — Encounter: Payer: Self-pay | Admitting: Obstetrics and Gynecology

## 2021-11-24 VITALS — Ht 60.75 in | Wt 139.0 lb

## 2021-11-24 DIAGNOSIS — K8301 Primary sclerosing cholangitis: Secondary | ICD-10-CM

## 2021-11-24 DIAGNOSIS — I7300 Raynaud's syndrome without gangrene: Secondary | ICD-10-CM

## 2021-11-24 DIAGNOSIS — Z01812 Encounter for preprocedural laboratory examination: Secondary | ICD-10-CM

## 2021-11-24 DIAGNOSIS — R768 Other specified abnormal immunological findings in serum: Secondary | ICD-10-CM

## 2021-11-24 DIAGNOSIS — K7401 Hepatic fibrosis, early fibrosis: Secondary | ICD-10-CM

## 2021-11-24 HISTORY — DX: Hepatic fibrosis, early fibrosis: K74.01

## 2021-11-24 HISTORY — DX: Raynaud's syndrome without gangrene: I73.00

## 2021-11-24 NOTE — Patient Instructions (Addendum)
Your procedure is scheduled on: Friday, October 6 Report to the Registration Desk on the 1st floor of the [facility]. To find out your arrival time, please call [phone] between 1PM - 3PM on: Thursday, October 5 If your arrival time is 6:00 am, do not arrive prior to that time as the [facility] entrance doors do not open until 6:00 am.  REMEMBER: Instructions that are not followed completely may result in serious medical risk, up to and including death; or upon the discretion of your surgeon and anesthesiologist your surgery may need to be rescheduled.  Do not eat food after midnight the night before surgery.  No gum chewing, lozengers or hard candies.  You may however, drink CLEAR liquids up to 2 hours before you are scheduled to arrive for your surgery. Do not drink anything within 2 hours of your scheduled arrival time.  Clear liquids include: - water  - apple juice without pulp - gatorade (not RED colors) - black coffee or tea (Do NOT add milk or creamers to the coffee or tea) Do NOT drink anything that is not on this list.  In addition, your doctor has ordered for you to drink the provided  Ensure Pre-Surgery Clear Carbohydrate Drink  Drinking this carbohydrate drink up to two hours before surgery helps to reduce insulin resistance and improve patient outcomes. Please complete drinking 2 hours prior to scheduled arrival time.  TAKE THESE MEDICATIONS THE MORNING OF SURGERY:  LOTEPREDNOL EYE DROP  One week prior to surgery: starting September 29 Stop Anti-inflammatories (NSAIDS) such as Advil, Aleve, Ibuprofen, Motrin, Naproxen, Naprosyn and Aspirin based products such as Excedrin, Goodys Powder, BC Powder. Stop ANY OVER THE COUNTER supplements until after surgery. You may however, continue to take Tylenol if needed for pain up until the day of surgery.  No Alcohol for 24 hours before or after surgery.  No Smoking including e-cigarettes for 24 hours prior to surgery.   No chewable tobacco products for at least 6 hours prior to surgery.  No nicotine patches on the day of surgery.  Do not use any "recreational" drugs for at least a week prior to your surgery.  Please be advised that the combination of cocaine and anesthesia may have negative outcomes, up to and including death. If you test positive for cocaine, your surgery will be cancelled.  On the morning of surgery brush your teeth with toothpaste and water, you may rinse your mouth with mouthwash if you wish. Do not swallow any toothpaste or mouthwash.  Use CHG Soap as directed on instruction sheet.  Do not wear jewelry, make-up, hairpins, clips or nail polish.  Do not wear lotions, powders, or perfumes.   Do not shave body from the neck down 48 hours prior to surgery just in case you cut yourself which could leave a site for infection.  Also, freshly shaved skin may become irritated if using the CHG soap.  Contact lenses, hearing aids and dentures may not be worn into surgery.  Do not bring valuables to the hospital. [facility] is not responsible for any missing/lost belongings or valuables.   Notify your doctor if there is any change in your medical condition (cold, fever, infection).  Wear comfortable clothing (specific to your surgery type) to the hospital.  After surgery, you can help prevent lung complications by doing breathing exercises.  Take deep breaths and cough every 1-2 hours. Your doctor may order a device called an Incentive Spirometer to help you take deep  breaths. When coughing or sneezing, hold a pillow firmly against your incision with both hands. This is called "splinting." Doing this helps protect your incision. It also decreases belly discomfort.  If you are being discharged the day of surgery, you will not be allowed to drive home. You will need a responsible adult (18 years or older) to drive you home and stay with you that night.   If you are taking public  transportation, you will need to have a responsible adult (18 years or older) with you. Please confirm with your physician that it is acceptable to use public transportation.   Please call the [facility] Dept. at [phone] if you have any questions about these instructions.  Surgery Visitation Policy:  Patients undergoing a surgery or procedure may have two family members or support persons with them as long as the person is not COVID-19 positive or experiencing its symptoms.      Preparing for Surgery with CHLORHEXIDINE GLUCONATE (CHG) Soap  Chlorhexidine Gluconate (CHG) Soap  o An antiseptic cleaner that kills germs and bonds with the skin to continue killing germs even after washing  o Used for showering the night before surgery and morning of surgery  Before surgery, you can play an important role by reducing the number of germs on your skin.  CHG (Chlorhexidine gluconate) soap is an antiseptic cleanser which kills germs and bonds with the skin to continue killing germs even after washing.  Please do not use if you have an allergy to CHG or antibacterial soaps. If your skin becomes reddened/irritated stop using the CHG.  1. Shower the NIGHT BEFORE SURGERY and the MORNING OF SURGERY with CHG soap.  2. If you choose to wash your hair, wash your hair first as usual with your normal shampoo.  3. After shampooing, rinse your hair and body thoroughly to remove the shampoo.  4. Use CHG as you would any other liquid soap. You can apply CHG directly to the skin and wash gently with a scrungie or a clean washcloth.  5. Apply the CHG soap to your body only from the neck down. Do not use on open wounds or open sores. Avoid contact with your eyes, ears, mouth, and genitals (private parts). Wash face and genitals (private parts) with your normal soap.  6. Wash thoroughly, paying special attention to the area where your surgery will be performed.  7. Thoroughly rinse your  body with warm water.  8. Do not shower/wash with your normal soap after using and rinsing off the CHG soap.  9. Pat yourself dry with a clean towel.  10. Wear clean pajamas to bed the night before surgery.  12. Place clean sheets on your bed the night of your first shower and do not sleep with pets.  13. Shower again with the CHG soap on the day of surgery prior to arriving at the hospital.  14. Do not apply any deodorants/lotions/powders.  15. Please wear clean clothes to the hospital.

## 2021-11-25 ENCOUNTER — Encounter: Payer: Self-pay | Admitting: Genetic Counselor

## 2021-11-25 ENCOUNTER — Telehealth: Payer: Self-pay | Admitting: Genetic Counselor

## 2021-11-25 NOTE — Telephone Encounter (Signed)
Patient called to see when results may be back.  I called back but her VM was full.  Sent a [employer].

## 2021-11-26 ENCOUNTER — Encounter
Admission: RE | Admit: 2021-11-26 | Discharge: 2021-11-26 | Disposition: A | Discharge status: Home / Self Care | Payer: Managed Care, Other (non HMO) | Source: Ambulatory Visit | Attending: Obstetrics and Gynecology | Admitting: Obstetrics and Gynecology

## 2021-11-26 ENCOUNTER — Encounter: Payer: Self-pay | Admitting: Urgent Care

## 2021-11-26 DIAGNOSIS — R768 Other specified abnormal immunological findings in serum: Secondary | ICD-10-CM | POA: Diagnosis not present

## 2021-11-26 DIAGNOSIS — G8929 Other chronic pain: Secondary | ICD-10-CM | POA: Insufficient documentation

## 2021-11-26 DIAGNOSIS — D270 Benign neoplasm of right ovary: Secondary | ICD-10-CM | POA: Insufficient documentation

## 2021-11-26 DIAGNOSIS — I7300 Raynaud's syndrome without gangrene: Secondary | ICD-10-CM | POA: Diagnosis not present

## 2021-11-26 DIAGNOSIS — K7401 Hepatic fibrosis, early fibrosis: Secondary | ICD-10-CM | POA: Diagnosis not present

## 2021-11-26 DIAGNOSIS — K8301 Primary sclerosing cholangitis: Secondary | ICD-10-CM | POA: Diagnosis not present

## 2021-11-26 DIAGNOSIS — Z01812 Encounter for preprocedural laboratory examination: Secondary | ICD-10-CM | POA: Diagnosis not present

## 2021-11-26 DIAGNOSIS — R102 Pelvic and perineal pain: Secondary | ICD-10-CM | POA: Insufficient documentation

## 2021-11-26 LAB — BASIC METABOLIC PANEL
Anion gap: 7 (ref 5–15)
BUN: 21 mg/dL — ABNORMAL HIGH (ref 6–20)
CO2: 25 mmol/L (ref 22–32)
Calcium: 9 mg/dL (ref 8.9–10.3)
Chloride: 107 mmol/L (ref 98–111)
Creatinine, Ser: 0.74 mg/dL (ref 0.44–1.00)
GFR, Estimated: >60 mL/min (ref >60)
Glucose, Bld: 89 mg/dL (ref 70–99)
Potassium: 4.3 mmol/L (ref 3.5–5.1)
Sodium: 139 mmol/L (ref 135–145)

## 2021-11-26 LAB — CBC
HCT: 36.7 % (ref 36.0–46.0)
Hemoglobin: 11.4 g/dL — ABNORMAL LOW (ref 12.0–15.0)
MCH: 26.1 pg (ref 26.0–34.0)
MCHC: 31.1 g/dL (ref 30.0–36.0)
MCV: 84.2 fL (ref 80.0–100.0)
Platelets: 260 10*3/uL (ref 150–400)
RBC: 4.36 MIL/uL (ref 3.87–5.11)
RDW: 14.8 % (ref 11.5–15.5)
WBC: 8.3 10*3/uL (ref 4.0–10.5)
nRBC: 0 % (ref 0.0–0.2)

## 2021-11-26 LAB — TYPE AND SCREEN
ABO/RH(D): O POS
Antibody Screen: NEGATIVE

## 2021-11-29 ENCOUNTER — Encounter: Payer: Self-pay | Admitting: Genetic Counselor

## 2021-11-29 ENCOUNTER — Telehealth: Payer: Self-pay | Admitting: Genetic Counselor

## 2021-11-29 DIAGNOSIS — Z1379 Encounter for other screening for genetic and chromosomal anomalies: Secondary | ICD-10-CM

## 2021-11-29 HISTORY — DX: Encounter for other screening for genetic and chromosomal anomalies: Z13.79

## 2021-11-29 NOTE — Telephone Encounter (Signed)
LM on VM that results are back and to please give me a call.  Left CB instructions.

## 2021-11-30 ENCOUNTER — Ambulatory Visit: Payer: Self-pay | Admitting: Genetic Counselor

## 2021-11-30 ENCOUNTER — Encounter: Payer: Self-pay | Admitting: Internal Medicine

## 2021-11-30 ENCOUNTER — Encounter: Payer: Self-pay | Admitting: Physician Assistant

## 2021-11-30 DIAGNOSIS — Z1589 Genetic susceptibility to other disease: Secondary | ICD-10-CM | POA: Insufficient documentation

## 2021-11-30 DIAGNOSIS — Z1379 Encounter for other screening for genetic and chromosomal anomalies: Secondary | ICD-10-CM

## 2021-11-30 NOTE — Telephone Encounter (Signed)
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  This increases the risk for both breast and ovarian cancer in women.  Discussed NCCN recommendations and a referral to the high risk clinic.  Patient states she would like this.  Patient has ovarian cyst removal surgery on 10/6.  We discussed letting her MD know that she has a mutation that increases her risk for ovarian cancer so they can make a plan on whether to go ahead with the planned surgery or possibly schedule for additional surgery.

## 2021-11-30 NOTE — Progress Notes (Addendum)
REFERRING PROVIDER: No referring provider defined for this encounter.  PRIMARY PROVIDER:  Inda Coke, PA  PRIMARY REASON FOR VISIT:  1. Genetic testing   2. Monoallelic mutation of [id number] gene     HPI:  [name] was previously seen in the [facility] clinic due to a family of cancer and concerns regarding a hereditary predisposition to cancer. Please refer to our prior cancer genetics clinic note for more information regarding [name] medical, social and family histories, and our assessment and recommendations, at the time. [name] recent genetic test results were disclosed to her, as were recommendations warranted by these results. These results and recommendations are discussed in more detail below.  CANCER HISTORY:  Oncology History   No history exists.    FAMILY HISTORY:  We obtained a detailed, 4-generation family history.  Significant diagnoses are listed below: Family History  Problem Relation Age of Onset   Mental illness Mother        paranoid schizophrenia   Hyperlipidemia Mother    Alcohol abuse Father    Colon polyps Father    Hypertension Father    Prostate cancer Father 62   Healthy Daughter    Asthma Daughter    Learning disabilities Daughter    Diabetes Paternal Aunt    Cancer Maternal Grandmother        uncertain type   Heart murmur Brother    Healthy Son    Ovarian cancer Other        PGF sister   Breast cancer Neg Hx        The patient has a son and daughter who are cancer free. She has a brother who reportedly had a cyst or mass in his breast, but she does not think it was cancerous.  Both parents are living.   The patient's father has a low grade prostate cancer diagnosed at 15.  He had two sisters, one who died at 23, who are cancer free.  His parents are deceased.  His father has a sister who died of ovarian cancer.   The patient's mother is living at 47.  She had one brother who died of possibly HIV.  Her  parents are deceased.   [name] is unaware of previous family history of genetic testing for hereditary cancer risks. Patient's maternal ancestors are of [place] American descent, and paternal ancestors are of [place] and [place] descent. There is reported Ashkenazi Jewish ancestry. There is no known consanguinity.  GENETIC TESTING:  At the time of [name] visit, we recommended she pursue genetic testing based on her family history of prostate and ovarian cancer and Ashkenazi Jewish ancestry. 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 There were no deleterious mutations in AIP, ALK, APC*, ATM*, AXIN2, BAP1, BARD1, BLM, BMPR1A, BRCA1*, BRCA2*, BRIP1*, CDC73, CDH1*, CDK4, CDKN1B, CDKN2A, CHEK2*, CTNNA1, DICER1, FANCC, FH, FLCN, GALNT12, KIF1B, LZTR1, MAX, MEN1, MET, MLH1*, MSH2*, MSH3, MSH6*, MUTYH*, NBN, NF1*, NF2, NTHL1, PALB2*, PHOX2B, PMS2*, POT1, PRKAR1A, PTCH1, PTEN*, RAD51D*, RB1, RECQL, RET, SDHA, SDHAF2, SDHB, SDHC, SDHD, SMAD4, SMARCA4, SMARCB1, SMARCE1, STK11, SUFU, TMEM127, TP53*, TSC1, TSC2, VHL and XRCC2 (sequencing and deletion/duplication); EGFR, EGLN1, HOXB13, KIT, MITF, PDGFRA, POLD1, and POLE (sequencing only); EPCAM and GREM1 (deletion/duplication only). DNA and RNA analyses performed for * genes. Marland Kitchen     CLINICAL CONDITION Women who are carriers of a single pathogenic  RAD51C variant have an increased risk of ovarian cancer. Studies suggest this risk is approximately 10-15% (PMID: 17616073, 71062694, 85462703, 50093818, 29937169, 67893810, 17510258, 52778242, 35361443). There is also evidence of an association with RAD51C and breast cancer, increasing the risk to between 17-30%.  INHERITANCE: Hereditary predisposition to cancer due to pathogenic variants in the RAD51C gene has autosomal dominant  inheritance. This means that an individual with a pathogenic variant has a 50% chance of passing the condition on to their offspring. With this result, it is now possible to identify at-risk relatives who can pursue testing for this specific familial variant. Many cases are inherited from a parent, but some cases can occur spontaneously (i.e., an individual with a pathogenic variant has parents who do not have it).  Individuals with a single pathogenic variant in RAD51C are also carriers of autosomal recessive Fanconi anemia (PMID: 15400867). Fanconi anemia is characterized by bone marrow failure with variable additional anomalies that often include short stature, abnormal skin pigmentation, abnormal thumbs, malformations of the skeletal and central nervous systems, and developmental delay (PMID: 6195093, 26712458). Risks for leukemia and early-onset solid tumors are significantly elevated with this disorder (PMID: 09983382, 50539767, 34193790). For there to be a risk of Fanconi anemia in offspring, both parents would each have to have a pathogenic variant in RAD51C; in this case, the risk of having an affected child is 25%.  MANAGEMENT: [employer]  (NCCN) recommends the following based on pathogenic mutations in RAD51C:  Prophylactic salpingo-oophorectomy (surgical removal of the ovaries and fallopian tubes) after childbearing is complete. The current evidence is insufficient to make a firm recommendation as to the optimal age for this procedure. However, based on the current, limited evidence base, a discussion about surgery should be held around 23-73 years of age or earlier based on a specific family history of early-onset ovarian cancer (Artist. Genetic/Familial High-Risk Assessment: Breast and Ovarian. Version 2.2024). Additionally, NCCN recommends genetic counseling in regards to recurrence risks.  Annual mammogram and consider breast  MRI with and without contrast starting at age 52 years.The patient states that she just had a mammogram that was normal, but indicated she had dense breast tissue.  She said that she was cleared for a year.  We discussed that based on this result, we would recommend a breast MRI in approximately 6 months.   Referral to the [facility] at [facility].  The patient is in agreement with this and the referral will be placed.   An individual's cancer risk and medical management are not determined by genetic test results alone. Overall cancer risk assessment incorporates additional factors, including personal medical history, family history, and any available genetic information that may result in a personalized plan for cancer prevention and surveillance.  FAMILY MEMBERS; It is important that all of [name] relatives (both men and women) know of the presence of this gene mutation. Site-specific genetic testing can sort out who in the family is at risk and who is not.   [name] children have a 50% chance to have inherited this mutation. However, they are relatively young and this will not be of any consequence to them for several years. We do not test children because there is no risk to them until they are adults. We recommend they have genetic counseling and testing by the time they are in their early 20's.    [name] parents and brother have a 50% chance to have  inherited this mutation. We recommend they have genetic testing for this same mutation, as identifying the presence of this mutation would allow them to also take advantage of risk-reducing measures.   We encouraged [name] to remain in contact with [place] on an annual basis so we can update her personal and family histories, and let her know of advances in cancer genetics that may benefit the family. Our contact number was provided. [name] questions were answered to her satisfaction today, and she  knows she is welcome to call anytime with additional questions.   Roma Kayser, [place], [facility]  Licensed, Certified Genetic Counselor [email or web address] phone: [phone]

## 2021-12-02 ENCOUNTER — Telehealth: Payer: Self-pay | Admitting: Hematology and Oncology

## 2021-12-02 NOTE — Telephone Encounter (Signed)
Scheduled appt per 10/3 referral. Pt is aware of appt date and time. Pt is aware to arrive 15 mins prior to appt time and to bring and updated insurance card. Pt is aware of appt location.   

## 2021-12-03 ENCOUNTER — Ambulatory Visit: Payer: Managed Care, Other (non HMO) | Admitting: Urgent Care

## 2021-12-03 ENCOUNTER — Other Ambulatory Visit: Payer: Self-pay

## 2021-12-03 ENCOUNTER — Ambulatory Visit
Admission: RE | Admit: 2021-12-03 | Discharge: 2021-12-03 | Disposition: A | Discharge status: Home / Self Care | Payer: Managed Care, Other (non HMO) | Attending: Obstetrics and Gynecology | Admitting: Obstetrics and Gynecology

## 2021-12-03 ENCOUNTER — Encounter: Payer: Self-pay | Admitting: Obstetrics and Gynecology

## 2021-12-03 ENCOUNTER — Encounter
Admission: RE | Disposition: A | Discharge status: Home / Self Care | Payer: Self-pay | Source: Home / Self Care | Attending: Obstetrics and Gynecology

## 2021-12-03 DIAGNOSIS — N838 Other noninflammatory disorders of ovary, fallopian tube and broad ligament: Secondary | ICD-10-CM | POA: Insufficient documentation

## 2021-12-03 DIAGNOSIS — Z8041 Family history of malignant neoplasm of ovary: Secondary | ICD-10-CM

## 2021-12-03 DIAGNOSIS — Z1502 Genetic susceptibility to malignant neoplasm of ovary: Secondary | ICD-10-CM | POA: Insufficient documentation

## 2021-12-03 DIAGNOSIS — Z9049 Acquired absence of other specified parts of digestive tract: Secondary | ICD-10-CM | POA: Diagnosis not present

## 2021-12-03 DIAGNOSIS — N946 Dysmenorrhea, unspecified: Secondary | ICD-10-CM | POA: Diagnosis not present

## 2021-12-03 DIAGNOSIS — Z8619 Personal history of other infectious and parasitic diseases: Secondary | ICD-10-CM | POA: Diagnosis not present

## 2021-12-03 DIAGNOSIS — D270 Benign neoplasm of right ovary: Secondary | ICD-10-CM | POA: Insufficient documentation

## 2021-12-03 DIAGNOSIS — N83201 Unspecified ovarian cyst, right side: Secondary | ICD-10-CM | POA: Diagnosis present

## 2021-12-03 DIAGNOSIS — Z1589 Genetic susceptibility to other disease: Secondary | ICD-10-CM

## 2021-12-03 DIAGNOSIS — G8929 Other chronic pain: Secondary | ICD-10-CM

## 2021-12-03 DIAGNOSIS — Z01812 Encounter for preprocedural laboratory examination: Secondary | ICD-10-CM

## 2021-12-03 HISTORY — PX: BILATERAL SALPINGECTOMY: SHX5743

## 2021-12-03 HISTORY — PX: ROBOTIC ASSISTED SALPINGO OOPHERECTOMY: SHX6082

## 2021-12-03 LAB — ABO/RH: ABO/RH(D): O POS

## 2021-12-03 LAB — POCT PREGNANCY, URINE: Preg Test, Ur: NEGATIVE

## 2021-12-03 SURGERY — SALPINGO-OOPHORECTOMY, ROBOT-ASSISTED
Anesthesia: General | Site: Pelvis | Laterality: Bilateral

## 2021-12-03 MED ORDER — ONDANSETRON HCL 4 MG/2ML IJ SOLN: 4.0000 mg | Freq: Once | INTRAMUSCULAR | Status: DC | PRN

## 2021-12-03 MED ORDER — POVIDONE-IODINE 10 % EX SWAB: 2.0000 | Freq: Once | CUTANEOUS | Status: DC

## 2021-12-03 MED ORDER — DEXAMETHASONE SODIUM PHOSPHATE 10 MG/ML IJ SOLN
INTRAMUSCULAR | Status: DC | PRN
  Administered 2021-12-03: 10 mg via INTRAVENOUS

## 2021-12-03 MED ORDER — PHENYLEPHRINE 80 MCG/ML (10ML) SYRINGE FOR IV PUSH (FOR BLOOD PRESSURE SUPPORT)
PREFILLED_SYRINGE | INTRAVENOUS | Status: AC
  Filled 2021-12-03: qty 10

## 2021-12-03 MED ORDER — CHLORHEXIDINE GLUCONATE 0.12 % MT SOLN: 15.0000 mL | Freq: Once | OROMUCOSAL | Status: AC

## 2021-12-03 MED ORDER — FENTANYL CITRATE (PF) 100 MCG/2ML IJ SOLN
25.0000 ug | INTRAMUSCULAR | Status: DC | PRN
  Administered 2021-12-03 (×4): 25 ug via INTRAVENOUS

## 2021-12-03 MED ORDER — LIDOCAINE HCL (PF) 2 % IJ SOLN
INTRAMUSCULAR | Status: AC
  Filled 2021-12-03: qty 5

## 2021-12-03 MED ORDER — ESMOLOL HCL 100 MG/10ML IV SOLN
INTRAVENOUS | Status: AC
  Filled 2021-12-03: qty 10

## 2021-12-03 MED ORDER — FAMOTIDINE 20 MG PO TABS
ORAL_TABLET | ORAL | Status: AC
  Administered 2021-12-03: 20 mg via ORAL
  Filled 2021-12-03: qty 1

## 2021-12-03 MED ORDER — ROCURONIUM BROMIDE 100 MG/10ML IV SOLN
INTRAVENOUS | Status: DC | PRN
  Administered 2021-12-03: 50 mg via INTRAVENOUS
  Administered 2021-12-03: 20 mg via INTRAVENOUS

## 2021-12-03 MED ORDER — DEXMEDETOMIDINE HCL IN NACL 80 MCG/20ML IV SOLN
INTRAVENOUS | Status: AC
  Filled 2021-12-03: qty 20

## 2021-12-03 MED ORDER — ORAL CARE MOUTH RINSE: 15.0000 mL | Freq: Once | OROMUCOSAL | Status: AC

## 2021-12-03 MED ORDER — SILVER NITRATE-POT NITRATE 75-25 % EX MISC
CUTANEOUS | Status: DC | PRN
  Administered 2021-12-03: 3

## 2021-12-03 MED ORDER — DEXMEDETOMIDINE HCL IN NACL 200 MCG/50ML IV SOLN
INTRAVENOUS | Status: DC | PRN
  Administered 2021-12-03 (×4): 4 ug via INTRAVENOUS

## 2021-12-03 MED ORDER — FENTANYL CITRATE (PF) 100 MCG/2ML IJ SOLN
INTRAMUSCULAR | Status: AC
  Filled 2021-12-03: qty 2

## 2021-12-03 MED ORDER — FENTANYL CITRATE (PF) 100 MCG/2ML IJ SOLN
INTRAMUSCULAR | Status: DC | PRN
  Administered 2021-12-03 (×2): 50 ug via INTRAVENOUS

## 2021-12-03 MED ORDER — SILVER NITRATE-POT NITRATE 75-25 % EX MISC
CUTANEOUS | Status: AC
  Filled 2021-12-03: qty 10

## 2021-12-03 MED ORDER — LIDOCAINE HCL (CARDIAC) PF 100 MG/5ML IV SOSY
PREFILLED_SYRINGE | INTRAVENOUS | Status: DC | PRN
  Administered 2021-12-03: 60 mg via INTRAVENOUS

## 2021-12-03 MED ORDER — SUGAMMADEX SODIUM 200 MG/2ML IV SOLN
INTRAVENOUS | Status: DC | PRN
  Administered 2021-12-03: 200 mg via INTRAVENOUS

## 2021-12-03 MED ORDER — KETOROLAC TROMETHAMINE 30 MG/ML IJ SOLN
INTRAMUSCULAR | Status: DC | PRN
  Administered 2021-12-03: 30 mg via INTRAVENOUS

## 2021-12-03 MED ORDER — PHENYLEPHRINE 80 MCG/ML (10ML) SYRINGE FOR IV PUSH (FOR BLOOD PRESSURE SUPPORT)
PREFILLED_SYRINGE | INTRAVENOUS | Status: DC | PRN
  Administered 2021-12-03: 160 ug via INTRAVENOUS
  Administered 2021-12-03 (×2): 80 ug via INTRAVENOUS

## 2021-12-03 MED ORDER — IBUPROFEN 600 MG PO TABS: 600.0000 mg | ORAL_TABLET | Freq: Four times a day (QID) | ORAL | 0 refills | Status: DC

## 2021-12-03 MED ORDER — FAMOTIDINE 20 MG PO TABS: 20.0000 mg | ORAL_TABLET | Freq: Once | ORAL | Status: AC

## 2021-12-03 MED ORDER — 0.9 % SODIUM CHLORIDE (POUR BTL) OPTIME
TOPICAL | Status: DC | PRN
  Administered 2021-12-03: 500 mL

## 2021-12-03 MED ORDER — ONDANSETRON HCL 4 MG/2ML IJ SOLN
INTRAMUSCULAR | Status: DC | PRN
  Administered 2021-12-03: 4 mg via INTRAVENOUS

## 2021-12-03 MED ORDER — PROPOFOL 10 MG/ML IV BOLUS
INTRAVENOUS | Status: DC | PRN
  Administered 2021-12-03: 150 mg via INTRAVENOUS
  Administered 2021-12-03: 20 mg via INTRAVENOUS

## 2021-12-03 MED ORDER — BUPIVACAINE HCL 0.5 % IJ SOLN
INTRAMUSCULAR | Status: DC | PRN
  Administered 2021-12-03: 10 mL

## 2021-12-03 MED ORDER — ONDANSETRON 4 MG PO TBDP: 4.0000 mg | ORAL_TABLET | Freq: Four times a day (QID) | ORAL | 0 refills | Status: DC | PRN

## 2021-12-03 MED ORDER — MIDAZOLAM HCL 2 MG/2ML IJ SOLN
INTRAMUSCULAR | Status: DC | PRN
  Administered 2021-12-03: 2 mg via INTRAVENOUS

## 2021-12-03 MED ORDER — ONDANSETRON HCL 4 MG/2ML IJ SOLN
INTRAMUSCULAR | Status: AC
  Filled 2021-12-03: qty 2

## 2021-12-03 MED ORDER — LACTATED RINGERS IV SOLN: INTRAVENOUS | Status: DC

## 2021-12-03 MED ORDER — CHLORHEXIDINE GLUCONATE 0.12 % MT SOLN
OROMUCOSAL | Status: AC
  Administered 2021-12-03: 15 mL via OROMUCOSAL
  Filled 2021-12-03: qty 15

## 2021-12-03 MED ORDER — ACETAMINOPHEN 10 MG/ML IV SOLN
INTRAVENOUS | Status: AC
  Filled 2021-12-03: qty 100

## 2021-12-03 MED ORDER — ROCURONIUM BROMIDE 10 MG/ML (PF) SYRINGE
PREFILLED_SYRINGE | INTRAVENOUS | Status: AC
  Filled 2021-12-03: qty 10

## 2021-12-03 MED ORDER — PROPOFOL 10 MG/ML IV BOLUS
INTRAVENOUS | Status: AC
  Filled 2021-12-03: qty 20

## 2021-12-03 MED ORDER — KETOROLAC TROMETHAMINE 30 MG/ML IJ SOLN
INTRAMUSCULAR | Status: AC
  Filled 2021-12-03: qty 1

## 2021-12-03 MED ORDER — DEXAMETHASONE SODIUM PHOSPHATE 10 MG/ML IJ SOLN
INTRAMUSCULAR | Status: AC
  Filled 2021-12-03: qty 1

## 2021-12-03 MED ORDER — ACETAMINOPHEN 10 MG/ML IV SOLN
INTRAVENOUS | Status: DC | PRN
  Administered 2021-12-03: 1000 mg via INTRAVENOUS

## 2021-12-03 MED ORDER — MIDAZOLAM HCL 2 MG/2ML IJ SOLN
INTRAMUSCULAR | Status: AC
  Filled 2021-12-03: qty 2

## 2021-12-03 MED ORDER — BUPIVACAINE HCL (PF) 0.5 % IJ SOLN
INTRAMUSCULAR | Status: AC
  Filled 2021-12-03: qty 30

## 2021-12-03 MED ORDER — HYDROCODONE-ACETAMINOPHEN 5-325 MG PO TABS: 1.0000 | ORAL_TABLET | Freq: Four times a day (QID) | ORAL | 0 refills | Status: AC | PRN

## 2021-12-03 SURGICAL SUPPLY — 72 items
ADH SKN CLS APL DERMABOND .7 (GAUZE/BANDAGES/DRESSINGS) ×1
BAG DRN RND TRDRP ANRFLXCHMBR (UROLOGICAL SUPPLIES) ×1
BAG URINE DRAIN 2000ML AR STRL (UROLOGICAL SUPPLIES) ×1 IMPLANT
BASIN KIT SINGLE STR (MISCELLANEOUS) ×1 IMPLANT
BLADE SURG SZ11 CARB STEEL (BLADE) ×1 IMPLANT
CANNULA REDUC XI 12-8 STAPL (CANNULA) ×1
CANNULA REDUCER 12-8 DVNC XI (CANNULA) IMPLANT
CATH FOLEY 2WAY  5CC 16FR (CATHETERS) ×1
CATH FOLEY 2WAY 5CC 16FR (CATHETERS) ×1
CATH URTH 16FR FL 2W BLN LF (CATHETERS) ×1 IMPLANT
COVER MAYO STAND REUSABLE (DRAPES) ×1 IMPLANT
COVER TIP SHEARS 8 DVNC (MISCELLANEOUS) ×1 IMPLANT
COVER TIP SHEARS 8MM DA VINCI (MISCELLANEOUS) ×1
COVER WAND RF STERILE (DRAPES) ×1 IMPLANT
DERMABOND ADVANCED .7 DNX12 (GAUZE/BANDAGES/DRESSINGS) ×1 IMPLANT
DRAPE 3/4 80X56 (DRAPES) ×1 IMPLANT
DRAPE ARM DVNC X/XI (DISPOSABLE) ×3 IMPLANT
DRAPE COLUMN DVNC XI (DISPOSABLE) ×1 IMPLANT
DRAPE DA VINCI XI ARM (DISPOSABLE) ×3
DRAPE DA VINCI XI COLUMN (DISPOSABLE) ×1
DRAPE UNDER BUTTOCK W/FLU (DRAPES) ×1 IMPLANT
ELECT REM PT RETURN 9FT ADLT (ELECTROSURGICAL) ×1
ELECTRODE REM PT RTRN 9FT ADLT (ELECTROSURGICAL) ×1 IMPLANT
GLOVE BIO SURGEON STRL SZ7 (GLOVE) ×2 IMPLANT
GLOVE SURG UNDER POLY LF SZ7.5 (GLOVE) ×3 IMPLANT
GOWN STRL REUS W/ TWL LRG LVL3 (GOWN DISPOSABLE) ×3 IMPLANT
GOWN STRL REUS W/TWL LRG LVL3 (GOWN DISPOSABLE) ×3
GRASPER SUT TROCAR 14GX15 (MISCELLANEOUS) IMPLANT
GYRUS RUMI II 2.5CM BLUE (DISPOSABLE)
GYRUS RUMI II 3.5CM BLUE (DISPOSABLE)
IRRIGATION STRYKERFLOW (MISCELLANEOUS) IMPLANT
IRRIGATOR STRYKERFLOW (MISCELLANEOUS)
IV NS 1000ML (IV SOLUTION) ×1
IV NS 1000ML BAXH (IV SOLUTION) ×1 IMPLANT
KIT IMAGING PINPOINTPAQ (MISCELLANEOUS) IMPLANT
KIT PINK PAD W/HEAD ARE REST (MISCELLANEOUS) ×1
KIT PINK PAD W/HEAD ARM REST (MISCELLANEOUS) ×1 IMPLANT
LABEL OR SOLS (LABEL) ×1 IMPLANT
MANIFOLD NEPTUNE II (INSTRUMENTS) ×1 IMPLANT
MANIPULATOR UTERINE 4.5 ZUMI (MISCELLANEOUS) IMPLANT
NEEDLE HYPO 22GX1.5 SAFETY (NEEDLE) ×1 IMPLANT
NS IRRIG 1000ML POUR BTL (IV SOLUTION) ×1 IMPLANT
OBTURATOR OPTICAL STANDARD 8MM (TROCAR) ×2
OBTURATOR OPTICAL STND 8 DVNC (TROCAR) ×2
OBTURATOR OPTICALSTD 8 DVNC (TROCAR) ×1 IMPLANT
PACK LAP CHOLECYSTECTOMY (MISCELLANEOUS) ×1 IMPLANT
PAD OB MATERNITY 4.3X12.25 (PERSONAL CARE ITEMS) ×1 IMPLANT
PAD PREP 24X41 OB/GYN DISP (PERSONAL CARE ITEMS) ×1 IMPLANT
PENCIL SMOKE EVACUATOR (MISCELLANEOUS) IMPLANT
RUMI II 3.0CM BLUE KOH-EFFICIE (DISPOSABLE) IMPLANT
RUMI II GYRUS 2.5CM BLUE (DISPOSABLE) IMPLANT
RUMI II GYRUS 3.5CM BLUE (DISPOSABLE) IMPLANT
SCISSORS METZENBAUM CVD 33 (INSTRUMENTS) IMPLANT
SCRUB CHG 4% DYNA-HEX 4OZ (MISCELLANEOUS) ×1 IMPLANT
SEAL CANN UNIV 5-8 DVNC XI (MISCELLANEOUS) ×3 IMPLANT
SEAL XI 5MM-8MM UNIVERSAL (MISCELLANEOUS) ×3
SEALER VESSEL DA VINCI XI (MISCELLANEOUS)
SEALER VESSEL EXT DVNC XI (MISCELLANEOUS) IMPLANT
SOLUTION ELECTROLUBE (MISCELLANEOUS) ×1 IMPLANT
SPONGE T-LAP 18X18 ~~LOC~~+RFID (SPONGE) IMPLANT
SURGILUBE 2OZ TUBE FLIPTOP (MISCELLANEOUS) ×1 IMPLANT
SUT MNCRL 4-0 (SUTURE) ×1
SUT MNCRL 4-0 27XMFL (SUTURE) ×1
SUT VIC AB 0 CT1 36 (SUTURE) IMPLANT
SUT VICRYL 0 AB UR-6 (SUTURE) IMPLANT
SUT VICRYL 3-0 27IN (SUTURE) IMPLANT
SUTURE MNCRL 4-0 27XMF (SUTURE) ×1 IMPLANT
SYR 10ML LL (SYRINGE) ×1 IMPLANT
TOWEL OR 17X26 4PK STRL BLUE (TOWEL DISPOSABLE) ×1 IMPLANT
TRAP FLUID SMOKE EVACUATOR (MISCELLANEOUS) ×1 IMPLANT
TUBING EVAC SMOKE HEATED PNEUM (TUBING) ×1 IMPLANT
WATER STERILE IRR 500ML POUR (IV SOLUTION) ×1 IMPLANT

## 2021-12-03 NOTE — Interval H&P Note (Signed)
History and Physical Interval Note:  12/03/2021 10:49 AM  Alicia Carr  has presented today for surgery, with the diagnosis of right ovarian dermoid cyst, dysmenorrhea.  The various methods of treatment have been discussed with the patient and family. After consideration of risks, benefits and other options for treatment, the patient has consented to  Procedure(s): XI ROBOTIC ASSISTED SALPINGO OOPHORECTOMY (Right), Left salpingectomy, removal of abnormal tissue as a surgical intervention.  The patient's history has been reviewed, patient examined, no change in status, stable for surgery.  I have reviewed the patient's chart and labs.  Questions were answered to the patient's satisfaction.  A detailed discussion was had with the patient yesterday about the benefits of performing this particular surgery versus removal of just the cyst.  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  All questions answered and the patient agrees to proceed.   Prentice Docker, MD, [facility] 12/03/2021 10:51 AM

## 2021-12-03 NOTE — Anesthesia Postprocedure Evaluation (Signed)
Anesthesia Post Note  [name]  Procedure(s) Performed: XI ROBOTIC ASSISTED BILATERAL SALPINGECTOMY AND RIGHT OOPHORECTOMY (Bilateral: Pelvis)  Patient location during evaluation: PACU Anesthesia Type: General Level of consciousness: awake and oriented Pain management: pain level controlled Vital Signs Assessment: post-procedure vital signs reviewed and stable Respiratory status: nonlabored ventilation and respiratory function stable Cardiovascular status: stable Anesthetic complications: no   No notable events documented.   Last Vitals:  Vitals:   12/03/21 1355 12/03/21 1400  BP:    Pulse: 75 78  Resp: 12 15  Temp:    SpO2: 100% 100%    Last Pain:  Vitals:   12/03/21 1355  TempSrc:   PainSc: Asleep                 VAN STAVEREN,Kerolos Nehme

## 2021-12-03 NOTE — Op Note (Signed)
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 Robot assisted laparoscopic right salpingo-oophorectomy 2. Robot assisted laparoscopic left salpingectomy  Primary Surgeon: Prentice Docker, MD   EBL: 15 mL   IVF: 1,400 mL   Urine output: 350 mL  Specimens:  1) Right fallopian tube and ovary (with cyst) 2) Left fallopian tube  Drains: none  Complications: None   Disposition: PACU   Condition: Stable   Findings:  1) normal appearing uterus, bilateral fallopian tubes. 2) Left ovary with small simple-appearing cyst 3) Right ovary enlarged  4) No evidence of endometriosis 5) Normal appearing appendix  Procedure Summary:  The patient was taken to the operating room where general anesthesia was administered and found to be adequate. She was placed in the dorsal, supine lithotomy position in [place] stirrups and prepped and draped in the usual sterile fashion. After a timeout was called an indwelling catheter was placed in her bladder.  The cervix was serially dilated after grasping the anterior lip with a single-tooth tenaculum.  A ZUMI uterine manipulator was placed in accordance with the manufacturer's recommendations.  The tenaculum was removed.  The speculum was removed.  Attention was turned to the abdomen where after injection of local anesthetic, an 12 mm infraumbilical incision was made with the scalpel. Entry into the abdomen was obtained via Optiview trocar technique (a blunt entry technique with camera visualization through the obturator upon entry). Verification of entry into the abdomen  was obtained using opening pressures. The abdomen was insufflated with CO2. The camera was introduced through the trocar with verification of atraumatic entry.  The initial trocar placed through the infraumbilical incision was an 8 mm trocar.  Right and left abdominal entry sites were created after injection of local anesthetic about 8 cm away from the umbilical port in accordance with the Intuitive manufacturer's recommendations.  The port sites were 8 mm.  The intuitive trochars were introduced under intra-abdominal camera visualization without difficulty.  Using the camera in the right lateral port the 8 mm cannula was swapped for a 12 mm cannula.  An inspection of the abdomen and pelvis was undertaken at this point with the above-noted findings.  The XI robot was docked on the patient's left.  Clearance was verified from the patient's legs.  Through the umbilical port the camera was placed (arm 3).  Through the port attached to arm 4 the forced bipolar forceps were was placed.  The vessel sealer was attached to port 2.  Attention was turned to a more detailed inspection of the pelvis, with the above-noted findings.  Both ureters were easily identified and found to be well away from the operative area of interest.  The right infundibulopelvic ligament was identified and isolated.  It was grasped with the vessel sealer, cauterized, and transected.  This was continued to nearly the level of the right round ligament.  The fallopian tube was grasped at the right cornu, cauterized, and transected.  The right utero-ovarian ligament was cauterized and transected to the level of the previous lateral transection to liberate the right fallopian tube and ovary.  The specimen was placed in the posterior cul-de-sac.  Attention was turned to the left fallopian tube.  The left fallopian tube was grasped at the fimbriated end and in a lateral to medial fashion was cauterized and transected along the mesosalpinx until it  was transected at the left cornu.  This was also placed in the cul-de-sac.  Hemostasis was verified along all vascular pedicles.  The camera was port hopped to arm 4.  The specimen retrieval bag was introduced through the umbilical port.  Both specimens were placed in the retrieval bag.  Again all pedicle sites were visualized to be hemostatic.  All instruments were removed from the operative ports.  The robot was undocked.  The specimen was removed from the bag through the umbilical incision.  The umbilical incision fascia was reapproximated using 0 Vicryl in a figure-of-eight stitch.  This was done under direct intra-abdominal camera visualization.  The abdomen was emptied of CO2 with the help of 5 deep breaths from anesthesia.  The trocars were removed.  The skin was reapproximated using 4-0 Monocryl in a subcuticular fashion.  Surgical skin glue was applied to all incision sites.  Attention was turned to the pelvis.  The Foley catheter was removed.  The [place] manipulator was removed.  Silver nitrate was utilized to attempt hemostasis at the tenaculum entry sites.  However, on the left tenaculum entry site hemostasis was not obtained.  So, a 3-0 Vicryl was thrown in a figure-of-eight fashion and hemostasis was noted after this stitch was placed.  The vagina was inspected and found to be clear of all instruments and sponges.  The speculum was removed.  The patient tolerated the procedure well.  Sponge, lap, needle, and instrument counts were correct x 2.  VTE prophylaxis: SCDs. Antibiotic prophylaxis: none indicated. She was awakened in the operating room and was taken to the PACU in stable condition.   Prentice Docker, MD 12/03/2021 1:07 PM

## 2021-12-03 NOTE — Transfer of Care (Signed)
Immediate Anesthesia Transfer of Care Note  Patient: LUE SYKORA  Procedure(s) Performed: XI ROBOTIC ASSISTED BILATERAL SALPINGECTOMY AND RIGHT OOPHORECTOMY (Bilateral: Pelvis)  Patient Location: PACU  Anesthesia Type:General  Level of Consciousness: drowsy  Airway & Oxygen Therapy: Patient Spontanous Breathing and Patient connected to face mask oxygen  Post-op Assessment: Report given to RN and Post -op Vital signs reviewed and stable  Post vital signs: Reviewed and stable  Last Vitals:  Vitals Value Taken Time  BP 117/73 12/03/21 1314  Temp    Pulse    Resp 14 12/03/21 1318  SpO2    Vitals shown include unvalidated device data.  Last Pain:  Vitals:   12/03/21 0950  TempSrc: Temporal  PainSc: 0-No pain      Patients Stated Pain Goal: 0 (72/89/79 1504)  Complications: No notable events documented.

## 2021-12-03 NOTE — Anesthesia Procedure Notes (Signed)
Procedure Name: Intubation Date/Time: 12/03/2021 11:04 AM  Performed by: Tollie Eth, CRNAPre-anesthesia Checklist: Patient identified, Patient being monitored, Timeout performed, Emergency Drugs available and Suction available Patient Re-evaluated:Patient Re-evaluated prior to induction Oxygen Delivery Method: Circle system utilized Preoxygenation: Pre-oxygenation with 100% oxygen Induction Type: IV induction Ventilation: Mask ventilation without difficulty Laryngoscope Size: 3 and McGraph Grade View: Grade I Tube type: Oral Tube size: 7.0 mm Number of attempts: 1 Airway Equipment and Method: Stylet Placement Confirmation: ETT inserted through vocal cords under direct vision, positive ETCO2 and breath sounds checked- equal and bilateral Secured at: 21 cm Tube secured with: Tape Dental Injury: Teeth and Oropharynx as per pre-operative assessment

## 2021-12-03 NOTE — Discharge Instructions (Signed)

## 2021-12-03 NOTE — Anesthesia Preprocedure Evaluation (Signed)
Anesthesia Evaluation  Patient identified by MRN, date of birth, ID band Patient awake    Reviewed: Allergy & Precautions, NPO status , Patient's Chart, lab work & pertinent test results  Airway Mallampati: II  TM Distance: >3 FB Neck ROM: Full    Dental  (+) Teeth Intact   Pulmonary neg pulmonary ROS,    Pulmonary exam normal breath sounds clear to auscultation       Cardiovascular Exercise Tolerance: Good negative cardio ROS Normal cardiovascular exam Rhythm:Regular     Neuro/Psych negative neurological ROS  negative psych ROS   GI/Hepatic negative GI ROS, Neg liver ROS,   Endo/Other  negative endocrine ROS  Renal/GU negative Renal ROS     Musculoskeletal   Abdominal Normal abdominal exam  (+)   Peds negative pediatric ROS (+)  Hematology negative hematology ROS (+) Blood dyscrasia, anemia ,   Anesthesia Other Findings Past Medical History: No date: Alcoholism ([place]) No date: Anemia No date: Early hepatic fibrosis No date: Family history of ovarian cancer No date: Family history of prostate cancer No date: Gallstones No date: History of chicken pox No date: Hyperpigmentation 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 No date: Nevus     Comment:  right dorsal wrist No date: Positive ANA (antinuclear antibody) No date: Primary sclerosing cholangitis No date: Pruritus No date: Raynaud's syndrome 07/18/2020: Shingles 2003: Vaginal delivery  Past Surgical History: 08/08/2006: CESAREAN SECTION 2003: CHOLECYSTECTOMY 09/2020: COLONOSCOPY 05/2020: LIVER BIOPSY  BMI    Body Mass Index: 27.06 kg/m      Reproductive/Obstetrics negative OB ROS                             Anesthesia Physical Anesthesia Plan  ASA: 2  Anesthesia Plan: General   Post-op Pain Management:    Induction: Intravenous  PONV Risk Score and Plan: 1 and Ondansetron and Dexamethasone  Airway Management  Planned: Oral ETT  Additional Equipment:   Intra-op Plan:   Post-operative Plan: Extubation in OR  Informed Consent: I have reviewed the patients History and Physical, chart, labs and discussed the procedure including the risks, benefits and alternatives for the proposed anesthesia with the patient or authorized representative who has indicated his/her understanding and acceptance.     Dental Advisory Given  Plan Discussed with: CRNA and Surgeon  Anesthesia Plan Comments:         Anesthesia Quick Evaluation

## 2021-12-06 LAB — SURGICAL PATHOLOGY

## 2021-12-14 ENCOUNTER — Ambulatory Visit (INDEPENDENT_AMBULATORY_CARE_PROVIDER_SITE_OTHER): Payer: Managed Care, Other (non HMO) | Admitting: Physician Assistant

## 2021-12-14 ENCOUNTER — Encounter: Payer: Self-pay | Admitting: Physician Assistant

## 2021-12-14 VITALS — BP 130/80 | HR 82 | Temp 98.6°F | Ht 61.0 in | Wt 137.0 lb

## 2021-12-14 DIAGNOSIS — Z23 Encounter for immunization: Secondary | ICD-10-CM

## 2021-12-14 DIAGNOSIS — E663 Overweight: Secondary | ICD-10-CM | POA: Diagnosis not present

## 2021-12-14 DIAGNOSIS — L608 Other nail disorders: Secondary | ICD-10-CM

## 2021-12-14 DIAGNOSIS — D509 Iron deficiency anemia, unspecified: Secondary | ICD-10-CM | POA: Diagnosis not present

## 2021-12-14 DIAGNOSIS — E785 Hyperlipidemia, unspecified: Secondary | ICD-10-CM

## 2021-12-14 DIAGNOSIS — Z0000 Encounter for general adult medical examination without abnormal findings: Secondary | ICD-10-CM

## 2021-12-14 LAB — COMPREHENSIVE METABOLIC PANEL
ALT: 19 U/L (ref 0–35)
AST: 28 U/L (ref 0–37)
Albumin: 4.7 g/dL (ref 3.5–5.2)
Alkaline Phosphatase: 81 U/L (ref 39–117)
BUN: 12 mg/dL (ref 6–23)
CO2: 23 mEq/L (ref 19–32)
Calcium: 9.7 mg/dL (ref 8.4–10.5)
Chloride: 102 mEq/L (ref 96–112)
Creatinine, Ser: 0.77 mg/dL (ref 0.40–1.20)
GFR: 96.73 mL/min (ref >60.00)
Glucose, Bld: 83 mg/dL (ref 70–99)
Potassium: 4.4 mEq/L (ref 3.5–5.1)
Sodium: 136 mEq/L (ref 135–145)
Total Bilirubin: 0.4 mg/dL (ref 0.2–1.2)
Total Protein: 7.7 g/dL (ref 6.0–8.3)

## 2021-12-14 LAB — IBC + FERRITIN
Ferritin: 10.2 ng/mL (ref 10.0–291.0)
Iron: 41 ug/dL — ABNORMAL LOW (ref 42–145)
Saturation Ratios: 7.5 % — ABNORMAL LOW (ref 20.0–50.0)
TIBC: 548.8 ug/dL — ABNORMAL HIGH (ref 250.0–450.0)
Transferrin: 392 mg/dL — ABNORMAL HIGH (ref 212.0–360.0)

## 2021-12-14 LAB — LIPID PANEL
Cholesterol: 217 mg/dL — ABNORMAL HIGH (ref 0–200)
HDL: 77.8 mg/dL (ref >39.00)
LDL Cholesterol: 123 mg/dL — ABNORMAL HIGH (ref 0–99)
NonHDL: 139.69
Total CHOL/HDL Ratio: 3
Triglycerides: 81 mg/dL (ref 0.0–149.0)
VLDL: 16.2 mg/dL (ref 0.0–40.0)

## 2021-12-14 NOTE — Patient Instructions (Addendum)
It was great to see you!  Please follow-up with Dr Benjamine Mola and your other specialists as recommended. Let me know how I can support you!  Please go to the lab for blood work.   Our office will call you with your results unless you have chosen to receive results via MyChart.  If your blood work is normal we will follow-up each year for physicals and as scheduled for chronic medical problems.  If anything is abnormal we will treat accordingly and get you in for a follow-up.  Take care,  [name]

## 2021-12-14 NOTE — Progress Notes (Signed)
Subjective:    Alicia Carr is a 41 y.o. female and is here for a comprehensive physical exam.  HPI  Health Maintenance Due  Topic Date Due   INFLUENZA VACCINE  09/28/2021    Acute Concerns: Iron deficiency anemia She reports that she might be anemic due to not taking her iron supplement. Had stopped taking this because she was told that it may be contributing to her hyperpigmentation.  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 She is concerned that there is something more significant going on with her hyperpigmentation of her skin. Her rheumatologist has discussed possibly sending her to a dermatology specialist.  Chronic Issues: RAD51C gene mutation Recent discovery. Plants to have remaining ovary taken out sometime between ages 57-50.  Health Maintenance: Social history-- She states there has not been any changes to her family history. She has been drinking more alcohol than usual, around 3 drinks a week, due to health stress. Her highest alcohol intake was 1-2 drinks a day everyday due to stress.  Immunization: She is interested in receiving the influenza vaccine during today's visit.   Colonoscopy -- Last completed on 10/09/2020 with normal results.  Mammogram -- Last completed on 11/22/2021. PAP -- Last completed on 11/29/2019. Bone Density -- Last completed on 12/10/2020.  UTD with dentist? - She is UTD with dentist.  UTD with eye doctor? - She is UTD with eye doctor.   Weight history: Wt Readings from Last 10 Encounters:  12/14/21 137 lb (62.1 kg)  12/03/21 138 lb 14.2 oz (63 kg)  11/24/21 139 lb (63 kg)  10/26/21 139 lb 6.4 oz (63.2 kg)  09/08/21 137 lb 3.2 oz (62.2 kg)  08/02/21 136 lb (61.7 kg)  05/25/21 131 lb 3.2 oz (59.5 kg)  05/03/21 131 lb (59.4 kg)  04/07/21 129 lb (58.5 kg)  03/17/21 128 lb (58.1 kg)   Body mass index is 25.89 kg/m. Patient's last menstrual period was 11/25/2021 (approximate).  Alcohol use:  reports current  alcohol use of about 3.0 standard drinks of alcohol per week.  Tobacco use:  Tobacco Use: Low Risk  (12/14/2021)   Patient History    Smoking Tobacco Use: Never    Smokeless Tobacco Use: Never    Passive Exposure: Not on file   Eligible for lung cancer screening? no     12/14/2021    1:04 PM  Depression screen PHQ 2/9  Decreased Interest 0  Down, Depressed, Hopeless 0  PHQ - 2 Score 0  Altered sleeping 3  Tired, decreased energy 3  Change in appetite 0  Feeling bad or failure about yourself  0  Trouble concentrating 0  Moving slowly or fidgety/restless 0  Suicidal thoughts 0  PHQ-9 Score 6  Difficult doing work/chores Somewhat difficult     Other providers/specialists: Patient Care Team: Inda Coke, [place] as PCP - General (Physician Assistant)    PMHx, SurgHx, SocialHx, Medications, and Allergies were reviewed in the Visit Navigator and updated as appropriate.   Past Medical History:  Diagnosis Date   Alcoholism ([place])    Anemia    Early hepatic fibrosis    Family history of ovarian cancer    Family history of prostate cancer    Gallstones    History of chicken pox    Hyperpigmentation    Longitudinal melanonychia    Nevus    right dorsal wrist   Positive ANA (antinuclear antibody)    Primary sclerosing cholangitis    Pruritus    Raynaud's  syndrome    Shingles 07/18/2020   Vaginal delivery 2003     Past Surgical History:  Procedure Laterality Date   CESAREAN SECTION  08/08/2006   CHOLECYSTECTOMY  2003   COLONOSCOPY  09/2020   LIVER BIOPSY  05/2020   ROBOTIC ASSISTED SALPINGO OOPHERECTOMY Bilateral 12/03/2021   Procedure: XI ROBOTIC ASSISTED BILATERAL SALPINGECTOMY AND RIGHT OOPHORECTOMY;  Surgeon: Will Bonnet, MD;  Location: [facility] ORS;  Service: Gynecology;  Laterality: Bilateral;   TUBAL LIGATION  2023   Tubes were actually removed due to cancer gene mutation     Family History  Problem Relation Age of Onset   Mental illness Mother         paranoid schizophrenia   Hyperlipidemia Mother    Alcohol abuse Father    Colon polyps Father    Hypertension Father    Prostate cancer Father 86   Healthy Daughter    Asthma Daughter    Learning disabilities Daughter    ADD / ADHD Daughter    Anxiety disorder Daughter    Diabetes Paternal Aunt    Cancer Maternal Grandmother        uncertain type   Heart murmur Brother    Healthy Son    Ovarian cancer Other        PGF sister   Breast cancer Neg Hx     Social History   Tobacco Use   Smoking status: Never   Smokeless tobacco: Never  Vaping Use   Vaping Use: Never used  Substance Use Topics   Alcohol use: Yes    Alcohol/week: 3.0 standard drinks of alcohol    Types: 3 Glasses of wine per week    Comment: Drinking more than special occasions but still limiting   Drug use: No    Review of Systems:   Review of Systems  Constitutional:  Negative for chills, fever, malaise/fatigue and weight loss.  HENT:  Negative for hearing loss, sinus pain and sore throat.   Respiratory:  Negative for cough and hemoptysis.   Cardiovascular:  Negative for chest pain, palpitations, orthopnea, leg swelling and PND.  Gastrointestinal:  Negative for abdominal pain, constipation, diarrhea, heartburn, nausea and vomiting.  Genitourinary:  Negative for dysuria, frequency and urgency.  Musculoskeletal:  Negative for back pain, myalgias and neck pain.  Skin:  Negative for itching and rash.  Endo/Heme/Allergies:  Negative for polydipsia.  Psychiatric/Behavioral:  Negative for depression. The patient is not nervous/anxious.     Objective:   BP 130/80 (BP Location: Left Arm, Patient Position: Sitting, Cuff Size: Normal)   Pulse 82   Temp 98.6 F (37 C) (Temporal)   Ht 5' 1"  (1.549 m)   Wt 137 lb (62.1 kg)   LMP 11/25/2021 (Approximate)   SpO2 100%   BMI 25.89 kg/m  Body mass index is 25.89 kg/m.   General Appearance:    Alert, cooperative, no distress, appears stated age   Head:    Normocephalic, without obvious abnormality, atraumatic  Eyes:    PERRL, conjunctiva/corneas clear, EOM's intact, fundi    benign, both eyes  Ears:    Normal TM's and external ear canals, both ears  Nose:   Nares normal, septum midline, mucosa normal, no drainage    or sinus tenderness  Throat:   Lips, mucosa, and tongue normal; teeth and gums normal  Neck:   Supple, symmetrical, trachea midline, no adenopathy;    thyroid:  no enlargement/tenderness/nodules; no carotid   bruit or JVD  Back:  Symmetric, no curvature, ROM normal, no CVA tenderness  Lungs:     Clear to auscultation bilaterally, respirations unlabored  Chest Wall:    No tenderness or deformity   Heart:    Regular rate and rhythm, S1 and S2 normal, no murmur, rub or gallop  Breast Exam:    Deferred  Abdomen:     Soft, non-tender, bowel sounds active all four quadrants,    no masses, no organomegaly  Genitalia:    Deferred  Extremities:   Extremities normal, atraumatic, no cyanosis or edema  Pulses:   2+ and symmetric all extremities  Skin:   Skin color, texture, turgor normal, no rashes or lesions  Lymph nodes:   Cervical, supraclavicular, and axillary nodes normal  Neurologic:   CNII-XII intact, normal strength, sensation and reflexes    throughout    Assessment/Plan:   Routine physical examination Today patient counseled on age appropriate routine health concerns for screening and prevention, each reviewed and up to date or declined. Immunizations reviewed and up to date or declined. Labs ordered and reviewed. Risk factors for depression reviewed and negative. Hearing function and visual acuity are intact. ADLs screened and addressed as needed. Functional ability and level of safety reviewed and appropriate. Education, counseling and referrals performed based on assessed risks today. Patient provided with a copy of personalized plan for preventive services.  Longitudinal melanonychia Reviewed most recent  dermatology note Consider dermatology specialty referral  Hyperlipidemia, unspecified hyperlipidemia type Update lipid panel and make recommendations accordingly  Overweight Continue healthy diet and exercise as able  Iron deficiency anemia, unspecified iron deficiency anemia type Recheck levels today and make recommendations accordingly   I,Param Shah,acting as a scribe for Inda Coke, PA.,have documented all relevant documentation on the behalf of Inda Coke, PA,as directed by  Inda Coke, PA while in the presence of Inda Coke, [place].  I, Inda Coke, [place], have reviewed all documentation for this visit. The documentation on 12/14/21 for the exam, diagnosis, procedures, and orders are all accurate and complete.   Inda Coke, PA-C [place]

## 2021-12-15 ENCOUNTER — Other Ambulatory Visit: Payer: Self-pay

## 2021-12-15 DIAGNOSIS — Z0000 Encounter for general adult medical examination without abnormal findings: Secondary | ICD-10-CM

## 2021-12-17 ENCOUNTER — Encounter: Payer: Self-pay | Admitting: Physician Assistant

## 2021-12-17 ENCOUNTER — Other Ambulatory Visit (INDEPENDENT_AMBULATORY_CARE_PROVIDER_SITE_OTHER): Payer: Managed Care, Other (non HMO)

## 2021-12-17 DIAGNOSIS — Z0000 Encounter for general adult medical examination without abnormal findings: Secondary | ICD-10-CM

## 2021-12-17 LAB — CBC WITH DIFFERENTIAL/PLATELET
Basophils Absolute: 0 10*3/uL (ref 0.0–0.1)
Basophils Relative: 0.5 % (ref 0.0–3.0)
Eosinophils Absolute: 0.1 10*3/uL (ref 0.0–0.7)
Eosinophils Relative: 1.3 % (ref 0.0–5.0)
HCT: 33.9 % — ABNORMAL LOW (ref 36.0–46.0)
Hemoglobin: 10.9 g/dL — ABNORMAL LOW (ref 12.0–15.0)
Lymphocytes Relative: 27.7 % (ref 12.0–46.0)
Lymphs Abs: 1.9 10*3/uL (ref 0.7–4.0)
MCHC: 32.2 g/dL (ref 30.0–36.0)
MCV: 81.5 fl (ref 78.0–100.0)
Monocytes Absolute: 0.4 10*3/uL (ref 0.1–1.0)
Monocytes Relative: 5.2 % (ref 3.0–12.0)
Neutro Abs: 4.6 10*3/uL (ref 1.4–7.7)
Neutrophils Relative %: 65.3 % (ref 43.0–77.0)
Platelets: 269 10*3/uL (ref 150.0–400.0)
RBC: 4.15 Mil/uL (ref 3.87–5.11)
RDW: 15.2 % (ref 11.5–15.5)
WBC: 7 10*3/uL (ref 4.0–10.5)

## 2021-12-27 ENCOUNTER — Encounter: Payer: Self-pay | Admitting: Hematology and Oncology

## 2021-12-27 ENCOUNTER — Inpatient Hospital Stay: Payer: Managed Care, Other (non HMO) | Attending: Genetic Counselor | Admitting: Hematology and Oncology

## 2021-12-27 ENCOUNTER — Inpatient Hospital Stay: Payer: Managed Care, Other (non HMO)

## 2021-12-27 VITALS — BP 122/78 | HR 86 | Temp 98.1°F | Resp 18 | Ht 61.0 in | Wt 141.6 lb

## 2021-12-27 DIAGNOSIS — Z8042 Family history of malignant neoplasm of prostate: Secondary | ICD-10-CM

## 2021-12-27 DIAGNOSIS — Z1589 Genetic susceptibility to other disease: Secondary | ICD-10-CM | POA: Insufficient documentation

## 2021-12-27 DIAGNOSIS — Z1509 Genetic susceptibility to other malignant neoplasm: Secondary | ICD-10-CM | POA: Diagnosis not present

## 2021-12-27 DIAGNOSIS — Z1501 Genetic susceptibility to malignant neoplasm of breast: Secondary | ICD-10-CM | POA: Insufficient documentation

## 2021-12-27 DIAGNOSIS — Z8041 Family history of malignant neoplasm of ovary: Secondary | ICD-10-CM

## 2021-12-27 DIAGNOSIS — Z1502 Genetic susceptibility to malignant neoplasm of ovary: Secondary | ICD-10-CM | POA: Insufficient documentation

## 2021-12-27 DIAGNOSIS — Z9189 Other specified personal risk factors, not elsewhere classified: Secondary | ICD-10-CM

## 2021-12-27 NOTE — Progress Notes (Signed)
[facility] CONSULT NOTE  Patient Care Team: Inda Coke, [place] as PCP - General (Physician Assistant)  CHIEF COMPLAINTS/PURPOSE OF CONSULTATION:  High risk for [facility]  ASSESSMENT & PLAN:  We discussed her life time risk of breast cancer which is in the order of 20 to 30%.  2. We discussed different risk reducing strategies for future breast cancer risk. We discussed chemoprevention and lifestyle modification. We discussed role of ongoing surveillance with mammograms versus MRIs.  I do not believe she is a good candidate for chemoprevention given her young age and no immediate family history. We have however discussed about alternating MRIs with mammograms.  We have discussed about unknown long-term risk of gadolinium deposition with repeated MRIs and increased sensitivity of MRIs which may lead to additional biopsies.  3. Lifestyle modification: We discussed different interventions exercise at least 5 days a week including both aerobic as well as weight-bearing exercises and strength training. He discussed dietary modification including increasing number of servings of fruit and vegetables as well as decreased in number of servings of meat. We discussed the importance of maintaining a good BMI and limiting alcohol intake.  4. Surveillance: As mentioned above  6. 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  She already met with our genetic counselor.  She understands NCCN guidelines for rad 54 C mutation follow-up which include prophylactic salpingo-oophorectomy around the age of 38 to 46 years based on some current limited evidence-based as well as intensified breast cancer screening.  7. Followup: She will return to clinic once a year for breast exam.  I have ordered her next MRI.  She was encouraged to contact us with any new questions or concerns.  She will have to continue and follow-up with all her other specialists because of her other comorbidities and  symptoms  Thank you for consulting [place] the care of this patient.  Please not hesitate contact us with any additional questions or concerns.  HISTORY OF PRESENTING ILLNESS:  Alicia Carr 41 y.o. 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  This is a very pleasant 41 year old female patient with past medical history significant for primary sclerosing cholangitis, Possible Raynaud's who was initially following up with multiple specialist because she complained of some hyperpigmentation that she noted on her finger knuckles and some other random places.  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  She follows up with hepatology, endocrinology, rheumatology and her primary care doctor.  Besides the hyperpigmentation, she also reports some skin mottling and joint pains.  It appears that she was tried on multiple medications for autoimmune disease with no significant improvement.  She denies any immediate family history of breast cancer or ovarian cancer.  She remembers at third-degree relative on her paternal side with ovarian cancer.  She has 2 children.  She used to drink alcohol daily but for the past several months, she has decreased the consumption.  She had a mammogram recently in September which was without any concern for malignancy.  Rest of the pertinent 10 point ROS reviewed and negative   MEDICAL HISTORY:  Past Medical History:  Diagnosis Date   Alcoholism ([place])    Anemia    Early hepatic fibrosis    Family history of ovarian cancer    Family history of prostate cancer  Gallstones    History of chicken pox    Hyperpigmentation    Longitudinal melanonychia    Nevus    right dorsal wrist   Positive ANA (antinuclear antibody)    Primary sclerosing cholangitis    Pruritus    Raynaud's syndrome    Shingles  07/18/2020   Vaginal delivery 2003    SURGICAL HISTORY: Past Surgical History:  Procedure Laterality Date   CESAREAN SECTION  08/08/2006   CHOLECYSTECTOMY  2003   COLONOSCOPY  09/2020   LIVER BIOPSY  05/2020   ROBOTIC ASSISTED SALPINGO OOPHERECTOMY Bilateral 12/03/2021   Procedure: XI ROBOTIC ASSISTED BILATERAL SALPINGECTOMY AND RIGHT OOPHORECTOMY;  Surgeon: Will Bonnet, MD;  Location: [facility] ORS;  Service: Gynecology;  Laterality: Bilateral;   TUBAL LIGATION  2023   Tubes were actually removed due to cancer gene mutation    SOCIAL HISTORY: Social History   Socioeconomic History   Marital status: Married    Spouse name: Merrilee Seashore   Number of children: 2   Years of education: Not on file   Highest education level: Bachelor's degree (e.g., BA, AB, BS)  Occupational History   Occupation: Field seismologist  Tobacco Use   Smoking status: Never   Smokeless tobacco: Never  Vaping Use   Vaping Use: Never used  Substance and Sexual Activity   Alcohol use: Yes    Alcohol/week: 3.0 standard drinks of alcohol    Types: 3 Glasses of wine per week    Comment: Drinking more than special occasions but still limiting   Drug use: No   Sexual activity: Yes    Birth control/protection: None    Comment: My husband cannot have children  Other Topics Concern   Not on file  Social History Narrative   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, [name]   1 son born 2003 1 daughter born 2008   Alcohol use regular for 10+ years stopped 01/18/2020 was 2 to 3 glasses of wine daily   No drug use   Coffee and tea 2 or 3 a day for caffeine   Social Determinants of Radio broadcast assistant Strain: Not on file  Food Insecurity: Not on file  Transportation Needs: Not on file  Physical Activity: Not on file  Stress: Not on file  Social Connections: Not on file  Intimate Partner Violence: Not on file    FAMILY HISTORY: Family History  Problem Relation Age of  Onset   Mental illness Mother        paranoid schizophrenia   Hyperlipidemia Mother    Alcohol abuse Father    Colon polyps Father    Hypertension Father    Prostate cancer Father 39   Healthy Daughter    Asthma Daughter    Learning disabilities Daughter    ADD / ADHD Daughter    Anxiety disorder Daughter    Diabetes Paternal Aunt    Cancer Maternal Grandmother        uncertain type   Heart murmur Brother    Healthy Son    Ovarian cancer Other        PGF sister   Breast cancer Neg Hx     ALLERGIES:  has No Known Allergies.  MEDICATIONS:  Current Outpatient Medications  Medication Sig Dispense Refill   loteprednol (LOTEMAX) 0.5 % ophthalmic suspension Place 1 drop into the left eye daily.     VITAMIN D PO Take 1 tablet by mouth daily. (Patient not taking:  Reported on 12/14/2021)     No current facility-administered medications for this visit.     PHYSICAL EXAMINATION: ECOG PERFORMANCE STATUS: 0 - Asymptomatic  Vitals:   12/27/21 0956  BP: 122/78  Pulse: 86  Resp: 18  Temp: 98.1 F (36.7 C)  SpO2: 100%   Filed Weights   12/27/21 0956  Weight: 141 lb 9.6 oz (64.2 kg)    Physical Exam Constitutional:      Appearance: Normal appearance.  Chest:     Comments: Bilateral breasts inspected.  No palpable masses or regional adenopathy. Musculoskeletal:     Cervical back: Normal range of motion and neck supple. No rigidity.  Lymphadenopathy:     Cervical: No cervical adenopathy.  Skin:    Comments: Patient complained of hyperpigmentation but this is very faint and not very noticeable.  I did not find the classic livedo reticularis rash on her.  Neurological:     Mental Status: She is alert.      LABORATORY DATA:  I have reviewed the data as listed Lab Results  Component Value Date   WBC 7.0 12/17/2021   HGB 10.9 (L) 12/17/2021   HCT 33.9 (L) 12/17/2021   MCV 81.5 12/17/2021   PLT 269.0 12/17/2021     Chemistry      Component Value Date/Time   NA  136 12/14/2021 1354   NA 144 08/24/2016 0900   K 4.4 12/14/2021 1354   CL 102 12/14/2021 1354   CO2 23 12/14/2021 1354   BUN 12 12/14/2021 1354   BUN 17 08/24/2016 0900   CREATININE 0.77 12/14/2021 1354   CREATININE 0.82 12/02/2019 0823      Component Value Date/Time   CALCIUM 9.7 12/14/2021 1354   ALKPHOS 81 12/14/2021 1354   AST 28 12/14/2021 1354   ALT 19 12/14/2021 1354   BILITOT 0.4 12/14/2021 1354   BILITOT <0.2 12/21/2016 1222       RADIOGRAPHIC STUDIES: I have personally reviewed the radiological images as listed and agreed with the findings in the report. No results found.  All questions were answered. The patient knows to call the clinic with any problems, questions or concerns. I spent 45 minutes in the care of this patient including H and P, review of records, counseling and coordination of care.     Benay Pike, MD 12/27/2021 10:22 AM

## 2022-01-24 ENCOUNTER — Encounter: Payer: Self-pay | Admitting: Hematology and Oncology

## 2022-02-07 NOTE — Progress Notes (Unsigned)
Office Visit Note  [name]             Date of Birth: 04/13/1980           MRN: [id number]             PCP: Inda Coke, PA Referring: Inda Coke, PA Visit Date: 02/08/2022   Subjective:  No chief complaint on file.   History of Present Illness: Alicia Carr is a 40 y.o. 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 female here for follow up for undifferentiated connective tissue disease symptoms.  Unclear regarding relation of symptoms to underlying diagnosis of early primary sclerosing cholangitis.  She stopped taking the hydroxychloroquine for concern about a contributing to skin hyperpigmentation has not seen any change regarding this.  She did notice an increase in joint pains particularly around her knuckles and in bilateral knees.  This is not debilitating or requiring her to take additional over-the-counter medication to manage.  Raynaud's symptoms have been doing better and she thinks is associated with the recent warmer weather.  Repeat lab follow-up with normal liver function test.  She followed up with Dr. Carlean Purl regarding concerns and has been referred to see a geneticist upcoming appointment on September 6.   Previous HPI 08/02/2021 Alicia Carr is a 41 y.o. female here for follow up for raynaud's symptoms, currently taking plaquenil 200 mg by mouth once daily. Since our last visit she feels the joint pain and stiffness improved from before. She also has less raynaud's symptoms than before, although this is not unusual during warmer time of the year. She continues having the small areas of twitching from before and saw Dr. Leta Baptist for this with workup including broad screening antibody testing and imaging of cervical and thoracic spine without major abnormalities. She reports progression of  skin hyperpigmentation changes affecting her in multiple areas in patches and also some isolated small hyperpigmented spots on her face and palms and longitudinal changes in her fingernails. She had a nail biopsy consistent with melanotic macule. She saw Marella Chimes for this problem who had some concern about possible autoimmune vs genetic or proliferative causes. Symptoms started before exposure to hydroxychloroquine but also concern for exacerbating symptoms. She has a lot of healthcare related anxiety surrounding this problem, worse after a coworker passed unexpected from metastatic pancreatic cancer recently. She is drinking alcohol more frequently now and has started to gain weight and feels this is from depressed mood.   Previous HPI 05/03/2021 Alicia Carr is a 41 y.o. female here for follow up for raynaud's symptoms after starting sildenafil 20 mg. She continued noticing finger discoloration and rashes. She is also concerned with noticing some hyperpigmentation on the palms of her hands and also darkening of her gums. Still having some twitching and aches and overall concerned she is not feeling well. She had a mild amount of leg swelling intermittently. She is concerned about if her symptoms could be an adrenal or hormonal issue with ongoing skin changes and generalized symptoms.   Previous HPI 04/07/21 Alicia Carr is a 41 y.o. female here for follow up with numerous different symptoms ongoing some in exacerbation.  So far addition of the amlodipine has not significantly helped her finger or toe discoloration is actually having more blue and violaceous changes than before in the fingers.  She feels extremely cold much of the time somewhat regardless of the ambient temperature.  Chronic persistent fatigue remains her most significant issue.  She also continues having muscle twitching or inability to fully close or extend for periods of time the seem to come and go without trigger.  She has some  hand swelling no but no increase in leg swelling.  She was feeling some palpitations and wore a heart monitor apparently this showed some episodes of moderate tachycardia but without concerning arrhythmias seen.   Previous HPI 10/06/20 Alicia Carr is a 41 y.o. female here with joint pains and numbness affecting bilateral fingers and toes that is worse especially in the past few months.  She developed shingles outbreak on the face and left eye in May this is treated and symptoms have improved besides some residual hyperpigmentation.  However also slightly earlier this year was recently diagnosed with [place] by biopsy during evaluation of abdominal symptoms and abnormal liver function tests.  She reports morning stiffness difficulty gripping her hands tightly or performing tasks first thing in the morning this improves some time and is normal by the afternoon.  She notices occasional swelling or puffiness especially in the feet but not all the time.  She denies discoloration such as pallor, erythema, or cyanosis.  She tried taking oral diclofenac for joint pains but developed an episode of painless hematochezia so stopped this medication. She denies any lymphadenopathy, photosensitive rashes, or history of blood clots.   No Rheumatology ROS completed.   PMFS History:  Patient Active Problem List   Diagnosis Date Noted   Right ovarian cyst 75/91/6384   Monoallelic mutation of [id number] gene 11/30/2021   Genetic testing 11/29/2021   Family history of prostate cancer 11/04/2021   Family history of ovarian cancer 11/04/2021   Intolerant of cold 08/02/2021   Menorrhagia 57/03/7791   Folliculitis 90/30/0923   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  cause 06/18/2020    Past Medical History:  Diagnosis Date   Alcoholism ([place])    Anemia    Early hepatic fibrosis    Family history of ovarian cancer    Family history of prostate cancer    Gallstones    History of chicken pox    Hyperpigmentation    Longitudinal melanonychia    Nevus    right dorsal wrist   Positive ANA (antinuclear antibody)    Primary sclerosing cholangitis    Pruritus    Raynaud's syndrome    Shingles 07/18/2020   Vaginal delivery 2003    Family History  Problem Relation Age of Onset   Mental illness Mother        paranoid schizophrenia   Hyperlipidemia Mother    Alcohol abuse Father    Colon polyps Father    Hypertension Father    Prostate cancer Father 56   Healthy Daughter    Asthma Daughter    Learning disabilities Daughter    ADD / ADHD Daughter    Anxiety disorder Daughter    Diabetes Paternal Aunt    Cancer Maternal Grandmother        uncertain type   Heart murmur Brother    Healthy Son    Ovarian cancer Other        PGF sister   Breast cancer  Neg Hx    Past Surgical History:  Procedure Laterality Date   CESAREAN SECTION  08/08/2006   CHOLECYSTECTOMY  2003   COLONOSCOPY  09/2020   LIVER BIOPSY  05/2020   ROBOTIC ASSISTED SALPINGO OOPHERECTOMY Bilateral 12/03/2021   Procedure: XI ROBOTIC ASSISTED BILATERAL SALPINGECTOMY AND RIGHT OOPHORECTOMY;  Surgeon: Will Bonnet, MD;  Location: [facility] ORS;  Service: Gynecology;  Laterality: Bilateral;   TUBAL LIGATION  2023   Tubes were actually removed due to cancer gene mutation   Social History   Social History Narrative   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, [name]   1 son born 2003 1 daughter born 2008   Alcohol use regular for 10+ years stopped 01/18/2020 was 2 to 3 glasses of wine daily   No drug use   Coffee and tea 2 or 3 a day for caffeine   Immunization History  Administered Date(s) Administered   Hep A / Hep B 09/08/2020, 10/12/2020   Hepatitis  A, Adult 03/12/2021   Hepatitis B, PED/ADOLESCENT 03/12/2021, 03/12/2021   Influenza,inj,Quad PF,6+ Mos 12/26/2018, 11/29/2019, 12/03/2020, 12/14/2021   Influenza,inj,quad, With Preservative 11/29/2019   PFIZER(Purple Top)SARS-COV-2 Vaccination 05/02/2019, 05/23/2019, 01/03/2020, 10/06/2020   Tdap 05/10/2006, 11/29/2019     Objective: Vital Signs: There were no vitals taken for this visit.   Physical Exam   Musculoskeletal Exam: ***  CDAI Exam: CDAI Score: -- Patient Global: --; Provider Global: -- Swollen: --; Tender: -- Joint Exam 02/08/2022   No joint exam has been documented for this visit   There is currently no information documented on the homunculus. Go to the Rheumatology activity and complete the homunculus joint exam.  Investigation: No additional findings.  Imaging: No results found.  Recent Labs: Lab Results  Component Value Date   WBC 7.0 12/17/2021   HGB 10.9 (L) 12/17/2021   PLT 269.0 12/17/2021   NA 136 12/14/2021   K 4.4 12/14/2021   CL 102 12/14/2021   CO2 23 12/14/2021   GLUCOSE 83 12/14/2021   BUN 12 12/14/2021   CREATININE 0.77 12/14/2021   BILITOT 0.4 12/14/2021   ALKPHOS 81 12/14/2021   AST 28 12/14/2021   ALT 19 12/14/2021   PROT 7.7 12/14/2021   ALBUMIN 4.7 12/14/2021   CALCIUM 9.7 12/14/2021   GFRAA 95 08/24/2016   QFTBGOLDPLUS NEGATIVE 07/23/2020    Speciality Comments: PLQ Eye Exam normal 09/17/2021 Jomarie Longs, OD f/u 6 months  Procedures:  No procedures performed Allergies: Patient has no known allergies.   Assessment / Plan:     Visit Diagnoses: No diagnosis found.  ***  Orders: No orders of the defined types were placed in this encounter.  No orders of the defined types were placed in this encounter.    Follow-Up Instructions: No follow-ups on file.   Collier Salina, MD  Note - This record has been created using [employer].  Chart creation errors have been sought, but may not always  have been  located. Such creation errors do not reflect on  the standard of medical care.

## 2022-02-08 ENCOUNTER — Ambulatory Visit: Payer: Managed Care, Other (non HMO) | Attending: Internal Medicine | Admitting: Internal Medicine

## 2022-02-08 ENCOUNTER — Encounter: Payer: Self-pay | Admitting: Internal Medicine

## 2022-02-08 VITALS — BP 109/70 | HR 92 | Resp 15 | Ht 60.75 in | Wt 145.0 lb

## 2022-02-08 DIAGNOSIS — M2550 Pain in unspecified joint: Secondary | ICD-10-CM

## 2022-02-08 DIAGNOSIS — K8301 Primary sclerosing cholangitis: Secondary | ICD-10-CM | POA: Diagnosis not present

## 2022-02-08 DIAGNOSIS — R768 Other specified abnormal immunological findings in serum: Secondary | ICD-10-CM | POA: Diagnosis not present

## 2022-02-08 DIAGNOSIS — I7300 Raynaud's syndrome without gangrene: Secondary | ICD-10-CM | POA: Diagnosis not present

## 2022-02-08 MED ORDER — HYDROXYCHLOROQUINE SULFATE 200 MG PO TABS: 200.0000 mg | ORAL_TABLET | Freq: Every day | ORAL | 2 refills | Status: DC

## 2022-02-10 ENCOUNTER — Ambulatory Visit (INDEPENDENT_AMBULATORY_CARE_PROVIDER_SITE_OTHER): Payer: Managed Care, Other (non HMO) | Admitting: Internal Medicine

## 2022-02-10 ENCOUNTER — Encounter: Payer: Self-pay | Admitting: Internal Medicine

## 2022-02-10 VITALS — BP 108/62 | HR 74 | Ht 63.25 in | Wt 149.0 lb

## 2022-02-10 DIAGNOSIS — L290 Pruritus ani: Secondary | ICD-10-CM

## 2022-02-10 DIAGNOSIS — K644 Residual hemorrhoidal skin tags: Secondary | ICD-10-CM | POA: Diagnosis not present

## 2022-02-10 LAB — ANA: Anti Nuclear Antibody (ANA): POSITIVE — AB

## 2022-02-10 LAB — ANTI-NUCLEAR AB-TITER (ANA TITER)
ANA TITER: 1:80 {titer} — ABNORMAL HIGH
ANA Titer 1: 1:80 {titer} — ABNORMAL HIGH

## 2022-02-10 LAB — RNP ANTIBODY: Ribonucleic Protein(ENA) Antibody, IgG: <1 AI

## 2022-02-10 LAB — C3 AND C4
C3 Complement: 149 mg/dL (ref 83–193)
C4 Complement: 30 mg/dL (ref 15–57)

## 2022-02-10 LAB — C-REACTIVE PROTEIN: CRP: 0.8 mg/L (ref <8.0)

## 2022-02-10 LAB — ANTI-DNA ANTIBODY, DOUBLE-STRANDED: ds DNA Ab: 4 IU/mL

## 2022-02-10 LAB — SJOGRENS SYNDROME-A EXTRACTABLE NUCLEAR ANTIBODY: SSA (Ro) (ENA) Antibody, IgG: <1 AI

## 2022-02-10 LAB — SEDIMENTATION RATE: Sed Rate: 14 mm/h (ref 0–20)

## 2022-02-10 MED ORDER — HYDROCORTISONE (PERIANAL) 2.5 % EX CREA: 1.0000 | TOPICAL_CREAM | Freq: Two times a day (BID) | CUTANEOUS | 1 refills | Status: DC

## 2022-02-10 NOTE — Patient Instructions (Signed)
We have sent the following medications to your pharmacy for you to pick up at your convenience: Anusol cream  Take a tablespoon of Benefiber daily.   Purchase over the counter Recticare and use it as needed.   I appreciate the opportunity to care for you. Silvano Rusk ,MD

## 2022-02-10 NOTE — Progress Notes (Signed)
Alicia Carr 41 y.o. April 03, 1980 [id number]  Assessment & Plan:   Encounter Diagnoses  Name Primary?   External hemorrhoids Yes   Pruritus ani     Treat with hydrocortisone cream twice daily for 7 days and then as needed, as needed 5% lidocaine topical cream also.  Benefiber 1 tablespoon daily.  Follow-up as needed.   Subjective:   Chief Complaint: Itching hemorrhoids  HPI 41 year old African-American woman with PSC and positive ANA who presents today with anal itching and symptomatic hemorrhoids she suspects.  Defecation pattern is that of daily bowel movements though sometimes as an urge but is unable to produce a stool.  There are some harder stools at times and sometimes stools are very loose.  She cannot tell a pattern or trigger.  She is complaining of itching after defecation and at other times during the day.  She has tried Preparation H on external hemorrhoids but that has not helped.  Not describing bleeding.  Colonoscopy in 2022 revealed external hemorrhoids otherwise normal.  She reports she has continued to take steroid eyedrops after herpes zoster issue that was periorbital question ocular.  Followed by ophthalmology.  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  Followed at [facility] care.  History of alcohol abuse also.  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  Colonoscopy for hematochezia 2022 external hemorrhoids  She has a longitudinal melanonychia issue and some hyperpigmentation of the skin and the question of Peutz-Jeghers syndrome had been raised but I did not think further endoscopic evaluation was necessary and genetics concurred.  She does have a mutation and increased risk of breast cancer and is followed by oncology now.   No Known Allergies Current Meds  Medication Sig   hydroxychloroquine (PLAQUENIL) 200 MG tablet Take 1 tablet (200 mg total) by mouth  daily.   loteprednol (LOTEMAX) 0.5 % ophthalmic suspension Place 1 drop into the left eye daily.   Past Medical History:  Diagnosis Date   Alcoholism ([place])    Anemia    Early hepatic fibrosis    Family history of ovarian cancer    Family history of prostate cancer    Gallstones    Genetic testing 11/29/2021   History of chicken pox    Hyperpigmentation    Longitudinal melanonychia    Nevus    right dorsal wrist   Positive ANA (antinuclear antibody)    Primary sclerosing cholangitis    Pruritus    Raynaud's syndrome    Shingles 07/18/2020   Vaginal delivery 2003   Past Surgical History:  Procedure Laterality Date   CESAREAN SECTION  08/08/2006   CHOLECYSTECTOMY  2003   COLONOSCOPY  09/2020   LIVER BIOPSY  05/2020   ROBOTIC ASSISTED SALPINGO OOPHERECTOMY Bilateral 12/03/2021   Procedure: XI ROBOTIC ASSISTED BILATERAL SALPINGECTOMY AND RIGHT OOPHORECTOMY;  Surgeon: Will Bonnet, MD;  Location: [facility] ORS;  Service: Gynecology;  Laterality: Bilateral;   TUBAL LIGATION  2023   Tubes were actually removed due to cancer gene mutation   Social History   Social History Narrative   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, [name]   1 son born 2003 1 daughter born 2008   Alcohol use regular for 10+ years stopped 01/18/2020 was 2 to 3 glasses of wine daily   No drug use   Coffee and tea 2 or 3 a day for caffeine  family history includes ADD / ADHD in her daughter; Alcohol abuse in her father; Anxiety disorder in her daughter; Asthma in her daughter; Cancer in her maternal grandmother; Colon polyps in her father; Diabetes in her paternal aunt; Healthy in her daughter and son; Heart murmur in her brother; Hyperlipidemia in her mother; Hypertension in her father; Learning disabilities in her daughter; Mental illness in her mother; Ovarian cancer in an other family member; Prostate cancer (age of onset: 60) in her father.   Review of Systems As per  HPI  Objective:   Physical Exam BP 108/62   Pulse 74   Ht 5' 3.25" (1.607 m)   Wt 149 lb (67.6 kg)   BMI 26.19 kg/m  Patti Martinique, CMA present. Exam of the anoderm shows some small external hemorrhoids/tags that were fleshy.  Very soft.  There is no dermatitis.  Digital exam is normal with no mass formed brown stool nontender no sign of fissure.

## 2022-02-14 ENCOUNTER — Emergency Department (HOSPITAL_COMMUNITY)
Admission: EM | Admit: 2022-02-14 | Discharge: 2022-02-15 | Disposition: A | Discharge status: Home / Self Care | Payer: Managed Care, Other (non HMO) | Attending: Emergency Medicine | Admitting: Emergency Medicine

## 2022-02-14 ENCOUNTER — Other Ambulatory Visit: Payer: Self-pay

## 2022-02-14 ENCOUNTER — Telehealth: Payer: Self-pay | Admitting: *Deleted

## 2022-02-14 DIAGNOSIS — M25512 Pain in left shoulder: Secondary | ICD-10-CM | POA: Diagnosis present

## 2022-02-14 DIAGNOSIS — L819 Disorder of pigmentation, unspecified: Secondary | ICD-10-CM

## 2022-02-14 DIAGNOSIS — I7300 Raynaud's syndrome without gangrene: Secondary | ICD-10-CM | POA: Diagnosis not present

## 2022-02-14 DIAGNOSIS — M7989 Other specified soft tissue disorders: Secondary | ICD-10-CM | POA: Diagnosis not present

## 2022-02-14 LAB — COMPREHENSIVE METABOLIC PANEL
ALT: 16 U/L (ref 0–44)
AST: 24 U/L (ref 15–41)
Albumin: 3.9 g/dL (ref 3.5–5.0)
Alkaline Phosphatase: 64 U/L (ref 38–126)
Anion gap: 9 (ref 5–15)
BUN: 13 mg/dL (ref 6–20)
CO2: 24 mmol/L (ref 22–32)
Calcium: 9 mg/dL (ref 8.9–10.3)
Chloride: 104 mmol/L (ref 98–111)
Creatinine, Ser: 0.88 mg/dL (ref 0.44–1.00)
GFR, Estimated: >60 mL/min (ref >60)
Glucose, Bld: 90 mg/dL (ref 70–99)
Potassium: 4.4 mmol/L (ref 3.5–5.1)
Sodium: 137 mmol/L (ref 135–145)
Total Bilirubin: 0.6 mg/dL (ref 0.3–1.2)
Total Protein: 7.3 g/dL (ref 6.5–8.1)

## 2022-02-14 LAB — CBC WITH DIFFERENTIAL/PLATELET
Abs Immature Granulocytes: 0.02 10*3/uL (ref 0.00–0.07)
Basophils Absolute: 0 10*3/uL (ref 0.0–0.1)
Basophils Relative: 0 %
Eosinophils Absolute: 0.1 10*3/uL (ref 0.0–0.5)
Eosinophils Relative: 1 %
HCT: 38.1 % (ref 36.0–46.0)
Hemoglobin: 11.9 g/dL — ABNORMAL LOW (ref 12.0–15.0)
Immature Granulocytes: 0 %
Lymphocytes Relative: 33 %
Lymphs Abs: 2.5 10*3/uL (ref 0.7–4.0)
MCH: 25.9 pg — ABNORMAL LOW (ref 26.0–34.0)
MCHC: 31.2 g/dL (ref 30.0–36.0)
MCV: 82.8 fL (ref 80.0–100.0)
Monocytes Absolute: 0.5 10*3/uL (ref 0.1–1.0)
Monocytes Relative: 7 %
Neutro Abs: 4.6 10*3/uL (ref 1.7–7.7)
Neutrophils Relative %: 59 %
Platelets: 270 10*3/uL (ref 150–400)
RBC: 4.6 MIL/uL (ref 3.87–5.11)
RDW: 14.6 % (ref 11.5–15.5)
WBC: 7.8 10*3/uL (ref 4.0–10.5)
nRBC: 0 % (ref 0.0–0.2)

## 2022-02-14 NOTE — Telephone Encounter (Signed)
-----   Message from Collier Salina, MD sent at 02/14/2022 12:49 AM EST ----- ANA remains positive at same titer and pattern as before, none of the specific antibody tests positive. Inflammatory markers are also normal. I don't have any more specific diagnosis than before based on these results. If interested we can refer her  to dermatology for another opinion on symptoms.

## 2022-02-14 NOTE — ED Triage Notes (Signed)
Patient reports left shoulder/left upper arm pain this evening , denies injury . Patient also concerned of left finger nails are blue . Respirations unlabored.

## 2022-02-14 NOTE — ED Provider Triage Note (Signed)
Emergency Medicine Provider Triage Evaluation Note  Alicia Carr , a 41 y.o. female  was evaluated in triage.  Pt complains of left arm numbness and tingling.  She reports that she first noticed this sensation when she was driving and it happened 2 times.  She believes that the pain originates on the left side of chest and moves into left arm.  Denies any prior history of cardiovascular disease or heart arrhythmias.  Not currently on any antihypertensives or anticoagulants.  Denies nausea, vomiting, headache, shortness of breath, abdominal pain, diarrhea..  Review of Systems  Positive: As above Negative: As above  Physical Exam  BP 129/88 (BP Location: Right Arm)   Pulse 78   Temp 98.2 F (36.8 C) (Oral)   Resp 20   LMP 02/03/2022   SpO2 100%  Gen:   Awake, no distress Resp:  Normal effort clear to auscultation bilaterally MSK:   Moves extremities without difficulty symmetrical strength when comparing right to left upper extremities Other:  Neurovascularly intact in the upper extremities.  Medical Decision Making  Medically screening exam initiated at 9:14 PM.  Appropriate orders placed.  Alicia Carr was informed that the remainder of the evaluation will be completed by another provider, this initial triage assessment does not replace that evaluation, and the importance of remaining in the ED until their evaluation is complete.     Alicia Heller, PA-C 02/14/22 2115

## 2022-02-14 NOTE — Progress Notes (Signed)
ANA remains positive at same titer and pattern as before, none of the specific antibody tests positive. Inflammatory markers are also normal. I don't have any more specific diagnosis than before based on these results. If interested we can refer her to dermatology for another opinion on symptoms.

## 2022-02-15 ENCOUNTER — Emergency Department (HOSPITAL_BASED_OUTPATIENT_CLINIC_OR_DEPARTMENT_OTHER): Payer: Managed Care, Other (non HMO)

## 2022-02-15 DIAGNOSIS — R52 Pain, unspecified: Secondary | ICD-10-CM

## 2022-02-15 DIAGNOSIS — R202 Paresthesia of skin: Secondary | ICD-10-CM | POA: Diagnosis not present

## 2022-02-15 LAB — TROPONIN I (HIGH SENSITIVITY)
Troponin I (High Sensitivity): 3 ng/L (ref <18)
Troponin I (High Sensitivity): <2 ng/L (ref <18)

## 2022-02-15 NOTE — Progress Notes (Signed)
LUE arterial duplex has been completed.  Attempted to call triage with preliminary results - no answer.  Messaged preliminary results to Lourdes Sledge, PA-C.    Results can be found under chart review under CV PROC. 02/15/2022 3:21 PM Tung Pustejovsky RVT, RDMS

## 2022-02-15 NOTE — Discharge Instructions (Signed)
You were evaluated in the emergency department for left shoulder/arm pain.  Overall your workup today was nonspecific, without 1 clear finding.  Imaging was generally reassuring, without evidence of blockage or occlusion.  Your EKG was unremarkable and your heart enzymes were not elevated.  Please follow up with your rheumatologist and PCP for reevaluation and continued medical management.  Continue taking your medications as prescribed.  Return to the ED for new or worsening symptoms as discussed.

## 2022-02-15 NOTE — ED Provider Notes (Signed)
[name] DEPARTMENT Provider Note   CSN: [id number] Arrival date & time: 02/14/22  1858     History  Chief Complaint  Patient presents with   Left Shoulder/Left Arm pain     Alicia Carr is a 41 y.o. female with Hx of Raynaud's, primary sclerosing cholangitis, cold intolerance, alcoholism, and anemia presenting to the ED today due to complaints of left upper arm/shoulder pain.  States she was working late last night when she noticed about 10 seconds of sudden sharp pain in the left shoulder that radiated into her arm.  Does not recall having this happen before.  Then on her way home, noticed another episode of this.  Both episodes relieved on their own.  Also noticed a blue tinge to some of her fingernails on the left hand.  Reports chronic significant cold intolerance, stating her fingers legs and toes often turn bluish-purple when she feels cold.  Does not report chest pain, shortness of breath, chest heaviness or tightness, fevers, changes in bowel habits, headache, vision changes, recent injury, URI symptoms, or N/V/D.  Again states the pain did not come from the chest region, however was concerned the pain from the shoulder/arm   This provider was able to first interview/evaluate her after 21 to 22 hours in the emergency department waiting room.  Patient states she had 1 more episode of 5 to 10 seconds of the same pain upon arrival, has been without it since.  Denies new numbness, tingling, or weakness of the extremities.  Notes chronic leg pain and numbness that occurs with cold temperatures and sitting for long periods.  Has been followed extensively by rheumatology, and has tried several different modalities of recommended treatment without much improvement.  Currently restarted Plaquenil 1 week ago for joint pains, though states she was informed this does not always help with Raynaud's.  The history is provided by the patient and medical records.       Home Medications Prior to Admission medications   Medication Sig Start Date End Date Taking? Authorizing Provider  hydrocortisone (ANUSOL-HC) 2.5 % rectal cream Place 1 Application rectally 2 (two) times daily. For 1 week then prn 02/10/22   Gatha Mayer, MD  hydroxychloroquine (PLAQUENIL) 200 MG tablet Take 1 tablet (200 mg total) by mouth daily. 02/08/22   Rice, Resa Miner, MD  loteprednol (LOTEMAX) 0.5 % ophthalmic suspension Place 1 drop into the left eye daily. 02/03/21   [provider]      Allergies    Patient has no known allergies.    Review of Systems   Review of Systems  Musculoskeletal:        Left shoulder/arm pain    Physical Exam Updated Vital Signs BP 122/74 (BP Location: Left Arm)   Pulse 80   Temp 98.8 F (37.1 C)   Resp 20   LMP 02/03/2022   SpO2 100%  Physical Exam Vitals and nursing note reviewed.  Constitutional:      General: She is not in acute distress.    Appearance: She is well-developed. She is not ill-appearing, toxic-appearing or diaphoretic.  HENT:     Head: Normocephalic and atraumatic.     Right Ear: External ear normal.     Left Ear: External ear normal.     Nose: Nose normal.     Mouth/Throat:     Mouth: Mucous membranes are moist.     Pharynx: Oropharynx is clear.  Eyes:     General: No  scleral icterus.    Extraocular Movements: Extraocular movements intact.     Conjunctiva/sclera: Conjunctivae normal.     Pupils: Pupils are equal, round, and reactive to light.  Neck:     Comments: No meningismus or torticollis Cardiovascular:     Rate and Rhythm: Normal rate and regular rhythm.     Pulses: Normal pulses.     Heart sounds: Normal heart sounds. No murmur heard.    Comments: Radial, DP, and PT pulses 2+ bilaterally Pulmonary:     Effort: Pulmonary effort is normal. No respiratory distress.     Breath sounds: Normal breath sounds. No stridor. No wheezing, rhonchi or rales.     Comments: CTAB, communicating  without difficulty, without increased respiratory effort.  Equal chest rise.  No tachypnea, accessory muscle usage, retractions, or decreased breath sounds.  Adequate air movement appreciated. Chest:     Chest wall: No tenderness.     Comments: Non-TTP, no crepitus, deformity, or masses. Abdominal:     General: There is no distension.     Palpations: Abdomen is soft.     Tenderness: There is no abdominal tenderness. There is no right CVA tenderness, left CVA tenderness or guarding.  Musculoskeletal:        General: No swelling, tenderness or deformity.     Cervical back: Neck supple. No rigidity.     Right lower leg: No edema.     Left lower leg: No edema.     Comments: ROM, strength, and coordination of upper and lower extremities appears grossly intact.  Compartments soft.  No bony tenderness of the left shoulder joint, humerus, or clavicle.  Skin:    General: Skin is warm and dry.     Capillary Refill: Capillary refill takes less than 2 seconds.     Coloration: Skin is not jaundiced or pale.     Findings: No bruising, erythema or rash.     Comments: Hands and arms mildly cool to touch, hands more than arms, at baseline per pt. No bluish discoloration/hue of the fingernails appreciated.  Neurological:     Mental Status: She is alert and oriented to person, place, and time. Mental status is at baseline.     Sensory: No sensory deficit.     Motor: No weakness.     Coordination: Coordination normal.  Psychiatric:        Mood and Affect: Mood normal.     ED Results / Procedures / Treatments   Labs (all labs ordered are listed, but only abnormal results are displayed) Labs Reviewed  CBC WITH DIFFERENTIAL/PLATELET - Abnormal; Notable for the following components:      Result Value   Hemoglobin 11.9 (*)    MCH 25.9 (*)    All other components within normal limits  COMPREHENSIVE METABOLIC PANEL  TROPONIN I (HIGH SENSITIVITY)  TROPONIN I (HIGH SENSITIVITY)    EKG EKG  Interpretation  Date/Time:  Monday February 14 2022 21:28:14 EST Ventricular Rate:  80 PR Interval:  116 QRS Duration: 68 QT Interval:  378 QTC Calculation: 435 R Axis:   53 Text Interpretation: Normal sinus rhythm with sinus arrhythmia Normal ECG No previous ECGs available Confirmed by Aletta Edouard [phone]) on 02/15/2022 4:23:38 PM  Radiology VAS [place] UPPER EXTREMITY ARTERIAL DUPLEX  Result Date: 02/15/2022  UPPER EXTREMITY DUPLEX STUDY Patient Name:  Alicia Carr  Date of Exam:   02/15/2022 Medical Rec #: [id number]          Accession #:  [id number] Date of Birth: Jun 23, 1980          Patient Gender: F Patient Age:   32 years Exam Location:  [facility] Procedure:      VAS [place] UPPER EXTREMITY ARTERIAL DUPLEX Referring Phys: Lourdes Sledge --------------------------------------------------------------------------------  Indications: Numbness, tingling, pain. History:     Patient has a history of Raynaud's.  Performing Technologist: Rogelia Rohrer RVT/RDMS  Examination Guidelines: A complete evaluation includes B-mode imaging, spectral Doppler, color Doppler, and power Doppler as needed of all accessible portions of each vessel. Bilateral testing is considered an integral part of a complete examination. Limited examinations for reoccurring indications may be performed as noted.  Left Doppler Findings: +---------------+----------+---------+--------+--------+ Site           PSV (cm/s)Waveform StenosisComments +---------------+----------+---------+--------+--------+ Subclavian Prox76        triphasic                 +---------------+----------+---------+--------+--------+ Subclavian Mid 93        triphasic                 +---------------+----------+---------+--------+--------+ Axillary       101       triphasic                 +---------------+----------+---------+--------+--------+ Brachial Prox  100       triphasic                  +---------------+----------+---------+--------+--------+ Brachial Dist  73        triphasic                 +---------------+----------+---------+--------+--------+ Radial Prox    38        triphasic                 +---------------+----------+---------+--------+--------+ Radial Mid     52        triphasic                 +---------------+----------+---------+--------+--------+ Radial Dist    54        triphasic                 +---------------+----------+---------+--------+--------+ Ulnar Prox     24        triphasic                 +---------------+----------+---------+--------+--------+ Ulnar Mid      34        triphasic                 +---------------+----------+---------+--------+--------+ Ulnar Dist     36        triphasic                 +---------------+----------+---------+--------+--------+ PPGs of left hand, (most notably the 2nd and 5th digits) appear dampened with loss of dicrotic notch  Electronically signed by Servando Snare MD on 02/15/2022 at 4:14:22 PM.    Final     Procedures Procedures    Medications Ordered in ED Medications - No data to display  ED Course/ Medical Decision Making/ A&P                           Medical Decision Making  41 y.o. female presents to the ED for concern of Left Shoulder/Left Arm pain    This involves an extensive number of treatment options, and is a complaint that carries with it a high risk of complications and morbidity.  The emergent differential diagnosis prior to evaluation includes, but is not limited to: arterial occlusion, compartment syndrome, cellulitis, muscle spasm  This is not an exhaustive differential.   Past Medical History / Co-morbidities / Social History: Hx of Raynaud's, primary sclerosing cholangitis, cold intolerance, alcoholism, and anemia Social Determinants of Health include: None  Additional History:  Obtained by chart review.  Notably prior rheumatology visits, see for  details. Dr. Benjamine Mola 02/08/22 of Rheumatology: "With cooler weather she is noticing blue discoloration in her fingers more frequently she is not sure if this is the Raynaud's specifically or just general cold intolerance. " "Still suspected symptoms of a ongoing connective tissue disease with no specific clinical syndrome really identified so far.  Will recheck laboratory workup most recent was in August 2022 with AVISE panel.  Will recheck the ANA as well as SSA RNP double-stranded DNA antibodies.  Rechecking serum complements sedimentation rate CRP for inflammatory disease activity assessment.  If workup remains very negative or nonspecific could consider additional dermatology opinion. " "Raynaud's syndrome without gangrene.Marland KitchenMarland KitchenNo ischemic injuries or complications still having distal finger cyanosis regularly.  She never had great clinical improvement on amlodipine or sildenafil to recommend just conservative treatment with cold avoidance."  Lab Tests: I ordered, and personally interpreted labs.  The pertinent results include:   Troponin <2, 3 CMP without electrolyte derangement, AKI, or abnormal LFTs.  Glucose 90 CBC without evidence of significant anemia, noted anemia at baseline.  Imaging Studies: I ordered imaging studies including US arterial duplex left upper extremity.   I independently visualized and interpreted imaging which showed no evidence of occlusion, though with very mild dampened PPG's of the second and fifth digits likely related to known Raynaud's I agree with the radiologist interpretation.  ED Course: Unfortunately unable to assess patient until 21-22 hours after arrival due to resources/capacity.   Pt well-appearing on exam.  Nontoxic, nonseptic appearing in NAD.  Afebrile.  Sitting comfortably.  Presenting with 2-3 episodes of 5 to 10 seconds sharp left shoulder pain radiating into the left arm.  Denies chest pain or discomfort to this provider.  Further states she was  concerned the pain in the arm/shoulder could mean she was having a heart attack.  Also complaint of slight bluish hue to nails, which were not appreciated on this provider's exam.  Upper extremities and lower extremities appear neurovascularly intact without significant discoloration, swelling, or evidence of ischemia.  Compartments are soft.  Without clinical evidence of DVT or cellulitis.  Extremities are nontender on palpation.  Without bony tenderness, deformity, or malalignment/shortening.  Doubt acute fracture or dislocation.  Coordination, ROM, and strength appear grossly intact of extremities.  Imaging without evidence of arterial occlusion or obstruction.  Mild findings of dampening in two digits, likely related to Raynaud's disease.  No new symptoms of lower extremities.  LFTs unremarkable.  No evidence of malignant HTN, myocardial infarction, respiratory failure, gangrene, or renal crisis.  No recent URI symptoms.  Low clinical suspicion for ACS, GERD, or pneumonia based on history, physical exam and findings.   Overall, I am uncertain the exact etiology of the patient's symptoms.  However, I do not believe she is currently experiencing a medical, surgical, or psychiatric emergency.  Pt has continued to remain clinically stale throughout encounter.  Recommend close follow-up with rheumatology/PCP.  Strict return precautions discussed.  Patient in NAD and in good condition at time of discharge.  Disposition: After consideration the patient's encounter today, I do not feel today's workup suggests  an emergent condition requiring admission or immediate intervention beyond what has been performed at this time.  Safe for discharge; instructed to return immediately for worsening symptoms, change in symptoms or any other concerns.  I have reviewed the patients home medicines and have made adjustments as needed.  Discussed course of treatment with the patient, whom demonstrated understanding.  Patient in  agreement and has no further questions.    I discussed this case with my attending physician Dr. Melina Copa, who agreed with the proposed treatment course and cosigned this note.  Attending physician stated agreement with plan or made changes to plan which were implemented.    This chart was dictated using voice recognition software.  Despite best efforts to proofread, errors can occur which can change the documentation meaning.         Final Clinical Impression(s) / ED Diagnoses Final diagnoses:  Acute pain of left shoulder  Raynaud's disease without gangrene    Rx / DC Orders ED Discharge Orders     None         Candace Cruise 62/44/69 1633    Hayden Rasmussen, MD 02/18/22 1048

## 2022-03-06 ENCOUNTER — Encounter: Payer: Self-pay | Admitting: Physician Assistant

## 2022-03-06 ENCOUNTER — Encounter: Payer: Self-pay | Admitting: Internal Medicine

## 2022-03-14 ENCOUNTER — Other Ambulatory Visit: Payer: Self-pay | Admitting: *Deleted

## 2022-03-14 DIAGNOSIS — R768 Other specified abnormal immunological findings in serum: Secondary | ICD-10-CM

## 2022-03-14 DIAGNOSIS — K8301 Primary sclerosing cholangitis: Secondary | ICD-10-CM

## 2022-03-14 DIAGNOSIS — M2550 Pain in unspecified joint: Secondary | ICD-10-CM

## 2022-03-14 DIAGNOSIS — L819 Disorder of pigmentation, unspecified: Secondary | ICD-10-CM

## 2022-03-14 DIAGNOSIS — I7300 Raynaud's syndrome without gangrene: Secondary | ICD-10-CM

## 2022-03-14 DIAGNOSIS — M79641 Pain in right hand: Secondary | ICD-10-CM

## 2022-03-14 NOTE — Telephone Encounter (Signed)
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  Also I think we referred to [facility] for Dr. Sharol Roussel regarding her skin rashes.

## 2022-03-29 ENCOUNTER — Encounter: Payer: Self-pay | Admitting: Physician Assistant

## 2022-04-19 ENCOUNTER — Encounter: Payer: Self-pay | Admitting: Internal Medicine

## 2022-04-19 ENCOUNTER — Encounter: Payer: Self-pay | Admitting: Physician Assistant

## 2022-04-25 ENCOUNTER — Other Ambulatory Visit: Payer: Self-pay | Admitting: Physician Assistant

## 2022-04-25 DIAGNOSIS — M2559 Pain in other specified joint: Secondary | ICD-10-CM

## 2022-05-02 ENCOUNTER — Ambulatory Visit (INDEPENDENT_AMBULATORY_CARE_PROVIDER_SITE_OTHER)
Admission: RE | Admit: 2022-05-02 | Discharge: 2022-05-02 | Disposition: A | Discharge status: Home / Self Care | Payer: Managed Care, Other (non HMO) | Source: Ambulatory Visit | Attending: Family Medicine | Admitting: Family Medicine

## 2022-05-02 ENCOUNTER — Ambulatory Visit: Payer: Managed Care, Other (non HMO) | Admitting: Family Medicine

## 2022-05-02 ENCOUNTER — Encounter: Payer: Self-pay | Admitting: Family Medicine

## 2022-05-02 VITALS — BP 110/78 | HR 103 | Ht 60.75 in | Wt 144.8 lb

## 2022-05-02 DIAGNOSIS — M25552 Pain in left hip: Secondary | ICD-10-CM

## 2022-05-02 DIAGNOSIS — G8929 Other chronic pain: Secondary | ICD-10-CM

## 2022-05-02 NOTE — Patient Instructions (Addendum)
Thank you for coming in today.   Please get an Xray today before you leave   I've referred you to Physical Therapy.  Let us know if you don't hear from them in one week.   Recheck in 6 weeks.   Let me know if this is not working.    

## 2022-05-02 NOTE — Progress Notes (Unsigned)
   I, Peterson Lombard, LAT, ATC acting as a scribe for Lynne Leader, MD.  Subjective:    CC: Polyarthralgia  HPI: Patient is a 42 year old female presenting with polyarthralgia.  Of note, patient is currently being treated by Dr. Benjamine Mola, at rheumatology, with a positive ANA, and is currently taking Plaquenil ('200mg'$ ).  Patient was seen at the [facility], ED on 02/14/2022 for left shoulder/arm pain.  Today, patient locates pain to the left hip. Left hip pain has gotten worse recently. Burning sensation after walking for a while. Burning pain radiates into the thigh and gluteal region.   Dx testing: 02/08/2022 labs 06/15/2021 C-spine & T-spine MRI  Pertinent review of Systems: ***  Relevant historical information: ***   Objective:   There were no vitals filed for this visit. General: Well Developed, well nourished, and in no acute distress.   MSK: ***  Lab and Radiology Results No results found for this or any previous visit (from the past 72 hour(s)). No results found.    Impression and Recommendations:    Assessment and Plan: 42 y.o. female with ***.  PDMP not reviewed this encounter. No orders of the defined types were placed in this encounter.  No orders of the defined types were placed in this encounter.   Discussed warning signs or symptoms. Please see discharge instructions. Patient expresses understanding.   ***

## 2022-05-04 NOTE — Progress Notes (Signed)
Lumbar spine x-ray shows a little bit of arthritis changes of the low back. This is expected at 42 years old.

## 2022-05-04 NOTE — Progress Notes (Signed)
Left hip x-ray looks normal to radiology.

## 2022-05-10 ENCOUNTER — Ambulatory Visit: Payer: Managed Care, Other (non HMO) | Admitting: Internal Medicine

## 2022-06-14 ENCOUNTER — Ambulatory Visit: Payer: Managed Care, Other (non HMO) | Admitting: Family Medicine

## 2022-06-14 VITALS — BP 116/80 | HR 85 | Ht 60.0 in | Wt 141.0 lb

## 2022-06-14 DIAGNOSIS — S93522A Sprain of metatarsophalangeal joint of left great toe, initial encounter: Secondary | ICD-10-CM

## 2022-06-14 DIAGNOSIS — G8929 Other chronic pain: Secondary | ICD-10-CM

## 2022-06-14 DIAGNOSIS — M25551 Pain in right hip: Secondary | ICD-10-CM

## 2022-06-14 NOTE — Progress Notes (Signed)
   Rubin Payor, PhD, LAT, ATC acting as a scribe for Alicia Graham, MD.  Alicia Carr is a 42 y.o. female who presents to [facility] at [facility] today for 6-wk f/u L lateral hip pain. Of note, patient is currently being treated by Dr. Dimple Casey, at rheumatology, with a positive ANA, and is currently taking Plaquenil ( ). Pt was last seen by Dr. Denyse Amass 05/02/22 and was referred to PT at the [facility] location, but her 1st visit isn't scheduled until 07/05/22. Today, pt reports she has been limiting her longer walks. She notes a stumble at work injury her L foot. Pt locates pain to the medial aspect of her big toe joint in her left foot.   Dx testing: 05/02/22 L-spine & L hip XR 02/08/2022 labs 06/15/2021 C-spine & T-spine MRI  Pertinent review of systems: No fevers or chills  Relevant historical information: Cholangitis   Exam:  BP 116/80   Pulse 85   Ht 5' (1.524 m)   Wt 141 lb (64 kg)   SpO2 95%   BMI 27.54 kg/m  General: Well Developed, well nourished, and in no acute distress.   MSK: Right hip normal motion.  Left great toe some swelling.  Tender palpation medial aspect.  Normal toe motion.  Pain with dorsiflexion great toe at MTP.     Assessment and Plan: 42 y.o. female with right hip pain due to trochanteric bursitis.  Scheduled to start physical therapy in about 2 or 3 weeks.  This should be quite helpful.  Plan to continue PT and recheck after completion of PT which is late June.  Left great toe pain and MTP.  Consistent with turf toe injury.  Plan for turf toe insole.  Continue with existing provider.   PDMP not reviewed this encounter. No orders of the defined types were placed in this encounter.  No orders of the defined types were placed in this encounter.    Discussed warning signs or symptoms. Please see discharge instructions. Patient expresses understanding.   The above documentation has been reviewed and is accurate and complete  Alicia Carr, M.D.

## 2022-06-14 NOTE — Patient Instructions (Addendum)
Thank you for coming in today.   I think you have turf toe.   Try a turf toe insole or even up to a cam walker boot.   Get a Steel Turf Toe insole.  Do a Microbiologist for [employer]   Plan to recheck near the end of June or sooner if needed.   Let me know if that toe is not improving.

## 2022-06-21 ENCOUNTER — Ambulatory Visit (HOSPITAL_COMMUNITY): Payer: Managed Care, Other (non HMO)

## 2022-06-21 ENCOUNTER — Ambulatory Visit: Payer: Managed Care, Other (non HMO) | Admitting: Internal Medicine

## 2022-06-21 NOTE — Therapy (Deleted)
OUTPATIENT PHYSICAL THERAPY LOWER EXTREMITY EVALUATION   Patient Name: Alicia Carr MRN: [id number] DOB:September 17, 1980, 42 y.o., female Today's Date: 06/21/2022  END OF SESSION:   Past Medical History:  Diagnosis Date   Alcoholism (HCC)    Anemia    Early hepatic fibrosis    Family history of ovarian cancer    Family history of prostate cancer    Gallstones    Genetic testing 11/29/2021   History of chicken pox    Hyperpigmentation    Longitudinal melanonychia    Nevus    right dorsal wrist   Positive ANA (antinuclear antibody)    Primary sclerosing cholangitis    Pruritus    Raynaud's syndrome    Shingles 07/18/2020   Vaginal delivery 2003   Past Surgical History:  Procedure Laterality Date   CESAREAN SECTION  08/08/2006   CHOLECYSTECTOMY  2003   COLONOSCOPY  09/2020   LIVER BIOPSY  05/2020   ROBOTIC ASSISTED SALPINGO OOPHERECTOMY Bilateral 12/03/2021   Procedure: XI ROBOTIC ASSISTED BILATERAL SALPINGECTOMY AND RIGHT OOPHORECTOMY;  Surgeon: Conard Novak, MD;  Location: [facility] ORS;  Service: Gynecology;  Laterality: Bilateral;   TUBAL LIGATION  2023   Tubes were actually removed due to cancer gene mutation   Patient Active Problem List   Diagnosis Date Noted   Right ovarian cyst 12/03/2021   Monoallelic mutation of RAD51C gene 11/30/2021   Genetic testing 11/29/2021   Family history of prostate cancer 11/04/2021   Family history of ovarian cancer 11/04/2021   Intolerant of cold 08/02/2021   Menorrhagia 08/02/2021   Folliculitis 06/10/2021   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 cause 06/18/2020    PCP: Jarold Motto, PA  REFERRING PROVIDER: Rodolph Bong, MD  REFERRING DIAG: chronic left hip  pain  THERAPY DIAG:  No diagnosis found.  Rationale for Evaluation and Treatment: Rehabilitation  ONSET DATE: ***  SUBJECTIVE:   SUBJECTIVE STATEMENT: ***  PERTINENT HISTORY: Patient is a 42 y.o. female who presents to outpatient physical therapy with a referral for medical diagnosis chronic left hip pain. This patient's chief complaints consist of ***, leading to the following functional deficits: ***. Relevant past medical history and comorbidities include ***.  Patient denies hx of {redflags:27294}  PAIN:  Are you having pain? Yes: NPRS scale: Current: ***/10,  Best: ***/10, Worst: ***/10. Pain location: *** Pain description: *** Aggravating factors: *** Relieving factors: ***   FUNCTIONAL LIMITATIONS: ***  LEISURE: ***   PRECAUTIONS: {Therapy precautions:24002}  WEIGHT BEARING RESTRICTIONS: {Yes ***/No:24003}  FALLS:  Has patient fallen in last 6 months? {fallsyesno:27318}  LIVING ENVIRONMENT: Lives with: {OPRC lives with:25569::"lives with their family"} Lives in: {Lives in:25570} Stairs: {opstairs:27293} Has following equipment at home: {Assistive devices:23999}  OCCUPATION: ***  PLOF: {PLOF:24004}  PATIENT GOALS: ***  NEXT MD VISIT: ***  OBJECTIVE  DIAGNOSTIC FINDINGS:  Lumbar xray report from 05/02/2022:  CLINICAL DATA:  Low back and left hip pain for several weeks, no known injury, initial encounter   EXAM: LUMBAR SPINE - 3 VIEW   COMPARISON:  None Available.   FINDINGS: Five lumbar type vertebral bodies are well visualized. Vertebral body height is well maintained. Minimal osteophytic changes are noted. No anterolisthesis is seen. No soft tissue abnormality is noted.   IMPRESSION: Mild degenerative changes of the  lumbar spine.     Electronically Signed   By: Alcide Clever M.D.   On: 05/04/2022 00:20   Left hip xray report from 05/02/2022: CLINICAL DATA:  Left hip pain while walking for several weeks, no known injury, initial  encounter   EXAM: DG HIP (WITH OR WITHOUT PELVIS) 2-3V LEFT   COMPARISON:  None Available.   FINDINGS: Pelvic ring is intact. No acute fracture or dislocation is noted. No soft tissue abnormality is seen.   IMPRESSION: No acute abnormality noted.     Electronically Signed   By: Alcide Clever M.D.   On: 05/04/2022 00:19    SELF- REPORTED FUNCTION FOTO score: ***/100 (hip questionnaire)  OBSERVATION/INSPECTION Posture Posture (seated): forward head, rounded shoulders, slumped in sitting.  Posture (standing): *** Posture correction: *** Anthropometrics Tremor: none Body composition: *** Muscle bulk: *** Skin: The incision sites appear to be healing well with no excessive redness, warmth, drainage or signs of infection present.  *** Edema: *** Functional Mobility Bed mobility: *** Transfers: *** Gait: grossly WFL for household and short community ambulation. More detailed gait analysis deferred to later date as needed. *** Stairs: ***  SPINE MOTION  LUMBAR SPINE AROM *Indicates pain Flexion: *** Extension: *** Side Flexion:   R ***  L *** Rotation:  R *** L *** Side glide:  R *** L ***   NEUROLOGICAL  Upper Motor Neuron Screen Babinski, Hoffman's and Clonus (ankle) negative bilaterally.  Dermatomes C2-T1 appears equal and intact to light touch except the following: *** L2-S2 appears equal and intact to light touch except the following: *** Deep Tendon Reflexes R/L  ***+/***+ Biceps brachii reflex (C5, C6) ***+/***+ Brachioradialis reflex (C6) ***+/***+ Triceps brachii reflex (C7) ***+/***+ Quadriceps reflex (L4) ***+/***+ Achilles reflex (S1)  SPINE MOTION  CERVICAL SPINE AROM *Indicates pain Flexion: *** Extension: *** Side Flexion:   R ***  L *** Rotation:  R *** L ***   PERIPHERAL JOINT MOTION (in degrees)  ACTIVE RANGE OF MOTION (AROM) *Indicates pain Date Date Date  Joint/Motion R/L R/L R/L  Shoulder     Flexion / / /   Extension / / /  Abduction  / / /  External rotation / / /  Internal rotation / / /  Elbow     Flexion  / / /  Extension  / / /  Wrist     Flexion / / /  Extension  / / /  Radial deviation / / /  Ulnar deviation / / /  Pronation / / /  Supination / / /  Hip     Flexion / / /  Extension  / / /  Abduction / / /  Adduction / / /  External rotation / / /  Internal rotation  / / /  Knee     Extension / / /  Flexoin / / /  Ankle/Foot     Dorsiflexion (knee ext) / / /  Dorsiflexion (knee flex) / / /  Plantarflexion / / /  Everison / / /  Inversion / / /  Great toe extension / / /  Great toe flexion / / /  Comments:   PASSIVE RANGE OF MOTION (PROM) *Indicates pain Date Date Date  Joint/Motion R/L R/L R/L  Shoulder     Flexion / / /  Extension / / /  Abduction  / / /  External rotation / / /  Internal rotation / / /  Elbow  Flexion  / / /  Extension  / / /  Wrist     Flexion / / /  Extension  / / /  Radial deviation / / /  Ulnar deviation / / /  Pronation / / /  Supination / / /  Hip     Flexion  / / /  Extension  / / /  Abduction / / /  Adduction / / /  External rotation / / /  Internal rotation  / / /  Knee     Extension / / /  Flexion / / /  Ankle/Foot     Dorsiflexion (knee ext) / / /  Dorsiflexion (knee flex) / / /  Plantarflexion / / /  Everison / / /  Inversion / / /  Great toe extension / / /  Great toe flexion / / /  Comments:   MUSCLE PERFORMANCE (MMT):  *Indicates pain Date Date Date  Joint/Motion R/L R/L R/L  Shoulder     Flexion / / /  Abduction (C5) / / /  External rotation / / /  Internal rotation / / /  Extension / / /  Elbow     Flexion (C6) / / /  Extension (C7) / / /  Wrist     Flexion (C7) / / /  Extension (C6) / / /  Radial deviation / / /  Ulnar deviation (C8) / / /  Pronation / / /  Supination / / /  Hand     Thumb extension (C8) / / /  Finger abduction (T1) / / /  Grip (C8) / / /  Hip     Flexion (L1,  L2) / / /  Extension (knee ext) / / /  Extension (knee flex) / / /  Abduction / / /  Adduction / / /  External rotation / / /  Internal rotation  / / /  Knee     Extension (L3) / / /  Flexion (S2) / / /  Ankle/Foot     Dorsiflexion (L4) / / /  Great toe extension (L5) / / /  Eversion (S1) / / /  Plantarflexion (S1) / / /  Inversion / / /  Pronation / / /  Great toe flexion / / /  Comments:   SPECIAL TESTS:  .Neurodynamictests .NeurodynamicUE .NeurodynamicLE .CspineInstability .CSPINESPECIALTESTS .SHOULDERSPECIALTESTCLUSTERS .HIPSPECIALTESTS .SIJSPECIALTESTS   SHOULDER SPECIAL TESTS RTC, Impingement, Anterior Instability (macrotrauma), Labral Tear: Painful arc test: R = ***, L = ***. Drop arm test: R = ***, L = ***. Hawkins-Kennedy test: R = ***, L = ***. Infraspinatus test: R = ***, L = ***. Apprehension test: R = ***, L = ***. Relocation test: R = ***, L = ***. Active compression test: R = ***, L = ***.  ACCESSORY MOTION: ***  PALPATION: ***  SUSTAINED POSITIONS TESTING:  ***  REPEATED MOTIONS TESTING: ***  FUNCTIONAL/BALANCE TESTS: Five Time Sit to Stand (5TSTS): *** seconds Functional Gait Assessment (FGA): ***/30 (see details above) Ten meter walking trial ( ): *** m/s Six Minute Walk Test ( ): *** feet Timed Up and Go (TUG): *** seconds   Dynamic Gait Index: ***/24 BERG Balance Scale: ***/56 Tinetti/POMA: ***/28 Timed Up and GO: *** seconds (average of 3 trials) Trial 1: *** Trial 2: *** Trial 3: *** Romberg test: -Narrow stance, eyes open: *** seconds -Narrow stance, eyes closed: *** seconds Sharpened Romberg test: -Tandem stance, eyes open: *** seconds -Tandem stance,  eyes closed: *** seconds  Narrow stance, firm surface, eyes open: *** seconds Narrow stance, firm surface, eyes closed: *** seconds Narrow stance, compliant surface, eyes open: *** seconds Narrow stance, compliant surface, eyes closed: *** seconds Single leg  stance, firm surface, eyes open: R= *** seconds, L= *** seconds Single leg stance, compliant surface, eyes open: R= *** seconds, L= *** seconds Gait speed: *** m/s Functional reach test: *** inches   TODAY'S TREATMENT:    PATIENT EDUCATION:  Education details: *** Person educated: {Person educated:25204} Education method: {Education Method:25205} Education comprehension: {Education Comprehension:25206}  HOME EXERCISE PROGRAM: ***  ASSESSMENT:  CLINICAL IMPRESSION: Patient is a 42 y.o. female referred to outpatient physical therapy with a medical diagnosis of chronic left hip pain who presents with signs and symptoms consistent with ***. Patient presents with significant *** impairments that are limiting ability to complete *** without difficulty. Patient will benefit from skilled physical therapy intervention to address current body structure impairments and activity limitations to improve function and work towards goals set in current POC in order to return to prior level of function or maximal functional improvement.   OBJECTIVE IMPAIRMENTS: {opptimpairments:25111}.   ACTIVITY LIMITATIONS: {activitylimitations:27494}  PARTICIPATION LIMITATIONS: {participationrestrictions:25113}  PERSONAL FACTORS: {Personal factors:25162} are also affecting patient's functional outcome.   REHAB POTENTIAL: {rehabpotential:25112}  CLINICAL DECISION MAKING: {clinical decision making:25114}  EVALUATION COMPLEXITY: {Evaluation complexity:25115}   GOALS: Goals reviewed with patient? No  SHORT TERM GOALS: Target date: 07/05/2022  Patient will be independent with initial home exercise program for self-management of symptoms. Baseline: {HEPbaseline4:27310} (06/21/22); Goal status: INITIAL   LONG TERM GOALS: Target date: 09/13/2022  Patient will be independent with a long-term home exercise program for self-management of symptoms.  Baseline: {HEPbaseline4:27310} (06/21/22); Goal status:  INITIAL  2.  Patient will demonstrate improved FOTO to equal or greater than *** by visit #*** to demonstrate improvement in overall condition and self-reported functional ability.  Baseline: *** (06/21/22); Goal status: INITIAL  3.  *** Baseline: *** (06/21/22); Goal status: INITIAL  4.  *** Baseline: *** (06/21/22); Goal status: INITIAL  5.  Patient will complete community, work and/or recreational activities without limitation due to current condition.  Baseline: *** (06/21/22); Goal status: INITIAL  6.  *** Baseline: *** Goal status: INITIAL    PLAN:  PT FREQUENCY: 1-2x/week  PT DURATION: 12 weeks  PLANNED INTERVENTIONS: {rehab planned interventions:25118::"Therapeutic exercises","Therapeutic activity","Neuromuscular re-education","Balance training","Gait training","Patient/Family education","Self Care","Joint mobilization"}  PLAN FOR NEXT SESSION: ***   Bless Lisenby R. Ilsa Iha, PT, DPT 06/21/22, 4:03 PM  [facility] [address] [place], [place] [address] P: [phone] I F: [phone]

## 2022-06-21 NOTE — Progress Notes (Deleted)
Office Visit Note  [name]             Date of Birth: 24-Mar-1980           MRN: [id number]             PCP: Jarold Motto, PA Referring: Jarold Motto, PA Visit Date: 06/21/2022   Subjective:  No chief complaint on file.   History of Present Illness: Alicia Carr is a 42 y.o. female here for follow up ***   Previous HPI 02/08/22 Alicia Carr is a 42 y.o. 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  She has been off any specific treatment since last visit besides supplementing vitamin D and eyedrops.  Has not seen any resolution of hyperpigmented areas still has new skin rashes popping up in several areas and dry skin.  With cooler weather she is noticing blue discoloration in her fingers more frequently she is not sure if this is the Raynaud's specifically or just general cold intolerance.  She had genetic testing in September as recommended by Dr. Leone Payor this was not positive for GI association did have mutation with increased risk of breast and ovarian cancer.  So she had right ovary as well as bilateral fallopian tubes removed when going for the ovarian cyst surgery.  Does think she might be having more arthralgias since quitting the hydroxychloroquine but could also be related to cold weather.  Not seeing any obvious swelling or effusions.   Previous HPI 10/26/21 Alicia Carr is a 42 y.o. female here for follow up for undifferentiated connective tissue disease symptoms.  Unclear regarding relation of symptoms to underlying diagnosis of early primary sclerosing cholangitis.  She stopped taking the hydroxychloroquine for concern about a contributing to skin hyperpigmentation has not seen any change regarding this.  She did notice an increase in joint pains particularly around her knuckles and in bilateral knees. This is not debilitating or requiring her  to take additional over-the-counter medication to manage.  Raynaud's symptoms have been doing better and she thinks is associated with the recent warmer weather.  Repeat lab follow-up with normal liver function test.  She followed up with Dr. Leone Payor regarding concerns and has been referred to see a geneticist upcoming appointment on September 6.   Previous HPI 08/02/2021 Alicia Carr is a 42 y.o. female here for follow up for raynaud's symptoms, currently taking plaquenil 200 mg by mouth once daily. Since our last visit she feels the joint pain and stiffness improved from before. She also has less raynaud's symptoms than before, although this is not unusual during warmer time of the year. She continues having the small areas of twitching from before and saw Dr. Marjory Lies for this with workup including broad screening antibody testing and imaging of cervical and thoracic spine without major abnormalities. She reports progression of skin hyperpigmentation changes affecting her in multiple areas in patches and also some isolated small hyperpigmented spots on her face and palms and longitudinal changes in her fingernails. She had a nail biopsy consistent with melanotic macule. She saw Elpidio Anis for this problem who had some concern about possible autoimmune vs genetic or proliferative causes. Symptoms started before exposure to hydroxychloroquine but also concern for exacerbating symptoms. She has a lot of healthcare related anxiety surrounding this problem, worse after a coworker passed unexpected from metastatic pancreatic cancer recently. She is drinking alcohol more frequently  now and has started to gain weight and feels this is from depressed mood.   Previous HPI 05/03/2021 Alicia Carr is a 42 y.o. female here for follow up for raynaud's symptoms after starting sildenafil 20 mg. She continued noticing finger discoloration and rashes. She is also concerned with noticing some hyperpigmentation on the  palms of her hands and also darkening of her gums. Still having some twitching and aches and overall concerned she is not feeling well. She had a mild amount of leg swelling intermittently. She is concerned about if her symptoms could be an adrenal or hormonal issue with ongoing skin changes and generalized symptoms.   Previous HPI 04/07/21 Alicia Carr is a 42 y.o. female here for follow up with numerous different symptoms ongoing some in exacerbation.  So far addition of the amlodipine has not significantly helped her finger or toe discoloration is actually having more blue and violaceous changes than before in the fingers.  She feels extremely cold much of the time somewhat regardless of the ambient temperature.  Chronic persistent fatigue remains her most significant issue.  She also continues having muscle twitching or inability to fully close or extend for periods of time the seem to come and go without trigger.  She has some hand swelling no but no increase in leg swelling.  She was feeling some palpitations and wore a heart monitor apparently this showed some episodes of moderate tachycardia but without concerning arrhythmias seen.   Previous HPI 10/06/20 Alicia Carr is a 42 y.o. female here with joint pains and numbness affecting bilateral fingers and toes that is worse especially in the past few months.  She developed shingles outbreak on the face and left eye in May this is treated and symptoms have improved besides some residual hyperpigmentation.  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  She reports morning stiffness difficulty gripping her hands tightly or performing tasks first thing in the morning this improves some time and is normal by the afternoon.  She notices occasional swelling or puffiness especially in the feet but not all the time.  She denies discoloration  such as pallor, erythema, or cyanosis.  She tried taking oral diclofenac for joint pains but developed an episode of painless hematochezia so stopped this medication. She denies any lymphadenopathy, photosensitive rashes, or history of blood clots.   No Rheumatology ROS completed.   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 cause 06/18/2020    Past Medical History:  Diagnosis Date   Alcoholism (HCC)    Anemia    Early hepatic fibrosis    Family history of ovarian cancer    Family history of prostate cancer    Gallstones    Genetic testing 11/29/2021   History of chicken pox    Hyperpigmentation    Longitudinal melanonychia  Nevus    right dorsal wrist   Positive ANA (antinuclear antibody)    Primary sclerosing cholangitis    Pruritus    Raynaud's syndrome    Shingles 07/18/2020   Vaginal delivery 2003    Family History  Problem Relation Age of Onset   Mental illness Mother        paranoid schizophrenia   Hyperlipidemia Mother    Alcohol abuse Father    Colon polyps Father    Hypertension Father    Prostate cancer Father 47   Healthy Daughter    Asthma Daughter    Learning disabilities Daughter    ADD / ADHD Daughter    Anxiety disorder Daughter    Diabetes Paternal Aunt    Cancer Maternal Grandmother        uncertain type   Heart murmur Brother    Healthy  Son    Ovarian cancer Other        PGF sister   Breast cancer Neg Hx    Past Surgical History:  Procedure Laterality Date   CESAREAN SECTION  08/08/2006   CHOLECYSTECTOMY  2003   COLONOSCOPY  09/2020   LIVER BIOPSY  05/2020   ROBOTIC ASSISTED SALPINGO OOPHERECTOMY Bilateral 12/03/2021   Procedure: XI ROBOTIC ASSISTED BILATERAL SALPINGECTOMY AND RIGHT OOPHORECTOMY;  Surgeon: Conard Novak, MD;  Location: [facility] ORS;  Service: Gynecology;  Laterality: Bilateral;   TUBAL LIGATION  2023   Tubes were actually removed due to cancer gene mutation   Social History   Social History Narrative   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, retail for [employer]   1 son born 2003 1 daughter born 2008   Alcohol use regular for 10+ years stopped 01/18/2020 was 2 to 3 glasses of wine daily   No drug use   Coffee and tea 2 or 3 a day for caffeine   Immunization History  Administered Date(s) Administered   Hep A / Hep B 09/08/2020, 10/12/2020   Hepatitis A, Adult 03/12/2021   Hepatitis B, PED/ADOLESCENT 03/12/2021, 03/12/2021   Influenza,inj,Quad PF,6+ Mos 12/26/2018, 11/29/2019, 12/03/2020, 12/14/2021   Influenza,inj,quad, With Preservative 11/29/2019   PFIZER(Purple Top)SARS-COV-2 Vaccination 05/02/2019, 05/23/2019, 01/03/2020, 10/06/2020   Tdap 05/10/2006, 11/29/2019     Objective: Vital Signs: There were no vitals taken for this visit.   Physical Exam   Musculoskeletal Exam: ***  CDAI Exam: CDAI Score: -- Patient Global: --; Provider Global: -- Swollen: --; Tender: -- Joint Exam 06/21/2022   No joint exam has been documented for this visit   There is currently no information documented on the homunculus. Go to the Rheumatology activity and complete the homunculus joint exam.  Investigation: No additional findings.  Imaging: No results found.  Recent Labs: Lab Results  Component Value Date   WBC 7.8 02/14/2022   HGB 11.9 (L) 02/14/2022   PLT 270 02/14/2022    NA 137 02/14/2022   K 4.4 02/14/2022   CL 104 02/14/2022   CO2 24 02/14/2022   GLUCOSE 90 02/14/2022   BUN 13 02/14/2022   CREATININE 0.88 02/14/2022   BILITOT 0.6 02/14/2022   ALKPHOS 64 02/14/2022   AST 24 02/14/2022   ALT 16 02/14/2022   PROT 7.3 02/14/2022   ALBUMIN 3.9 02/14/2022   CALCIUM 9.0 02/14/2022   GFRAA 95 08/24/2016   QFTBGOLDPLUS NEGATIVE 07/23/2020    Speciality Comments: PLQ Eye Exam normal 02/08/2022 WNL [employer], OD f/u 6 months  Procedures:  No procedures performed Allergies: Patient  has no known allergies.   Assessment / Plan:     Visit Diagnoses: No diagnosis found.  ***  Orders: No orders of the defined types were placed in this encounter.  No orders of the defined types were placed in this encounter.    Follow-Up Instructions: No follow-ups on file.   Fuller Plan, MD  Note - This record has been created using [employer].  Chart creation errors have been sought, but may not always  have been located. Such creation errors do not reflect on  the standard of medical care.

## 2022-07-05 ENCOUNTER — Encounter: Payer: Self-pay | Admitting: Hematology and Oncology

## 2022-07-05 ENCOUNTER — Ambulatory Visit: Payer: Managed Care, Other (non HMO) | Attending: Family Medicine

## 2022-07-05 ENCOUNTER — Encounter: Payer: Self-pay | Admitting: Physical Therapy

## 2022-07-05 DIAGNOSIS — G8929 Other chronic pain: Secondary | ICD-10-CM | POA: Insufficient documentation

## 2022-07-05 DIAGNOSIS — M25552 Pain in left hip: Secondary | ICD-10-CM

## 2022-07-05 DIAGNOSIS — M6281 Muscle weakness (generalized): Secondary | ICD-10-CM | POA: Insufficient documentation

## 2022-07-05 NOTE — Therapy (Signed)
OUTPATIENT PHYSICAL THERAPY LOWER EXTREMITY EVALUATION   Patient Name: Alicia Carr MRN: [id number] DOB:Nov 02, 1980, 42 y.o., female Today's Date: 07/05/2022  END OF SESSION:  PT End of Session - 07/05/22 1434     Visit Number 1    Number of Visits 12    Date for PT Re-Evaluation 08/16/22    Authorization - Number of Visits 30    PT Start Time 1434    PT Stop Time 1517    PT Time Calculation (min) 43 min    Activity Tolerance Patient tolerated treatment well    Behavior During Therapy [facility] for tasks assessed/performed             Past Medical History:  Diagnosis Date   Alcoholism (HCC)    Anemia    Early hepatic fibrosis    Family history of ovarian cancer    Family history of prostate cancer    Gallstones    Genetic testing 11/29/2021   History of chicken pox    Hyperpigmentation    Longitudinal melanonychia    Nevus    right dorsal wrist   Positive ANA (antinuclear antibody)    Primary sclerosing cholangitis    Pruritus    Raynaud's syndrome    Shingles 07/18/2020   Vaginal delivery 2003   Past Surgical History:  Procedure Laterality Date   CESAREAN SECTION  08/08/2006   CHOLECYSTECTOMY  2003   COLONOSCOPY  09/2020   LIVER BIOPSY  05/2020   ROBOTIC ASSISTED SALPINGO OOPHERECTOMY Bilateral 12/03/2021   Procedure: XI ROBOTIC ASSISTED BILATERAL SALPINGECTOMY AND RIGHT OOPHORECTOMY;  Surgeon: Conard Novak, MD;  Location: [facility] ORS;  Service: Gynecology;  Laterality: Bilateral;   TUBAL LIGATION  2023   Tubes were actually removed due to cancer gene mutation   Patient Active Problem List   Diagnosis Date Noted   Right ovarian cyst 12/03/2021   Monoallelic mutation of RAD51C gene 11/30/2021   Genetic testing 11/29/2021   Family history of prostate cancer 11/04/2021   Family history of ovarian cancer 11/04/2021   Intolerant of cold 08/02/2021   Menorrhagia 08/02/2021   Folliculitis 06/10/2021   Longitudinal melanonychia 06/10/2021    Hyperpigmentation of skin 05/03/2021   Raynaud's syndrome without gangrene 02/04/2021   Positive ANA (antinuclear antibody) 10/06/2020   Arthralgia 10/06/2020   Primary sclerosing cholangitis 09/04/2020   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 cause 06/18/2020    PCP: Jarold Motto, PA  REFERRING PROVIDER: Rodolph Bong, MD  REFERRING DIAG: Chronic L hip pain   THERAPY DIAG:  Pain in left hip  Muscle weakness (generalized)  Rationale for Evaluation and Treatment: Rehabilitation  ONSET DATE: 2-3 months ago (hip)  SUBJECTIVE:   SUBJECTIVE STATEMENT: Patient reports MD sent her for L hip pain but also has joint pain in her knees. Feels like a burning sensation after walking >15 minutes. Aggravating factors walking, stairs. Knee pain comes insidious and has no specific aggravating factors. Patient also reports pain in other joints and has been seeing rheumatologist for investigative reasons but no positive findings yet. Fall 3.5 weeks ago and injured her L great toe and has continued to hurt since. MD diagnosed as turf toe.   PERTINENT HISTORY: 42 y/o female presents to outpatient physical therapy with a referral for medical diagnosis of L trochanteric bursitis 2/2 chronic L hip pain. Patient's chief complaint consist of L hip pain with prolonged walking and stair negotiation, leading to the following functional deficits which include LE weakness, decreased flexibility, and  decreased activity tolerance. PMH includes Raynaud's syndrome, anemia, primary sclerosing cholangitis  PAIN:  Are you having pain? Yes: NPRS scale: 3/10 at worst  Pain location: L hip Pain description: burning, discomfort  Aggravating factors: walking, stairs Relieving factors: rest, laying on opposite side  PRECAUTIONS: None  WEIGHT BEARING RESTRICTIONS: No  FALLS:  Has patient fallen in last 6 months? Yes. Number of falls 1 - fall at work and hurt L foot 3.5 weeks ago  LIVING  ENVIRONMENT: Lives with: lives with their spouse Lives in: House/apartment Stairs: No Has following equipment at home: None  OCCUPATION: Works at [employer]  PLOF: Independent  PATIENT GOALS: how to make L hip feel better or pain go away  NEXT MD VISIT: 08/2022 (Dr. Denyse Amass)  OBJECTIVE:   DIAGNOSTIC FINDINGS:  EXAM: DG HIP (WITH OR WITHOUT PELVIS) 2-3V LEFT IMPRESSION: No acute abnormality noted.  EXAM: LUMBAR SPINE - 3 VIEW IMPRESSION: Mild degenerative changes of the lumbar spine.  PATIENT SURVEYS:  FOTO 57  COGNITION: Overall cognitive status: Within functional limits for tasks assessed   SENSATION: WFL   POSTURE: No Significant postural limitations  PALPATION: Tenderness noted over L piriformis and L lumbar paraspinals Hamstring tightness with inability to achieve full extension in the 90/90 position    LOWER EXTREMITY MMT:  MMT *indicates pain Right eval Left eval  Hip flexion 5 4+*  Hip extension 3+ 3+  Hip abduction 5 4+*  Hip adduction 5 5  Knee flexion 5 4+  Knee extension 5 4+  Ankle dorsiflexion 5 5  Ankle plantarflexion 5 5   (Blank rows = not tested)  LOWER EXTREMITY SPECIAL TESTS:  Hip special tests: Luisa Hart (FABER) test: positive , Hip scouring test: negative, and Piriformis test: positive    FUNCTIONAL TESTS:  5 times sit to stand: Will test next session 10 meter walk test: will test next session   GAIT: Distance walked: 20 Assistive device utilized: None Level of assistance: Complete Independence Comments: gait WFL this date due to patient denying pain in L hip    TODAY'S TREATMENT:                                                                                                                              DATE: 07/05/22  HEP provided and demonstrated.   PATIENT EDUCATION:  Education details: HEP, POC, goals, purpose of PT Person educated: Patient Education method: Explanation, Demonstration, and Handouts Education  comprehension: verbalized understanding  HOME EXERCISE PROGRAM: Access Code: [email or web address] URL: [email or web address] Date: 07/05/2022 Prepared by:   Exercises - Clamshell  - 1 x daily - 7 x weekly - 3 sets - 10 reps - Supine Bridge  - 2-3 x daily - 7 x weekly - 3 sets - 10 reps - Supine Active Straight Leg Raise  - 2-3 x daily - 7 x weekly - 3 sets - 10 reps - Supine Figure 4 Piriformis Stretch  - 2-3 x daily - 7 x  weekly - 3 sets - 10 reps - Seated Piriformis Stretch with Trunk Bend  - 2-3 x daily - 7 x weekly - 10 reps - 30 hold - Seated Hamstring Stretch  - 2-3 x daily - 7 x weekly - 10 reps - 30 hold  ASSESSMENT:  CLINICAL IMPRESSION: Patient is a 42 y.o. female who was seen today for physical therapy evaluation and treatment for L hip pain. Patient presents with muscle tightness, B LE functional weakness, decreased activity tolerance, and pain with prolonged activity. Notable L piriformis and L lumbar paraspinal tightness with palpation and special test. Denies radiating pain in L LE with lumbar mobilizations. Patient denies pain this session but typically prolonged ambulation is primary aggravating factor. Patient will benefit from skilled therapy to address deficits in order to improve quality of life and return to PLOF.    OBJECTIVE IMPAIRMENTS: Abnormal gait, decreased activity tolerance, decreased balance, decreased endurance, decreased mobility, difficulty walking, decreased ROM, decreased strength, impaired flexibility, and pain.   ACTIVITY LIMITATIONS: bending, standing, squatting, sleeping, and stairs  PARTICIPATION LIMITATIONS: community activity and yard work  PERSONAL FACTORS: Age, Education, Fitness, Sex, and Social background are also affecting patient's functional outcome.   REHAB POTENTIAL: Good  CLINICAL DECISION MAKING: Stable/uncomplicated  EVALUATION COMPLEXITY: Low   GOALS: Goals reviewed with patient? Yes  SHORT TERM GOALS: Target date:  07/19/2022  Patient will be independent in HEP to improve strength/mobility for better functional independence with ADLs. Baseline: 5/7 provided HEP Goal status: INITIAL   LONG TERM GOALS: Target date: 08/16/2022  Patient will increase FOTO score to equal to or greater than  62   to demonstrate statistically significant improvement in mobility and quality of life.  Baseline: 5/7: 57 Goal status: INITIAL  2.  Patient will increase BLE gross strength to >4+/5 as to improve functional strength for independent gait, increased standing tolerance and increased ADL ability. Baseline: 5/7:see chart above Goal status: INITIAL  3.  Patient will report a worst pain of 1/10 on VAS in             to improve tolerance with ADLs and reduced symptoms with activities.  Baseline: 5/7: worst 3/10 Goal status: INITIAL    PLAN:  PT FREQUENCY: 1-2x/week  PT DURATION: 6 weeks  PLANNED INTERVENTIONS: Therapeutic exercises, Therapeutic activity, Neuromuscular re-education, Balance training, Gait training, Patient/Family education, Self Care, Joint mobilization, Stair training, Dry Needling, Spinal manipulation, Spinal mobilization, Cryotherapy, Moist heat, Traction, Ultrasound, Ionotophoresis 4mg /ml Dexamethasone, and Manual therapy  PLAN FOR NEXT SESSION: Functional test due to lack of time at evaluation, HEP review, stretching    Viviann Spare, PT, DPT Physical Therapist - [facility]  [facility]  07/05/2022, 6:36 PM

## 2022-07-06 NOTE — Therapy (Unsigned)
OUTPATIENT PHYSICAL THERAPY TREATMENT NOTE   Patient Name: Alicia Carr MRN: [id number] DOB:Oct 26, 1980, 41 y.o., female Today's Date: 07/07/2022  PCP: Jarold Motto, PA  REFERRING PROVIDER: Rodolph Bong, MD   END OF SESSION:   PT End of Session - 07/07/22 1828     Visit Number 2    Number of Visits 12    Date for PT Re-Evaluation 08/16/22    Authorization Type [employer] MANAGED reporting period from 07/05/2022    Authorization - Visit Number 2    Authorization - Number of Visits 30    Progress Note Due on Visit 10    PT Start Time 1822    PT Stop Time 1905    PT Time Calculation (min) 43 min    Activity Tolerance Patient tolerated treatment well    Behavior During Therapy [facility] for tasks assessed/performed;Anxious             Past Medical History:  Diagnosis Date   Alcoholism (HCC)    Anemia    Early hepatic fibrosis    Family history of ovarian cancer    Family history of prostate cancer    Gallstones    Genetic testing 11/29/2021   History of chicken pox    Hyperpigmentation    Longitudinal melanonychia    Nevus    right dorsal wrist   Positive ANA (antinuclear antibody)    Primary sclerosing cholangitis    Pruritus    Raynaud's syndrome    Shingles 07/18/2020   Vaginal delivery 2003   Past Surgical History:  Procedure Laterality Date   CESAREAN SECTION  08/08/2006   CHOLECYSTECTOMY  2003   COLONOSCOPY  09/2020   LIVER BIOPSY  05/2020   ROBOTIC ASSISTED SALPINGO OOPHERECTOMY Bilateral 12/03/2021   Procedure: XI ROBOTIC ASSISTED BILATERAL SALPINGECTOMY AND RIGHT OOPHORECTOMY;  Surgeon: Conard Novak, MD;  Location: [facility] ORS;  Service: Gynecology;  Laterality: Bilateral;   TUBAL LIGATION  2023   Tubes were actually removed due to cancer gene mutation   Patient Active Problem List   Diagnosis Date Noted   Right ovarian cyst 12/03/2021   Monoallelic mutation of RAD51C gene 11/30/2021   Genetic testing 11/29/2021   Family history of prostate  cancer 11/04/2021   Family history of ovarian cancer 11/04/2021   Intolerant of cold 08/02/2021   Menorrhagia 08/02/2021   Folliculitis 06/10/2021   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 cause 06/18/2020    REFERRING DIAG: chronic left hip pain   THERAPY DIAG:  Pain in left hip  Muscle weakness (generalized)  Rationale for Evaluation and Treatment: Rehabilitation  PERTINENT HISTORY: 42 y/o female presents to outpatient physical therapy with a referral for medical diagnosis of L trochanteric bursitis 2/2 chronic L hip pain. Patient's chief complaint consist of L hip pain with prolonged walking and stair negotiation, leading to the following functional deficits which include LE weakness, decreased flexibility, and decreased activity tolerance. PMH includes Raynaud's syndrome, anemia, primary sclerosing cholangitis, pain in multiple joints and has been seeing rheumatologist for investigative reasons but no positive findings yet   PRECAUTIONS: none  SUBJECTIVE:  SUBJECTIVE STATEMENT:  Patient reports she was a little sore after her last PT session. She left her HEP handout on her car so she did the ones she could remember that were designed to be done when laying down. She has had a very busy week. She states she has knee pain as well as hip pain and this is bothering her currently.    PAIN:  NPRS: 2/10 slightly burning inside both knee joints, R > L   OBJECTIVE:   Vitals:   07/07/22 1829  BP: 131/84  Pulse: 80  SpO2: 100%    FUNCTIONAL/BALANCE  TEST Five Time Sit To Stand: 8.47 seconds.   10 Meter Walking Test: 2.26 m/s (fast pace)  LOWER LIMB NEURODYNAMIC TESTS Straight Leg Raise (Sciatic nerve) R  = sudden intense pain at R heel at approx 45 degrees hip flexion, then pain behind right knee, both of which resolves with ankle PF.  L  = strong pain at posterior L knee at approx 60 degrees hip flexion, resolves with ankle PF.    TODAY'S TREATMENT:  Therapeutic exercise: to centralize symptoms and improve ROM, strength, muscular endurance, and activity tolerance required for successful completion of functional activities.  - vitals measurement to assess baseline (see above).  - sit <> stand 2x5 for speed (see 5TSTS above).  - 10 Meter Walk Testing (see above) - seated figure 4 piriformis stretch, 1x30 seconds each side - hooklying figure 4 piriformis stretch, 2x30 seconds each side - SLR testing (see above).  - hooklying bridge, 1x10 with 5 second holds - hooklying B hip abduction against RTB, 2x10 with 5 second hold.  - hooklying quad set with towel roll under knee, 1x5 each side and 1x5 B LE with 5 second hold.  - Education on HEP including handout   Pt required multimodal cuing for proper technique and to facilitate improved neuromuscular control, strength, range of motion, and functional ability resulting in improved performance and form.     PATIENT EDUCATION:  Education details: Exercise purpose/form. Self management techniques.  Education on HEP including handout  Person educated: Patient Education method: Explanation, Demonstration, and Handouts Education comprehension: verbalized understanding   HOME EXERCISE PROGRAM: Access Code: [email or web address] URL: [email or web address] Date: 07/07/2022 Prepared by: Norton Blizzard  Exercises - Bridge  - 1 x daily - 1 sets - 20 reps - 5 seconds hold - Hooklying Clamshell with Resistance  - 1 x daily - 1 sets - 20 reps - 5 seconds hold - Supine Quad Set  - 1 x daily - 1  sets - 20 reps - 5 seconds hold - Figure 4 stretch  - 1 x daily - 2-3 reps - 30 seconds hold   ASSESSMENT:   CLINICAL IMPRESSION: Patient arrives late to appointment and is anxious throughout session. Her job has been very stressful and demanding and she reports struggling with sleep and getting everything done. HEP was updated this session to be more realistic for patient to complete and the exercises wer reviewed resulting in improved form and confidence in her ability to complete HEP. Patient was tested for neural tension in the sciatic distribution and had some unexpected severe R heel pain and non-concordant pain B posterior knees that was sensitive to ankle position, but no concordant hip pain. Exercises and HEP focused on strengthening and stretching for the hips and knees to improve activity tolerance and reduce pain. Patient reported she felt a little better by end of the session compared to the start. Her anxiety also  seemed to calm some by end of session. Patient would benefit from continued management of limiting condition by skilled physical therapist to address remaining impairments and functional limitations to work towards stated goals and return to PLOF or maximal functional independence.   From initial PT eval 07/05/2022:  Patient is a 42 y.o. female who was seen today for physical therapy evaluation and treatment for L hip pain. Patient presents with muscle tightness, B LE functional weakness, decreased activity tolerance, and pain with prolonged activity. Notable L piriformis and L lumbar paraspinal tightness with palpation and special test. Denies radiating pain in L LE with lumbar mobilizations. Patient denies pain this session but typically prolonged ambulation is primary aggravating factor. Patient will benefit from skilled therapy to address deficits in order to improve quality of life and return to PLOF.     OBJECTIVE IMPAIRMENTS: Abnormal gait, decreased activity tolerance,  decreased balance, decreased endurance, decreased mobility, difficulty walking, decreased ROM, decreased strength, impaired flexibility, and pain.    ACTIVITY LIMITATIONS: bending, standing, squatting, sleeping, and stairs   PARTICIPATION LIMITATIONS: community activity and yard work   PERSONAL FACTORS: Age, Education, Fitness, Sex, and Social background are also affecting patient's functional outcome.    REHAB POTENTIAL: Good   CLINICAL DECISION MAKING: Stable/uncomplicated   EVALUATION COMPLEXITY: Low     GOALS: Goals reviewed with patient? Yes   SHORT TERM GOALS: Target date: 07/19/2022   Patient will be independent in HEP to improve strength/mobility for better functional independence with ADLs. Baseline: 5/7 provided HEP Goal status: in-progress     LONG TERM GOALS: Target date: 08/16/2022   Patient will increase FOTO score to equal to or greater than  62   to demonstrate statistically significant improvement in mobility and quality of life.  Baseline: 5/7: 57 Goal status: In-progress   2.  Patient will increase BLE gross strength to >4+/5 as to improve functional strength for independent gait, increased standing tolerance and increased ADL ability. Baseline: 5/7:see chart above Goal status: In-progress   3.  Patient will report a worst pain of 1/10 on VAS in             to improve tolerance with ADLs and reduced symptoms with activities.  Baseline: 5/7: worst 3/10 Goal status: In-progress       PLAN:   PT FREQUENCY: 1-2x/week   PT DURATION: 6 weeks   PLANNED INTERVENTIONS: Therapeutic exercises, Therapeutic activity, Neuromuscular re-education, Balance training, Gait training, Patient/Family education, Self Care, Joint mobilization, Stair training, Dry Needling, Spinal manipulation, Spinal mobilization, Cryotherapy, Moist heat, Traction, Ultrasound, Ionotophoresis 4mg /ml Dexamethasone, and Manual therapy   PLAN FOR NEXT SESSION: Update HEP as appropriate,  progressive core/LE/functional strengthening, motor control, and ROM exercises as tolerated. Education. Manual therapy and/or dry needling as needed.     Cira Rue, PT, DPT 07/07/2022, 7:26 PM  [facility] [address] [place], [place] [address] P: [phone] I F: [phone]

## 2022-07-07 ENCOUNTER — Encounter: Payer: Self-pay | Admitting: Physical Therapy

## 2022-07-07 ENCOUNTER — Ambulatory Visit: Payer: Managed Care, Other (non HMO) | Admitting: Physical Therapy

## 2022-07-07 VITALS — BP 131/84 | HR 80

## 2022-07-07 DIAGNOSIS — M25552 Pain in left hip: Secondary | ICD-10-CM | POA: Diagnosis not present

## 2022-07-07 DIAGNOSIS — M6281 Muscle weakness (generalized): Secondary | ICD-10-CM

## 2022-07-09 ENCOUNTER — Ambulatory Visit
Admission: RE | Admit: 2022-07-09 | Discharge: 2022-07-09 | Disposition: A | Discharge status: Home / Self Care | Payer: Managed Care, Other (non HMO) | Source: Ambulatory Visit | Attending: Hematology and Oncology | Admitting: Hematology and Oncology

## 2022-07-09 DIAGNOSIS — Z9189 Other specified personal risk factors, not elsewhere classified: Secondary | ICD-10-CM

## 2022-07-09 DIAGNOSIS — Z1589 Genetic susceptibility to other disease: Secondary | ICD-10-CM

## 2022-07-09 MED ORDER — GADOPICLENOL 0.5 MMOL/ML IV SOLN
7.0000 mL | Freq: Once | INTRAVENOUS | Status: AC | PRN
  Administered 2022-07-09: 7 mL via INTRAVENOUS

## 2022-07-11 ENCOUNTER — Encounter: Payer: Self-pay | Admitting: Physical Therapy

## 2022-07-11 ENCOUNTER — Ambulatory Visit: Payer: Managed Care, Other (non HMO) | Admitting: Physical Therapy

## 2022-07-11 ENCOUNTER — Encounter: Payer: Self-pay | Admitting: Hematology and Oncology

## 2022-07-11 VITALS — BP 135/73 | HR 83

## 2022-07-11 DIAGNOSIS — M25552 Pain in left hip: Secondary | ICD-10-CM

## 2022-07-11 DIAGNOSIS — M6281 Muscle weakness (generalized): Secondary | ICD-10-CM

## 2022-07-11 NOTE — Therapy (Signed)
OUTPATIENT PHYSICAL THERAPY TREATMENT NOTE   Patient Name: Alicia Carr MRN: [id number] DOB:09/18/1980, 42 y.o., female Today's Date: 07/11/2022  PCP: Jarold Motto, PA  REFERRING PROVIDER: Rodolph Bong, MD   END OF SESSION:   PT End of Session - 07/11/22 1742     Visit Number 3    Number of Visits 12    Date for PT Re-Evaluation 08/16/22    Authorization Type [employer] MANAGED reporting period from 07/05/2022    Authorization - Visit Number 3    Authorization - Number of Visits 30    Progress Note Due on Visit 10    PT Start Time 1738   pt needed to change after arrival   PT Stop Time 1816    PT Time Calculation (min) 38 min    Activity Tolerance Patient tolerated treatment well    Behavior During Therapy [facility] for tasks assessed/performed              Past Medical History:  Diagnosis Date   Alcoholism (HCC)    Anemia    Early hepatic fibrosis    Family history of ovarian cancer    Family history of prostate cancer    Gallstones    Genetic testing 11/29/2021   History of chicken pox    Hyperpigmentation    Longitudinal melanonychia    Nevus    right dorsal wrist   Positive ANA (antinuclear antibody)    Primary sclerosing cholangitis    Pruritus    Raynaud's syndrome    Shingles 07/18/2020   Vaginal delivery 2003   Past Surgical History:  Procedure Laterality Date   CESAREAN SECTION  08/08/2006   CHOLECYSTECTOMY  2003   COLONOSCOPY  09/2020   LIVER BIOPSY  05/2020   ROBOTIC ASSISTED SALPINGO OOPHERECTOMY Bilateral 12/03/2021   Procedure: XI ROBOTIC ASSISTED BILATERAL SALPINGECTOMY AND RIGHT OOPHORECTOMY;  Surgeon: Conard Novak, MD;  Location: [facility] ORS;  Service: Gynecology;  Laterality: Bilateral;   TUBAL LIGATION  2023   Tubes were actually removed due to cancer gene mutation   Patient Active Problem List   Diagnosis Date Noted   Right ovarian cyst 12/03/2021   Monoallelic mutation of RAD51C gene 11/30/2021   Genetic testing 11/29/2021    Family history of prostate cancer 11/04/2021   Family history of ovarian cancer 11/04/2021   Intolerant of cold 08/02/2021   Menorrhagia 08/02/2021   Folliculitis 06/10/2021   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 cause 06/18/2020    REFERRING DIAG: chronic left hip pain   THERAPY DIAG:  Pain in left hip  Muscle weakness (generalized)  Rationale for Evaluation and Treatment: Rehabilitation  PERTINENT HISTORY: 42 y/o female presents to outpatient physical therapy with a referral for medical diagnosis of L trochanteric bursitis 2/2 chronic L hip pain. Patient's chief complaint consist of L hip pain with prolonged walking and stair negotiation, leading to the following functional deficits which include LE weakness, decreased flexibility, and decreased activity tolerance. PMH includes Raynaud's syndrome, anemia, primary sclerosing cholangitis, pain in multiple joints and has been seeing rheumatologist for investigative reasons but no positive findings yet   PRECAUTIONS: none  SUBJECTIVE:  SUBJECTIVE STATEMENT:  Patient reports she is feeling well upon arrival today. She was a little sore after last PT session but attributed it to stretching things she is not used to stretching. She did her HEP over the weekend except yesterday because it was Mother's day. She had a little trouble with tying the band for the banded exercise. She would like her BP to be taken again today since it was a little high at last visit.    PAIN:  NPRS: 0/10   OBJECTIVE:   Vitals:    07/11/22 1744  BP: 135/73  Pulse: 83  SpO2: 100%     TODAY'S TREATMENT:  Therapeutic exercise: to centralize symptoms and improve ROM, strength, muscular endurance, and activity tolerance required for successful completion of functional activities.  - vitals measurement per patient request.  - NuStep level 5 using bilateral upper and lower extremities. Seat/handle setting 5/6. For improved extremity mobility, muscular endurance, and activity tolerance; and to induce the analgesic effect of aerobic exercise, stimulate improved joint nutrition, and prepare body structures and systems for following interventions. x 5  minutes. Average SPM = 84. - hooklying B hip abduction against BlackTB, 1x10  - hooklying bridge, 1x10 with 5 second holds  - hookying bridge with isometric hip abduction against BlackTB, 1x10 with 5 second holds.  - hookying bridge with isometric hip abduciton against BlackTB, 1x10 with 5 second holds.  - half hooklying curl up, 1x5 at 15 seconds, strong cuing for TrA contraction.  - Education on HEP including handout   Pt required multimodal cuing for proper technique and to facilitate improved neuromuscular control, strength, range of motion, and functional ability resulting in improved performance and form.     PATIENT EDUCATION:  Education details: Exercise purpose/form. Self management techniques.  Education on HEP including handout  Person educated: Patient Education method: Explanation, Demonstration, and Handouts Education comprehension: verbalized understanding   HOME EXERCISE PROGRAM: Access Code: [email or web address] URL: [email or web address] Date: 07/11/2022 Prepared by: Norton Blizzard  Exercises - Bridge with Hip Abduction and Resistance - Ground Touches  - 1 x daily - 1 sets - 20 reps - 5 seconds hold - Neutral Curl Up with Arms Across Chest  - 1 x daily - 5 reps - 15 seconds hold - Supine Quad Set  - 1 x daily - 1 sets - 20 reps - 5 seconds hold -  Figure 4 stretch  - 1 x daily - 2-3 reps - 30 seconds hold   ASSESSMENT:   CLINICAL IMPRESSION: Patient arrives with report of no pain today and is less anxious. She reported more success with HEP, but needed significant cuing to perform exercises in the clinic. She appears anxious and has trouble remembering exercises without the instructions. She is also easily overwhelmed with cuing, but improves her form with excellent effort and practice during session. HEP updated to reflect advances in session. Patient demonstrated trembling that shows her muscles are being appropriately challenged. Patient would benefit from continued management of limiting condition by skilled physical therapist to address remaining impairments and functional limitations to work towards stated goals and return to PLOF or maximal functional independence.   From initial PT eval 07/05/2022:  Patient is a 42 y.o. female who was seen today for physical therapy evaluation and treatment for L hip pain. Patient presents with muscle tightness, B LE functional weakness, decreased activity tolerance, and pain with prolonged activity. Notable L piriformis and L lumbar paraspinal tightness with palpation and special test. Denies radiating pain  in L LE with lumbar mobilizations. Patient denies pain this session but typically prolonged ambulation is primary aggravating factor. Patient will benefit from skilled therapy to address deficits in order to improve quality of life and return to PLOF.     OBJECTIVE IMPAIRMENTS: Abnormal gait, decreased activity tolerance, decreased balance, decreased endurance, decreased mobility, difficulty walking, decreased ROM, decreased strength, impaired flexibility, and pain.    ACTIVITY LIMITATIONS: bending, standing, squatting, sleeping, and stairs   PARTICIPATION LIMITATIONS: community activity and yard work   PERSONAL FACTORS: Age, Education, Fitness, Sex, and Social background are also affecting patient's  functional outcome.    REHAB POTENTIAL: Good   CLINICAL DECISION MAKING: Stable/uncomplicated   EVALUATION COMPLEXITY: Low     GOALS: Goals reviewed with patient? Yes   SHORT TERM GOALS: Target date: 07/19/2022   Patient will be independent in HEP to improve strength/mobility for better functional independence with ADLs. Baseline: 5/7 provided HEP Goal status: in-progress     LONG TERM GOALS: Target date: 08/16/2022   Patient will increase FOTO score to equal to or greater than  62   to demonstrate statistically significant improvement in mobility and quality of life.  Baseline: 5/7: 57 Goal status: In-progress   2.  Patient will increase BLE gross strength to >4+/5 as to improve functional strength for independent gait, increased standing tolerance and increased ADL ability. Baseline: 5/7:see chart above Goal status: In-progress   3.  Patient will report a worst pain of 1/10 on VAS in             to improve tolerance with ADLs and reduced symptoms with activities.  Baseline: 5/7: worst 3/10 Goal status: In-progress       PLAN:   PT FREQUENCY: 1-2x/week   PT DURATION: 6 weeks   PLANNED INTERVENTIONS: Therapeutic exercises, Therapeutic activity, Neuromuscular re-education, Balance training, Gait training, Patient/Family education, Self Care, Joint mobilization, Stair training, Dry Needling, Spinal manipulation, Spinal mobilization, Cryotherapy, Moist heat, Traction, Ultrasound, Ionotophoresis 4mg /ml Dexamethasone, and Manual therapy   PLAN FOR NEXT SESSION: Update HEP as appropriate, progressive core/LE/functional strengthening, motor control, and ROM exercises as tolerated. Education. Manual therapy and/or dry needling as needed.     Cira Rue, PT, DPT 07/11/2022, 8:05 PM  [facility] [address] [place], [place] [address] P: [phone] I F: [phone]

## 2022-07-14 ENCOUNTER — Ambulatory Visit: Payer: Managed Care, Other (non HMO) | Admitting: Physical Therapy

## 2022-07-18 ENCOUNTER — Ambulatory Visit: Payer: Managed Care, Other (non HMO) | Admitting: Physical Therapy

## 2022-07-18 ENCOUNTER — Encounter: Payer: Self-pay | Admitting: Physical Therapy

## 2022-07-18 VITALS — BP 126/68 | HR 91

## 2022-07-18 DIAGNOSIS — M6281 Muscle weakness (generalized): Secondary | ICD-10-CM

## 2022-07-18 DIAGNOSIS — M25552 Pain in left hip: Secondary | ICD-10-CM

## 2022-07-18 NOTE — Therapy (Signed)
OUTPATIENT PHYSICAL THERAPY TREATMENT NOTE   Patient Name: YILIN WALLO MRN: [id number] DOB:[birth year], 42 y.o., female Today's Date: 07/18/2022  PCP: Jarold Motto, PA  REFERRING PROVIDER: Rodolph Bong, MD   END OF SESSION:   PT End of Session - 07/18/22 1526     Visit Number 4    Number of Visits 12    Date for PT Re-Evaluation 08/16/22    Authorization Type [employer] MANAGED reporting period from 07/05/2022    Authorization - Visit Number 4    Authorization - Number of Visits 30    Progress Note Due on Visit 10    PT Start Time 1527   patient late   PT Stop Time 1555    PT Time Calculation (min) 28 min    Activity Tolerance Patient tolerated treatment well    Behavior During Therapy [facility] for tasks assessed/performed               Past Medical History:  Diagnosis Date   Alcoholism (HCC)    Anemia    Early hepatic fibrosis    Family history of ovarian cancer    Family history of prostate cancer    Gallstones    Genetic testing 11/29/2021   History of chicken pox    Hyperpigmentation    Longitudinal melanonychia    Nevus    right dorsal wrist   Positive ANA (antinuclear antibody)    Primary sclerosing cholangitis    Pruritus    Raynaud's syndrome    Shingles 07/18/2020   Vaginal delivery 2003   Past Surgical History:  Procedure Laterality Date   CESAREAN SECTION  08/08/2006   CHOLECYSTECTOMY  2003   COLONOSCOPY  09/2020   LIVER BIOPSY  05/2020   ROBOTIC ASSISTED SALPINGO OOPHERECTOMY Bilateral 12/03/2021   Procedure: XI ROBOTIC ASSISTED BILATERAL SALPINGECTOMY AND RIGHT OOPHORECTOMY;  Surgeon: Conard Novak, MD;  Location: [facility] ORS;  Service: Gynecology;  Laterality: Bilateral;   TUBAL LIGATION  2023   Tubes were actually removed due to cancer gene mutation   Patient Active Problem List   Diagnosis Date Noted   Right ovarian cyst 12/03/2021   Monoallelic mutation of RAD51C gene 11/30/2021   Genetic testing 11/29/2021   Family history of  prostate cancer 11/04/2021   Family history of ovarian cancer 11/04/2021   Intolerant of cold 08/02/2021   Menorrhagia 08/02/2021   Folliculitis 06/10/2021   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 cause 06/18/2020    REFERRING DIAG: chronic left hip pain   THERAPY DIAG:  Pain in left hip  Muscle weakness (generalized)  Rationale for Evaluation and Treatment: Rehabilitation  PERTINENT HISTORY: 42 y/o female presents to outpatient physical therapy with a referral for medical diagnosis of L trochanteric bursitis 2/2 chronic L hip pain. Patient's chief complaint consist of L hip pain with prolonged walking and stair negotiation, leading to the following functional deficits which include LE weakness, decreased flexibility, and decreased activity tolerance. PMH includes Raynaud's syndrome, anemia, primary sclerosing cholangitis, pain in multiple joints and has been seeing rheumatologist for investigative reasons but no positive findings yet   PRECAUTIONS: none  SUBJECTIVE:  SUBJECTIVE STATEMENT:  Patient reports she has some stiffness and discomfort near the left greater trochanter that started during the weekend. She worked at graduation at [employer] this weekend.  She has had this pain before and seems related to what she came to PT for. She states it is not the same as the burning pain. The burning pain occurs more when she takes their dog for a 2.5 mile walk. She states she felt okay after her last PT session. Her HEP are going okay but she does not think  she is doing them right.    PAIN:  NPRS: 2/10 discomfort/stiffness near the left greater trochanter   OBJECTIVE:   Vitals:   07/18/22 1542  BP: 126/68  Pulse: 91  SpO2: 100%     TODAY'S TREATMENT:  Therapeutic exercise: to centralize symptoms and improve ROM, strength, muscular endurance, and activity tolerance required for successful completion of functional activities.  - NuStep level 5 using bilateral upper and lower extremities. Seat/handle setting 5/6. For improved extremity mobility, muscular endurance, and activity tolerance; and to induce the analgesic effect of aerobic exercise, stimulate improved joint nutrition, and prepare body structures and systems for following interventions. x 5  minutes. Average SPM = 95. - vitals check per patient request (see above).  - half hooklying curl up, 1x15 seconds, 4x30 seconds, moderate cuing for TrA contraction.  - squat with buttocks tap on 18.5 inch plinth. 1x10 (knee pain).  - Squats with BUE support focusing on glute activation and proper form with hip hinging, stabilized back, and tibial perpendicular to floor with knees behind toes. 1x10 - hookying bridge with hip abduction (no TB) to check form, 1x1 with 5 second holds.  - Education on HEP including handout   Pt required multimodal cuing for proper technique and to facilitate improved neuromuscular control, strength, range of motion, and functional ability resulting in improved performance and form.  PATIENT EDUCATION:  Education details: Exercise purpose/form. Self management techniques.  Reviewed cancelation/no-show policy with patient and confirmed patient has correct phone number for clinic; patient verbalized understanding (07/18/22). Person educated: Patient Education method: Explanation, Demonstration, and Handouts Education comprehension: verbalized understanding   HOME EXERCISE PROGRAM: Access Code: [email or web address] URL: [email or web address] Date:  07/18/2022 Prepared by: Norton Blizzard  Exercises - Bridge with Hip Abduction and Resistance - Ground Touches  - 1 x daily - 1 sets - 20 reps - 5 seconds hold - Neutral Curl Up with Arms Across Chest  - 1 x daily - 5 reps - 15 seconds hold - Figure 4 stretch  - 1 x daily - 2-3 reps - 30 seconds hold - Squat with Counter Support  - 1 x daily - 2 sets - 10 reps   ASSESSMENT:   CLINICAL IMPRESSION: Patient arrives late today so session shortened accordingly. Patient has pain consistent with greater trochanteric pain syndrome on the left side. Session focused on reviewing and progressing HEP, which patient continues to have difficulty performing correctly without cuing but is improving in carry over between sessions. She does do better with the handout and requests it during session. Patient with no change in pain by end of session. Patient would benefit from continued management of limiting condition by skilled physical therapist to address remaining impairments and functional limitations to work towards stated goals and return to PLOF or maximal functional independence.   From initial PT eval 07/05/2022:  Patient is a 42 y.o. female who was seen today for physical therapy evaluation and treatment for L  hip pain. Patient presents with muscle tightness, B LE functional weakness, decreased activity tolerance, and pain with prolonged activity. Notable L piriformis and L lumbar paraspinal tightness with palpation and special test. Denies radiating pain in L LE with lumbar mobilizations. Patient denies pain this session but typically prolonged ambulation is primary aggravating factor. Patient will benefit from skilled therapy to address deficits in order to improve quality of life and return to PLOF.     OBJECTIVE IMPAIRMENTS: Abnormal gait, decreased activity tolerance, decreased balance, decreased endurance, decreased mobility, difficulty walking, decreased ROM, decreased strength, impaired flexibility, and  pain.    ACTIVITY LIMITATIONS: bending, standing, squatting, sleeping, and stairs   PARTICIPATION LIMITATIONS: community activity and yard work   PERSONAL FACTORS: Age, Education, Fitness, Sex, and Social background are also affecting patient's functional outcome.    REHAB POTENTIAL: Good   CLINICAL DECISION MAKING: Stable/uncomplicated   EVALUATION COMPLEXITY: Low     GOALS: Goals reviewed with patient? Yes   SHORT TERM GOALS: Target date: 07/19/2022   Patient will be independent in HEP to improve strength/mobility for better functional independence with ADLs. Baseline: 5/7 provided HEP Goal status: in-progress     LONG TERM GOALS: Target date: 08/16/2022   Patient will increase FOTO score to equal to or greater than  62   to demonstrate statistically significant improvement in mobility and quality of life.  Baseline: 5/7: 57 Goal status: In-progress   2.  Patient will increase BLE gross strength to >4+/5 as to improve functional strength for independent gait, increased standing tolerance and increased ADL ability. Baseline: 5/7:see chart above Goal status: In-progress   3.  Patient will report a worst pain of 1/10 on VAS in             to improve tolerance with ADLs and reduced symptoms with activities.  Baseline: 5/7: worst 3/10 Goal status: In-progress       PLAN:   PT FREQUENCY: 1-2x/week   PT DURATION: 6 weeks   PLANNED INTERVENTIONS: Therapeutic exercises, Therapeutic activity, Neuromuscular re-education, Balance training, Gait training, Patient/Family education, Self Care, Joint mobilization, Stair training, Dry Needling, Spinal manipulation, Spinal mobilization, Cryotherapy, Moist heat, Traction, Ultrasound, Ionotophoresis 4mg /ml Dexamethasone, and Manual therapy   PLAN FOR NEXT SESSION: Update HEP as appropriate, progressive core/LE/functional strengthening, motor control, and ROM exercises as tolerated. Education. Manual therapy and/or dry needling as needed.      Cira Rue, PT, DPT 07/18/2022, 5:36 PM  [facility] [address] [place], [place] [address] P: [phone] I F: [phone]

## 2022-07-21 ENCOUNTER — Ambulatory Visit: Payer: Managed Care, Other (non HMO) | Admitting: Physical Therapy

## 2022-07-21 ENCOUNTER — Encounter: Payer: Self-pay | Admitting: Physical Therapy

## 2022-07-21 DIAGNOSIS — M25552 Pain in left hip: Secondary | ICD-10-CM | POA: Diagnosis not present

## 2022-07-21 DIAGNOSIS — M6281 Muscle weakness (generalized): Secondary | ICD-10-CM

## 2022-07-21 NOTE — Therapy (Signed)
OUTPATIENT PHYSICAL THERAPY TREATMENT NOTE   Patient Name: Alicia Carr MRN: [id number] DOB:Dec 05, 1980, 42 y.o., female Today's Date: 07/21/2022  PCP: Jarold Motto, PA  REFERRING PROVIDER: Rodolph Bong, MD   END OF SESSION:   PT End of Session - 07/21/22 1700     Visit Number 5    Number of Visits 12    Date for PT Re-Evaluation 08/16/22    Authorization Type [employer] MANAGED reporting period from 07/05/2022    Authorization - Visit Number 5    Authorization - Number of Visits 30    Progress Note Due on Visit 10    PT Start Time 1656    PT Stop Time 1730    PT Time Calculation (min) 34 min    Activity Tolerance Patient tolerated treatment well    Behavior During Therapy [facility] for tasks assessed/performed                Past Medical History:  Diagnosis Date   Alcoholism (HCC)    Anemia    Early hepatic fibrosis    Family history of ovarian cancer    Family history of prostate cancer    Gallstones    Genetic testing 11/29/2021   History of chicken pox    Hyperpigmentation    Longitudinal melanonychia    Nevus    right dorsal wrist   Positive ANA (antinuclear antibody)    Primary sclerosing cholangitis    Pruritus    Raynaud's syndrome    Shingles 07/18/2020   Vaginal delivery 2003   Past Surgical History:  Procedure Laterality Date   CESAREAN SECTION  08/08/2006   CHOLECYSTECTOMY  2003   COLONOSCOPY  09/2020   LIVER BIOPSY  05/2020   ROBOTIC ASSISTED SALPINGO OOPHERECTOMY Bilateral 12/03/2021   Procedure: XI ROBOTIC ASSISTED BILATERAL SALPINGECTOMY AND RIGHT OOPHORECTOMY;  Surgeon: Conard Novak, MD;  Location: [facility] ORS;  Service: Gynecology;  Laterality: Bilateral;   TUBAL LIGATION  2023   Tubes were actually removed due to cancer gene mutation   Patient Active Problem List   Diagnosis Date Noted   Right ovarian cyst 12/03/2021   Monoallelic mutation of RAD51C gene 11/30/2021   Genetic testing 11/29/2021   Family history of prostate  cancer 11/04/2021   Family history of ovarian cancer 11/04/2021   Intolerant of cold 08/02/2021   Menorrhagia 08/02/2021   Folliculitis 06/10/2021   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 cause 06/18/2020    REFERRING DIAG: chronic left hip pain   THERAPY DIAG:  Pain in left hip  Muscle weakness (generalized)  Rationale for Evaluation and Treatment: Rehabilitation  PERTINENT HISTORY: 42 y/o female presents to outpatient physical therapy with a referral for medical diagnosis of L trochanteric bursitis 2/2 chronic L hip pain. Patient's chief complaint consist of L hip pain with prolonged walking and stair negotiation, leading to the following functional deficits which include LE weakness, decreased flexibility, and decreased activity tolerance. PMH includes Raynaud's syndrome, anemia, primary sclerosing cholangitis, pain in multiple joints and has been seeing rheumatologist for investigative reasons but no positive findings yet   PRECAUTIONS: none  SUBJECTIVE:  SUBJECTIVE STATEMENT:  Patient reports she is doing well today. She has some period pain today but not concordant pain. She is not taking anything for the pain. She was sore in her knees after last PT session. She did her HEP except the squat exercise because her sink was lower than she thought would be helpful.    PAIN:  NPRS: no concordant pain.    OBJECTIVE  SELF-REPORTED FUNCTION FOTO score: 69/100 (hip questionnaire)   TODAY'S TREATMENT:  Therapeutic exercise: to centralize symptoms and  improve ROM, strength, muscular endurance, and activity tolerance required for successful completion of functional activities.  - NuStep level 5 using bilateral upper and lower extremities. Seat/handle setting 5/6. For improved extremity mobility, muscular endurance, and activity tolerance; and to induce the analgesic effect of aerobic exercise, stimulate improved joint nutrition, and prepare body structures and systems for following interventions. x 5  minutes. Average SPM = 89. - sled push: 2x25 feet with 20#, 3x25 feet with 90#, 3x25 feet with 50# (weights adjusted due to elbow pain - reports wrist pain at the end).  - hip hikes with foot on 2x4 inch board, U UE support, 2x9-10 each side.  - Squats with BUE support focusing on glute activation and proper form with hip hinging, stabilized back, and tibial perpendicular to floor with knees behind toes. 2x10. - hamstring curls on OMEGA machine, 2x10 with 15#  Pt required multimodal cuing for proper technique and to facilitate improved neuromuscular control, strength, range of motion, and functional ability resulting in improved performance and form.  PATIENT EDUCATION:  Education details: Exercise purpose/form. Self management techniques.  Reviewed cancelation/no-show policy with patient and confirmed patient has correct phone number for clinic; patient verbalized understanding (07/18/22). Person educated: Patient Education method: Explanation, Demonstration, and Handouts Education comprehension: verbalized understanding   HOME EXERCISE PROGRAM: Access Code: [email or web address] URL: [email or web address] Date: 07/18/2022 Prepared by: Norton Blizzard  Exercises - Bridge with Hip Abduction and Resistance - Ground Touches  - 1 x daily - 1 sets - 20 reps - 5 seconds hold - Neutral Curl Up with Arms Across Chest  - 1 x daily - 5 reps - 15 seconds hold - Figure 4 stretch  - 1 x daily - 2-3 reps - 30 seconds hold - Squat with Counter Support  - 1 x  daily - 2 sets - 10 reps   ASSESSMENT:   CLINICAL IMPRESSION: Patient presents with no concordant pain. Exercises more focused on hips and knees in upright posture per patient preference. Patient continues to require significant cuing for correct form. Exercises were modified as needed to avoid excessive discomfort. Patient reported strong muscle burn at lateral hips with hip hikes that resolved with rest. She felt almost light headed after the second set of squats and took a seated rest at that time. FOTO score for hip shows significant improvement from 57 to 69 today. Patient would benefit from continued management of limiting condition by skilled physical therapist to address remaining impairments and functional limitations to work towards stated goals and return to PLOF or maximal functional independence.   From initial PT eval 07/05/2022:  Patient is a 42 y.o. female who was seen today for physical therapy evaluation and treatment for L hip pain. Patient presents with muscle tightness, B LE functional weakness, decreased activity tolerance, and pain with prolonged activity. Notable L piriformis and L lumbar paraspinal tightness with palpation and special test. Denies radiating pain in L LE with lumbar mobilizations. Patient denies pain this session  but typically prolonged ambulation is primary aggravating factor. Patient will benefit from skilled therapy to address deficits in order to improve quality of life and return to PLOF.     OBJECTIVE IMPAIRMENTS: Abnormal gait, decreased activity tolerance, decreased balance, decreased endurance, decreased mobility, difficulty walking, decreased ROM, decreased strength, impaired flexibility, and pain.    ACTIVITY LIMITATIONS: bending, standing, squatting, sleeping, and stairs   PARTICIPATION LIMITATIONS: community activity and yard work   PERSONAL FACTORS: Age, Education, Fitness, Sex, and Social background are also affecting patient's functional outcome.     REHAB POTENTIAL: Good   CLINICAL DECISION MAKING: Stable/uncomplicated   EVALUATION COMPLEXITY: Low     GOALS: Goals reviewed with patient? Yes   SHORT TERM GOALS: Target date: 07/19/2022   Patient will be independent in HEP to improve strength/mobility for better functional independence with ADLs. Baseline: 5/7 provided HEP Goal status: in-progress     LONG TERM GOALS: Target date: 08/16/2022   Patient will increase FOTO score to equal to or greater than  62   to demonstrate statistically significant improvement in mobility and quality of life.  Baseline: 5/7: 57; 07/21/2022: 69;  Goal status: MET    2.  Patient will increase BLE gross strength to >4+/5 as to improve functional strength for independent gait, increased standing tolerance and increased ADL ability. Baseline: 5/7:see chart above Goal status: In-progress   3.  Patient will report a worst pain of 1/10 on VAS in             to improve tolerance with ADLs and reduced symptoms with activities.  Baseline: 5/7: worst 3/10 Goal status: In-progress       PLAN:   PT FREQUENCY: 1-2x/week   PT DURATION: 6 weeks   PLANNED INTERVENTIONS: Therapeutic exercises, Therapeutic activity, Neuromuscular re-education, Balance training, Gait training, Patient/Family education, Self Care, Joint mobilization, Stair training, Dry Needling, Spinal manipulation, Spinal mobilization, Cryotherapy, Moist heat, Traction, Ultrasound, Ionotophoresis 4mg /ml Dexamethasone, and Manual therapy   PLAN FOR NEXT SESSION: Update HEP as appropriate, progressive core/LE/functional strengthening, motor control, and ROM exercises as tolerated. Education. Manual therapy and/or dry needling as needed.     Cira Rue, PT, DPT 07/21/2022, 5:43 PM  [facility] [address] [place], [place] [address] P: [phone] I F: [phone]

## 2022-07-26 ENCOUNTER — Encounter: Payer: Self-pay | Admitting: Physical Therapy

## 2022-07-26 ENCOUNTER — Ambulatory Visit: Payer: Managed Care, Other (non HMO) | Admitting: Physical Therapy

## 2022-07-26 DIAGNOSIS — M25552 Pain in left hip: Secondary | ICD-10-CM | POA: Diagnosis not present

## 2022-07-26 DIAGNOSIS — M6281 Muscle weakness (generalized): Secondary | ICD-10-CM

## 2022-07-26 NOTE — Therapy (Signed)
OUTPATIENT PHYSICAL THERAPY TREATMENT NOTE   Patient Name: Alicia Carr MRN: [id number] DOB:[birth year], 42 y.o., female Today's Date: 07/26/2022  PCP: Jarold Motto, PA  REFERRING PROVIDER: Rodolph Bong, MD   END OF SESSION:   PT End of Session - 07/26/22 1756     Visit Number 6    Number of Visits 12    Date for PT Re-Evaluation 08/16/22    Authorization Type [employer] MANAGED reporting period from 07/05/2022    Authorization - Visit Number 6    Authorization - Number of Visits 30    Progress Note Due on Visit 10    PT Start Time 1735    PT Stop Time 1813    PT Time Calculation (min) 38 min    Activity Tolerance Patient tolerated treatment well    Behavior During Therapy [facility] for tasks assessed/performed                 Past Medical History:  Diagnosis Date   Alcoholism (HCC)    Anemia    Early hepatic fibrosis    Family history of ovarian cancer    Family history of prostate cancer    Gallstones    Genetic testing 11/29/2021   History of chicken pox    Hyperpigmentation    Longitudinal melanonychia    Nevus    right dorsal wrist   Positive ANA (antinuclear antibody)    Primary sclerosing cholangitis    Pruritus    Raynaud's syndrome    Shingles 07/18/2020   Vaginal delivery 2003   Past Surgical History:  Procedure Laterality Date   CESAREAN SECTION  08/08/2006   CHOLECYSTECTOMY  2003   COLONOSCOPY  09/2020   LIVER BIOPSY  05/2020   ROBOTIC ASSISTED SALPINGO OOPHERECTOMY Bilateral 12/03/2021   Procedure: XI ROBOTIC ASSISTED BILATERAL SALPINGECTOMY AND RIGHT OOPHORECTOMY;  Surgeon: Conard Novak, MD;  Location: [facility] ORS;  Service: Gynecology;  Laterality: Bilateral;   TUBAL LIGATION  2023   Tubes were actually removed due to cancer gene mutation   Patient Active Problem List   Diagnosis Date Noted   Right ovarian cyst 12/03/2021   Monoallelic mutation of RAD51C gene 11/30/2021   Genetic testing 11/29/2021   Family history of prostate  cancer 11/04/2021   Family history of ovarian cancer 11/04/2021   Intolerant of cold 08/02/2021   Menorrhagia 08/02/2021   Folliculitis 06/10/2021   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 cause 06/18/2020    REFERRING DIAG: chronic left hip pain   THERAPY DIAG:  Pain in left hip  Muscle weakness (generalized)  Rationale for Evaluation and Treatment: Rehabilitation  PERTINENT HISTORY: 42 y/o female presents to outpatient physical therapy with a referral for medical diagnosis of L trochanteric bursitis 2/2 chronic L hip pain. Patient's chief complaint consist of L hip pain with prolonged walking and stair negotiation, leading to the following functional deficits which include LE weakness, decreased flexibility, and decreased activity tolerance. PMH includes Raynaud's syndrome, anemia, primary sclerosing cholangitis, pain in multiple joints and has been seeing rheumatologist for investigative reasons but no positive findings yet   PRECAUTIONS: none  SUBJECTIVE:  SUBJECTIVE STATEMENT:  Patient reports she currently has some concordant discomfort in her left lateral hip. She states she was sore there for about a day after last PT session but it was not too bad. She states she walked a lot over the weekend but her hip did not hurt as much as before. She states she did not do any HEP since Friday since having out of state company over and she forgot to do it. She is curious if she is walking funny that it could cause pain. She went to the eye doctor and she is having  trouble with gaze stabilization intermittently and she needs to see a specialist at [facility]. She also has some tinnitus in her left ear. She think it might be causing a balance issue that could affect her hip. Her left eye seems to be the problem and the left eye was the one affected by shingles. She had shingles infection that affected the left ear.    PAIN:  NPRS: 2/10 left lateral hip.    OBJECTIVE  TODAY'S TREATMENT:  Therapeutic exercise: to centralize symptoms and improve ROM, strength, muscular endurance, and activity tolerance required for successful completion of functional activities.  - NuStep level 5 using bilateral upper and lower extremities. Seat/handle setting 5/6. For improved extremity mobility, muscular endurance, and activity tolerance; and to induce the analgesic effect of aerobic exercise, stimulate improved joint nutrition, and prepare body structures and systems for following interventions. x 5  minutes. Average SPM = 86. - sled push: 6x25 feet with 50# from vertical poles.   Superset: - hip hikes with foot on 2x4 inch board, U UE support, 2x10 each side.  - Squats with BUE support focusing on glute activation and proper form with hip hinging, stabilized back, and tibial perpendicular to floor with knees behind toes. 2x10.  Superset:  - seated B knee extension on OMEGA machine, seat position 1, 2x10 with 5# - hamstring curls on OMEGA machine, seat position 1, 2x10 with 15#  - single leg stance ball toss at rebounder with small pink theraball, 2x10 each side after warmup with double leg stance first.   Pt required multimodal cuing for proper technique and to facilitate improved neuromuscular control, strength, range of motion, and functional ability resulting in improved performance and form.  PATIENT EDUCATION:  Education details: Exercise purpose/form. Self management techniques.  Reviewed cancelation/no-show policy with patient and confirmed patient has correct phone  number for clinic; patient verbalized understanding (07/18/22). Person educated: Patient Education method: Explanation, Demonstration, and Handouts Education comprehension: verbalized understanding   HOME EXERCISE PROGRAM: Access Code: [email or web address] URL: [email or web address] Date: 07/26/2022 Prepared by: Norton Blizzard  Exercises - Bridge with Hip Abduction and Resistance - Ground Touches  - 1 x daily - 1 sets - 20 reps - 5 seconds hold - Neutral Curl Up with Arms Across Chest  - 1 x daily - 5 reps - 15 seconds hold - Squat with Counter Support  - 1 x daily - 2 sets - 10 reps - Standing Hip Hiking  - 1 x daily - 2 sets - 10 reps   ASSESSMENT:   CLINICAL IMPRESSION: Patient arrives reporting improved ability to walk with less hip pain over the weekend since last PT session. Today's session continued with B LE strengthening with special attention to load the glute med for improved hip stability. Patient's gait screened and found to be within functional limits. Patient continues to report burning that is consistent appropriate muscle fatigue at end of  exercise sets. She had more difficulty standing on left LE compared to right. Patient would benefit from continued management of limiting condition by skilled physical therapist to address remaining impairments and functional limitations to work towards stated goals and return to PLOF or maximal functional independence.   From initial PT eval 07/05/2022:  Patient is a 42 y.o. female who was seen today for physical therapy evaluation and treatment for L hip pain. Patient presents with muscle tightness, B LE functional weakness, decreased activity tolerance, and pain with prolonged activity. Notable L piriformis and L lumbar paraspinal tightness with palpation and special test. Denies radiating pain in L LE with lumbar mobilizations. Patient denies pain this session but typically prolonged ambulation is primary aggravating factor. Patient will  benefit from skilled therapy to address deficits in order to improve quality of life and return to PLOF.     OBJECTIVE IMPAIRMENTS: Abnormal gait, decreased activity tolerance, decreased balance, decreased endurance, decreased mobility, difficulty walking, decreased ROM, decreased strength, impaired flexibility, and pain.    ACTIVITY LIMITATIONS: bending, standing, squatting, sleeping, and stairs   PARTICIPATION LIMITATIONS: community activity and yard work   PERSONAL FACTORS: Age, Education, Fitness, Sex, and Social background are also affecting patient's functional outcome.    REHAB POTENTIAL: Good   CLINICAL DECISION MAKING: Stable/uncomplicated   EVALUATION COMPLEXITY: Low     GOALS: Goals reviewed with patient? Yes   SHORT TERM GOALS: Target date: 07/19/2022   Patient will be independent in HEP to improve strength/mobility for better functional independence with ADLs. Baseline: 5/7 provided HEP Goal status: in-progress     LONG TERM GOALS: Target date: 08/16/2022   Patient will increase FOTO score to equal to or greater than  62   to demonstrate statistically significant improvement in mobility and quality of life.  Baseline: 5/7: 57; 07/21/2022: 69;  Goal status: MET    2.  Patient will increase BLE gross strength to >4+/5 as to improve functional strength for independent gait, increased standing tolerance and increased ADL ability. Baseline: 5/7:see chart above Goal status: In-progress   3.  Patient will report a worst pain of 1/10 on VAS in             to improve tolerance with ADLs and reduced symptoms with activities.  Baseline: 5/7: worst 3/10 Goal status: In-progress       PLAN:   PT FREQUENCY: 1-2x/week   PT DURATION: 6 weeks   PLANNED INTERVENTIONS: Therapeutic exercises, Therapeutic activity, Neuromuscular re-education, Balance training, Gait training, Patient/Family education, Self Care, Joint mobilization, Stair training, Dry Needling, Spinal  manipulation, Spinal mobilization, Cryotherapy, Moist heat, Traction, Ultrasound, Ionotophoresis 4mg /ml Dexamethasone, and Manual therapy   PLAN FOR NEXT SESSION: Update HEP as appropriate, progressive core/LE/functional strengthening, motor control, and ROM exercises as tolerated. Education. Manual therapy and/or dry needling as needed.     Cira Rue, PT, DPT 07/26/2022, 6:15 PM  [facility] [address] [place], [place] [address] P: [phone] I F: [phone]

## 2022-07-28 ENCOUNTER — Ambulatory Visit: Payer: Managed Care, Other (non HMO) | Admitting: Physical Therapy

## 2022-08-01 ENCOUNTER — Encounter: Payer: Self-pay | Admitting: Physical Therapy

## 2022-08-01 ENCOUNTER — Ambulatory Visit: Payer: Managed Care, Other (non HMO) | Attending: Family Medicine | Admitting: Physical Therapy

## 2022-08-01 DIAGNOSIS — M6281 Muscle weakness (generalized): Secondary | ICD-10-CM | POA: Diagnosis present

## 2022-08-01 DIAGNOSIS — M25552 Pain in left hip: Secondary | ICD-10-CM | POA: Diagnosis present

## 2022-08-01 NOTE — Therapy (Signed)
OUTPATIENT PHYSICAL THERAPY TREATMENT NOTE   Patient Name: Alicia Carr MRN: [id number] DOB:Sep 10, 1980, 42 y.o., female Today's Date: 08/01/2022  PCP: Jarold Motto, PA  REFERRING PROVIDER: Rodolph Bong, MD   END OF SESSION:   PT End of Session - 08/01/22 1703     Visit Number 7    Number of Visits 12    Date for PT Re-Evaluation 08/16/22    Authorization Type [employer] MANAGED reporting period from 07/05/2022    Authorization - Visit Number 7    Authorization - Number of Visits 30    Progress Note Due on Visit 10    PT Start Time 1650    PT Stop Time 1728    PT Time Calculation (min) 38 min    Activity Tolerance Patient tolerated treatment well    Behavior During Therapy [facility] for tasks assessed/performed                  Past Medical History:  Diagnosis Date   Alcoholism (HCC)    Anemia    Early hepatic fibrosis    Family history of ovarian cancer    Family history of prostate cancer    Gallstones    Genetic testing 11/29/2021   History of chicken pox    Hyperpigmentation    Longitudinal melanonychia    Nevus    right dorsal wrist   Positive ANA (antinuclear antibody)    Primary sclerosing cholangitis    Pruritus    Raynaud's syndrome    Shingles 07/18/2020   Vaginal delivery 2003   Past Surgical History:  Procedure Laterality Date   CESAREAN SECTION  08/08/2006   CHOLECYSTECTOMY  2003   COLONOSCOPY  09/2020   LIVER BIOPSY  05/2020   ROBOTIC ASSISTED SALPINGO OOPHERECTOMY Bilateral 12/03/2021   Procedure: XI ROBOTIC ASSISTED BILATERAL SALPINGECTOMY AND RIGHT OOPHORECTOMY;  Surgeon: Conard Novak, MD;  Location: [facility] ORS;  Service: Gynecology;  Laterality: Bilateral;   TUBAL LIGATION  2023   Tubes were actually removed due to cancer gene mutation   Patient Active Problem List   Diagnosis Date Noted   Right ovarian cyst 12/03/2021   Monoallelic mutation of RAD51C gene 11/30/2021   Genetic testing 11/29/2021   Family history of prostate  cancer 11/04/2021   Family history of ovarian cancer 11/04/2021   Intolerant of cold 08/02/2021   Menorrhagia 08/02/2021   Folliculitis 06/10/2021   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 cause 06/18/2020    REFERRING DIAG: chronic left hip pain   THERAPY DIAG:  Pain in left hip  Muscle weakness (generalized)  Rationale for Evaluation and Treatment: Rehabilitation  PERTINENT HISTORY: 42 y/o female presents to outpatient physical therapy with a referral for medical diagnosis of L trochanteric bursitis 2/2 chronic L hip pain. Patient's chief complaint consist of L hip pain with prolonged walking and stair negotiation, leading to the following functional deficits which include LE weakness, decreased flexibility, and decreased activity tolerance. PMH includes Raynaud's syndrome, anemia, primary sclerosing cholangitis, pain in multiple joints and has been seeing rheumatologist for investigative reasons but no positive findings yet   PRECAUTIONS: none  SUBJECTIVE:  SUBJECTIVE STATEMENT:  Patient reports she is doing okay except she re-irritated her turf-toe injury in her left foot. It is a workman's comp injury that she was told should just heal with time and rest. She has accidentally aggravated it twice when she trips. She missed her last PT session due to her toe pain and has not been walking much over the weekend. She was sore in her left glute and in her calves after last PT session. She did her mat exercises as part of her HEP but not standing up because  pressure on her toe is painful.    PAIN:  NPRS: 2/10 left toe    OBJECTIVE  TODAY'S TREATMENT:  Therapeutic exercise: to centralize symptoms and improve ROM, strength, muscular endurance, and activity tolerance required for successful completion of functional activities.  - NuStep level 5 using bilateral upper and lower extremities. Seat/handle setting 5/6. For improved extremity mobility, muscular endurance, and activity tolerance; and to induce the analgesic effect of aerobic exercise, stimulate improved joint nutrition, and prepare body structures and systems for following interventions. x 5  minutes. Average SPM = 86.  Superset: - hip hikes with foot on 2x4 inch board, U UE support, 3x10 each side.  - Squats with BUE support focusing on glute activation and proper form with hip hinging, stabilized back, and tibial perpendicular to floor with knees behind toes. 3x10.  - heel tap from bottom stair of 6-inch stair with B UE support, 1x10 each side (gaze stability issues).    Superset:  - seated B knee extension on OMEGA machine, seat position 1, 3x10 with 12/12/13# - hamstring curls on OMEGA machine, seat position 1, 3x10 with 20#  - single leg stance balance on firm surface, 3x30 seconds each side. UE support available. Unable to tolerate on airex pad due to toe pain and unable to perform ball tosses due to feeling of dizziness.    Pt required multimodal cuing for proper technique and to facilitate improved neuromuscular control, strength, range of motion, and functional ability resulting in improved performance and form.  PATIENT EDUCATION:  Education details: Exercise purpose/form. Self management techniques.  Reviewed cancelation/no-show policy with patient and confirmed patient has correct phone number for clinic; patient verbalized understanding (07/18/22). Person educated: Patient Education method: Explanation, Demonstration, and Handouts Education comprehension: verbalized  understanding   HOME EXERCISE PROGRAM: Access Code: [email or web address] URL: [email or web address] Date: 07/26/2022 Prepared by: Norton Blizzard  Exercises - Bridge with Hip Abduction and Resistance - Ground Touches  - 1 x daily - 1 sets - 20 reps - 5 seconds hold - Neutral Curl Up with Arms Across Chest  - 1 x daily - 5 reps - 15 seconds hold - Squat with Counter Support  - 1 x daily - 2 sets - 10 reps - Standing Hip Hiking  - 1 x daily - 2 sets - 10 reps   ASSESSMENT:   CLINICAL IMPRESSION: Patient arrives reporting decreased ability to participate in rehab due to re-irritation of left toe injury that is not part of current PT POC. Today's session focused on progression of exercises targeting hip, core, and LE to improve functional activity tolerance without further irritating toe. Patient continues to require frequent cuing for form but is demonstrating progressive gains in carry over of cuing from session to session. Patient would benefit from continued management of limiting condition by skilled physical therapist to address remaining impairments and functional limitations to work towards stated goals and return to PLOF or maximal functional independence.  From initial PT eval 07/05/2022:  Patient is a 42 y.o. female who was seen today for physical therapy evaluation and treatment for L hip pain. Patient presents with muscle tightness, B LE functional weakness, decreased activity tolerance, and pain with prolonged activity. Notable L piriformis and L lumbar paraspinal tightness with palpation and special test. Denies radiating pain in L LE with lumbar mobilizations. Patient denies pain this session but typically prolonged ambulation is primary aggravating factor. Patient will benefit from skilled therapy to address deficits in order to improve quality of life and return to PLOF.     OBJECTIVE IMPAIRMENTS: Abnormal gait, decreased activity tolerance, decreased balance, decreased endurance,  decreased mobility, difficulty walking, decreased ROM, decreased strength, impaired flexibility, and pain.    ACTIVITY LIMITATIONS: bending, standing, squatting, sleeping, and stairs   PARTICIPATION LIMITATIONS: community activity and yard work   PERSONAL FACTORS: Age, Education, Fitness, Sex, and Social background are also affecting patient's functional outcome.    REHAB POTENTIAL: Good   CLINICAL DECISION MAKING: Stable/uncomplicated   EVALUATION COMPLEXITY: Low     GOALS: Goals reviewed with patient? Yes   SHORT TERM GOALS: Target date: 07/19/2022   Patient will be independent in HEP to improve strength/mobility for better functional independence with ADLs. Baseline: 5/7 provided HEP Goal status: in-progress     LONG TERM GOALS: Target date: 08/16/2022   Patient will increase FOTO score to equal to or greater than  62   to demonstrate statistically significant improvement in mobility and quality of life.  Baseline: 5/7: 57; 07/21/2022: 69;  Goal status: MET    2.  Patient will increase BLE gross strength to >4+/5 as to improve functional strength for independent gait, increased standing tolerance and increased ADL ability. Baseline: 5/7:see chart above Goal status: In-progress   3.  Patient will report a worst pain of 1/10 on VAS in             to improve tolerance with ADLs and reduced symptoms with activities.  Baseline: 5/7: worst 3/10 Goal status: In-progress       PLAN:   PT FREQUENCY: 1-2x/week   PT DURATION: 6 weeks   PLANNED INTERVENTIONS: Therapeutic exercises, Therapeutic activity, Neuromuscular re-education, Balance training, Gait training, Patient/Family education, Self Care, Joint mobilization, Stair training, Dry Needling, Spinal manipulation, Spinal mobilization, Cryotherapy, Moist heat, Traction, Ultrasound, Ionotophoresis 4mg /ml Dexamethasone, and Manual therapy   PLAN FOR NEXT SESSION: Update HEP as appropriate, progressive core/LE/functional  strengthening, motor control, and ROM exercises as tolerated. Education. Manual therapy and/or dry needling as needed.     Cira Rue, PT, DPT 08/01/2022, 5:28 PM  [facility] [address] [place], [place] [address] P: [phone] I F: [phone]

## 2022-08-04 ENCOUNTER — Encounter: Payer: Managed Care, Other (non HMO) | Admitting: Physical Therapy

## 2022-08-04 ENCOUNTER — Encounter: Payer: Self-pay | Admitting: Physician Assistant

## 2022-08-04 NOTE — Telephone Encounter (Signed)
Please see pt msg and advise. Ok to have patient schedule appt to come into office to be seen as ED f/u?

## 2022-08-08 ENCOUNTER — Encounter: Payer: Managed Care, Other (non HMO) | Admitting: Physical Therapy

## 2022-08-09 ENCOUNTER — Encounter: Payer: Self-pay | Admitting: Physician Assistant

## 2022-08-09 ENCOUNTER — Ambulatory Visit: Payer: Managed Care, Other (non HMO) | Admitting: Physician Assistant

## 2022-08-09 VITALS — BP 122/70 | HR 82 | Temp 97.1°F | Ht 60.0 in | Wt 154.0 lb

## 2022-08-09 DIAGNOSIS — R239 Unspecified skin changes: Secondary | ICD-10-CM

## 2022-08-09 DIAGNOSIS — R0989 Other specified symptoms and signs involving the circulatory and respiratory systems: Secondary | ICD-10-CM | POA: Diagnosis not present

## 2022-08-09 DIAGNOSIS — R399 Unspecified symptoms and signs involving the genitourinary system: Secondary | ICD-10-CM

## 2022-08-09 DIAGNOSIS — R030 Elevated blood-pressure reading, without diagnosis of hypertension: Secondary | ICD-10-CM | POA: Diagnosis not present

## 2022-08-09 DIAGNOSIS — R42 Dizziness and giddiness: Secondary | ICD-10-CM

## 2022-08-09 DIAGNOSIS — R319 Hematuria, unspecified: Secondary | ICD-10-CM

## 2022-08-09 LAB — URINALYSIS, ROUTINE W REFLEX MICROSCOPIC
Bilirubin Urine: NEGATIVE
Ketones, ur: NEGATIVE
Leukocytes,Ua: NEGATIVE
Nitrite: NEGATIVE
Specific Gravity, Urine: 1.025 (ref 1.000–1.030)
Total Protein, Urine: NEGATIVE
Urine Glucose: NEGATIVE
Urobilinogen, UA: 0.2 (ref 0.0–1.0)
pH: 6 (ref 5.0–8.0)

## 2022-08-09 NOTE — Patient Instructions (Signed)
It was great to see you!  We can consider referral to Dr Langston Reusing with [facility] dermatology  We will update your urine today  Your blood pressure is GREAT  Take care,  Jarold Motto PA-C

## 2022-08-09 NOTE — Progress Notes (Signed)
Alicia Carr is a 42 y.o. female here for a follow up of a pre-existing problem.  History of Present Illness:   Chief Complaint  Patient presents with   Follow-up    Pt is here for ED f/u was seen 6/4 for dizziness. She is still having some dizziness off and on not sure if due to vision problem, is going to be seeing a specialist.    HPI   Patient reports that she got sick at work on Tuesday and her boss ended up taking her to the ER in [facility]). She suspects that this is related to an issue that has started that she reported to her eye doctor on May 20th (oscillating vision) and has been waiting for a specialist opthamology appointment to investigate further. She has yet to hear back from this office - states that they mentioned to her that they are behind on scheduling.  She has started having shaky/wobbly vision, return of tinnitus in her left ear that consists of a whooshing noise now in addition to the normal high pitch noise.   Her eye doctor mentioned it could be a vestibular issue and they said the same in the ER. She had normal MRI of brain in ER. Per ER: peripheral vertigo vs BPPV. Also consider vestibular neuritis.  She feels like her dizziness started during physical therapy when certain exercises were performed.  She continues to have skin changes. Has specialty dermatology appointment coming up in August and is hopeful to get answers.  Has seen another rheumatologist who did hormonal testing and all was normal.  Her blood pressure was elevated in the ER at 145/89 and was told in physical therapy that it was borderline elevated.   Past Medical History:  Diagnosis Date   Alcoholism (HCC)    Anemia    Early hepatic fibrosis    Family history of ovarian cancer    Family history of prostate cancer    Gallstones    Genetic testing 11/29/2021   History of chicken pox    Hyperpigmentation    Longitudinal melanonychia    Nevus    right dorsal wrist    Positive ANA (antinuclear antibody)    Primary sclerosing cholangitis    Pruritus    Raynaud's syndrome    Shingles 07/18/2020   Vaginal delivery 2003     Social History   Tobacco Use   Smoking status: Never    Passive exposure: Past   Smokeless tobacco: Never  Vaping Use   Vaping Use: Never used  Substance Use Topics   Alcohol use: Yes    Alcohol/week: 3.0 standard drinks of alcohol    Types: 3 Glasses of wine per week   Drug use: No    Past Surgical History:  Procedure Laterality Date   CESAREAN SECTION  08/08/2006   CHOLECYSTECTOMY  2003   COLONOSCOPY  09/2020   LIVER BIOPSY  05/2020   ROBOTIC ASSISTED SALPINGO OOPHERECTOMY Bilateral 12/03/2021   Procedure: XI ROBOTIC ASSISTED BILATERAL SALPINGECTOMY AND RIGHT OOPHORECTOMY;  Surgeon: Conard Novak, MD;  Location: [facility] ORS;  Service: Gynecology;  Laterality: Bilateral;   TUBAL LIGATION  2023   Tubes were actually removed due to cancer gene mutation    Family History  Problem Relation Age of Onset   Mental illness Mother        paranoid schizophrenia   Hyperlipidemia Mother    Alcohol abuse Father    Colon polyps Father    Hypertension Father  Prostate cancer Father 71   Healthy Daughter    Asthma Daughter    Learning disabilities Daughter    ADD / ADHD Daughter    Anxiety disorder Daughter    Diabetes Paternal Aunt    Cancer Maternal Grandmother        uncertain type   Heart murmur Brother    Healthy Son    Ovarian cancer Other        PGF sister   Breast cancer Neg Hx     No Known Allergies  Current Medications:   Current Outpatient Medications:    hydrocortisone (ANUSOL-HC) 2.5 % rectal cream, Place 1 Application rectally 2 (two) times daily. For 1 week then prn, Disp: 30 g, Rfl: 1   loteprednol (LOTEMAX) 0.5 % ophthalmic suspension, Place 1 drop into the left eye. Three times a week, Disp: , Rfl:    Review of Systems:   ROS Negative unless otherwise specified per HPI.  Vitals:    Vitals:   08/09/22 1307  BP: 122/70  Pulse: 82  Temp: (!) 97.1 F (36.2 C)  TempSrc: Temporal  SpO2: 100%  Weight: 154 lb (69.9 kg)  Height: 5' (1.524 m)     Body mass index is 30.08 kg/m.  Physical Exam:   Physical Exam Constitutional:      Appearance: Normal appearance. She is well-developed.  HENT:     Head: Normocephalic and atraumatic.  Eyes:     General: Lids are normal.     Extraocular Movements: Extraocular movements intact.     Conjunctiva/sclera: Conjunctivae normal.  Pulmonary:     Effort: Pulmonary effort is normal.  Musculoskeletal:        General: Normal range of motion.     Cervical back: Normal range of motion and neck supple.  Skin:    General: Skin is warm and dry.  Neurological:     Mental Status: She is alert and oriented to person, place, and time.  Psychiatric:        Attention and Perception: Attention and perception normal.        Mood and Affect: Mood normal.        Behavior: Behavior normal.        Thought Content: Thought content normal.        Judgment: Judgment normal.     Assessment and Plan:   Recent skin changes Seeing new derm in August for second opinion Did discuss seeing Dr Onalee Hua at [facility], but we will wait on already scheduled appointment at this time  Urinary symptom or sign She is requesting urinalysis today -- this has been ordered  Vein symptom Discussed Unclear etiology Continue to monitor sx  Elevated blood pressure, situational Normotensive today in office Provided reassurance  Dizziness No red flags - MRI was WNL Consider vestibular rehab vs neurology follow-up  I spent a total of 46 minutes on this visit, today 08/09/22, which included reviewing previous notes from Emergency Department on 08/02/22 and rheumatology on 06/14/22, ordering tests, discussing plan of care with patient and using shared-decision making on next steps, and documenting the findings in the note.   Jarold Motto, PA-C

## 2022-08-11 ENCOUNTER — Ambulatory Visit: Payer: Managed Care, Other (non HMO) | Admitting: Physical Therapy

## 2022-08-11 ENCOUNTER — Encounter: Payer: Self-pay | Admitting: Physical Therapy

## 2022-08-11 DIAGNOSIS — M6281 Muscle weakness (generalized): Secondary | ICD-10-CM

## 2022-08-11 DIAGNOSIS — M25552 Pain in left hip: Secondary | ICD-10-CM

## 2022-08-11 NOTE — Therapy (Signed)
OUTPATIENT PHYSICAL THERAPY TREATMENT NOTE   Patient Name: TAPANGA SHERRATT MRN: [id number] DOB:02-05-1981, 42 y.o., female Today's Date: 08/11/2022  PCP: Jarold Motto, PA  REFERRING PROVIDER: Rodolph Bong, MD   END OF SESSION:   PT End of Session - 08/11/22 1913     Visit Number 8    Number of Visits 12    Date for PT Re-Evaluation 08/16/22    Authorization Type [employer] MANAGED reporting period from 07/05/2022    Authorization - Visit Number 8    Authorization - Number of Visits 30    Progress Note Due on Visit 10    PT Start Time 1652    PT Stop Time 1730    PT Time Calculation (min) 38 min    Activity Tolerance No increased pain   limited by nausea and dizziness   Behavior During Therapy [facility] for tasks assessed/performed                   Past Medical History:  Diagnosis Date   Alcoholism (HCC)    Anemia    Early hepatic fibrosis    Family history of ovarian cancer    Family history of prostate cancer    Gallstones    Genetic testing 11/29/2021   History of chicken pox    Hyperpigmentation    Longitudinal melanonychia    Nevus    right dorsal wrist   Positive ANA (antinuclear antibody)    Primary sclerosing cholangitis    Pruritus    Raynaud's syndrome    Shingles 07/18/2020   Vaginal delivery 2003   Past Surgical History:  Procedure Laterality Date   CESAREAN SECTION  08/08/2006   CHOLECYSTECTOMY  2003   COLONOSCOPY  09/2020   LIVER BIOPSY  05/2020   ROBOTIC ASSISTED SALPINGO OOPHERECTOMY Bilateral 12/03/2021   Procedure: XI ROBOTIC ASSISTED BILATERAL SALPINGECTOMY AND RIGHT OOPHORECTOMY;  Surgeon: Conard Novak, MD;  Location: [facility] ORS;  Service: Gynecology;  Laterality: Bilateral;   TUBAL LIGATION  2023   Tubes were actually removed due to cancer gene mutation   Patient Active Problem List   Diagnosis Date Noted   Right ovarian cyst 12/03/2021   Monoallelic mutation of RAD51C gene 11/30/2021   Genetic testing 11/29/2021   Family  history of prostate cancer 11/04/2021   Family history of ovarian cancer 11/04/2021   Intolerant of cold 08/02/2021   Menorrhagia 08/02/2021   Folliculitis 06/10/2021   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 cause 06/18/2020    REFERRING DIAG: chronic left hip pain   THERAPY DIAG:  Pain in left hip  Muscle weakness (generalized)  Rationale for Evaluation and Treatment: Rehabilitation  PERTINENT HISTORY: 42 y/o female presents to outpatient physical therapy with a referral for medical diagnosis of L trochanteric bursitis 2/2 chronic L hip pain. Patient's chief complaint consist of L hip pain with prolonged walking and stair negotiation, leading to the following functional deficits which include LE weakness, decreased flexibility, and decreased activity tolerance. PMH includes Raynaud's syndrome, anemia, primary sclerosing cholangitis, pain in multiple joints and has been seeing rheumatologist for investigative reasons but no positive findings yet   PRECAUTIONS: none  SUBJECTIVE:  SUBJECTIVE STATEMENT:  Patient reports she never felt better after feeling dizzy and sick after she started feeling dizzy and unwell during the squat exercise last PT session. She went to bed and felt a little better when she got up but she started feeling worse and sick again after starting to work on the computer. She ended up going to urgent care, then the ED where they did a scan of her brain. The doctor said everything looked normal and that she might have a loose calcium crystal  in her ear. She was given meclizine and an anti-emetic that helped. She has not had to take the meclizine this week but can take it as needed. She followed up with her PCP who asked if this PT treats vestibular disorders and she said this PT does not specialize in it so she is getting at PT order for vestibular PT. She has felt a bit tongue tied since the episode and this has not improved. She states the rest of her has been fine. Her hip has been doing fine, but she has not been walking much or taking her dog for a walk because if she suddenly got dizzy and could not control her dog, the dog would be gone.   Vestibular subjective: patient reports she notices things are jumpy in her visual field and she feels nauseated when symptomatic. She states patterned floors bother her when symptomatic. She states taking meclizine helps. Her symptoms are intermittent. She denies spinning sensation or worsening of symptoms when lying down or turning over. Ringing in one of her ears has worsened when her dizziness symptoms have worsened. She states she gets floaters in her eyes but does not have a history of migraines.    PAIN:  NPRS: 0/10 left toe    OBJECTIVE   OCULOMOTOR / VESTIBULAR TESTING: Oculomotor Exam- Room Light  Normal Abnormal Comments  Ocular Alignment N    Ocular ROM N    Spontaneous Nystagmus N    Gaze evoked Nystagmus N  Patient with gradual onset of symptoms since starting exam  Smooth Pursuit  X Mild disruption smoothness especially R eye when going to the left  Saccades  X No abnormal saccades, increased symptoms.   VOR Cancellation  X Symptomatic but no visible nystagmus  Left Head Impulse   Symptomatic, no visible eye movement deviation  Right Head Impulse   Symptomatic, no visible eye movement deviation  Near point convergence N      BPPV TESTS:  Symptoms Duration Intensity Nystagmus  Left Sidelying Test None   None observed  Right Sidelying Test None   None observed  Left  Head Roll None   None observed  Right Head Roll None   None observed    TODAY'S TREATMENT:  Therapeutic exercise: to centralize symptoms and improve ROM, strength, muscular endurance, and activity tolerance required for successful completion of functional activities.  - NuStep level 5 using bilateral upper and lower extremities. Seat/handle setting 5/6. For improved extremity mobility, muscular endurance, and activity tolerance; and to induce the analgesic effect of aerobic exercise, stimulate improved joint nutrition, and prepare body structures and systems for following interventions. x 6:28  minutes. Average SPM = 80.   Neuromuscular Re-education: to improve, balance, postural strength, muscle activation patterns, and stabilization strength required for functional activities: - vestibular exam/screen (see above) - seated 1x Gaze stability exercise, 1x30 seconds.  - Education on diagnosis, prognosis, POC, anatomy and physiology of current condition.   Pt required multimodal cuing  for proper technique and to facilitate improved neuromuscular control, strength, range of motion, and functional ability resulting in improved performance and form.  PATIENT EDUCATION:  Education details: Exercise purpose/form. Self management techniques.  Reviewed cancelation/no-show policy with patient and confirmed patient has correct phone number for clinic; patient verbalized understanding (07/18/22). Person educated: Patient Education method: Explanation, Demonstration, and Handouts Education comprehension: verbalized understanding   HOME EXERCISE PROGRAM: Access Code: [email or web address] URL: [email or web address] Date: 07/26/2022 Prepared by: Norton Blizzard  Exercises - Bridge with Hip Abduction and Resistance - Ground Touches  - 1 x daily - 1 sets - 20 reps - 5 seconds hold - Neutral Curl Up with Arms Across Chest  - 1 x daily - 5 reps - 15 seconds hold - Squat with Counter Support  - 1 x daily - 2  sets - 10 reps - Standing Hip Hiking  - 1 x daily - 2 sets - 10 reps  Vestibular HEP: Access Code: [email or web address] URL: [email or web address] Date: 08/11/2022 Prepared by: Norton Blizzard  Exercises - Seated Gaze Stabilization with Head Rotation  - 3 x daily - 3 sets - 30 seconds time   ASSESSMENT:   CLINICAL IMPRESSION: Patient arrives following episodes of worsening dizziness and nausea after last PT session and when going to work the next day that prompted an ED visit where her MRI was negative. She has a new referral for dizziness and would benefit from seeing a vestibular specialist. However, her vestibular symptoms are impacting her ability to complete rehab for her hip and knee symptoms, so vestibular symptoms were screened by this PT who has some but not specialist knowledge of vestibular exam and rehab and is well equipped to screen for BPPV, which was suggested at ED visit.  Patient was negative for [facility] testing, suggesting she does not have BPPV. Her subjective and objective exam uncovered symptom with gaze stability and VOR, so she was provided with a HEP to start working on adaptation to help improve her ability to engage in daily life without symptoms. Patient's vestibular symptoms were significantly increased by the end of the examination and she took meclizine prior to driving home. Plan to integrate vestibular exercises into POC as needed until patient is able to get in with a vestibular specialist or she is able to complete her hip/knee rehab without limitation due to vestibular symptoms. Patient would benefit from continued management of limiting condition by skilled physical therapist to address remaining impairments and functional limitations to work towards stated goals and return to PLOF or maximal functional independence.    From initial PT eval 07/05/2022:  Patient is a 42 y.o. female who was seen today for physical therapy evaluation and treatment for L hip pain.  Patient presents with muscle tightness, B LE functional weakness, decreased activity tolerance, and pain with prolonged activity. Notable L piriformis and L lumbar paraspinal tightness with palpation and special test. Denies radiating pain in L LE with lumbar mobilizations. Patient denies pain this session but typically prolonged ambulation is primary aggravating factor. Patient will benefit from skilled therapy to address deficits in order to improve quality of life and return to PLOF.     OBJECTIVE IMPAIRMENTS: Abnormal gait, decreased activity tolerance, decreased balance, decreased endurance, decreased mobility, difficulty walking, decreased ROM, decreased strength, impaired flexibility, and pain.    ACTIVITY LIMITATIONS: bending, standing, squatting, sleeping, and stairs   PARTICIPATION LIMITATIONS: community activity and yard work   PERSONAL FACTORS: Age, Education, Fitness, Sex, and Social background are also affecting  patient's functional outcome.    REHAB POTENTIAL: Good   CLINICAL DECISION MAKING: Stable/uncomplicated   EVALUATION COMPLEXITY: Low     GOALS: Goals reviewed with patient? Yes   SHORT TERM GOALS: Target date: 07/19/2022   Patient will be independent in HEP to improve strength/mobility for better functional independence with ADLs. Baseline: 5/7 provided HEP Goal status: in-progress     LONG TERM GOALS: Target date: 08/16/2022   Patient will increase FOTO score to equal to or greater than  62   to demonstrate statistically significant improvement in mobility and quality of life.  Baseline: 5/7: 57; 07/21/2022: 69;  Goal status: MET    2.  Patient will increase BLE gross strength to >4+/5 as to improve functional strength for independent gait, increased standing tolerance and increased ADL ability. Baseline: 5/7:see chart above Goal status: In-progress   3.  Patient will report a worst pain of 1/10 on VAS in             to improve tolerance with ADLs and reduced  symptoms with activities.  Baseline: 5/7: worst 3/10 Goal status: In-progress       PLAN:   PT FREQUENCY: 1-2x/week   PT DURATION: 6 weeks   PLANNED INTERVENTIONS: Therapeutic exercises, Therapeutic activity, Neuromuscular re-education, Balance training, Gait training, Patient/Family education, Self Care, Joint mobilization, Stair training, Dry Needling, Spinal manipulation, Spinal mobilization, Cryotherapy, Moist heat, Traction, Ultrasound, Ionotophoresis 4mg /ml Dexamethasone, and Manual therapy   PLAN FOR NEXT SESSION: Update HEP as appropriate, progressive core/LE/functional strengthening, motor control, and ROM exercises as tolerated. Education. Manual therapy and/or dry needling as needed.     Cira Rue, PT, DPT 08/11/2022, 7:15 PM  [facility] [address] [place], [place] [address] P: [phone] I F: [phone]

## 2022-08-15 ENCOUNTER — Encounter: Payer: Self-pay | Admitting: Physical Therapy

## 2022-08-15 ENCOUNTER — Ambulatory Visit: Payer: Managed Care, Other (non HMO) | Admitting: Physical Therapy

## 2022-08-15 DIAGNOSIS — M25552 Pain in left hip: Secondary | ICD-10-CM | POA: Diagnosis not present

## 2022-08-15 DIAGNOSIS — M6281 Muscle weakness (generalized): Secondary | ICD-10-CM

## 2022-08-15 NOTE — Therapy (Signed)
OUTPATIENT PHYSICAL THERAPY TREATMENT NOTE   Patient Name: Alicia Carr MRN: [id number] DOB:December 07, 1980, 42 y.o., female Today's Date: 08/15/2022  PCP: Jarold Motto, PA  REFERRING PROVIDER: Rodolph Bong, MD   END OF SESSION:   PT End of Session - 08/15/22 1658     Visit Number 9    Number of Visits 12    Date for PT Re-Evaluation 08/16/22    Authorization Type [employer] MANAGED reporting period from 07/05/2022    Authorization - Visit Number 9    Authorization - Number of Visits 30    Progress Note Due on Visit 10    PT Start Time 1655    PT Stop Time 1727    PT Time Calculation (min) 32 min    Activity Tolerance No increased pain;Patient tolerated treatment well    Behavior During Therapy [facility] for tasks assessed/performed                    Past Medical History:  Diagnosis Date   Alcoholism (HCC)    Anemia    Early hepatic fibrosis    Family history of ovarian cancer    Family history of prostate cancer    Gallstones    Genetic testing 11/29/2021   History of chicken pox    Hyperpigmentation    Longitudinal melanonychia    Nevus    right dorsal wrist   Positive ANA (antinuclear antibody)    Primary sclerosing cholangitis    Pruritus    Raynaud's syndrome    Shingles 07/18/2020   Vaginal delivery 2003   Past Surgical History:  Procedure Laterality Date   CESAREAN SECTION  08/08/2006   CHOLECYSTECTOMY  2003   COLONOSCOPY  09/2020   LIVER BIOPSY  05/2020   ROBOTIC ASSISTED SALPINGO OOPHERECTOMY Bilateral 12/03/2021   Procedure: XI ROBOTIC ASSISTED BILATERAL SALPINGECTOMY AND RIGHT OOPHORECTOMY;  Surgeon: Conard Novak, MD;  Location: [facility] ORS;  Service: Gynecology;  Laterality: Bilateral;   TUBAL LIGATION  2023   Tubes were actually removed due to cancer gene mutation   Patient Active Problem List   Diagnosis Date Noted   Right ovarian cyst 12/03/2021   Monoallelic mutation of RAD51C gene 11/30/2021   Genetic testing 11/29/2021   Family  history of prostate cancer 11/04/2021   Family history of ovarian cancer 11/04/2021   Intolerant of cold 08/02/2021   Menorrhagia 08/02/2021   Folliculitis 06/10/2021   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 cause 06/18/2020    REFERRING DIAG: chronic left hip pain   THERAPY DIAG:  Pain in left hip  Muscle weakness (generalized)  Rationale for Evaluation and Treatment: Rehabilitation  PERTINENT HISTORY: 42 y/o female presents to outpatient physical therapy with a referral for medical diagnosis of L trochanteric bursitis 2/2 chronic L hip pain. Patient's chief complaint consist of L hip pain with prolonged walking and stair negotiation, leading to the following functional deficits which include LE weakness, decreased flexibility, and decreased activity tolerance. PMH includes Raynaud's syndrome, anemia, primary sclerosing cholangitis, pain in multiple joints and has been seeing rheumatologist for investigative reasons but no positive findings yet   PRECAUTIONS: none  SUBJECTIVE:  SUBJECTIVE STATEMENT:  Patient reports she is feeling okay, but after last PT session she had to stay in the parking lot for 30 min after taking meclizine before she was comfortable driving. She felt nausea all the way up until bedtime. She has not tried the vestibular HEP like she thinks she should because she is afraid of provoking symptoms. She has needed to take meclizine after her HEP.  Patient states her left hip is better but she cannot tell if it is because she has not been  walking the dog and she has also noticed it can be hard to find a good position to sleep in because of achiness there. She has not had burning pain for a while.    PAIN:  NPRS: 0/10   OBJECTIVE  TODAY'S TREATMENT:  Therapeutic exercise: to centralize symptoms and improve ROM, strength, muscular endurance, and activity tolerance required for successful completion of functional activities.  - NuStep level 5 using bilateral upper and lower extremities. Seat/handle setting 5/6. For improved extremity mobility, muscular endurance, and activity tolerance; and to induce the analgesic effect of aerobic exercise, stimulate improved joint nutrition, and prepare body structures and systems for following interventions. x 5 minutes. Average SPM = 84.  - hip hikes with foot on 2x4 inch board, U UE support, 3x15 each side.  - hip extension on hip machine, height 1, rotary position 3, arm position 1, 70#, 3x10 each side.  - hip abduction on hip machine, height 1, rotary position 10, arm position 1, 25#, 3x10 each side.   Pt required multimodal cuing for proper technique and to facilitate improved neuromuscular control, strength, range of motion, and functional ability resulting in improved performance and form.  PATIENT EDUCATION:  Education details: Exercise purpose/form. Self management techniques.  Reviewed cancelation/no-show policy with patient and confirmed patient has correct phone number for clinic; patient verbalized understanding (07/18/22). Person educated: Patient Education method: Explanation, Demonstration, and Handouts Education comprehension: verbalized understanding   HOME EXERCISE PROGRAM: Access Code: [email or web address] URL: [email or web address] Date: 07/26/2022 Prepared by: Norton Blizzard  Exercises - Bridge with Hip Abduction and Resistance - Ground Touches  - 1 x daily - 1 sets - 20 reps - 5 seconds hold - Neutral Curl Up with Arms Across Chest  - 1 x daily - 5 reps - 15  seconds hold - Squat with Counter Support  - 1 x daily - 2 sets - 10 reps - Standing Hip Hiking  - 1 x daily - 2 sets - 10 reps  Vestibular HEP: Access Code: [email or web address] URL: [email or web address] Date: 08/11/2022 Prepared by: Norton Blizzard  Exercises - Seated Gaze Stabilization with Head Rotation  - 3 x daily - 3 sets - 30 seconds time   ASSESSMENT:   CLINICAL IMPRESSION: Patient arrives reporting intolerable dizziness and nausea symptoms after last PT session and difficulty with vestibular exercises over the weekend due to symptoms. Patient educated on how to regress exercises to try to make them more tolerable. Today's session focused on strengthening L hip for improved pain. Patient had some fatigue and knee pain during exercises but no worse after. Patient reported no change in dizziness by end of session. Patient would benefit from continued management of limiting condition by skilled physical therapist to address remaining impairments and functional limitations to work towards stated goals and return to PLOF or maximal functional independence.   From initial PT eval 07/05/2022:  Patient is a 42 y.o. female who was seen today for physical therapy evaluation and treatment  for L hip pain. Patient presents with muscle tightness, B LE functional weakness, decreased activity tolerance, and pain with prolonged activity. Notable L piriformis and L lumbar paraspinal tightness with palpation and special test. Denies radiating pain in L LE with lumbar mobilizations. Patient denies pain this session but typically prolonged ambulation is primary aggravating factor. Patient will benefit from skilled therapy to address deficits in order to improve quality of life and return to PLOF.     OBJECTIVE IMPAIRMENTS: Abnormal gait, decreased activity tolerance, decreased balance, decreased endurance, decreased mobility, difficulty walking, decreased ROM, decreased strength, impaired flexibility, and  pain.    ACTIVITY LIMITATIONS: bending, standing, squatting, sleeping, and stairs   PARTICIPATION LIMITATIONS: community activity and yard work   PERSONAL FACTORS: Age, Education, Fitness, Sex, and Social background are also affecting patient's functional outcome.    REHAB POTENTIAL: Good   CLINICAL DECISION MAKING: Stable/uncomplicated   EVALUATION COMPLEXITY: Low     GOALS: Goals reviewed with patient? Yes   SHORT TERM GOALS: Target date: 07/19/2022   Patient will be independent in HEP to improve strength/mobility for better functional independence with ADLs. Baseline: 5/7 provided HEP Goal status: in-progress     LONG TERM GOALS: Target date: 08/16/2022   Patient will increase FOTO score to equal to or greater than  62   to demonstrate statistically significant improvement in mobility and quality of life.  Baseline: 5/7: 57; 07/21/2022: 69;  Goal status: MET    2.  Patient will increase BLE gross strength to >4+/5 as to improve functional strength for independent gait, increased standing tolerance and increased ADL ability. Baseline: 5/7:see chart above Goal status: In-progress   3.  Patient will report a worst pain of 1/10 on VAS in             to improve tolerance with ADLs and reduced symptoms with activities.  Baseline: 5/7: worst 3/10 Goal status: In-progress       PLAN:   PT FREQUENCY: 1-2x/week   PT DURATION: 6 weeks   PLANNED INTERVENTIONS: Therapeutic exercises, Therapeutic activity, Neuromuscular re-education, Balance training, Gait training, Patient/Family education, Self Care, Joint mobilization, Stair training, Dry Needling, Spinal manipulation, Spinal mobilization, Cryotherapy, Moist heat, Traction, Ultrasound, Ionotophoresis 4mg /ml Dexamethasone, and Manual therapy   PLAN FOR NEXT SESSION: Update HEP as appropriate, progressive core/LE/functional strengthening, motor control, and ROM exercises as tolerated. Education. Manual therapy and/or dry needling  as needed.     Cira Rue, PT, DPT 08/15/2022, 5:30 PM  [facility] [address] [place], [place] [address] P: [phone] I F: [phone]

## 2022-08-17 ENCOUNTER — Other Ambulatory Visit: Payer: Self-pay | Admitting: Nurse Practitioner

## 2022-08-17 DIAGNOSIS — K760 Fatty (change of) liver, not elsewhere classified: Secondary | ICD-10-CM | POA: Insufficient documentation

## 2022-08-17 DIAGNOSIS — K7401 Hepatic fibrosis, early fibrosis: Secondary | ICD-10-CM

## 2022-08-17 DIAGNOSIS — K8301 Primary sclerosing cholangitis: Secondary | ICD-10-CM

## 2022-08-18 ENCOUNTER — Ambulatory Visit: Payer: Managed Care, Other (non HMO) | Admitting: Physical Therapy

## 2022-08-23 ENCOUNTER — Ambulatory Visit
Admission: RE | Admit: 2022-08-23 | Discharge: 2022-08-23 | Disposition: A | Discharge status: Home / Self Care | Payer: Managed Care, Other (non HMO) | Source: Ambulatory Visit | Attending: Nurse Practitioner

## 2022-08-23 ENCOUNTER — Ambulatory Visit: Payer: Managed Care, Other (non HMO) | Admitting: Physical Therapy

## 2022-08-23 DIAGNOSIS — K760 Fatty (change of) liver, not elsewhere classified: Secondary | ICD-10-CM

## 2022-08-23 DIAGNOSIS — K8301 Primary sclerosing cholangitis: Secondary | ICD-10-CM

## 2022-08-23 DIAGNOSIS — K7401 Hepatic fibrosis, early fibrosis: Secondary | ICD-10-CM

## 2022-08-25 ENCOUNTER — Encounter: Payer: Managed Care, Other (non HMO) | Admitting: Physical Therapy

## 2022-08-30 ENCOUNTER — Ambulatory Visit: Payer: Managed Care, Other (non HMO) | Attending: Family Medicine

## 2022-08-30 DIAGNOSIS — M25552 Pain in left hip: Secondary | ICD-10-CM | POA: Diagnosis present

## 2022-08-30 DIAGNOSIS — M6281 Muscle weakness (generalized): Secondary | ICD-10-CM | POA: Insufficient documentation

## 2022-08-30 NOTE — Therapy (Signed)
OUTPATIENT PHYSICAL THERAPY TREATMENT/ DISCHARGE SUMMARY     Patient Name: Alicia Carr MRN: [id number] DOB:1980/05/08, 42 y.o., female Today's Date: 08/30/2022  PCP: Jarold Motto, PA  REFERRING PROVIDER: Rodolph Bong, MD   END OF SESSION:   PT End of Session - 08/30/22 1656     Visit Number 10    Number of Visits 12    Date for PT Re-Evaluation 08/16/22    Authorization Type [employer] MA[employer]ED reporting period from 07/05/2022    Authorization - Visit Number 10    Authorization - Number of Visits 30    Progress Note Due on Visit 10    PT Start Time 1645    PT Stop Time 1725    PT Time Calculation (min) 40 min    Activity Tolerance No increased pain;Patient tolerated treatment well    Behavior During Therapy [facility] for tasks assessed/performed             REFERRING DIAG: chronic left hip pain   THERAPY DIAG:  Pain in left hip  Muscle weakness (generalized)  Rationale for Evaluation and Treatment: Rehabilitation  PERTINENT HISTORY: 42 y/o female presents to outpatient physical therapy with a referral for medical diagnosis of L trochanteric bursitis 2/2 chronic L hip pain. Patient's chief complaint consist of L hip pain with prolonged walking and stair negotiation, leading to the following functional deficits which include LE weakness, decreased flexibility, and decreased activity tolerance. PMH includes Raynaud's syndrome, anemia, primary sclerosing cholangitis, pain in multiple joints and has been seeing rheumatologist for investigative reasons but no positive findings yet.   PRECAUTIONS: none  SUBJECTIVE:                                                                                                                                                                                      SUBJECTIVE STATEMENT:  Pt doing better overall, feels ready for DC for hip issue. Pt scheduled to start vestibular PT. Her visual oscillation has not improved, but she seems to be  accommodating to phenomenon as she can modify activity and avoid symptoms aggravation most of the time, still takes occasional meclizine PRN. Similar with left ear woosh/tinitus, comes and goes, seems to be worse when tired to coming out of sleep.   PAIN:  NPRS: 0/10  OBJECTIVE  TODAY'S TREATMENT:  FOTO survey MMT BLE Goals assessment   Hip hikes 1x5 bilat, then 1x5 bilat c 15lb axial load Seated hip IR with ball between knees 1x15 with YTB   HEP updates final   PATIENT EDUCATION:  Education details: Exercise purpose/form. Self management techniques.  Reviewed cancelation/no-show policy with patient and confirmed patient has correct  phone number for clinic; patient verbalized understanding (07/18/22). Person educated: Patient Education method: Explanation, Demonstration, and Handouts Education comprehension: verbalized understanding   HOME EXERCISE PROGRAM: Access Code: [email or web address] URL: [email or web address] Date: 08/30/2022 Prepared by: Alvera Novel  Exercises - Bridge with Hip Abduction and Resistance - Ground Touches  - 2-3 x weekly - 3 sets - 20 reps - 3sec hold - Standing Hip Hiking  - 2-3 x weekly - 3 sets - 15 reps - 10lb weight - Seated Hip Internal Rotation with Ball and Resistance  - 3 x daily - 2-3 x weekly - 3 sets - 15 reps - 3 hold  Vestibular HEP: Access Code: [email or web address] URL: [email or web address] Date: 08/11/2022 Prepared by: Norton Blizzard  Exercises - Seated Gaze Stabilization with Head Rotation  - 3 x daily - 3 sets - 30 seconds time   ASSESSMENT:   CLINICAL IMPRESSION:  Pt has made excellent progress toward goal of treatment in hip pain issue. HEP is updated and pt educated on how to advance over next couple months. Discussed importance of continued progressive loading to improve collagen quality of gluteal tendon and improve liklihood of optimal long term outcomes. Pt to be DC at this time. She is pleased with progress and feels  to be ready. Pt to see vestibular PT for eval in coming weeks.    OBJECTIVE IMPAIRMENTS: Abnormal gait, decreased activity tolerance, decreased balance, decreased endurance, decreased mobility, difficulty walking, decreased ROM, decreased strength, impaired flexibility, and pain.    ACTIVITY LIMITATIONS: bending, standing, squatting, sleeping, and stairs   PARTICIPATION LIMITATIONS: community activity and yard work   PERSONAL FACTORS: Age, Education, Fitness, Sex, and Social background are also affecting patient's functional outcome.    REHAB POTENTIAL: Good   CLINICAL DECISION MAKING: Stable/uncomplicated   EVALUATION COMPLEXITY: Low     GOALS: Goals reviewed with patient? Yes   SHORT TERM GOALS: Target date: 07/19/2022   Patient will be independent in HEP to improve strength/mobility for better functional independence with ADLs. Baseline: 5/7 provided HEP Goal status: achieved     LONG TERM GOALS: Target date: 08/16/2022   Patient will increase FOTO score to equal to or greater than  62   to demonstrate statistically significant improvement in mobility and quality of life.  Baseline: 5/7: 57; 07/21/2022: 69; 08/30/22: 91 Goal status: MET    2.  Patient will increase BLE gross strength to >4+/5 as to improve functional strength for independent gait, increased standing tolerance and increased ADL ability. Baseline: 5/7:see chart above; all 5/5 7/2 Goal status: achieved   3.  Patient will report a worst pain of 1/10 on VAS in             to improve tolerance with ADLs and reduced symptoms with activities.  Baseline: 5/7: worst 3/10; 08/30/22: no pain  Goal status: In-achievd       PLAN:   PT FREQUENCY: 1-2x/week   PT DURATION: 6 weeks   PLANNED INTERVENTIONS: Therapeutic exercises, Therapeutic activity, Neuromuscular re-education, Balance training, Gait training, Patient/Family education, Self Care, Joint mobilization, Stair training, Dry Needling, Spinal manipulation,  Spinal mobilization, Cryotherapy, Moist heat, Traction, Ultrasound, Ionotophoresis 4mg /ml Dexamethasone, and Manual therapy    6:55 PM, 08/30/22 Rosamaria Lints, PT, DPT Physical Therapist -  [phone] (Office)   Rosamaria Lints, PT, DPT 08/30/2022, 5:02 PM  [facility] [address] [place], [place] [address] P: [phone] I F: [phone]

## 2022-09-05 ENCOUNTER — Ambulatory Visit: Payer: Managed Care, Other (non HMO) | Attending: Physician Assistant | Admitting: Physical Therapy

## 2022-09-05 ENCOUNTER — Encounter: Payer: Self-pay | Admitting: Physical Therapy

## 2022-09-05 ENCOUNTER — Other Ambulatory Visit: Payer: Self-pay

## 2022-09-05 ENCOUNTER — Telehealth: Payer: Self-pay | Admitting: Physical Therapy

## 2022-09-05 VITALS — BP 138/89 | HR 88

## 2022-09-05 DIAGNOSIS — M6281 Muscle weakness (generalized): Secondary | ICD-10-CM | POA: Diagnosis present

## 2022-09-05 DIAGNOSIS — M25552 Pain in left hip: Secondary | ICD-10-CM | POA: Diagnosis present

## 2022-09-05 DIAGNOSIS — R2681 Unsteadiness on feet: Secondary | ICD-10-CM | POA: Insufficient documentation

## 2022-09-05 DIAGNOSIS — R42 Dizziness and giddiness: Secondary | ICD-10-CM | POA: Insufficient documentation

## 2022-09-05 NOTE — Therapy (Signed)
OUTPATIENT PHYSICAL THERAPY VESTIBULAR EVALUATION   Patient Name: Alicia Carr MRN: [id number] DOB:1980/12/12, 42 y.o., female Today's Date: 09/06/2022  END OF SESSION:  PT End of Session - 09/05/22 1624     Visit Number 1    Number of Visits 5   including eval   Date for PT Re-Evaluation 10/18/22    Authorization Type [employer] MANAGED    PT Start Time 1623    PT Stop Time 1712    PT Time Calculation (min) 49 min    Equipment Utilized During Treatment Gait belt    Activity Tolerance Patient tolerated treatment well    Behavior During Therapy WFL for tasks assessed/performed             Past Medical History:  Diagnosis Date   Alcoholism (HCC)    Anemia    Early hepatic fibrosis    Family history of ovarian cancer    Family history of prostate cancer    Gallstones    Genetic testing 11/29/2021   History of chicken pox    Hyperpigmentation    Longitudinal melanonychia    Nevus    right dorsal wrist   Positive ANA (antinuclear antibody)    Primary sclerosing cholangitis    Pruritus    Raynaud's syndrome    Shingles 07/18/2020   Vaginal delivery 2003   Past Surgical History:  Procedure Laterality Date   CESAREAN SECTION  08/08/2006   CHOLECYSTECTOMY  2003   COLONOSCOPY  09/2020   LIVER BIOPSY  05/2020   ROBOTIC ASSISTED SALPINGO OOPHERECTOMY Bilateral 12/03/2021   Procedure: XI ROBOTIC ASSISTED BILATERAL SALPINGECTOMY AND RIGHT OOPHORECTOMY;  Surgeon: Conard Novak, MD;  Location: [facility] ORS;  Service: Gynecology;  Laterality: Bilateral;   TUBAL LIGATION  2023   Tubes were actually removed due to cancer gene mutation   Patient Active Problem List   Diagnosis Date Noted   Right ovarian cyst 12/03/2021   Monoallelic mutation of RAD51C gene 11/30/2021   Genetic testing 11/29/2021   Family history of prostate cancer 11/04/2021   Family history of ovarian cancer 11/04/2021   Intolerant of cold 08/02/2021   Menorrhagia 08/02/2021   Folliculitis 06/10/2021    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 cause 06/18/2020    PCP: Alicia Motto, PA REFERRING PROVIDER: Jarold Motto, PA  REFERRING DIAG: R42 (ICD-10-CM) - Dizziness  THERAPY DIAG:  Dizziness and giddiness - Plan: PT plan of care cert/re-cert  Unsteadiness on feet - Plan: PT plan of care cert/re-cert  ONSET DATE: 08/10/2022 (referral date)  Rationale for Evaluation and Treatment: Rehabilitation  SUBJECTIVE:   SUBJECTIVE STATEMENT: Patient reports that starting in February she started to feel like things were moving. She has a hard time particularly looking at pattered backgrounds. She also feels like she has had some mild tripping/unsteadiness associated with these deficits. Patient reports that she did have shingles a few years ago and had some "weird nerological" things since then but followed up with neurology and they were unable to determine underlying cause. She does have some L tinnitus and whoosing in that ear. She reports that she also never feels like the room spinning. She describes it feels more like things are moving up and down but does not feel as though she is bobbing. She has seen an  eye doctor and they didn't see anything concerning or "nystagmus." Patient also plans to follow up with neuro ophthalmologist but cannot get in until September. Patient prescribed meclizine as needed but has not taken it recently as it seems she is able to do better if she simply avoids looking at things that make her dizzy. She states that meclizine did help her symptoms when she did take it.   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 No  PRECAUTIONS: Fall  WEIGHT BEARING RESTRICTIONS: No  FALLS: Has patient fallen in last 6 months?  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  PATIENT GOALS: "Figure out if this an issue PT can help with and keep it from getting worse."   OBJECTIVE:   DIAGNOSTIC FINDINGS: MR per patient at [facility] which per patient was negative  COGNITION: Overall cognitive status: Within functional limits for tasks assessed (Reports mild word finding difficulty)    SENSATION: Raynaud's like symptoms since shingles, R hand numbness  EDEMA:  Swelling in hand when the weather is hot  POSTURE:  No Significant postural limitations  Cervical ROM:    Active A/PROM (deg) eval  Flexion WFL  Extension WFL  Right lateral flexion WFL  Left lateral flexion WFL  Right rotation WFL  Left rotation WFL  (Blank rows = not tested)  PATIENT SURVEYS:  FOTO 58  VESTIBULAR ASSESSMENT:  GENERAL OBSERVATION: walking without AD no major signs of imbalnace   SYMPTOM BEHAVIOR:  Subjective history: February onset date (see above) Non-Vestibular symptoms: changes in vision, tinnitus, nausea/vomiting, and reports baseline history of visual snow "looks like static to vision" Type of dizziness: Imbalance (Disequilibrium), Oscillopsia, and Unsteady with head/body turns Frequency: daily (but I notice it more when walking or looking at zig zag pattern)  Duration: Goes away if I stop staring as object  Aggravating factors:walking and busy patterns  Relieving factors: Stop looking at things  Progression of symptoms: worse  OCULOMOTOR EXAM:  Ocular  Alignment: normal  Ocular ROM: No Limitations  Spontaneous Nystagmus: absent  Gaze-Induced Nystagmus: absent Smooth Pursuits:  one or two saccades, blinks frequently, reports great difficulty tracking with object movement Saccades:  slight decreased speed, reports stomach feeling a little queezy with increased reps  Convergence/Divergence: < 5 cm (reports mild discomfort)   VBI: negative bilaterally  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 Sitting to supine   2. Supine to L side   3. Supine to R side  4. Supine to sitting   5. L Hallpike-Dix   6. Up from L    7. R Hallpike-Dix   8. Up from R    9. Sitting, head tipped to L knee   10. Head up from L knee   11. Sitting, head tipped to R knee   12. Head up from R knee   13. Sitting head turns x5 1  14.Sitting head nods x5 1  15. In stance, 180 turn to L    16. In stance, 180 turn to R      VESTIBULAR TREATMENT:                                                                                                    Initial Eval only  PATIENT EDUCATION: Education details: POC, examination findings, goals Person educated: Patient Education method: Explanation Education comprehension: verbalized understanding and needs further education  HOME EXERCISE PROGRAM:  GOALS: Goals reviewed with patient? Yes  LONG TERM GOALS: Target date: 10/04/2022  (Short  Term Goals = Long Term Goals given POC length)  Patient will report demonstrate independence with final HEP in order to maintain current gains and continue to progress after physical therapy discharge.   Baseline: To be provided Goal status: INITIAL  2.  Patient will improve FOTO score to 61 to achieve predicted improvements in functional mobility due to skilled physical therapy interventions to increase safety with and participation in daily activities.  Baseline: 58 Goal status: INITIAL  3.  SOT to be assessed/goal written  Baseline: To be assessed Goal status: INITIAL  4.  FGA to be assessed/goal written as indicated Baseline: To be assessed Goal status: INITIAL  ASSESSMENT:  CLINICAL IMPRESSION: Patient is a 42 y.o. female who was seen today for physical therapy evaluation and treatment for dizziness with PMH of stage 1 fibrosis of liver, primary sclerosing colongitis, and shingles. Patient deficits during today's session appear to be largely ocular in nature with greatest deficits in smooth pursuits and saccades as well as presence with esophoria with cover/uncover testing. Patient does also demonstrate greater difficulty with fixation with L head thrust were unique in that they occurred after patient initially fixated on object, rather than a quick saccadic correction typically seen with testing. Patient also reports greatest difficulty with vertical slow VOR indicating possible increased difficulty with gaze stabilization. Unclear at this times if deficits are central versus peripheral in nature. Patient will benefit from further neurophthalmology assessment in future and will benefit from skilled physical therapy services to trial gaze stabilization/ocular exercises to minimize symptoms and maximize function.  OBJECTIVE IMPAIRMENTS: decreased balance, dizziness, and impaired vision/preception.   ACTIVITY LIMITATIONS:  looking at busy backgrounds, turning head quickly  PARTICIPATION  LIMITATIONS:  looking at busy background, considered may impact driving  PERSONAL FACTORS: 3+ comorbidities: see above  are also affecting patient's functional outcome.   REHAB POTENTIAL: Fair given unclear origin of ocular deficits, will benefit from neuro ophthalmologist consult in September  CLINICAL DECISION MAKING: Evolving/moderate complexity  EVALUATION COMPLEXITY: Moderate   PLAN:  PT FREQUENCY: 1x/week  PT  DURATION: 4 weeks  PLANNED INTERVENTIONS: Therapeutic exercises, Therapeutic activity, Neuromuscular re-education, Balance training, Gait training, Patient/Family education, Self Care, Vestibular training, Canalith repositioning, Manual therapy, and Re-evaluation  PLAN FOR NEXT SESSION: SOT/FGA assessment, MSQ, positional, and DVA next session, ask about bowel/bladder function   Carmelia Bake, PT, DPT 09/06/2022, 11:35 AM

## 2022-09-05 NOTE — Telephone Encounter (Signed)
Called patient to ask about moving eval to earlier slot for the day as therapist had early openings. Patient mail box was full and unable to leave message.  Maryruth Eve, PT, DPT

## 2022-09-06 ENCOUNTER — Encounter: Payer: Self-pay | Admitting: Physical Therapy

## 2022-09-06 ENCOUNTER — Encounter: Payer: Managed Care, Other (non HMO) | Admitting: Physical Therapy

## 2022-09-08 ENCOUNTER — Encounter: Payer: Managed Care, Other (non HMO) | Admitting: Physical Therapy

## 2022-09-13 ENCOUNTER — Ambulatory Visit: Payer: Managed Care, Other (non HMO) | Admitting: Physical Therapy

## 2022-09-16 ENCOUNTER — Ambulatory Visit: Payer: Managed Care, Other (non HMO) | Admitting: Physical Therapy

## 2022-09-16 ENCOUNTER — Encounter: Payer: Self-pay | Admitting: Physical Therapy

## 2022-09-16 VITALS — BP 133/89 | HR 77

## 2022-09-16 DIAGNOSIS — R2681 Unsteadiness on feet: Secondary | ICD-10-CM

## 2022-09-16 DIAGNOSIS — M6281 Muscle weakness (generalized): Secondary | ICD-10-CM

## 2022-09-16 DIAGNOSIS — R42 Dizziness and giddiness: Secondary | ICD-10-CM | POA: Diagnosis not present

## 2022-09-16 DIAGNOSIS — M25552 Pain in left hip: Secondary | ICD-10-CM

## 2022-09-16 NOTE — Therapy (Signed)
OUTPATIENT PHYSICAL THERAPY VESTIBULAR TREATMENT   Patient Name: Alicia Carr MRN: [id number] DOB:1980-07-01, 42 y.o., female Today's Date: 09/16/2022  END OF SESSION:  PT End of Session - 09/16/22 1413     Visit Number 2    Number of Visits 5    Date for PT Re-Evaluation 10/18/22    Authorization Type [employer] MANAGED    PT Start Time 1412    PT Stop Time 1505    PT Time Calculation (min) 53 min    Equipment Utilized During Treatment Gait belt    Activity Tolerance Patient tolerated treatment well    Behavior During Therapy WFL for tasks assessed/performed             Past Medical History:  Diagnosis Date   Alcoholism (HCC)    Anemia    Early hepatic fibrosis    Family history of ovarian cancer    Family history of prostate cancer    Gallstones    Genetic testing 11/29/2021   History of chicken pox    Hyperpigmentation    Longitudinal melanonychia    Nevus    right dorsal wrist   Positive ANA (antinuclear antibody)    Primary sclerosing cholangitis    Pruritus    Raynaud's syndrome    Shingles 07/18/2020   Vaginal delivery 2003   Past Surgical History:  Procedure Laterality Date   CESAREAN SECTION  08/08/2006   CHOLECYSTECTOMY  2003   COLONOSCOPY  09/2020   LIVER BIOPSY  05/2020   ROBOTIC ASSISTED SALPINGO OOPHERECTOMY Bilateral 12/03/2021   Procedure: XI ROBOTIC ASSISTED BILATERAL SALPINGECTOMY AND RIGHT OOPHORECTOMY;  Surgeon: Conard Novak, MD;  Location: [facility] ORS;  Service: Gynecology;  Laterality: Bilateral;   TUBAL LIGATION  2023   Tubes were actually removed due to cancer gene mutation   Patient Active Problem List   Diagnosis Date Noted   Right ovarian cyst 12/03/2021   Monoallelic mutation of RAD51C gene 11/30/2021   Genetic testing 11/29/2021   Family history of prostate cancer 11/04/2021   Family history of ovarian cancer 11/04/2021   Intolerant of cold 08/02/2021   Menorrhagia 08/02/2021   Folliculitis 06/10/2021   Longitudinal  melanonychia 06/10/2021   Hyperpigmentation of skin 05/03/2021   Raynaud's syndrome without gangrene 02/04/2021   Positive ANA (antinuclear antibody) 10/06/2020   Arthralgia 10/06/2020   Primary sclerosing cholangitis 09/04/2020   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 cause 06/18/2020    PCP: Jarold Motto, PA REFERRING PROVIDER: Jarold Motto, PA  REFERRING DIAG: R42 (ICD-10-CM) - Dizziness  THERAPY DIAG:  Dizziness and giddiness  Unsteadiness on feet  Pain in left hip  Muscle weakness (generalized)  ONSET DATE: 08/10/2022 (referral date)  Rationale for Evaluation and Treatment: Rehabilitation  SUBJECTIVE:   SUBJECTIVE STATEMENT: Patient reports that she feels like her eyes are more tired later in the day. She reports that she continues to notice it more with patterns. She reports only minor dizziness with some activities. .   Pt accompanied by: self  PERTINENT HISTORY: shingles about 2 years ago, minor liver disease - stage 1 fibrosis, primary sclerosing colongitis, minor blood in urine that they are running test to find out the cause  PAIN:  Are you having pain? No  PRECAUTIONS: Fall  WEIGHT BEARING RESTRICTIONS: No  FALLS: Has patient fallen in last 6 months?  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  PATIENT GOALS: "Figure out if this an issue PT can help with and keep it from getting worse."   OBJECTIVE:   DIAGNOSTIC FINDINGS: MR per patient at [facility] which per patient was negative  COGNITION: Overall cognitive status: Within functional limits for tasks assessed (Reports mild word finding difficulty)    SENSATION: Raynaud's like symptoms since shingles, R hand numbness  EDEMA:   Swelling in hand when the weather is hot  POSTURE:  No Significant postural limitations  Cervical ROM:    Active A/PROM (deg) eval  Flexion WFL  Extension WFL  Right lateral flexion WFL  Left lateral flexion WFL  Right rotation WFL  Left rotation WFL  (Blank rows = not tested)  PATIENT SURVEYS:  FOTO 58  VESTIBULAR ASSESSMENT:  POSITIONAL TESTING: L Dix Hallpike, R Dix Eleele, R Roll Test, L Roll Test: no nystagmus, all negative  MOTION SENSITIVITY:  Motion Sensitivity Quotient Intensity: 0 = none, 1 = Lightheaded, 2 = Mild, 3 = Moderate, 4 = Severe, 5 = Vomiting  Intensity  1. Sitting to supine 0  2. Supine to L side 0  3. Supine to R side 1  4. Supine to sitting 1  5. L Hallpike-Dix 1  6. Up from L  1  7. R Hallpike-Dix 1  8. Up from R  1  9. Sitting, head tipped to L knee 2  10. Head up from L knee 2  11. Sitting, head tipped to R knee 2  12. Head up from R knee 2  13. Sitting head turns x5 1  14.Sitting head nods x5 1  15. In stance, 180 turn to L  2  16. In stance, 180 turn to R 3   DVA:   Static Line 10  Dynamic: Line 5  Difference: 5 Line - Outside of Normal Limitis  VESTIBULAR TREATMENT:                                                                                                    Finished vestibular assessment as noted above   [facility] PT Assessment - 09/16/22 0001       Functional Gait  Assessment   Gait assessed  Yes    Gait Level Surface Walks 20 ft in less than 5.5 sec, no assistive devices, good speed, no evidence for imbalance, normal gait pattern, deviates no more than 6 in outside of the 12 in walkway width.    Change in Gait Speed Able to smoothly change walking speed without loss of balance or gait deviation. Deviate no more than 6 in outside of the 12 in walkway width.    Gait with Horizontal Head Turns Performs head turns smoothly with slight change in gait velocity (eg, minor disruption to smooth gait path), deviates 6-10 in  outside 12 in walkway width, or uses an assistive device.   reports feeling very dizzy/off   Gait with Vertical Head Turns Performs task with slight  change in gait velocity (eg, minor disruption to smooth gait path), deviates 6 - 10 in outside 12 in walkway width or uses assistive device   feels mildly easier than horizontal   Gait and Pivot Turn Pivot turns safely within 3 sec and stops quickly with no loss of balance.    Step Over Obstacle Is able to step over 2 stacked shoe boxes taped together (9 in total height) without changing gait speed. No evidence of imbalance.    Gait with Narrow Base of Support Is able to ambulate for 10 steps heel to toe with no staggering.    Gait with Eyes Closed Walks 20 ft, no assistive devices, good speed, no evidence of imbalance, normal gait pattern, deviates no more than 6 in outside 12 in walkway width. Ambulates 20 ft in less than 7 sec.    Ambulating Backwards Walks 20 ft, no assistive devices, good speed, no evidence for imbalance, normal gait    Steps Alternating feet, no rail.    Total Score 28    FGA comment: 28/30 = above a falls risk             VOR x 1 gaze stabilization 2 x 30 seconds with horizontal/vertical head movements (slow speed)   PATIENT EDUCATION: Education details: POC, examination findings, goal collaboration  Person educated: Patient Education method: Explanation Education comprehension: verbalized understanding and needs further education  HOME EXERCISE PROGRAM: VOR x 1 gaze stabilization 6 x 30 seconds with horizontal/vertical head movements (slow speed) daily  GOALS: Goals reviewed with patient? Yes  LONG TERM GOALS: Target date: 10/04/2022  (Short Term Goals = Long Term Goals given POC length)  Patient will report demonstrate independence with final HEP in order to maintain current gains and continue to progress after physical therapy discharge.   Baseline: To be provided Goal status: INITIAL  2.  Patient will improve  FOTO score to 61 to achieve predicted improvements in functional mobility due to skilled physical therapy interventions to increase safety with and participation in daily activities.  Baseline: 58 Goal status: INITIAL  3.  SOT to be assessed/goal written  Baseline: To be assessed Goal status: INITIAL  4.  Patient will improve score on FGA horizontal/vertical head turns to 3/3 to indicate improved integration of oculomotor/vestibular system.  Baseline: 2/3 Goal status: INITIAL  ASSESSMENT:  CLINICAL IMPRESSION: Session emphasized further assessment of vestibular functional testing. Patient demonstrates moderate levels of motion sensitivity as indicated by MSQ results as dynamic head turns exacerbate challenges. Testing does appear to have significant oculomotor/vestibular component as patient's difference on DVA was 5 line difference. While patient is above falls risk on FGA, patient does demonstrate increased difficult with vertical / horizontal head turns. Patient introduced to VOR and demonstrates moderate apprehension with exercise. Reports increase ease with vertical versus horizontal. Patient will benefit from continued physical therapy services to address deficits. Continue POC.   OBJECTIVE IMPAIRMENTS: decreased balance, dizziness, and impaired vision/preception.   ACTIVITY LIMITATIONS:  looking at busy backgrounds, turning head quickly  PARTICIPATION LIMITATIONS:  looking at busy background, considered may impact driving  PERSONAL FACTORS: 3+ comorbidities: see above  are also affecting patient's functional outcome.   REHAB POTENTIAL: Fair given unclear origin of ocular deficits, will benefit from neuro ophthalmologist consult in September  CLINICAL DECISION MAKING: Evolving/moderate complexity  EVALUATION COMPLEXITY: Moderate   PLAN:  PT FREQUENCY: 1x/week  PT DURATION: 4 weeks  PLANNED INTERVENTIONS: Therapeutic exercises, Therapeutic activity, Neuromuscular  re-education, Balance training, Gait training,  Patient/Family education, Self Care, Vestibular training, Canalith repositioning, Manual therapy, and Re-evaluation  PLAN FOR NEXT SESSION: SOT assessment, work on VOR x 1 viewing progression, occulomotor exercises   Carmelia Bake, PT, DPT 09/16/2022, 3:19 PM

## 2022-09-20 ENCOUNTER — Encounter: Payer: Self-pay | Admitting: Physical Therapy

## 2022-09-20 ENCOUNTER — Ambulatory Visit: Payer: Managed Care, Other (non HMO) | Admitting: Physical Therapy

## 2022-09-20 VITALS — BP 120/75 | HR 92

## 2022-09-20 DIAGNOSIS — R42 Dizziness and giddiness: Secondary | ICD-10-CM | POA: Diagnosis not present

## 2022-09-20 DIAGNOSIS — R2681 Unsteadiness on feet: Secondary | ICD-10-CM

## 2022-09-20 NOTE — Therapy (Signed)
OUTPATIENT PHYSICAL THERAPY VESTIBULAR TREATMENT   Patient Name: Alicia Carr MRN: [id number] DOB:09/01/1980, 42 y.o., female Today's Date: 09/20/2022  END OF SESSION:  PT End of Session - 09/20/22 1624     Visit Number 3    Number of Visits 5    Date for PT Re-Evaluation 10/18/22    Authorization Type [employer] MANAGED    PT Start Time 1622    PT Stop Time 1700    PT Time Calculation (min) 38 min    Equipment Utilized During Treatment Gait belt    Activity Tolerance Patient tolerated treatment well    Behavior During Therapy WFL for tasks assessed/performed             Past Medical History:  Diagnosis Date   Alcoholism (HCC)    Anemia    Early hepatic fibrosis    Family history of ovarian cancer    Family history of prostate cancer    Gallstones    Genetic testing 11/29/2021   History of chicken pox    Hyperpigmentation    Longitudinal melanonychia    Nevus    right dorsal wrist   Positive ANA (antinuclear antibody)    Primary sclerosing cholangitis    Pruritus    Raynaud's syndrome    Shingles 07/18/2020   Vaginal delivery 2003   Past Surgical History:  Procedure Laterality Date   CESAREAN SECTION  08/08/2006   CHOLECYSTECTOMY  2003   COLONOSCOPY  09/2020   LIVER BIOPSY  05/2020   ROBOTIC ASSISTED SALPINGO OOPHERECTOMY Bilateral 12/03/2021   Procedure: XI ROBOTIC ASSISTED BILATERAL SALPINGECTOMY AND RIGHT OOPHORECTOMY;  Surgeon: Conard Novak, MD;  Location: [facility] ORS;  Service: Gynecology;  Laterality: Bilateral;   TUBAL LIGATION  2023   Tubes were actually removed due to cancer gene mutation   Patient Active Problem List   Diagnosis Date Noted   Right ovarian cyst 12/03/2021   Monoallelic mutation of RAD51C gene 11/30/2021   Genetic testing 11/29/2021   Family history of prostate cancer 11/04/2021   Family history of ovarian cancer 11/04/2021   Intolerant of cold 08/02/2021   Menorrhagia 08/02/2021   Folliculitis 06/10/2021   Longitudinal  melanonychia 06/10/2021   Hyperpigmentation of skin 05/03/2021   Raynaud's syndrome without gangrene 02/04/2021   Positive ANA (antinuclear antibody) 10/06/2020   Arthralgia 10/06/2020   Primary sclerosing cholangitis 09/04/2020   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 cause 06/18/2020    PCP: Jarold Motto, PA REFERRING PROVIDER: Jarold Motto, PA  REFERRING DIAG: R42 (ICD-10-CM) - Dizziness  THERAPY DIAG:  Dizziness and giddiness  Unsteadiness on feet  ONSET DATE: 08/10/2022 (referral date)  Rationale for Evaluation and Treatment: Rehabilitation  SUBJECTIVE:   SUBJECTIVE STATEMENT: Patient reports that she is stumbling on the L foot occasionally (1x since last here). Patient reports that she has done her exercises 1x after work. She reports taking the meclizine to feel better. Patient reports symptoms were the worst when doing her exercises about a 2-3/10.   Pt accompanied by: self  PERTINENT HISTORY: shingles about 2 years ago, minor liver disease - stage 1 fibrosis, primary sclerosing colongitis, minor blood in urine that they are running test to find out the cause  PAIN:  Are you having pain? No  PRECAUTIONS: Fall  WEIGHT BEARING RESTRICTIONS: No  FALLS: Has patient fallen in last 6 months?  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  PATIENT GOALS: "Figure out if this an issue PT can help with and keep it from getting worse."   OBJECTIVE:   DIAGNOSTIC FINDINGS: MR per patient at [facility] which per patient was negative  COGNITION: Overall cognitive status: Within functional limits for tasks assessed (Reports mild word finding difficulty)   PATIENT SURVEYS:  FOTO 58  VESTIBULAR TREATMENT:                                                                                                     Vitals:   09/20/22 1630  BP: 120/75  Pulse: 92   Sensory Organization Test Component 1: 3/3 passed  Component 2: 3/3 passed Component 3: 3/3 passed Component 4: 3/3 passed Component 5: 1/3 passed, 2 falls Component 6: 2/3 passed, 1 falls  Visual: Above age matched Vestibular: Below age matched Somatosensory: Above age matched norms Composite: 5 (Below Age Matched Norms)  Patient reports only mild levels of dizziness with the activities.   VESTIBULAR TREATMENT:  Gaze Adaptation: x1 Viewing Horizontal: Position: standing plain background, Time: 30 second, Reps: 2, and Comment: reports no change from rep to rep symptoms 2/10 and x1 Viewing Vertical:  Position: standing plain background, Time: 30 seconds, Reps: 2, and Comment: about the same going up and down as side to side  PATIENT EDUCATION: Education details: Continue HEP Person educated: Patient Education method: Explanation Education comprehension: verbalized understanding and needs further education  HOME EXERCISE PROGRAM: VOR x 1 gaze stabilization 6 x 30 seconds with horizontal/vertical head movements (slow speed) daily  GOALS: Goals reviewed with patient? Yes  LONG TERM GOALS: Target date: 10/04/2022  (Short Term Goals = Long Term Goals given POC length)  Patient will report demonstrate independence with final HEP in order to maintain current gains and continue to progress after physical therapy discharge.   Baseline: To be provided Goal status: INITIAL  2.  Patient will improve FOTO score to 61 to achieve predicted improvements in functional mobility due to skilled physical therapy interventions to increase safety with and participation in daily activities.  Baseline: 58 Goal status: INITIAL  3.  Patient will improve SOT goal to age matched norms to demonstrate improved integration of visual, vestibular, and  somatosensory balance systems in order to return to PLOF.   Baseline: Below age matched norms 3 Goal status: INITIAL  4.  Patient will improve score on FGA horizontal/vertical head turns to 3/3 to indicate improved integration of oculomotor/vestibular system.  Baseline: 2/3 Goal status: INITIAL  ASSESSMENT:  CLINICAL IMPRESSION: Session emphasized assessment of SOT and progression of VOR x 1 viewing at end of session. Patient scores below age matched norms on vestibular challenges and will benefit from work in this area. Continues to demonstrate increased sensitivity to head turns with pattered backgrounds. Continue POC.   OBJECTIVE IMPAIRMENTS: decreased balance, dizziness, and impaired vision/preception.   ACTIVITY LIMITATIONS:  looking at busy backgrounds, turning head quickly  PARTICIPATION LIMITATIONS:  looking at busy background, considered may impact driving  PERSONAL FACTORS: 3+ comorbidities: see above  are also affecting patient's functional outcome.   REHAB POTENTIAL: Fair given unclear origin of ocular deficits, will benefit from neuro ophthalmologist consult in September  CLINICAL DECISION MAKING: Evolving/moderate complexity  EVALUATION COMPLEXITY: Moderate   PLAN:  PT FREQUENCY: 1x/week  PT DURATION: 4 weeks  PLANNED INTERVENTIONS: Therapeutic exercises, Therapeutic activity, Neuromuscular re-education, Balance training, Gait training, Patient/Family education, Self Care, Vestibular training, Canalith repositioning, Manual therapy, and Re-evaluation  PLAN FOR NEXT SESSION: work on VOR x 1 viewing progression, occulomotor exercises, busy background tasks   Carmelia Bake, PT, DPT 09/20/2022, 5:19 PM

## 2022-09-27 ENCOUNTER — Ambulatory Visit: Payer: Managed Care, Other (non HMO) | Admitting: Physical Therapy

## 2022-10-04 ENCOUNTER — Telehealth: Payer: Self-pay | Admitting: Physical Therapy

## 2022-10-04 ENCOUNTER — Ambulatory Visit: Payer: Managed Care, Other (non HMO) | Attending: Physician Assistant | Admitting: Physical Therapy

## 2022-10-04 NOTE — Telephone Encounter (Signed)
Attempted to call patient and leave VM but [facility] was full and unable to leave message; patient has no other remaining PT visits and canceled/no showed last visit. Will require call back to get back on schedule.   Maryruth Eve, PT, DPT

## 2022-10-06 NOTE — Telephone Encounter (Signed)
Entered on error.

## 2022-10-17 ENCOUNTER — Encounter: Payer: Self-pay | Admitting: Physician Assistant

## 2022-10-17 ENCOUNTER — Encounter: Payer: Self-pay | Admitting: Internal Medicine

## 2022-10-17 ENCOUNTER — Telehealth: Payer: Self-pay | Admitting: Physician Assistant

## 2022-10-17 DIAGNOSIS — Z1231 Encounter for screening mammogram for malignant neoplasm of breast: Secondary | ICD-10-CM

## 2022-10-17 HISTORY — PX: CYSTOSCOPY: SHX5120

## 2022-10-17 NOTE — Telephone Encounter (Signed)
Patient states she is due for preventative mammogram in September but her CPE is in October of this year. Can preventative mammo order be placed so she can have it in September? Please Advise.

## 2022-10-17 NOTE — Telephone Encounter (Signed)
Left detailed message on personal voicemail you do not need an order for Screening Mammogram you can just call and schedule. Any questions please call the office.

## 2022-10-19 ENCOUNTER — Encounter: Payer: Self-pay | Admitting: *Deleted

## 2022-11-05 IMAGING — MR MR PELVIS WO/W CM
19 series · 46 of 48 positions shown · IV contrast (6ml GADAVIST)
Comparison: Pelvic ultrasound, 03/18/2021
COMPARISON: Pelvic ultrasound, 03/18/2021

Addendum:
CLINICAL DATA: Right ovarian mass incidentally identified by prior
ultrasound

EXAM:
MRI PELVIS WITHOUT AND WITH CONTRAST
TECHNIQUE: Multiplanar multisequence MR imaging of the pelvis was performed
both before and after administration of intravenous contrast.
CONTRAST:  6mL GADAVIST GADOBUTROL 1 MMOL/ML IV SOLN

[Series 2: T2 · coronal · 6.0mm · 1.56mm/px · 1 of 30 slices shown (1 of 4)]
[im 1/30]
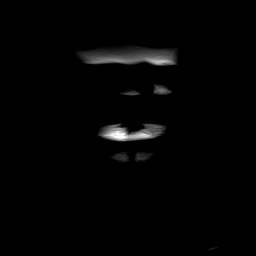

[Series 3: T2 · axial · 5.0mm · 0.51mm/px · 1 of 34 slices shown (2 of 4)]
[im 1/34]
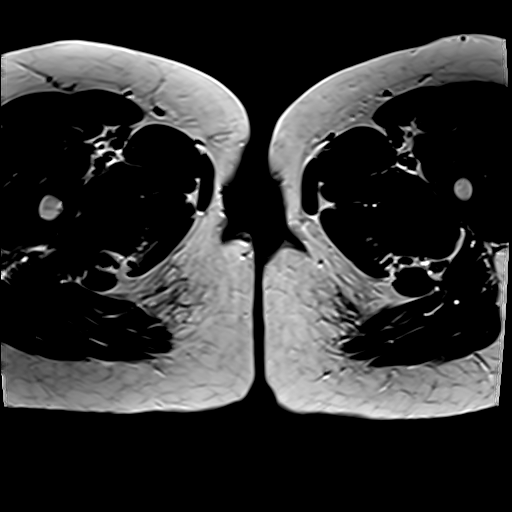

[Series 4: T2 · coronal · 4.0mm · 0.51mm/px · 2 of 34 slices shown (3 of 4)]
[im 1/34]
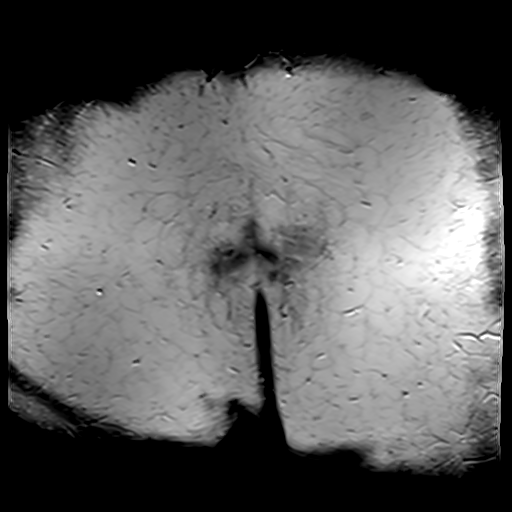
[im 34/34]
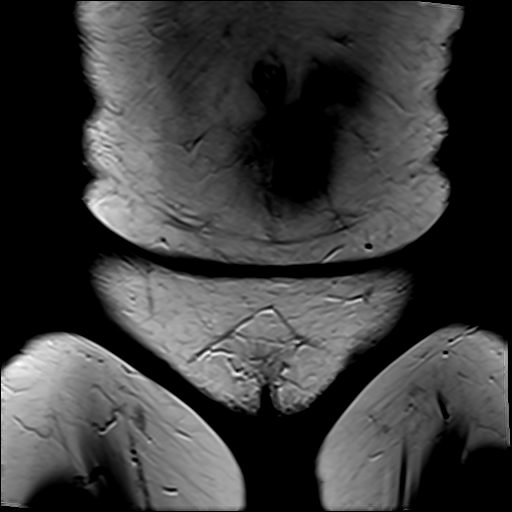

[Series 5: T2 fat-sat · axial · 5.0mm · 0.51mm/px · z∈[-91,+107]mm · 2 of 34 slices shown]
[im 1/34]
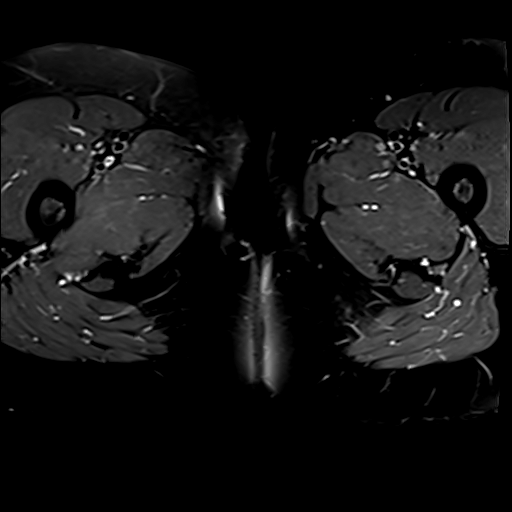
[im 34/34]
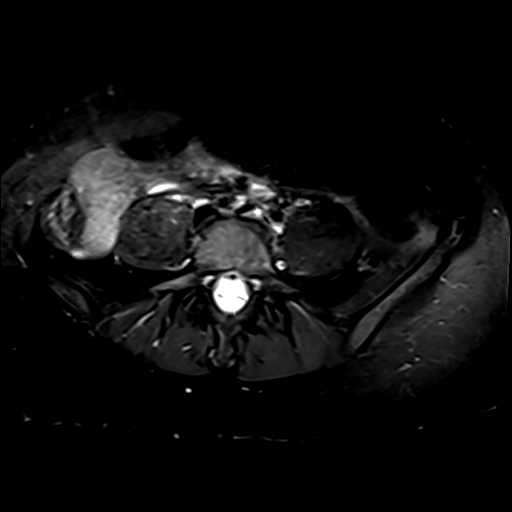

[Series 6: T2 · sagittal · 5.0mm · 0.55mm/px · 1 of 26 slices shown (4 of 4)]
[im 1/26]
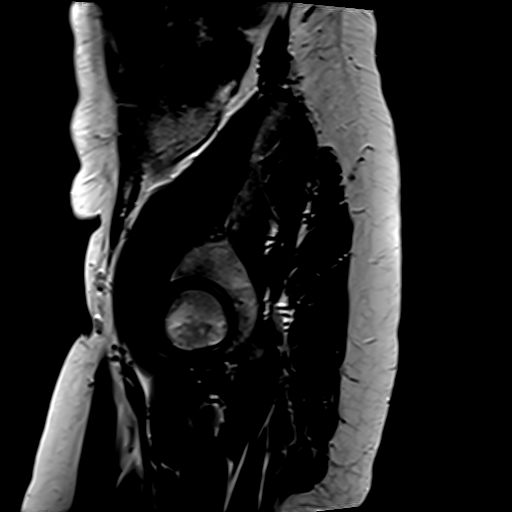

[Series 7: T1 · axial · 4.0mm · 0.84mm/px · z∈[-98,+138]mm · 3 of 60 slices shown (1 of 2)]
[im 1/60]
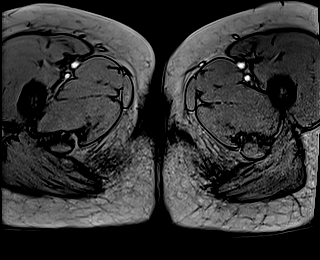
[im 30/60]
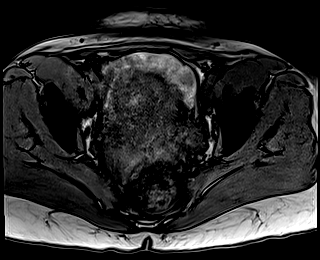
[im 60/60]
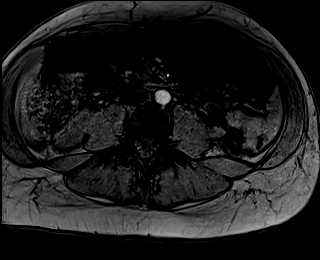

[Series 8: T1 · axial · 4.0mm · 0.84mm/px · z∈[-98,+138]mm · 3 of 60 slices shown (2 of 2)]
[im 1/60]
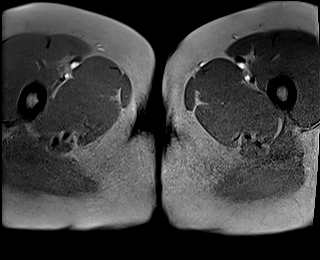
[im 30/60]
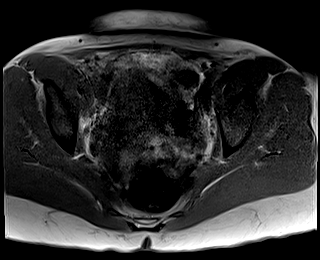
[im 60/60]
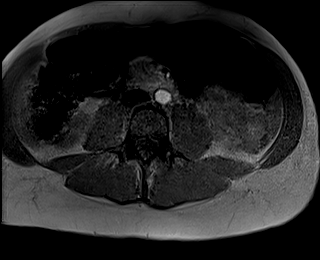

[Series 9: DWI · axial · 5.1mm · 2.80mm/px · z∈[-73,+85]mm · 5 of 95 slices shown (1 of 3)]
[im 1/95]
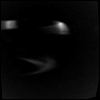
[im 24/95]
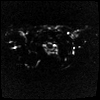
[im 48/95]
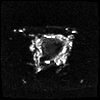
[im 71/95]
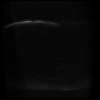
[im 95/95]
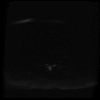

[Series 10: DWI · axial · 5.1mm · 2.80mm/px · z∈[-73,+85]mm · 2 of 32 slices shown (2 of 3)]
[im 1/32]
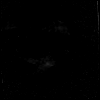
[im 32/32]
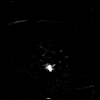

[Series 11: DWI · axial · 5.1mm · 2.80mm/px · z∈[-73,+85]mm · 2 of 31 slices shown (3 of 3)]
[im 1/31]
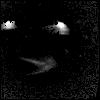
[im 31/31]
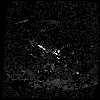

[Series 13: T1 dynamic · axial · 3.0mm · 0.84mm/px · z∈[-81,+108]mm · 3 of 64 slices shown (1 of 6)]
[im 1/64]
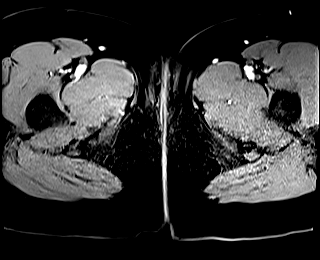
[im 32/64]
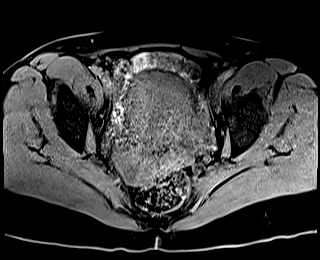
[im 64/64]
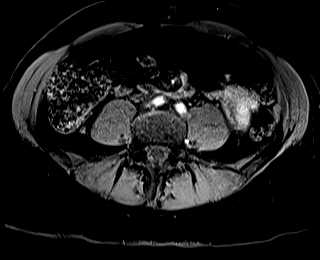

[Series 15: T1 dynamic · axial · 3.0mm · 0.84mm/px · z∈[-81,+108]mm · 3 of 64 slices shown (2 of 6)]
[im 1/64]
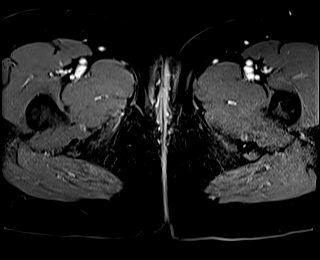
[im 32/64]
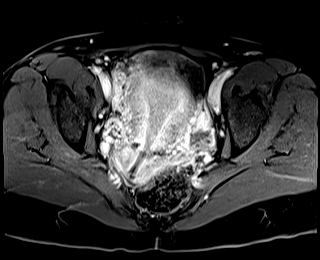
[im 64/64]
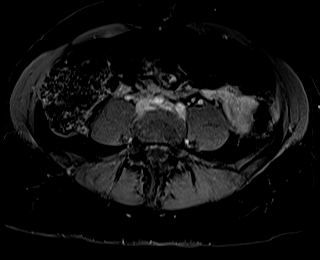

[Series 18: T1 dynamic · axial · 3.0mm · 0.84mm/px · z∈[-81,+108]mm · 3 of 64 slices shown (3 of 6)]
[im 1/64]
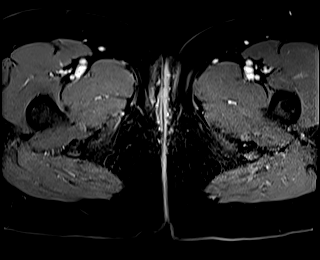
[im 32/64]
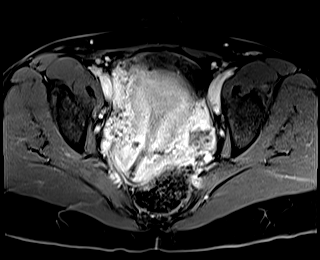
[im 64/64]
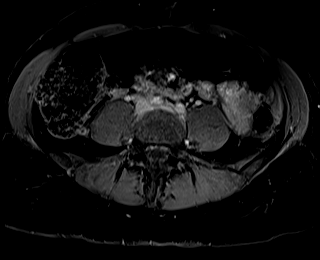

[Series 19: T1 dynamic · axial · 3.0mm · 0.84mm/px · z∈[-81,+108]mm · 3 of 64 slices shown (4 of 6)]
[im 1/64]
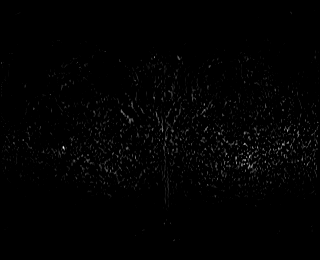
[im 32/64]
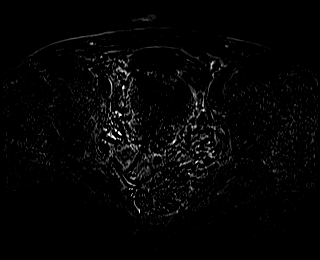
[im 64/64]
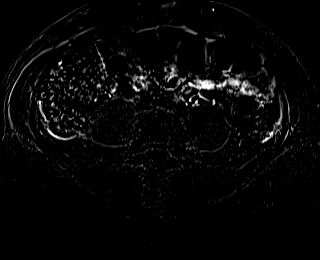

[Series 22: T1 dynamic · axial · 3.0mm · 0.84mm/px · z∈[-81,+108]mm · 3 of 64 slices shown (5 of 6)]
[im 1/64]
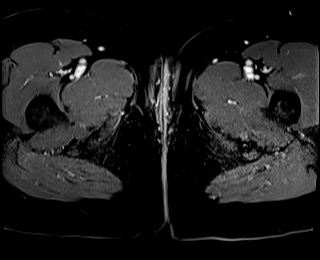
[im 32/64]
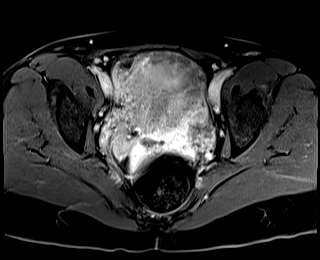
[im 64/64]
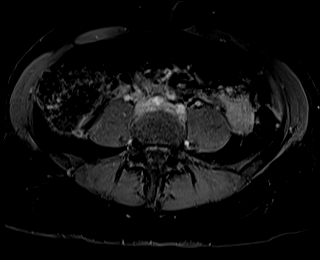

[Series 23: T1 dynamic · axial · 3.0mm · 0.84mm/px · z∈[-81,+108]mm · 3 of 64 slices shown (6 of 6)]
[im 1/64]
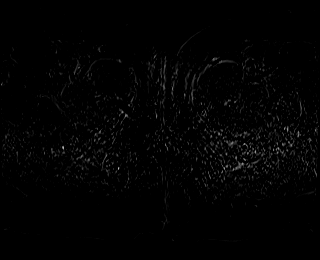
[im 32/64]
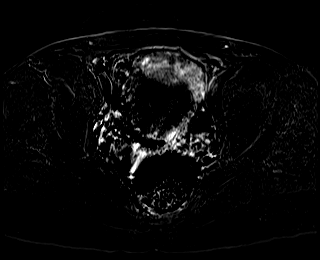
[im 64/64]
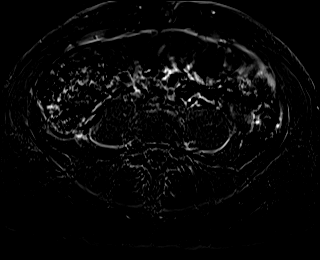

[Series 25: T1 dynamic post-contrast · sagittal · 3.0mm · 0.88mm/px · 4 of 80 slices shown (1 of 2)]
[im 1/80]
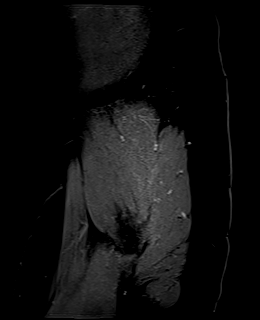
[im 27/80]
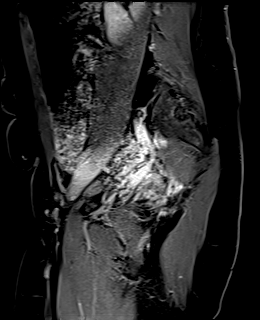
[im 53/80]
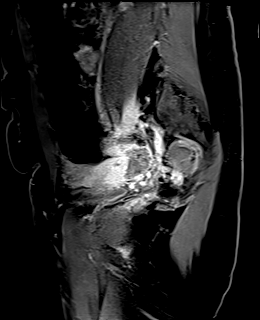
[im 80/80]
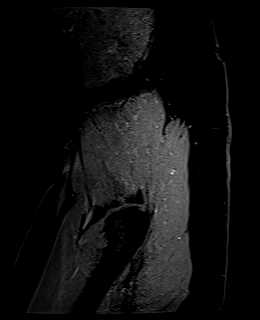

[Series 26: T2 post-contrast · sagittal · 5.0mm · 0.55mm/px · 1 of 26 slices shown]
[im 1/26]
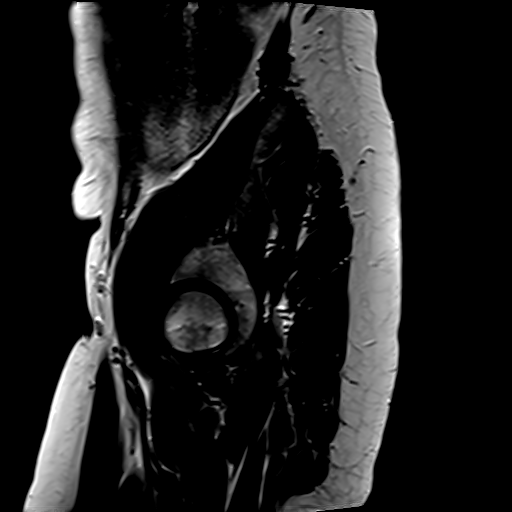

[Series 28: T1 dynamic post-contrast · axial · 3.0mm · 1.06mm/px · 1 of 64 slices shown (2 of 2)]
[im 1/64]
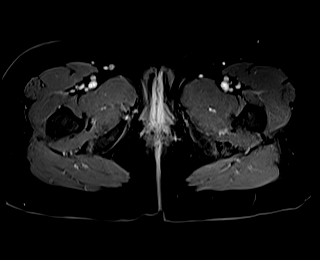

[46 of 48 positions shown; findings below may reference images not displayed]

FINDINGS: Urinary Tract:  No abnormality visualized.

Bowel:  Unremarkable visualized pelvic bowel loops.

Vascular/Lymphatic: No pathologically enlarged lymph nodes. No
significant vascular abnormality seen.

Reproductive: There is a macroscopic fat containing dermoid of the
right ovary measuring 1.5 x 1.4 cm (series 7, image 35). The ovaries
are otherwise normal with numerous small functional follicles.
Nabothian cysts of the cervix.

Other:  None.

Musculoskeletal: No suspicious bone lesions identified.
IMPRESSION: 1. There is a 1.5 x 1.4 cm macroscopic fat containing dermoid of the
right ovary, in keeping with findings of prior ultrasound. No
further follow-up or characterization is required for this benign
lesion.
2. The ovaries are otherwise normal with numerous small functional
follicles.

ADDENDUM:
The uterus is at the upper limit of normal in size, perhaps with
some T2 hyperintensity of the myometrium immediately underlying the
endometrium. This appearance generally suggests, but is not
definitive for mild adenomyosis. There is no abnormal contrast
enhancement or evidence of suspicious mass.

*** End of Addendum ***
FINDINGS: Urinary Tract:  No abnormality visualized.

Bowel:  Unremarkable visualized pelvic bowel loops.

Vascular/Lymphatic: No pathologically enlarged lymph nodes. No
significant vascular abnormality seen.

Reproductive: There is a macroscopic fat containing dermoid of the
right ovary measuring 1.5 x 1.4 cm (series 7, image 35). The ovaries
are otherwise normal with numerous small functional follicles.
Nabothian cysts of the cervix.

Other:  None.

Musculoskeletal: No suspicious bone lesions identified.
IMPRESSION: 1. There is a 1.5 x 1.4 cm macroscopic fat containing dermoid of the
right ovary, in keeping with findings of prior ultrasound. No
further follow-up or characterization is required for this benign
lesion.
2. The ovaries are otherwise normal with numerous small functional
follicles.

## 2022-11-18 ENCOUNTER — Inpatient Hospital Stay: Payer: Managed Care, Other (non HMO) | Attending: Hematology and Oncology | Admitting: Hematology and Oncology

## 2022-11-18 VITALS — BP 132/83 | HR 94 | Temp 98.1°F | Resp 17 | Wt 165.6 lb

## 2022-11-18 DIAGNOSIS — Z9189 Other specified personal risk factors, not elsewhere classified: Secondary | ICD-10-CM | POA: Diagnosis not present

## 2022-11-18 DIAGNOSIS — Z1589 Genetic susceptibility to other disease: Secondary | ICD-10-CM

## 2022-11-18 DIAGNOSIS — Z1501 Genetic susceptibility to malignant neoplasm of breast: Secondary | ICD-10-CM | POA: Insufficient documentation

## 2022-11-18 DIAGNOSIS — Z1509 Genetic susceptibility to other malignant neoplasm: Secondary | ICD-10-CM | POA: Insufficient documentation

## 2022-11-18 DIAGNOSIS — Z1502 Genetic susceptibility to malignant neoplasm of ovary: Secondary | ICD-10-CM | POA: Diagnosis not present

## 2022-11-18 NOTE — Progress Notes (Signed)
[facility] CONSULT NOTE  Patient Care Team: Jarold Motto, [place] as PCP - General (Physician Assistant)  CHIEF COMPLAINTS/PURPOSE OF CONSULTATION:  High risk for [facility]  ASSESSMENT & PLAN:   Assessment and Plan    Abdominal Pain Persistent left-sided abdominal pressure, worse at certain times of the month. Previous imaging studies unremarkable according to the patient. Possible correlation with menstrual cycle and bowel transit time.  Hyperpigmentation Generalized hyperpigmentation, including on the lips and abdomen. Previous testing for adrenal causes was negative. -Appointment scheduled with a university dermatologist in October for further evaluation.  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 Recent MRI of the breast was unremarkable. No concerns on exam today. -Continue intensive screening with mammograms in the fall and MRIs in the spring.  Liver Disease Stage 1 liver disease, currently asymptomatic according to hepatologist. -Continue monitoring with hepatologist as recommended.  Menstrual Irregularities Shorter menstrual cycles, possibly related to stress or weight gain. -Continue monitoring cycles and report any significant changes to primary care provider or gynecologist.  General Health Maintenance -Order mammogram for this month or early next month. -Order MRI for spring. -Referral to gynecologic oncologist for ovarian cancer risk evaluation.    HISTORY OF PRESENTING ILLNESS:  Alicia Carr 42 y.o. 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  This is a very pleasant 42 year old female patient here for high risk breast cancer follow up.  Discussed the use of AI scribe software for clinical note transcription with the patient, who gave verbal consent to proceed.  History of Present Illness    The patient, with a history of liver disease, presents with three main concerns. The first is a persistent pressure sensation on the left side of her  abdomen. Despite multiple investigations including ultrasound and CT scans, the cause remains undetermined. The patient reports that the discomfort seems to worsen around the time of her menstrual cycle, leading her to question if it could be related to cycles. The patient also wonders if the discomfort could be due to constipation, as CT scans have previously shown stool throughout her colon.  The second concern is hyperpigmentation, which has been progressively worsening. The patient reports darkening of the skin in various areas, including the lips and stomach. Despite hormonal testing, the cause of this change remains unknown. The patient also mentions the presence of thickened skin, diagnosed as acanthosis nigricans by a dermatologist.  The third concern is an irregular menstrual cycle, which has become shorter in recent months. The patient is unsure if this could be a sign of perimenopause.  In addition to these concerns, the patient mentions a history of blood in her urine, for which no cause was found. She also reports changes in her nails, which a dermatologist suggested could be related to liver or kidney issues. However, repeated testing of liver and kidney function has been normal. The patient has been diagnosed with a liver disease, but it is currently at Stage 1 and is not believed to be causing any symptoms.   She had an MRI for breast ca screening, no concerns. She denies any change in her breasts. Rest of the pertinent 10 point ROS reviewed and neg.  MEDICAL HISTORY:  Past Medical History:  Diagnosis Date   Alcoholism (HCC)    Anemia    Early hepatic fibrosis    Family history of ovarian cancer    Family history of prostate cancer    Gallstones    Genetic testing 11/29/2021   History of chicken pox  Hyperpigmentation    Longitudinal melanonychia    Nevus    right dorsal wrist   Positive ANA (antinuclear antibody)    Primary sclerosing cholangitis    Pruritus     Raynaud's syndrome    Shingles 07/18/2020   Vaginal delivery 2003    SURGICAL HISTORY: Past Surgical History:  Procedure Laterality Date   CESAREAN SECTION  08/08/2006   CHOLECYSTECTOMY  2003   COLONOSCOPY  09/2020   CYSTOSCOPY  10/17/2022   [facility], Microscopic Hematuria, Repeat Micro UA in 1  year.   LIVER BIOPSY  05/2020   ROBOTIC ASSISTED SALPINGO OOPHERECTOMY Bilateral 12/03/2021   Procedure: XI ROBOTIC ASSISTED BILATERAL SALPINGECTOMY AND RIGHT OOPHORECTOMY;  Surgeon: Conard Novak, MD;  Location: [facility] ORS;  Service: Gynecology;  Laterality: Bilateral;   TUBAL LIGATION  2023   Tubes were actually removed due to cancer gene mutation    SOCIAL HISTORY: Social History   Socioeconomic History   Marital status: Married    Spouse name: Weston Brass   Number of children: 2   Years of education: Not on file   Highest education level: Bachelor's degree (e.g., BA, AB, BS)  Occupational History   Occupation: Systems developer  Tobacco Use   Smoking status: Never    Passive exposure: Past   Smokeless tobacco: Never  Vaping Use   Vaping status: Never Used  Substance and Sexual Activity   Alcohol use: Yes    Alcohol/week: 3.0 standard drinks of alcohol    Types: 3 Glasses of wine per week   Drug use: No   Sexual activity: Yes    Birth control/protection: None    Comment: My husband cannot have children  Other Topics Concern   Not on file  Social History Narrative   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, retail for [employer]   1 son born 2003 1 daughter born 2008   Alcohol use regular for 10+ years stopped 01/18/2020 was 2 to 3 glasses of wine daily   No drug use   Coffee and tea 2 or 3 a day for caffeine   Social Determinants of Corporate investment banker Strain: Not on file  Food Insecurity: Not on file  Transportation Needs: Not on file  Physical Activity: Not on file  Stress: Not on file  Social Connections: Not on file  Intimate  Partner Violence: Not on file    FAMILY HISTORY: Family History  Problem Relation Age of Onset   Mental illness Mother        paranoid schizophrenia   Hyperlipidemia Mother    Alcohol abuse Father    Colon polyps Father    Hypertension Father    Prostate cancer Father 54   Healthy Daughter    Asthma Daughter    Learning disabilities Daughter    ADD / ADHD Daughter    Anxiety disorder Daughter    Diabetes Paternal Aunt    Cancer Maternal Grandmother        uncertain type   Heart murmur Brother    Healthy Son    Ovarian cancer Other        PGF sister   Breast cancer Neg Hx     ALLERGIES:  has No Known Allergies.  MEDICATIONS:  Current Outpatient Medications  Medication Sig Dispense Refill   hydrocortisone (ANUSOL-HC) 2.5 % rectal cream Place 1 Application rectally 2 (two) times daily. For 1 week then prn 30 g 1   loteprednol (LOTEMAX) 0.5 % ophthalmic  suspension Place 1 drop into the left eye. Three times a week     meclizine (ANTIVERT) 25 MG tablet Take 25 mg by mouth as needed for dizziness.     No current facility-administered medications for this visit.     PHYSICAL EXAMINATION: ECOG PERFORMANCE STATUS: 0 - Asymptomatic  Vitals:   11/18/22 1137  BP: 132/83  Pulse: 94  Resp: 17  Temp: 98.1 F (36.7 C)  SpO2: 100%   Filed Weights   11/18/22 1137  Weight: 165 lb 9.6 oz (75.1 kg)    Physical Exam Constitutional:      Appearance: Normal appearance.  Chest:     Comments: Bilateral breasts inspected.  No palpable masses or regional adenopathy. Musculoskeletal:     Cervical back: Normal range of motion and neck supple. No rigidity.  Lymphadenopathy:     Cervical: No cervical adenopathy.  Neurological:     Mental Status: She is alert.      LABORATORY DATA:  I have reviewed the data as listed Lab Results  Component Value Date   WBC 7.8 02/14/2022   HGB 11.9 (L) 02/14/2022   HCT 38.1 02/14/2022   MCV 82.8 02/14/2022   PLT 270 02/14/2022      Chemistry      Component Value Date/Time   NA 137 02/14/2022 2122   NA 144 08/24/2016 0900   K 4.4 02/14/2022 2122   CL 104 02/14/2022 2122   CO2 24 02/14/2022 2122   BUN 13 02/14/2022 2122   BUN 17 08/24/2016 0900   CREATININE 0.88 02/14/2022 2122   CREATININE 0.82 12/02/2019 0823      Component Value Date/Time   CALCIUM 9.0 02/14/2022 2122   ALKPHOS 64 02/14/2022 2122   AST 24 02/14/2022 2122   ALT 16 02/14/2022 2122   BILITOT 0.6 02/14/2022 2122   BILITOT <0.2 12/21/2016 1222       RADIOGRAPHIC STUDIES: I have personally reviewed the radiological images as listed and agreed with the findings in the report. No results found.  All questions were answered. The patient knows to call the clinic with any problems, questions or concerns. I spent 30 minutes in the care of this patient including H and P, review of records, counseling and coordination of care.     Rachel Moulds, MD 11/18/2022 11:41 AM

## 2022-11-25 ENCOUNTER — Telehealth: Payer: Self-pay

## 2022-11-25 NOTE — Telephone Encounter (Signed)
Spoke with Ms. Ackerley regarding her referral to GYN oncology. She has an appointment scheduled with Dr. Alvester Morin on 12/05/22 at 9:00. Patient agrees to date and time. She has been provided with office address and location. She is also aware of our mask and visitor policy. Patient verbalized understanding and will call with any questions.

## 2022-12-01 ENCOUNTER — Ambulatory Visit
Admission: RE | Admit: 2022-12-01 | Discharge: 2022-12-01 | Disposition: A | Discharge status: Home / Self Care | Payer: Managed Care, Other (non HMO) | Source: Ambulatory Visit | Attending: Hematology and Oncology | Admitting: Hematology and Oncology

## 2022-12-01 ENCOUNTER — Encounter: Payer: Self-pay | Admitting: Psychiatry

## 2022-12-01 DIAGNOSIS — Z1589 Genetic susceptibility to other disease: Secondary | ICD-10-CM | POA: Insufficient documentation

## 2022-12-01 DIAGNOSIS — Z1231 Encounter for screening mammogram for malignant neoplasm of breast: Secondary | ICD-10-CM | POA: Insufficient documentation

## 2022-12-01 DIAGNOSIS — Z9189 Other specified personal risk factors, not elsewhere classified: Secondary | ICD-10-CM | POA: Diagnosis present

## 2022-12-05 ENCOUNTER — Encounter: Payer: Self-pay | Admitting: Psychiatry

## 2022-12-05 ENCOUNTER — Inpatient Hospital Stay: Payer: Managed Care, Other (non HMO) | Attending: Psychiatry | Admitting: Psychiatry

## 2022-12-05 VITALS — BP 126/72 | HR 83 | Temp 99.1°F | Resp 16 | Wt 165.2 lb

## 2022-12-05 DIAGNOSIS — Z8041 Family history of malignant neoplasm of ovary: Secondary | ICD-10-CM | POA: Diagnosis not present

## 2022-12-05 DIAGNOSIS — Z148 Genetic carrier of other disease: Secondary | ICD-10-CM | POA: Diagnosis present

## 2022-12-05 DIAGNOSIS — Z90721 Acquired absence of ovaries, unilateral: Secondary | ICD-10-CM | POA: Insufficient documentation

## 2022-12-05 DIAGNOSIS — Z1502 Genetic susceptibility to malignant neoplasm of ovary: Secondary | ICD-10-CM | POA: Insufficient documentation

## 2022-12-05 DIAGNOSIS — N920 Excessive and frequent menstruation with regular cycle: Secondary | ICD-10-CM | POA: Insufficient documentation

## 2022-12-05 DIAGNOSIS — N946 Dysmenorrhea, unspecified: Secondary | ICD-10-CM | POA: Diagnosis not present

## 2022-12-05 DIAGNOSIS — Z1501 Genetic susceptibility to malignant neoplasm of breast: Secondary | ICD-10-CM | POA: Diagnosis present

## 2022-12-05 DIAGNOSIS — Z1589 Genetic susceptibility to other disease: Secondary | ICD-10-CM

## 2022-12-05 DIAGNOSIS — Z8042 Family history of malignant neoplasm of prostate: Secondary | ICD-10-CM | POA: Diagnosis not present

## 2022-12-05 DIAGNOSIS — F1020 Alcohol dependence, uncomplicated: Secondary | ICD-10-CM | POA: Insufficient documentation

## 2022-12-05 NOTE — Patient Instructions (Addendum)
It was a pleasure to see you in clinic today. - Recommend pelvic exam q95mo until removal of remaining ovary at age 41-50. - Recommend follow-up with your Ob/Gyn to re-discuss, consideration options for possible treatment of painful menses, heavy menstrual bleeding. - Pelvic ultrasound ordered - Return visit planned for 1 year unless indicated sooner. Please give our office a call at [phone] closer to October 2025 to schedule your appointment.  Thank you very much for allowing me to provide care for you today.  I appreciate your confidence in choosing our Gynecologic Oncology team at [facility].  If you have any questions about your visit today please call our office or send [place] a MyChart message and we will get back to you as soon as possible.

## 2022-12-05 NOTE — Progress Notes (Signed)
GYNECOLOGIC ONCOLOGY NEW PATIENT CONSULTATION  Date of Service: 12/05/2022 Referring Provider: Rachel Moulds, MD   ASSESSMENT AND PLAN: Alicia Carr is a 42 y.o. woman with RAD51C mutation and dysmenorrhea.  The risk of ovarian cancer in patients with RAD51C mutations is approximately 9-11% lifetime.  In addition, patients with RAD51C mutations are also at increased risk of breast cancer.  The [employer] (NCCN) recommends removal of bilateral ovaries and fallopian tubes between the ages of 55-50.  Reviewed that NCCN guidelines removed recommendation for screening with CA-125 and pelvic ultrasounds every 6 months given limited limited sensitivity of these screening procedures and no evidence on impact of survival, but these tools can be considered at clinician's discretion. Preoperative CA125 and pelvic ultrasound are recommended.   Although long-term oral contraceptive use is suggested to reduce the risk of ovarian cancer among women who carry mutations, data on the effect of oral contraceptives on breast cancer risk are inconsistent.  In the setting of patient's concern for dysmenorrhea and heavy menstrual bleeding, we will plan to repeat pelvic ultrasound.  Otherwise, recommend that patient discuss further with her OB/GYN.  Was previously offered IUD per patient report.  We reviewed that OCPs may also be considered.  Patient reports that she is unable to take NSAIDs in the setting of her liver disease.  In terms of surveillance for her mutation until risk reducing surgery, recommend every 6 month pelvic exam.  Can alternate with her primary OB/GYN.  RTC 1 year, unless indicated sooner.  A copy of this note was sent to the patient's referring provider.  Clide Cliff, MD Gynecologic Oncology   Medical Decision Making I personally spent  TOTAL 61 minutes face-to-face and non-face-to-face in the care of this patient, which includes all pre, intra, and  post visit time on the date of service.  ------------  CC: RAD51C mutation  HISTORY OF PRESENT ILLNESS:  Alicia Carr is a 42 y.o. woman who is seen in consultation at the request of Rachel Moulds, MD for evaluation of RAD51C mutation.  Pt presented to medical oncology for high risk breast cancer in the setting of RAD51C mutation. 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 This resulted with a RAD51C mutation.  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 And she was previously evaluated by Genetics on 12/01/22.  Today patient reports concerns surrounding her menstrual cycle.  She reports pressure on her left side of her abdomen that gets more comfortable with closer she gets to her menses.  Sometimes she has shooting pain down the left leg as well.  The pain improves when her period ends.  She also reports heavy menstrual bleeding.  This was noted prior to her surgery last year.  She reports that it temporarily improved after her surgery but then has resumed being heavy again.  She uses pads and tampons.  She reports that she was discussed considering an IUD but opted to forego this.  She feels like her pain has worsened in the past year.   PAST MEDICAL HISTORY: Past Medical History:  Diagnosis Date   Alcoholism (HCC)    Anemia    Early hepatic fibrosis    Family history of ovarian cancer    Family history of prostate cancer    Gallstones    Genetic testing 11/29/2021   History  of chicken pox    Hyperpigmentation    Longitudinal melanonychia    Nevus    right dorsal wrist   Positive ANA (antinuclear antibody)    Primary sclerosing cholangitis    Pruritus    Raynaud's syndrome    Shingles 07/18/2020   Vaginal delivery 2003    PAST SURGICAL HISTORY: Past Surgical History:  Procedure  Laterality Date   BILATERAL SALPINGECTOMY  12/03/2021   at time of RSO   CESAREAN SECTION  08/08/2006   CHOLECYSTECTOMY  2003   COLONOSCOPY  09/2020   CYSTOSCOPY  10/17/2022   [facility], Microscopic Hematuria, Repeat Micro UA in 1  year.   LIVER BIOPSY  05/2020   ROBOTIC ASSISTED SALPINGO OOPHERECTOMY Bilateral 12/03/2021   Procedure: XI ROBOTIC ASSISTED BILATERAL SALPINGECTOMY AND RIGHT OOPHORECTOMY;  Surgeon: Conard Novak, MD;  Location: [facility] ORS;  Service: Gynecology;  Laterality: Bilateral;    OB/GYN HISTORY: OB History  Gravida Para Term Preterm AB Living  2 2 2         SAB IAB Ectopic Multiple Live Births               # Outcome Date GA Lbr Len/2nd Weight Sex Type Anes PTL Lv  2 Term      CS-Unspec     1 Term      Vag-Spont         Age at menarche: 13/15 Age at menopause: n/a Hx of HRT: OCPs as teenager (~1-2 years) Hx of STI: chlamydia (college, treated) Last pap: 11/29/2019 NILM, HPV HR neg History of abnormal pap smears: abnormal pap smear, early 20s, no procedures  SCREENING STUDIES:  Last mammogram: Sept 2024 Last colonoscopy: 2022  MEDICATIONS:  Current Outpatient Medications:    loteprednol (LOTEMAX) 0.5 % ophthalmic suspension, Place 1 drop into the left eye. Three times a week, Disp: , Rfl:    VITAMIN D, CHOLECALCIFEROL, PO, Take 1 tablet by mouth daily., Disp: , Rfl:    hydrocortisone (ANUSOL-HC) 2.5 % rectal cream, Place 1 Application rectally 2 (two) times daily. For 1 week then prn (Patient not taking: Reported on 12/01/2022), Disp: 30 g, Rfl: 1   meclizine (ANTIVERT) 25 MG tablet, Take 25 mg by mouth as needed for dizziness. (Patient not taking: Reported on 12/01/2022), Disp: , Rfl:   ALLERGIES: No Known Allergies  FAMILY HISTORY: Family History  Problem Relation Age of Onset   Mental illness Mother        paranoid schizophrenia   Hyperlipidemia Mother    Alcohol abuse Father    Colon polyps Father    Hypertension Father    Prostate  cancer Father 20   Heart murmur Brother    Diabetes Paternal Aunt    Cancer Maternal Grandmother        uncertain type   Healthy Daughter    Asthma Daughter    Learning disabilities Daughter    ADD / ADHD Daughter    Anxiety disorder Daughter    Healthy Son    Ovarian cancer Other        PGF sister   Colon cancer Paternal Uncle    Ovarian cancer Paternal Great-grandmother    Breast cancer Neg Hx    Endometrial cancer Neg Hx    Pancreatic cancer Neg Hx     SOCIAL HISTORY: Social History   Socioeconomic History   Marital status: Married    Spouse name: Weston Brass   Number of children: 2   Years of education: Not on file  Highest education level: Bachelor's degree (e.g., BA, AB, BS)  Occupational History   Occupation: Systems developer  Tobacco Use   Smoking status: Never    Passive exposure: Past   Smokeless tobacco: Never  Vaping Use   Vaping status: Never Used  Substance and Sexual Activity   Alcohol use: Yes    Alcohol/week: 3.0 standard drinks of alcohol    Types: 3 Glasses of wine per week    Comment: social   Drug use: No   Sexual activity: Yes    Birth control/protection: None    Comment: My husband cannot have children  Other Topics Concern   Not on file  Social History Narrative   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, retail for [employer]   1 son born 2003 1 daughter born 2008   Alcohol use regular for 10+ years stopped 01/18/2020 was 2 to 3 glasses of wine daily   No drug use   Coffee and tea 2 or 3 a day for caffeine   Social Determinants of Health   Financial Resource Strain: Not on file  Food Insecurity: No Food Insecurity (12/01/2022)   Hunger Vital Sign    Worried About Running Out of Food in the Last Year: Never true    Ran Out of Food in the Last Year: Never true  Transportation Needs: No Transportation Needs (12/01/2022)   PRAPARE - Administrator, Civil Service (Medical): No    Lack of Transportation  (Non-Medical): No  Physical Activity: Not on file  Stress: Not on file  Social Connections: Not on file  Intimate Partner Violence: Not on file    REVIEW OF SYSTEMS: New patient intake form was reviewed.  Complete 10-system review is negative except for the following: Constipation, urinary frequency, menstrual problems, joint pain, easy bruising/bleeding, blood in urine, pelvic pain, numbness, fatigue, abdominal pain, pain with intercourse, dizziness, ringing in ears, hyperpigmentation  PHYSICAL EXAM: BP 126/72 (BP Location: Right Arm, Patient Position: Sitting)   Pulse 83   Temp 99.1 F (37.3 C) (Oral)   Resp 16   Wt 165 lb 3.2 oz (74.9 kg)   SpO2 100%   BMI 32.26 kg/m  Constitutional: No acute distress. Neuro/Psych: Alert, oriented.  Head and Neck: Normocephalic, atraumatic. Neck symmetric without masses. Sclera anicteric.  Respiratory: Normal work of breathing. Clear to auscultation bilaterally. Cardiovascular: Regular rate and rhythm, no murmurs, rubs, or gallops. Abdomen: Normoactive bowel sounds. Soft, non-distended, non-tender to palpation. No masses appreciated. Well healed laparoscopic and pfannenstiel incisions. Extremities: Grossly normal range of motion. Warm, well perfused. No edema bilaterally. Skin: No rashes or lesions. Lymphatic: No cervical, supraclavicular, or inguinal adenopathy. Genitourinary: External genitalia without lesions. Urethral meatus without lesions or prolapse. On speculum exam, vagina and cervix without lesions.Small menstrual blood in vaginal vault. Bimanual exam reveals normal cervix and uterus. Mildly TTP. No adnexal mass or nodularity. Mild fullness of the left adnexa and TTP.  Exam chaperoned by Warner Mccreedy, NP   LABORATORY AND RADIOLOGIC DATA: Outside medical records were reviewed to synthesize the above history, along with the history and physical obtained during the visit.  Outside laboratory, pathology, and imaging reports were reviewed,  with pertinent results below.  I personally reviewed the outside images.  WBC  Date Value Ref Range Status  02/14/2022 7.8 4.0 - 10.5 K/uL Final   Hemoglobin  Date Value Ref Range Status  02/14/2022 11.9 (L) 12.0 - 15.0 g/dL Final  40/98/1191 47.8 11.1 - 15.9  g/dL Final   HCT  Date Value Ref Range Status  02/14/2022 38.1 36.0 - 46.0 % Final   Hematocrit  Date Value Ref Range Status  08/24/2016 37.5 34.0 - 46.6 % Final   Platelets  Date Value Ref Range Status  02/14/2022 270 150 - 400 K/uL Final  08/24/2016 273 150 - 379 x10E3/uL Final   LDH  Date Value Ref Range Status  08/24/2016 237 (H) 119 - 226 IU/L Final   Creat  Date Value Ref Range Status  12/02/2019 0.82 0.50 - 1.10 mg/dL Final   Creatinine, Ser  Date Value Ref Range Status  02/14/2022 0.88 0.44 - 1.00 mg/dL Final   AST  Date Value Ref Range Status  02/14/2022 24 15 - 41 U/L Final   ALT  Date Value Ref Range Status  02/14/2022 16 0 - 44 U/L Final   Diagnosis  Date Value Ref Range Status  11/29/2019   Final   - Negative for intraepithelial lesion or malignancy (NILM)      US Pelvic Complete With Transvaginal 03/18/2021  Narrative CLINICAL DATA:  Enlarged uterus  EXAM: TRANSABDOMINAL AND TRANSVAGINAL ULTRASOUND OF PELVIS  TECHNIQUE: Both transabdominal and transvaginal ultrasound examinations of the pelvis were performed. Transabdominal technique was performed for global imaging of the pelvis including uterus, ovaries, adnexal regions, and pelvic cul-de-sac. It was necessary to proceed with endovaginal exam following the transabdominal exam to visualize the uterus.  COMPARISON:  CT abdomen and pelvis dated March 17, 2021  FINDINGS: Uterus  Measurements: 12.4 x 4.5 x 5.5 cm = volume: 162 mL. Anteverted. No fibroids or other mass visualized. Nabothian cysts incidentally noted  Endometrium  Thickness: 10.  No focal abnormality visualized.  Right ovary  Measurements: 3.1 x 2.1 x  3.9 cm = volume: 12.9 mL. Echogenic right adnexal mass measuring 1.7 x 1.0 x 0.3 cm with questionable areas of internal vascularity.  Left ovary  Measurements: 2.5 x 1.4 x 2.3 cm = volume: 4.1 mL. Normal appearance/no adnexal mass. Simple appearing left para ovarian cyst measuring 0.7 x 0.7 x 067 cm.  Other findings  No abnormal free fluid.  IMPRESSION: 1. Uterus is enlarged heterogeneous. No fibroid or other mass visualized. 2. Echogenic lesion mass of the right ovary measuring up to 1.7 cm, likely a dermoid cyst, although there is questionable internal vascularity. Recommend pelvic MRI for further evaluation.   Electronically Signed By: Allegra Lai M.D. On: 03/18/2021 15:55

## 2022-12-08 ENCOUNTER — Ambulatory Visit (HOSPITAL_COMMUNITY)
Admission: RE | Admit: 2022-12-08 | Discharge: 2022-12-08 | Disposition: A | Discharge status: Home / Self Care | Payer: Managed Care, Other (non HMO) | Source: Ambulatory Visit | Attending: Psychiatry | Admitting: Psychiatry

## 2022-12-08 DIAGNOSIS — N946 Dysmenorrhea, unspecified: Secondary | ICD-10-CM | POA: Diagnosis present

## 2022-12-08 DIAGNOSIS — Z1589 Genetic susceptibility to other disease: Secondary | ICD-10-CM | POA: Insufficient documentation

## 2022-12-10 DIAGNOSIS — Z23 Encounter for immunization: Secondary | ICD-10-CM | POA: Diagnosis not present

## 2022-12-12 ENCOUNTER — Other Ambulatory Visit: Payer: Self-pay | Admitting: Physician Assistant

## 2022-12-12 ENCOUNTER — Encounter: Payer: Self-pay | Admitting: Physician Assistant

## 2022-12-12 DIAGNOSIS — M7989 Other specified soft tissue disorders: Secondary | ICD-10-CM

## 2022-12-19 NOTE — Progress Notes (Signed)
Alicia Carr is a 42 y.o. female and is here for a comprehensive physical exam.  HPI There are no preventive care reminders to display for this patient. No chief complaint on file.  Acute Concerns: {ExamConcerns:31114}  Chronic Issues: {ExamConcerns:31114}  Health Maintenance: Immunizations -- *** Colonoscopy -- Last done 10/09/20, due to hematochezia. External hemorrhoids found, otherwise normal. Repeat 2032. Mammogram-- Last done 12/01/22. C density category, otherwise normal. Repeat 2025. PAP-- Last done 11/29/19. Results were normal. Bone Density-- Last done 12/10/20. Results were normal. Repeat at 42 y.o. Diet -- {CPE Diet/Exercise:30649} Exercise -- {CPE Diet/Exercise:30649}  Sleep habits -- {CPE Mood/Sleep:30650} Mood -- {CPE Mood/Sleep:30650}  UTD with dentist? - {Eye Doctor/Dentist:30651} UTD with eye doctor? - {Eye Doctor/Dentist:30651}  Weight history: Wt Readings from Last 10 Encounters:  12/05/22 165 lb 3.2 oz (74.9 kg)  11/18/22 165 lb 9.6 oz (75.1 kg)  08/09/22 154 lb (69.9 kg)  06/14/22 141 lb (64 kg)  05/02/22 144 lb 12.8 oz (65.7 kg)  02/10/22 149 lb (67.6 kg)  02/08/22 145 lb (65.8 kg)  12/27/21 141 lb 9.6 oz (64.2 kg)  12/14/21 137 lb (62.1 kg)  12/03/21 138 lb 14.2 oz (63 kg)   There is no height or weight on file to calculate BMI. No LMP recorded.  Alcohol use:  reports current alcohol use of about 3.0 standard drinks of alcohol per week.  Tobacco use:  Tobacco Use: Low Risk  (12/15/2022)   Received from [facility]   Patient History    Smoking Tobacco Use: Never    Smokeless Tobacco Use: Never    Passive Exposure: Not on file   Eligible for lung cancer screening? ***     08/09/2022    1:10 PM  Depression screen PHQ 2/9  Decreased Interest 0  Down, Depressed, Hopeless 0  PHQ - 2 Score 0  Altered sleeping 2  Tired, decreased energy 1  Change in appetite 1  Feeling bad or failure about yourself  0  Trouble concentrating 0   Moving slowly or fidgety/restless 0  Suicidal thoughts 0  PHQ-9 Score 4  Difficult doing work/chores Not difficult at all     Other providers/specialists: Patient Care Team: Jarold Motto, [place] as PCP - General (Physician Assistant)   PMHx, SurgHx, SocialHx, Medications, and Allergies were reviewed in the Visit Navigator and updated as appropriate.   Past Medical History:  Diagnosis Date   Alcoholism (HCC)    Anemia    Early hepatic fibrosis    Family history of ovarian cancer    Family history of prostate cancer    Gallstones    Genetic testing 11/29/2021   History of chicken pox    Hyperpigmentation    Longitudinal melanonychia    Nevus    right dorsal wrist   Positive ANA (antinuclear antibody)    Primary sclerosing cholangitis    Pruritus    Raynaud's syndrome    Shingles 07/18/2020   Vaginal delivery 2003    Past Surgical History:  Procedure Laterality Date   BILATERAL SALPINGECTOMY  12/03/2021   at time of RSO   CESAREAN SECTION  08/08/2006   CHOLECYSTECTOMY  2003   COLONOSCOPY  09/2020   CYSTOSCOPY  10/17/2022   [facility], Microscopic Hematuria, Repeat Micro UA in 1  year.   LIVER BIOPSY  05/2020   ROBOTIC ASSISTED SALPINGO OOPHERECTOMY Bilateral 12/03/2021   Procedure: XI ROBOTIC ASSISTED BILATERAL SALPINGECTOMY AND RIGHT OOPHORECTOMY;  Surgeon: Conard Novak, MD;  Location: [facility] ORS;  Service:  Gynecology;  Laterality: Bilateral;   Family History  Problem Relation Age of Onset   Mental illness Mother        paranoid schizophrenia   Hyperlipidemia Mother    Alcohol abuse Father    Colon polyps Father    Hypertension Father    Prostate cancer Father 16   Heart murmur Brother    Diabetes Paternal Aunt    Cancer Maternal Grandmother        uncertain type   Healthy Daughter    Asthma Daughter    Learning disabilities Daughter    ADD / ADHD Daughter    Anxiety disorder Daughter    Healthy Son    Ovarian cancer Other        PGF  sister   Colon cancer Paternal Uncle    Ovarian cancer Paternal Great-grandmother    Breast cancer Neg Hx    Endometrial cancer Neg Hx    Pancreatic cancer Neg Hx    Social History   Tobacco Use   Smoking status: Never    Passive exposure: Past   Smokeless tobacco: Never  Vaping Use   Vaping status: Never Used  Substance Use Topics   Alcohol use: Yes    Alcohol/week: 3.0 standard drinks of alcohol    Types: 3 Glasses of wine per week    Comment: social   Drug use: No   Review of Systems:   ROS See pertinent positives and negatives as per the HPI.  Objective:   There were no vitals taken for this visit. There is no height or weight on file to calculate BMI.   General Appearance:    Alert, cooperative, no distress, appears stated age  Head:    Normocephalic, without obvious abnormality, atraumatic  Eyes:    PERRL, conjunctiva/corneas clear, EOM's intact, fundi    benign, both eyes  Ears:    Normal TM's and external ear canals, both ears  Nose:   Nares normal, septum midline, mucosa normal, no drainage    or sinus tenderness  Throat:   Lips, mucosa, and tongue normal; teeth and gums normal  Neck:   Supple, symmetrical, trachea midline, no adenopathy;    thyroid:  no enlargement/tenderness/nodules; no carotid   bruit or JVD  Back:     Symmetric, no curvature, ROM normal, no CVA tenderness  Lungs:     Clear to auscultation bilaterally, respirations unlabored  Chest Wall:    No tenderness or deformity   Heart:    Regular rate and rhythm, S1 and S2 normal, no murmur, rub or gallop  Breast Exam:    ***No tenderness, masses, or nipple abnormality  Abdomen:     Soft, non-tender, bowel sounds active all four quadrants,    no masses, no organomegaly  Genitalia:    ***Normal female without lesion, discharge or tenderness  Extremities:   Extremities normal, atraumatic, no cyanosis or edema  Pulses:   2+ and symmetric all extremities  Skin:   Skin color, texture, turgor normal,  no rashes or lesions  Lymph nodes:   Cervical, supraclavicular, and axillary nodes normal  Neurologic:   CNII-XII intact, normal strength, sensation and reflexes    throughout    Assessment/Plan:   There are no diagnoses linked to this encounter.          I,Emily Lagle,acting as a Neurosurgeon for [employer], PA.,have documented all relevant documentation on the behalf of Jarold Motto, PA,as directed by  Jarold Motto, PA while in the presence of  Jarold Motto, PA.  *** (refresh reminder)  I, Jarold Motto, PA, have reviewed all documentation for this visit. The documentation on 12/19/22 for the exam, diagnosis, procedures, and orders are all accurate and complete.  Jarold Motto, PA-C [facility]

## 2022-12-20 ENCOUNTER — Ambulatory Visit: Payer: Managed Care, Other (non HMO) | Admitting: Physician Assistant

## 2022-12-20 ENCOUNTER — Encounter: Payer: Self-pay | Admitting: Physician Assistant

## 2022-12-20 VITALS — BP 110/70 | HR 76 | Temp 97.3°F | Ht 60.0 in | Wt 160.2 lb

## 2022-12-20 DIAGNOSIS — E669 Obesity, unspecified: Secondary | ICD-10-CM | POA: Diagnosis not present

## 2022-12-20 DIAGNOSIS — Z0000 Encounter for general adult medical examination without abnormal findings: Secondary | ICD-10-CM | POA: Diagnosis not present

## 2022-12-20 DIAGNOSIS — R5383 Other fatigue: Secondary | ICD-10-CM

## 2022-12-20 DIAGNOSIS — Z1589 Genetic susceptibility to other disease: Secondary | ICD-10-CM

## 2022-12-20 DIAGNOSIS — R1012 Left upper quadrant pain: Secondary | ICD-10-CM

## 2022-12-20 DIAGNOSIS — Z0189 Encounter for other specified special examinations: Secondary | ICD-10-CM

## 2022-12-20 DIAGNOSIS — R319 Hematuria, unspecified: Secondary | ICD-10-CM | POA: Diagnosis not present

## 2022-12-20 LAB — COMPREHENSIVE METABOLIC PANEL
ALT: 11 U/L (ref 0–35)
AST: 18 U/L (ref 0–37)
Albumin: 4.5 g/dL (ref 3.5–5.2)
Alkaline Phosphatase: 66 U/L (ref 39–117)
BUN: 10 mg/dL (ref 6–23)
CO2: 23 meq/L (ref 19–32)
Calcium: 9.6 mg/dL (ref 8.4–10.5)
Chloride: 105 meq/L (ref 96–112)
Creatinine, Ser: 0.89 mg/dL (ref 0.40–1.20)
GFR: 80.72 mL/min (ref >60.00)
Glucose, Bld: 78 mg/dL (ref 70–99)
Potassium: 5.3 meq/L — ABNORMAL HIGH (ref 3.5–5.1)
Sodium: 139 meq/L (ref 135–145)
Total Bilirubin: 0.3 mg/dL (ref 0.2–1.2)
Total Protein: 7.5 g/dL (ref 6.0–8.3)

## 2022-12-20 LAB — CBC WITH DIFFERENTIAL/PLATELET
Basophils Absolute: 0 10*3/uL (ref 0.0–0.1)
Basophils Relative: 0.5 % (ref 0.0–3.0)
Eosinophils Absolute: 0.1 10*3/uL (ref 0.0–0.7)
Eosinophils Relative: 1.2 % (ref 0.0–5.0)
HCT: 38.9 % (ref 36.0–46.0)
Hemoglobin: 12.1 g/dL (ref 12.0–15.0)
Lymphocytes Relative: 23 % (ref 12.0–46.0)
Lymphs Abs: 1.4 10*3/uL (ref 0.7–4.0)
MCHC: 31.1 g/dL (ref 30.0–36.0)
MCV: 81.2 fL (ref 78.0–100.0)
Monocytes Absolute: 0.3 10*3/uL (ref 0.1–1.0)
Monocytes Relative: 4.9 % (ref 3.0–12.0)
Neutro Abs: 4.1 10*3/uL (ref 1.4–7.7)
Neutrophils Relative %: 70.4 % (ref 43.0–77.0)
Platelets: 309 10*3/uL (ref 150.0–400.0)
RBC: 4.79 Mil/uL (ref 3.87–5.11)
RDW: 16.5 % — ABNORMAL HIGH (ref 11.5–15.5)
WBC: 5.9 10*3/uL (ref 4.0–10.5)

## 2022-12-20 LAB — IBC + FERRITIN
Ferritin: 16.1 ng/mL (ref 10.0–291.0)
Iron: 89 ug/dL (ref 42–145)
Saturation Ratios: 18 % — ABNORMAL LOW (ref 20.0–50.0)
TIBC: 494.2 ug/dL — ABNORMAL HIGH (ref 250.0–450.0)
Transferrin: 353 mg/dL (ref 212.0–360.0)

## 2022-12-20 LAB — LIPID PANEL
Cholesterol: 210 mg/dL — ABNORMAL HIGH (ref 0–200)
HDL: 62.9 mg/dL (ref >39.00)
LDL Cholesterol: 125 mg/dL — ABNORMAL HIGH (ref 0–99)
NonHDL: 147.09
Total CHOL/HDL Ratio: 3
Triglycerides: 109 mg/dL (ref 0.0–149.0)
VLDL: 21.8 mg/dL (ref 0.0–40.0)

## 2022-12-20 LAB — URINALYSIS, ROUTINE W REFLEX MICROSCOPIC
Bilirubin Urine: NEGATIVE
Leukocytes,Ua: NEGATIVE
Nitrite: NEGATIVE
Specific Gravity, Urine: 1.01 (ref 1.000–1.030)
Total Protein, Urine: NEGATIVE
Urine Glucose: NEGATIVE
Urobilinogen, UA: 0.2 (ref 0.0–1.0)
pH: 6 (ref 5.0–8.0)

## 2022-12-20 LAB — HEMOGLOBIN A1C: Hgb A1c MFr Bld: 5.3 % (ref 4.6–6.5)

## 2022-12-20 NOTE — Patient Instructions (Addendum)
It was great to see you!  We will be in touch when forms are completed.  Please go to the lab for blood work.   Our office will call you with your results unless you have chosen to receive results via MyChart.  If your blood work is normal we will follow-up each year for physicals and as scheduled for chronic medical problems.  If anything is abnormal we will treat accordingly and get you in for a follow-up.  Take care,  Lelon Mast

## 2022-12-21 ENCOUNTER — Encounter: Payer: Self-pay | Admitting: Psychiatry

## 2022-12-21 ENCOUNTER — Encounter: Payer: Self-pay | Admitting: Physician Assistant

## 2022-12-21 ENCOUNTER — Other Ambulatory Visit: Payer: Self-pay | Admitting: Physician Assistant

## 2022-12-21 DIAGNOSIS — E875 Hyperkalemia: Secondary | ICD-10-CM

## 2022-12-22 ENCOUNTER — Other Ambulatory Visit (INDEPENDENT_AMBULATORY_CARE_PROVIDER_SITE_OTHER): Payer: Managed Care, Other (non HMO)

## 2022-12-22 DIAGNOSIS — E875 Hyperkalemia: Secondary | ICD-10-CM | POA: Diagnosis not present

## 2022-12-22 LAB — BASIC METABOLIC PANEL
BUN: 15 mg/dL (ref 6–23)
CO2: 25 meq/L (ref 19–32)
Calcium: 9.3 mg/dL (ref 8.4–10.5)
Chloride: 105 meq/L (ref 96–112)
Creatinine, Ser: 1 mg/dL (ref 0.40–1.20)
GFR: 70.18 mL/min (ref >60.00)
Glucose, Bld: 92 mg/dL (ref 70–99)
Potassium: 4.1 meq/L (ref 3.5–5.1)
Sodium: 136 meq/L (ref 135–145)

## 2022-12-26 ENCOUNTER — Encounter: Payer: Self-pay | Admitting: Physician Assistant

## 2022-12-26 LAB — NICOTINE/COTININE METABOLITES
Cotinine: <1 ng/mL
Nicotine: <1 ng/mL

## 2022-12-26 NOTE — Telephone Encounter (Signed)
Patient requests to be called with status of Biometric Screening and and A1C forms. See previous message.

## 2023-01-04 ENCOUNTER — Ambulatory Visit: Payer: Managed Care, Other (non HMO) | Attending: Cardiovascular Disease | Admitting: Cardiovascular Disease

## 2023-01-04 ENCOUNTER — Encounter: Payer: Self-pay | Admitting: Cardiovascular Disease

## 2023-01-04 VITALS — BP 130/82 | HR 78 | Ht 61.0 in | Wt 159.0 lb

## 2023-01-04 DIAGNOSIS — I7300 Raynaud's syndrome without gangrene: Secondary | ICD-10-CM | POA: Diagnosis not present

## 2023-01-04 NOTE — Patient Instructions (Signed)
Medication Instructions:  No changes *If you need a refill on your cardiac medications before your next appointment, please call your pharmacy*  Follow-Up: At [facility], you and your health needs are our priority.  As part of our continuing mission to provide you with exceptional heart care, we have created designated Provider Care Teams.  These Care Teams include your primary Cardiologist (physician) and Advanced Practice Providers (APPs -  Physician Assistants and Nurse Practitioners) who all work together to provide you with the care you need, when you need it.  We recommend signing up for the patient portal called "MyChart".  Sign up information is provided on this After Visit Summary.  MyChart is used to connect with patients for Virtual Visits (Telemedicine).  Patients are able to view lab/test results, encounter notes, upcoming appointments, etc.  Non-urgent messages can be sent to your provider as well.   To learn more about what you can do with MyChart, go to [email or web address].    Your next appointment:    Follow up as needed  Provider:   Dr Sallyanne Kuster

## 2023-01-05 NOTE — Progress Notes (Signed)
Cardiology Office Note:  .   Date:  01/05/2023  ID:  Alicia Carr, DOB Sep 09, 1980, MRN [id number] PCP: Jarold Motto, PA  [facility] Providers Cardiologist:  None    History of Present Illness: .   Alicia Carr is a 42 y.o. female referred in consultation by Jarold Motto for numerous complaints concerning for poor circulation.  Alicia Carr feels exhausted "all the time".  She complains of swelling in her ankles but also in the phalanges of her fingers.  She complains of increased pigmentation of her skin and some atypical rashes.  She complains of discoloration of her fingernail bed.  She has had recurrent problems with shingles in her left eye and has been told that she may have a suppressed immune system.  She complains that her fingers turn purple, although this does not occur in response to cold.  Due to suspicion of possible Raynaud's syndrome she was treated with amlodipine, nitrates, sildenafil, none of which led to any improvement in her symptoms.  She had biopsy of skin lesion on her right wrist.  I cannot see the actual report but she was told that there was some nonspecific inflammatory change.  Routine labs have been normal.  She bears a diagnosis of sclerosing cholangitis (see biopsy April 2022), but this is reportedly extremely mild (see MRCP 2022).  She has previously had cholecystectomy.  Her ANA is elevated and a very low titer of 1: 80 with a fine speckled pattern.  Extensive testing for other, more specific serological tests for autoimmune disease have all been negative.  She has no evidence of diabetes mellitus or adrenal disease and has normal thyroid function tests there is no evidence of HIV or chronic hepatitis.  He is even had screening for heavy metals, negative.  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  Imaging studies have included an  echocardiogram performed in February 2023 which is a completely normal study.  She had a normal Holter monitor around the same time.  Due to her complaints of poor circulation and pain in her left upper extremity and suspicion for new syndrome she had an arterial Doppler study performed in December 2023 with normal blood flow.  ROS: She does not have exertional dyspnea or angina, but he is easily fatigued.  She is tired, but does not have true hypersomnolence.  She does not have palpitations and has not experienced syncope.  Does not have orthopnea, PND or claudication.  No clear focal neurological symptoms, but does have paresthesias and stinging and discomfort, especially on her left side.  Studies Reviewed: Marland Kitchen      Extensive review of labs, echocardiogram, arrhythmia monitor, upper extremity arterial Doppler, multiple imaging studies (including MRI of the brain, MRI of the cervical and thoracic spine, MRI of the pelvis, ultrasound and CT of the abdomen).   Risk Assessment/Calculations:             Physical Exam:   VS:  BP 130/82 (BP Location: Left Arm, Patient Position: Sitting, Cuff Size: Normal)   Pulse 78   Ht 5\' 1"  (1.549 m)   Wt 159 lb (72.1 kg)   LMP 12/02/2022 (Exact Date)   SpO2 98%   BMI 30.04 kg/m    Wt Readings from Last 3 Encounters:  01/04/23 159 lb (72.1 kg)  12/20/22 160 lb 4 oz (72.7 kg)  12/05/22 165 lb 3.2 oz (74.9 kg)  GEN: Well nourished, well developed in no acute distress NECK: No JVD; No carotid bruits CARDIAC: RRR, no murmurs, rubs, gallops RESPIRATORY:  Clear to auscultation without rales, wheezing or rhonchi  ABDOMEN: Soft, non-tender, non-distended EXTREMITIES:  No edema; No deformity   ASSESSMENT AND PLAN: .     I do not find any evidence of structural cardiac abnormalities or vascular disease, despite Alicia Carr very extensive workup.  I do not think her complaints of skin discoloration are consistent with Raynaud's syndrome or another vascular  abnormality.    Her constellation of complaints is quite varied and she is very frustrated with the inability to make a diagnosis despite having been evaluated by numerous medical specialists.  Sarcoidosis is a consideration in view of the multisystem complaints involving skin and joints, her age/gender/ethnic background, but previous imaging studies have not shown evidence of significant lymphadenopathy.  Liver biopsy did not show granulomatous changes.  Not sure about the results of her skin biopsy.  At this point I do not think further cardiology workup would be fruitful.  She can follow-up as needed.       Dispo: Follow-up as needed  Signed, Thurmon Fair, MD

## 2023-01-10 ENCOUNTER — Encounter: Payer: Self-pay | Admitting: Physician Assistant

## 2023-01-10 ENCOUNTER — Encounter: Payer: Self-pay | Admitting: Internal Medicine

## 2023-02-08 ENCOUNTER — Encounter: Payer: Self-pay | Admitting: Hematology and Oncology

## 2023-02-08 ENCOUNTER — Telehealth: Payer: Self-pay | Admitting: *Deleted

## 2023-02-08 ENCOUNTER — Encounter: Payer: Self-pay | Admitting: Psychiatry

## 2023-02-08 ENCOUNTER — Encounter: Payer: Self-pay | Admitting: Physician Assistant

## 2023-02-08 NOTE — Telephone Encounter (Signed)
Spoke with Alicia Carr who sent message through MyChart for Dr. Alvester Morin in regards to follow up recommendations. Per Dr. Leonard Blas progress note on 12/05/22 Recommendations are every 6 months for pelvic exam and patient can alternate with primary OB/GYN and return to office in 1 year. Advised patient to make an appt. With her OB/GYN in April and then call our office to schedule an appointment with Dr. Alvester Morin in October 2025. Pt verbalized understanding and thanked the office for calling.

## 2023-02-09 ENCOUNTER — Other Ambulatory Visit: Payer: Self-pay | Admitting: *Deleted

## 2023-02-09 DIAGNOSIS — Z1589 Genetic susceptibility to other disease: Secondary | ICD-10-CM

## 2023-02-09 DIAGNOSIS — Z9189 Other specified personal risk factors, not elsewhere classified: Secondary | ICD-10-CM

## 2023-02-27 ENCOUNTER — Encounter: Payer: Self-pay | Admitting: Diagnostic Neuroimaging

## 2023-03-23 ENCOUNTER — Encounter: Payer: Self-pay | Admitting: Physician Assistant

## 2023-03-23 ENCOUNTER — Telehealth: Payer: Managed Care, Other (non HMO) | Admitting: Physician Assistant

## 2023-03-23 VITALS — Ht 61.0 in | Wt 152.0 lb

## 2023-03-23 DIAGNOSIS — R6889 Other general symptoms and signs: Secondary | ICD-10-CM | POA: Diagnosis not present

## 2023-03-23 NOTE — Progress Notes (Signed)
I acted as a Neurosurgeon for [employer], PA-C Corky Mull, LPN  Virtual Visit via Video Note   I, Jarold Motto, PA, connected with  Alicia Carr  ([id number], Apr 17, 1980) on 03/23/23 at  8:20 AM EST by a video-enabled telemedicine application and verified that I am speaking with the correct person using two identifiers.  Location: Patient: Home Provider: [facility] [place] office   I discussed the limitations of evaluation and management by telemedicine and the availability of in person appointments. The patient expressed understanding and agreed to proceed.    History of Present Illness: Alicia Carr is a 43 y.o. who identifies as a female who was assigned female at birth, and is being seen today for diarrhea.   Pt c/o diarrhea since 1/13 x 1 week -- denies blood/n/v/abdomen pain. She actually has not had a bowel movement in the past 24 hours. Did not take any medications to slow down stools Confirms multiple norovirus exposures at work  Maintaining hydration and eating very bland foods  Has developed cough and rever starting Tuesday 102.5, this morning 101 Home COVID test Negative.  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 cause 06/18/2020    Allergies:  No Known Allergies Medications:  Current Outpatient Medications:    ferrous sulfate 325 (65 FE) MG tablet, Take 325 mg by mouth daily with breakfast., Disp: , Rfl:    hydrocortisone (ANUSOL-HC) 2.5 % rectal cream, Place 1 Application rectally 2 (two) times daily. For 1 week then prn, Disp: 30 g, Rfl: 1   meclizine (ANTIVERT) 25 MG tablet, Take 25 mg by mouth as needed for dizziness., Disp: , Rfl:    valACYclovir (VALTREX) 1000 MG tablet, Take 1,000 mg by mouth 2 (two) times daily., Disp: , Rfl:    VITAMIN D, CHOLECALCIFEROL, PO, Take 1 tablet by mouth daily., Disp: , Rfl:   Observations/Objective: Patient is well-developed, well-nourished in no acute distress.  Resting comfortably  at home.  Head is normocephalic, atraumatic.  No labored breathing.  Speech is clear and coherent with logical content.  Patient is alert and oriented at baseline.   Assessment and Plan: 1. Flu-like symptoms (Primary) No red flags Diarrhea resolved however if returns -- will order stool testing and consider blood work Upper respiratory infection (URI) symptom(s) recently started -- likely viral  Push fluids Continue OTC (available over the counter without a prescription) tylenol for as needed fever  Reviewed return precautions including new or worsening fever, SOB, new or worsening cough or other concerns.  Push fluids and rest.  I recommend that patient follow-up if symptoms worsen or persist despite treatment x 7-10 days, sooner if needed.   Follow Up Instructions: I discussed the assessment and treatment plan with the patient. The patient was provided an opportunity to ask questions and all were answered.  The patient agreed with the plan and demonstrated an understanding of the instructions.  A copy of instructions were sent to the patient via MyChart unless otherwise noted below.   The patient was advised to call back or seek an in-person evaluation if the symptoms worsen or if the condition fails to  improve as anticipated.  Jarold Motto, [place]

## 2023-03-27 ENCOUNTER — Encounter: Payer: Self-pay | Admitting: Physician Assistant

## 2023-03-29 ENCOUNTER — Ambulatory Visit: Payer: Managed Care, Other (non HMO) | Admitting: Physician Assistant

## 2023-03-29 VITALS — BP 130/80 | HR 70 | Temp 98.4°F | Ht 61.0 in | Wt 154.0 lb

## 2023-03-29 DIAGNOSIS — R051 Acute cough: Secondary | ICD-10-CM

## 2023-03-29 DIAGNOSIS — R194 Change in bowel habit: Secondary | ICD-10-CM

## 2023-03-29 LAB — CBC WITH DIFFERENTIAL/PLATELET
Basophils Absolute: 0 10*3/uL (ref 0.0–0.1)
Basophils Relative: 0.8 % (ref 0.0–3.0)
Eosinophils Absolute: 0.1 10*3/uL (ref 0.0–0.7)
Eosinophils Relative: 2.1 % (ref 0.0–5.0)
HCT: 41.1 % (ref 36.0–46.0)
Hemoglobin: 13.6 g/dL (ref 12.0–15.0)
Lymphocytes Relative: 24.3 % (ref 12.0–46.0)
Lymphs Abs: 1.4 10*3/uL (ref 0.7–4.0)
MCHC: 33 g/dL (ref 30.0–36.0)
MCV: 91.8 fL (ref 78.0–100.0)
Monocytes Absolute: 0.4 10*3/uL (ref 0.1–1.0)
Monocytes Relative: 6.9 % (ref 3.0–12.0)
Neutro Abs: 3.9 10*3/uL (ref 1.4–7.7)
Neutrophils Relative %: 65.9 % (ref 43.0–77.0)
Platelets: 283 10*3/uL (ref 150.0–400.0)
RBC: 4.48 Mil/uL (ref 3.87–5.11)
RDW: 13.6 % (ref 11.5–15.5)
WBC: 6 10*3/uL (ref 4.0–10.5)

## 2023-03-29 LAB — COMPREHENSIVE METABOLIC PANEL
ALT: 36 U/L — ABNORMAL HIGH (ref 0–35)
AST: 42 U/L — ABNORMAL HIGH (ref 0–37)
Albumin: 4.5 g/dL (ref 3.5–5.2)
Alkaline Phosphatase: 120 U/L — ABNORMAL HIGH (ref 39–117)
BUN: 18 mg/dL (ref 6–23)
CO2: 27 meq/L (ref 19–32)
Calcium: 9.1 mg/dL (ref 8.4–10.5)
Chloride: 107 meq/L (ref 96–112)
Creatinine, Ser: 0.82 mg/dL (ref 0.40–1.20)
GFR: 88.89 mL/min (ref >60.00)
Glucose, Bld: 86 mg/dL (ref 70–99)
Potassium: 3.8 meq/L (ref 3.5–5.1)
Sodium: 141 meq/L (ref 135–145)
Total Bilirubin: 0.2 mg/dL (ref 0.2–1.2)
Total Protein: 7.4 g/dL (ref 6.0–8.3)

## 2023-03-29 LAB — LIPASE: Lipase: 28 U/L (ref 11.0–59.0)

## 2023-03-29 NOTE — Progress Notes (Signed)
Alicia Carr is a 43 y.o. female here for a new problem.  History of Present Illness:   Chief Complaint  Patient presents with   GI Problem    Pt c/o ongoing GI issues.   GI Issues She complains of  Gi issues mainly diarrhea after heavy meals. Stool is mostly watery. These issues have been persisted for the past few weeks. She has been taking a probiotic on and off.  She does report taking berberine 2 months ago. She tolerated it well but had stopped it since the onset of her GI issues. She states that she had pizza recently but had several episodes of diarrhea as a result.   Her diet has been very minimal and bland in order to avoid diarrhea, other than the recent pizza. Has been experiencing stress due to her daughter's health.  Associated symptoms include heartburn/reflux.  She has not taken any OTC acid reducers.  Denies any urinary symptoms.   Cough  She continues to complain of a cough that she feels is initiated from her chest. She had the flu few weeks ago. Most symptoms have resolved.  Denies any nasal drip.     Past Medical History:  Diagnosis Date   Alcoholism (HCC)    Anemia    Early hepatic fibrosis    Family history of ovarian cancer    Family history of prostate cancer    Gallstones    Genetic testing 11/29/2021   History of chicken pox    Hyperpigmentation    Longitudinal melanonychia    Nevus    right dorsal wrist   Positive ANA (antinuclear antibody)    Primary sclerosing cholangitis    Pruritus    Raynaud's syndrome    Shingles 07/18/2020   Vaginal delivery 2003     Social History   Tobacco Use   Smoking status: Never    Passive exposure: Past   Smokeless tobacco: Never  Vaping Use   Vaping status: Never Used  Substance Use Topics   Alcohol use: Yes    Alcohol/week: 1.0 standard drink of alcohol    Types: 1 Glasses of wine per week    Comment: Drinking when going out to eat and special occasions only   Drug use: No    Past  Surgical History:  Procedure Laterality Date   BILATERAL SALPINGECTOMY  12/03/2021   at time of RSO   CESAREAN SECTION  08/08/2006   CHOLECYSTECTOMY  2003   COLONOSCOPY  09/2020   CYSTOSCOPY  10/17/2022   [facility], Microscopic Hematuria, Repeat Micro UA in 1  year.   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    Family History  Problem Relation Age of Onset   Mental illness Mother        paranoid schizophrenia   Hyperlipidemia Mother    Alcohol abuse Father    Colon polyps Father    Hypertension Father    Prostate cancer Father 69   Heart murmur Brother    Hypertension Brother    Cancer Maternal Grandmother        uncertain type   Healthy Daughter    Asthma Daughter    Learning disabilities Daughter    ADD /  ADHD Daughter    Anxiety disorder Daughter    Healthy Son    Diabetes Paternal Aunt    Colon cancer Paternal Uncle    Ovarian cancer Paternal Great-grandmother    Ovarian cancer Other        PGF sister   Breast cancer Neg Hx    Endometrial cancer Neg Hx    Pancreatic cancer Neg Hx     No Known Allergies  Current Medications:   Current Outpatient Medications:    ferrous sulfate 325 (65 FE) MG tablet, Take 325 mg by mouth daily with breakfast., Disp: , Rfl:    hydrocortisone (ANUSOL-HC) 2.5 % rectal cream, Place 1 Application rectally 2 (two) times daily. For 1 week then prn, Disp: 30 g, Rfl: 1   meclizine (ANTIVERT) 25 MG tablet, Take 25 mg by mouth as needed for dizziness., Disp: , Rfl:    valACYclovir (VALTREX) 1000 MG tablet, Take 1,000 mg by mouth 2 (two) times daily., Disp: , Rfl:    VITAMIN D, CHOLECALCIFEROL, PO, Take 1 tablet by mouth daily.,  Disp: , Rfl:    Review of Systems:   Review of Systems  Respiratory:  Positive for cough.   Gastrointestinal:  Positive for diarrhea and heartburn.    Vitals:   Vitals:   03/29/23 1029  BP: 130/80  Pulse: 70  Temp: 98.4 F (36.9 C)  TempSrc: Temporal  SpO2: 98%  Weight: 154 lb (69.9 kg)  Height: 5\' 1"  (1.549 m)     Body mass index is 29.1 kg/m.  Physical Exam:   Physical Exam Vitals and nursing note reviewed.  Constitutional:      General: She is not in acute distress.    Appearance: She is well-developed. She is not ill-appearing or toxic-appearing.  HENT:     Head: Normocephalic and atraumatic.     Right Ear: Tympanic membrane, ear canal and external ear normal. Tympanic membrane is not erythematous, retracted or bulging.     Left Ear: Tympanic membrane, ear canal and external ear normal. Tympanic membrane is not erythematous, retracted or bulging.     Nose: Nose normal.     Right Sinus: No maxillary sinus tenderness or frontal sinus tenderness.     Left Sinus: No maxillary sinus tenderness or frontal sinus tenderness.     Mouth/Throat:     Pharynx: Uvula midline. No posterior oropharyngeal erythema.  Eyes:     General: Lids are normal.     Conjunctiva/sclera: Conjunctivae normal.  Neck:     Trachea: Trachea normal.  Cardiovascular:     Rate and Rhythm: Normal rate and regular rhythm.     Heart sounds: Normal heart sounds, S1 normal and S2 normal.  Pulmonary:     Effort: Pulmonary effort is normal.     Breath sounds: Normal breath sounds. No decreased breath sounds, wheezing, rhonchi or rales.  Abdominal:     General: Abdomen is flat. Bowel sounds are normal.     Palpations: Abdomen is soft.     Tenderness: There is no abdominal tenderness. There is no right CVA tenderness, left CVA tenderness, guarding or rebound.  Lymphadenopathy:     Cervical: No cervical adenopathy.  Skin:    General: Skin is warm and dry.  Neurological:     Mental Status: She is  alert.  Psychiatric:        Speech: Speech normal.        Behavior: Behavior normal. Behavior is cooperative.     Assessment and Plan:  Bowel habit changes No red flags Possible ongoing viral illness Will update blood work and complete stool test Recommend OTC (available over the counter without a prescription) acid reducing medication to help with reflux symptom(s) Recommend probiotic Continue bland foods Consider follow-up with gastroenterology if persists  Acute cough No red flags on exam.   Suspect viral upper respiratory infection (URI) which is improving Reviewed return precautions including new or worsening fever, SOB, new or worsening cough or other concerns.  Push fluids and rest.  I recommend that patient follow-up if symptoms worsen or persist despite treatment x 7-10 days, sooner if needed.   Jarold Motto, PA-C  I,Safa M Kadhim,acting as a scribe for Jarold Motto, PA.,have documented all relevant documentation on the behalf of Jarold Motto, PA,as directed by  Jarold Motto, PA while in the presence of Jarold Motto, [place].   I, Jarold Motto, [place], have reviewed all documentation for this visit. The documentation on 03/29/23 for the exam, diagnosis, procedures, and orders are all accurate and complete.

## 2023-03-29 NOTE — Patient Instructions (Signed)
It was great to see you!  Bland bland bland foods  At least 64 oz water daily  We will get testing today and I will be in touch  Start daily OTC (available over the counter without a prescription) acid reducer such as Prilosec or Pepcid (generic is fine) to calm down your symptom(s)   Restart probiotic as well  Keep me posted on symptom(s)   Take care,  Jarold Motto PA-C

## 2023-03-30 ENCOUNTER — Encounter: Payer: Self-pay | Admitting: Physician Assistant

## 2023-04-01 LAB — GI PROFILE, STOOL, PCR

## 2023-04-02 ENCOUNTER — Other Ambulatory Visit: Payer: Self-pay | Admitting: Physician Assistant

## 2023-04-02 DIAGNOSIS — R7989 Other specified abnormal findings of blood chemistry: Secondary | ICD-10-CM

## 2023-04-05 ENCOUNTER — Encounter: Payer: Self-pay | Admitting: Physician Assistant

## 2023-04-06 ENCOUNTER — Other Ambulatory Visit (INDEPENDENT_AMBULATORY_CARE_PROVIDER_SITE_OTHER): Payer: Managed Care, Other (non HMO)

## 2023-04-06 ENCOUNTER — Other Ambulatory Visit: Payer: Self-pay | Admitting: Physician Assistant

## 2023-04-06 DIAGNOSIS — R7989 Other specified abnormal findings of blood chemistry: Secondary | ICD-10-CM | POA: Diagnosis not present

## 2023-04-06 LAB — HEPATIC FUNCTION PANEL
ALT: 22 U/L (ref 0–35)
AST: 26 U/L (ref 0–37)
Albumin: 4.3 g/dL (ref 3.5–5.2)
Alkaline Phosphatase: 86 U/L (ref 39–117)
Bilirubin, Direct: 0 mg/dL (ref 0.0–0.3)
Total Bilirubin: 0.3 mg/dL (ref 0.2–1.2)
Total Protein: 7.1 g/dL (ref 6.0–8.3)

## 2023-04-06 MED ORDER — AZITHROMYCIN 250 MG PO TABS: ORAL_TABLET | ORAL | 0 refills | Status: AC

## 2023-04-07 ENCOUNTER — Encounter: Payer: Self-pay | Admitting: Physician Assistant

## 2023-04-19 ENCOUNTER — Encounter: Payer: Self-pay | Admitting: Physician Assistant

## 2023-04-19 ENCOUNTER — Encounter: Payer: Self-pay | Admitting: Cardiovascular Disease

## 2023-04-25 ENCOUNTER — Encounter: Payer: Self-pay | Admitting: Diagnostic Neuroimaging

## 2023-04-25 ENCOUNTER — Ambulatory Visit: Payer: Managed Care, Other (non HMO) | Admitting: Diagnostic Neuroimaging

## 2023-04-25 VITALS — BP 122/78 | HR 94 | Ht 63.0 in | Wt 160.2 lb

## 2023-04-25 DIAGNOSIS — R202 Paresthesia of skin: Secondary | ICD-10-CM

## 2023-04-25 DIAGNOSIS — R200 Anesthesia of skin: Secondary | ICD-10-CM | POA: Diagnosis not present

## 2023-04-25 DIAGNOSIS — R519 Headache, unspecified: Secondary | ICD-10-CM

## 2023-04-25 MED ORDER — DULOXETINE HCL 30 MG PO CPEP
30.0000 mg | ORAL_CAPSULE | Freq: Every day | ORAL | 6 refills | Status: DC
Start: 2023-04-25 — End: 2023-11-22

## 2023-04-25 NOTE — Patient Instructions (Signed)
  NUMBNESS / TWITCHING / BRAIN FOG / VISUAL CHANGES (since ~ 2022 shingles; some intermixed migraine phenomenon / HA) - check MRI brain w/wo (rule out causes for worsening left HA symptoms) - ibuprofen as needed; consider triptan - trial of duloxetine for numbness and anxiety

## 2023-04-25 NOTE — Progress Notes (Signed)
 [facility]  [name] DOB: [birth year]  REFERRING CLINICIAN: Jarold Motto, PA HISTORY FROM: patient  REASON FOR VISIT: follow up   HISTORICAL  CHIEF COMPLAINT:  Chief Complaint  Patient presents with   Follow-up    Pt in room 7 alone. Here for tingling/numbness has worsened. Bilateral hand and feet numbness, has noticed on tongue as well. Has noticed cluster headaches also, ringing in left ear is back, last eye appointment Jan 2025, has shaky vision at time, eye doctor said exam normal.    HISTORY OF PRESENT ILLNESS:   UPDATE (04/25/23, VRP): Since last visit, doing about the same until last year, had worsening left eye symptoms and possible left eye shingles recurrence. Continues with diffuse numbness symptoms. Also with intermittent left eye headache, throbbing, ringing in left ear, left ptosis, 2 per month, lasting hours or whole day.   UPDATE (05/25/21, VRP): Since last visit, doing about the same. Symptoms are continuing. Severity is moderate. Numbness, twitching, vision changes, fingernail changes, color changes. Has positive ANA. Now on plaquenil per rheumatology.   PRIOR HPI: 43 year old female here for evaluation of numbness tingling, twitching.  Jul 18, 2020 patient had onset of throbbing headache and sensitive to light.  Within a few days she developed rash over her left forehead and left periorbital region.  She was diagnosed with shingles and treated with prednisone and antivirals.  Over the next few days she developed numbness and tingling in her hands, feet, twitching in the muscles, decreased tone strength, decreased grip strength.  Over the past week her symptoms have worsened.  Patient had been having some issues with memory loss of brain fog even before shingles attack.  She has had some abnormal vision changes.   REVIEW OF SYSTEMS: Full 14 system review of systems performed and negative with exception of: as per HPI.    ALLERGIES: No Known Allergies  HOME MEDICATIONS: Outpatient Medications Prior to Visit  Medication Sig Dispense Refill   ferrous sulfate 325 (65 FE) MG tablet Take 325 mg by mouth daily with breakfast.     hydrocortisone (ANUSOL-HC) 2.5 % rectal cream Place 1 Application rectally 2 (two) times daily. For 1 week then prn 30 g 1   meclizine (ANTIVERT) 25 MG tablet Take 25 mg by mouth as needed for dizziness.     valACYclovir (VALTREX) 1000 MG tablet Take 1,000 mg by mouth 2 (two) times daily.     VITAMIN D, CHOLECALCIFEROL, PO Take 1 tablet by mouth daily.     No facility-administered medications prior to visit.    PAST MEDICAL HISTORY: Past Medical History:  Diagnosis Date   Alcoholism (HCC)    Anemia    Early hepatic fibrosis    Family history of ovarian cancer    Family history of prostate cancer    Gallstones    Genetic testing 11/29/2021   History of chicken pox    Hyperpigmentation    Longitudinal melanonychia    Nevus    right dorsal wrist   Positive ANA (antinuclear antibody)    Primary sclerosing cholangitis    Pruritus    Raynaud's syndrome    Shingles 07/18/2020   Vaginal delivery 2003    PAST SURGICAL HISTORY: Past Surgical History:  Procedure Laterality Date   BILATERAL SALPINGECTOMY  12/03/2021   at time of RSO   CESAREAN SECTION  08/08/2006   CHOLECYSTECTOMY  2003   COLONOSCOPY  09/2020   CYSTOSCOPY  10/17/2022   [facility], Microscopic Hematuria,  Repeat Micro UA in 1  year.   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    FAMILY HISTORY: Family History  Problem Relation Age of Onset   Mental illness Mother        paranoid schizophrenia    Hyperlipidemia Mother    Alcohol abuse Father    Colon polyps Father    Hypertension Father    Prostate cancer Father 26   Heart murmur Brother    Hypertension Brother    Cancer Maternal Grandmother        uncertain type   Healthy Daughter    Asthma Daughter    Learning disabilities Daughter    ADD / ADHD Daughter    Anxiety disorder Daughter    Healthy Son    Diabetes Paternal Aunt    Colon cancer Paternal Uncle    Ovarian cancer Paternal Great-grandmother    Ovarian cancer Other        PGF sister   Breast cancer Neg Hx    Endometrial cancer Neg Hx    Pancreatic cancer Neg Hx     SOCIAL HISTORY: Social History   Socioeconomic History   Marital status: Married    Spouse name: Alicia Carr   Number of children: 2   Years of education: Not on file   Highest education level: Bachelor's degree (e.g., BA, AB, BS)  Occupational History   Occupation: Systems developer  Tobacco Use   Smoking status: Never    Passive exposure: Past   Smokeless tobacco: Never  Vaping Use   Vaping status: Never Used  Substance and Sexual Activity   Alcohol use: Yes    Alcohol/week: 1.0 standard drink of alcohol    Types: 1 Glasses of wine per week    Comment: Drinking when going out to eat and special occasions only   Drug use: No   Sexual activity: Yes    Birth control/protection: Surgical, None    Comment: My husband cannot have children  Other Topics Concern   Not on file  Social History Narrative   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, retail for [employer]   1 son born 2003 1 daughter born 2008   Alcohol use regular for 10+ years stopped 01/18/2020 was 2 to 3 glasses of wine daily   No drug use   Coffee and tea 2 or 3 a day for caffeine   Social Drivers of Corporate investment banker Strain: Medium Risk (03/29/2023)   Overall Financial Resource Strain (CARDIA)    Difficulty of Paying Living Expenses: Somewhat hard  Food Insecurity: No Food Insecurity (03/29/2023)    Hunger Vital Sign    Worried About Running Out of Food in the Last Year: Never Alicia Carr    Ran Out of Food in the Last Year: Never Alicia Carr  Transportation Needs: No Transportation Needs (03/29/2023)   PRAPARE - Administrator, Civil Service (Medical): No    Lack of Transportation (Non-Medical): No  Physical Activity: Insufficiently Active (03/29/2023)   Exercise Vital Sign    Days of Exercise per Week: 1 day    Minutes of Exercise per Session: 30 min  Stress: No Stress Concern Present (03/29/2023)  [employer] of Occupational Health - Occupational Stress Questionnaire    Feeling of Stress : Only a little  Social Connections: Moderately Integrated (03/29/2023)   Social Connection and Isolation Panel [NHANES]    Frequency of Communication with Friends and Family: Once a week    Frequency of Social Gatherings with Friends and Family: Once a week    Attends Religious Services: More than 4 times per year    Active Member of [employer] or Organizations: Yes    Attends Engineer, structural: More than 4 times per year    Marital Status: Married  Catering manager Violence: Not on file     PHYSICAL EXAM  GENERAL EXAM/CONSTITUTIONAL: Vitals:  Vitals:   04/25/23 1503  BP: 122/78  Pulse: 94  Weight: 160 lb 3.2 oz (72.7 kg)  Height: 5\' 3"  (1.6 m)   Body mass index is 28.38 kg/m. Wt Readings from Last 3 Encounters:  04/25/23 160 lb 3.2 oz (72.7 kg)  03/29/23 154 lb (69.9 kg)  03/23/23 152 lb (68.9 kg)   Patient is in no distress; well developed, nourished and groomed; neck is supple  CARDIOVASCULAR: Examination of carotid arteries is normal; no carotid bruits Regular rate and rhythm, no murmurs Examination of peripheral vascular system by observation and palpation is normal  EYES: Ophthalmoscopic exam of optic discs and posterior segments is normal; no papilledema or hemorrhages No results found.  MUSCULOSKELETAL: Gait, strength, tone, movements noted in  Neurologic exam below  NEUROLOGIC: MENTAL STATUS:      No data to display         awake, alert, oriented to person, place and time recent and remote memory intact normal attention and concentration language fluent, comprehension intact, naming intact fund of knowledge appropriate  CRANIAL NERVE:  2nd - no papilledema on fundoscopic exam 2nd, 3rd, 4th, 6th - pupils equal and reactive to light, visual fields full to confrontation, extraocular muscles intact, no nystagmus 5th - facial sensation symmetric 7th - facial strength symmetric 8th - hearing intact 9th - palate elevates symmetrically, uvula midline 11th - shoulder shrug symmetric 12th - tongue protrusion midline  MOTOR:  normal bulk and tone, full strength in the BUE, BLE  SENSORY:  normal and symmetric to light touch  COORDINATION:  finger-nose-finger, fine finger movements normal  REFLEXES:  deep tendon reflexes present and symmetric  GAIT/STATION:  narrow based gait     DIAGNOSTIC DATA (LABS, IMAGING, TESTING) - I reviewed patient records, labs, notes, testing and imaging myself where available.  Lab Results  Component Value Date   WBC 6.0 03/29/2023   HGB 13.6 03/29/2023   HCT 41.1 03/29/2023   MCV 91.8 03/29/2023   PLT 283.0 03/29/2023      Component Value Date/Time   NA 141 03/29/2023 1305   NA 144 08/24/2016 0900   K 3.8 03/29/2023 1305   CL 107 03/29/2023 1305   CO2 27 03/29/2023 1305   GLUCOSE 86 03/29/2023 1305   BUN 18 03/29/2023 1305   BUN 17 08/24/2016 0900   CREATININE 0.82 03/29/2023 1305   CREATININE 0.82 12/02/2019 0823   CALCIUM 9.1 03/29/2023 1305   PROT 7.1 04/06/2023 0948   PROT 6.8 05/25/2021 1612   ALBUMIN 4.3 04/06/2023 0948   ALBUMIN 4.5 12/21/2016 1222   AST 26 04/06/2023 0948   ALT 22 04/06/2023 0948   ALKPHOS 86 04/06/2023 0948   BILITOT 0.3 04/06/2023 0948   BILITOT <0.2 12/21/2016 1222   GFRNONAA >60 02/14/2022 2122   GFRAA  95 08/24/2016 0900   Lab  Results  Component Value Date   CHOL 210 (H) 12/20/2022   HDL 62.90 12/20/2022   LDLCALC 125 (H) 12/20/2022   TRIG 109.0 12/20/2022   CHOLHDL 3 12/20/2022   Lab Results  Component Value Date   HGBA1C 5.3 12/20/2022   Lab Results  Component Value Date   VITAMINB12 650 05/25/2021   Lab Results  Component Value Date   TSH 1.480 05/25/2021    09/09/20 Normal MRI brain (with and without).   09/10/20 EMG/NCS - normal  06/15/21 MRI cervical spine - Unremarkable MRI cervical spine with and without contrast.   06/15/21 MRI thoracic spine - Normal MRI thoracic spine with and without contrast.    ASSESSMENT AND PLAN  43 y.o. year old female here with numbness, twitching, brain fog, vision changes before and after shingles attack in May 2022.  Dx:  1. Numbness and tingling   2. Left-sided headache      PLAN:  NUMBNESS / TWITCHING / BRAIN FOG / VISUAL CHANGES (since ~ 2022 shingles; some intermixed migraine phenomenon / HA) - check MRI brain w/wo (rule out causes for worsening left HA symptoms) - ibuprofen as needed; consider triptan - trial of duloxetine for numbness and anxiety  Orders Placed This Encounter  Procedures   MR BRAIN W WO CONTRAST   Meds ordered this encounter  Medications   DULoxetine (CYMBALTA) 30 MG capsule    Sig: Take 1 capsule (30 mg total) by mouth daily.    Dispense:  30 capsule    Refill:  6   Return in about 6 months (around 10/23/2023).      Suanne Marker, MD 04/25/2023, 3:53 PM Certified in Neurology, Neurophysiology and Neuroimaging  [facility] [address] [place], [place] [address] [phone]

## 2023-04-27 NOTE — Progress Notes (Signed)
 Office Visit Note  [name]             Date of Birth: 1980/07/23           MRN: [id number]             PCP: Jarold Motto, PA Referring: Jarold Motto, PA Visit Date: 05/10/2023   Subjective:  Follow-up (Patient states her joint pain has gotten worse. Patient states her hands and fingers swell and hurt. Patient states sometimes she has pain in her back that will also affect her legs. )   Discussed the use of AI scribe software for clinical note transcription with the patient, who gave verbal consent to proceed.  History of Present Illness   Alicia Carr is a 43 y.o. 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    She experiences worsening joint pain, particularly in her hands, which has progressed to tingling, stiffness, and numbness, making it difficult to hold objects like her cell phone. Pain is also present in her hip and other joints. She has been referred to physical therapy previously. No sclerodactyly or hardening of the skin is noted. Intermittent swelling in her fingers and joint pain primarily in her pinkies are reported.  She associates some of her symptoms with circulation issues, such as blood pooling and micro hemorrhages in her fingers. She has been evaluated by a cardiologist who suggested a rheumatological cause for these symptoms.  She describes a history of hyperpigmentation, with spider veins and brown discoloration around her ankles. Dermatologists have suggested a hormonal cause, leading to a referral to endocrinology, though she has not yet attended that appointment. She also reports non-painful canker sores in her mouth.  She has a history of recurrent shingles and is on ongoing Valtrex treatment. She also experiences frequent eye infections and uses liquid tears for dry eyes. She reports prolonged recovery from illnesses, such as a  three-week episode of diarrhea in January.  She has a history of liver issues, but recent liver tests have been normal. She is concerned about starting duloxetine due to potential liver interactions, although she has not yet begun the medication.    Previous HPI 02/08/2022 Alicia Carr is a 43 y.o. 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  She has been off any specific treatment since last visit besides supplementing vitamin D and eyedrops.  Has not seen any resolution of hyperpigmented areas still has new skin rashes popping up in several areas and dry skin.  With cooler weather she is noticing blue discoloration in her fingers more frequently she is not sure if this is the Raynaud's specifically or just general cold intolerance.  She had genetic testing in September as recommended by Dr. Leone Payor this was not positive for GI association did have mutation with increased risk of breast and ovarian cancer.  So she had right ovary as well as bilateral fallopian tubes removed when going for the ovarian cyst surgery.  Does think she might be having more arthralgias since quitting the hydroxychloroquine but could also be related to cold weather.  Not seeing any obvious swelling or effusions.   10/26/21 Alicia Carr is a 43 y.o. female here for follow up for undifferentiated connective tissue disease symptoms.  Unclear regarding relation of symptoms to underlying diagnosis of early primary sclerosing cholangitis.  She stopped  taking the hydroxychloroquine for concern about a contributing to skin hyperpigmentation has not seen any change regarding this.  She did notice an increase in joint pains particularly around her knuckles and in bilateral knees. This is not debilitating or requiring her to take additional over-the-counter medication to manage.  Raynaud's symptoms have been doing better and she  thinks is associated with the recent warmer weather.  Repeat lab follow-up with normal liver function test.  She followed up with Dr. Leone Payor regarding concerns and has been referred to see a geneticist upcoming appointment on September 6.   08/02/2021 Alicia Carr is a 43 y.o. female here for follow up for raynaud's symptoms, currently taking plaquenil 200 mg by mouth once daily. Since our last visit she feels the joint pain and stiffness improved from before. She also has less raynaud's symptoms than before, although this is not unusual during warmer time of the year. She continues having the small areas of twitching from before and saw Dr. Marjory Lies for this with workup including broad screening antibody testing and imaging of cervical and thoracic spine without major abnormalities. She reports progression of skin hyperpigmentation changes affecting her in multiple areas in patches and also some isolated small hyperpigmented spots on her face and palms and longitudinal changes in her fingernails. She had a nail biopsy consistent with melanotic macule. She saw Elpidio Anis for this problem who had some concern about possible autoimmune vs genetic or proliferative causes. Symptoms started before exposure to hydroxychloroquine but also concern for exacerbating symptoms. She has a lot of healthcare related anxiety surrounding this problem, worse after a coworker passed unexpected from metastatic pancreatic cancer recently. She is drinking alcohol more frequently now and has started to gain weight and feels this is from depressed mood.   05/03/2021 Alicia Carr is a 43 y.o. female here for follow up for raynaud's symptoms after starting sildenafil 20 mg. She continued noticing finger discoloration and rashes. She is also concerned with noticing some hyperpigmentation on the palms of her hands and also darkening of her gums. Still having some twitching and aches and overall concerned she is not feeling well.  She had a mild amount of leg swelling intermittently. She is concerned about if her symptoms could be an adrenal or hormonal issue with ongoing skin changes and generalized symptoms.   04/07/21 Laddie SARYN CHERRY is a 43 y.o. female here for follow up with numerous different symptoms ongoing some in exacerbation.  So far addition of the amlodipine has not significantly helped her finger or toe discoloration is actually having more blue and violaceous changes than before in the fingers.  She feels extremely cold much of the time somewhat regardless of the ambient temperature.  Chronic persistent fatigue remains her most significant issue.  She also continues having muscle twitching or inability to fully close or extend for periods of time the seem to come and go without trigger.  She has some hand swelling no but no increase in leg swelling.  She was feeling some palpitations and wore a heart monitor apparently this showed some episodes of moderate tachycardia but without concerning arrhythmias seen.   10/06/20 Niomi RUDI KNIPPENBERG is a 43 y.o. female here with joint pains and numbness affecting bilateral fingers and toes that is worse especially in the past few months.  She developed shingles outbreak on the face and left eye in May this is treated and symptoms have improved besides some residual hyperpigmentation.  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  She reports morning stiffness difficulty gripping her hands tightly or performing tasks first thing in the morning this improves some time and is normal by the afternoon.  She notices occasional swelling or puffiness especially in the feet but not all the time.  She denies discoloration such as pallor, erythema, or cyanosis.  She tried taking oral diclofenac for joint pains but developed an episode of painless hematochezia so stopped this medication.  She denies any lymphadenopathy, photosensitive rashes, or history of blood clots.   Review of Systems  Constitutional:  Positive for fatigue.  HENT:  Positive for mouth sores. Negative for mouth dryness.   Eyes:  Positive for dryness.  Respiratory:  Negative for shortness of breath.   Cardiovascular:  Negative for chest pain and palpitations.  Gastrointestinal:  Positive for constipation and diarrhea. Negative for blood in stool.  Endocrine: Negative for increased urination.  Genitourinary:  Negative for involuntary urination.  Musculoskeletal:  Positive for joint pain, gait problem, joint pain, joint swelling, myalgias, muscle weakness, morning stiffness, muscle tenderness and myalgias.  Skin:  Positive for color change, hair loss and sensitivity to sunlight. Negative for rash.  Allergic/Immunologic: Positive for susceptible to infections.  Neurological:  Positive for dizziness and headaches.  Hematological:  Negative for swollen glands.  Psychiatric/Behavioral:  Positive for sleep disturbance. Negative for depressed mood. The patient is nervous/anxious.     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 cause 06/18/2020    Past Medical  History:  Diagnosis Date   Alcoholism (HCC)    Anemia    Early hepatic fibrosis    Family history of ovarian cancer    Family history of prostate cancer    Gallstones    Genetic testing 11/29/2021   History of chicken pox    Hyperpigmentation    Longitudinal melanonychia    Nevus    right dorsal wrist   Positive ANA (antinuclear antibody)    Primary sclerosing cholangitis    Pruritus    Raynaud's syndrome    Shingles 07/18/2020   Vaginal delivery 2003    Family History  Problem Relation Age of Onset   Mental illness Mother        paranoid schizophrenia   Hyperlipidemia Mother    Alcohol abuse Father    Colon polyps Father    Hypertension Father    Prostate cancer Father 32   Heart murmur Brother    Hypertension Brother    Cancer Maternal Grandmother        uncertain type   Healthy Daughter    Asthma Daughter    Learning disabilities Daughter    ADD / ADHD Daughter    Anxiety disorder Daughter  Healthy Son    Diabetes Paternal Aunt    Colon cancer Paternal Uncle    Ovarian cancer Paternal Great-grandmother    Ovarian cancer Other        PGF sister   Breast cancer Neg Hx    Endometrial cancer Neg Hx    Pancreatic cancer Neg Hx    Past Surgical History:  Procedure Laterality Date   BILATERAL SALPINGECTOMY  12/03/2021   at time of RSO   CESAREAN SECTION  08/08/2006   CHOLECYSTECTOMY  2003   COLONOSCOPY  09/2020   CYSTOSCOPY  10/17/2022   [facility], Microscopic Hematuria, Repeat Micro UA in 1  year.   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, retail for  [employer]   1 son born 2003 1 daughter born 2008   Alcohol use regular for 10+ years stopped 01/18/2020 was 2 to 3 glasses of wine daily   No drug use   Coffee and tea 2 or 3 a day for caffeine   Immunization History  Administered Date(s) Administered   Hep A / Hep B 09/08/2020, 10/12/2020   Hepatitis A, Adult 03/12/2021   Hepatitis B, PED/ADOLESCENT 03/12/2021, 03/12/2021   Influenza, Seasonal, Injecte, Preservative Fre 12/10/2022   Influenza,inj,Quad PF,6+ Mos 12/26/2018, 11/29/2019, 12/03/2020, 12/14/2021   Influenza,inj,quad, With Preservative 11/29/2019   PFIZER(Purple Top)SARS-COV-2 Vaccination 05/02/2019, 05/23/2019, 01/03/2020, 10/06/2020   Pfizer(Comirnaty)Fall Seasonal Vaccine 12 years and older 12/10/2022   Tdap 05/10/2006, 11/29/2019     Objective: Vital Signs: BP 115/79 (BP Location: Left Arm, Patient Position: Sitting, Cuff Size: Large)   Pulse 93   Resp 14   Ht 5\' 1"  (1.549 m)   Wt 165 lb (74.8 kg)   LMP 04/19/2023   BMI 31.18 kg/m    Physical Exam Eyes:     Conjunctiva/sclera: Conjunctivae normal.  Cardiovascular:     Rate and Rhythm: Normal rate and regular rhythm.  Pulmonary:     Effort: Pulmonary effort is normal.     Breath sounds: Normal breath sounds.  Musculoskeletal:     Right lower leg: No edema.     Left lower leg: No edema.  Lymphadenopathy:     Cervical: No cervical adenopathy.  Skin:    General: Skin is warm and dry.     Comments: Few microhemorrhages other nailfold capillaries normal Melanonychia No digital pitting or telangiectasias Tortuous superficial veins in b/l ankles  Neurological:     Mental Status: She is alert.  Psychiatric:        Mood and Affect: Mood normal.      Musculoskeletal Exam:  Shoulders full ROM no tenderness or swelling Elbows hyperextensible, no swelling or tenderness Wrists full ROM no tenderness or swelling Fingers MCP extension >90 degrees, thumb opposable to forearm Tenderness at L5 region of  lumbar spine Hip normal internal and external rotation without pain, no tenderness to lateral hip palpation Knees full ROM no tenderness or swelling Ankles full ROM no tenderness or swelling   Investigation: No additional findings.  Imaging: No results found.  Recent Labs: Lab Results  Component Value Date   WBC  6.0 03/29/2023   HGB 13.6 03/29/2023   PLT 283.0 03/29/2023   NA 141 03/29/2023   K 3.8 03/29/2023   CL 107 03/29/2023   CO2 27 03/29/2023   GLUCOSE 86 03/29/2023   BUN 18 03/29/2023   CREATININE 0.82 03/29/2023   BILITOT 0.3 04/06/2023   ALKPHOS 86 04/06/2023   AST 26 04/06/2023   ALT 22 04/06/2023   PROT 7.1 04/06/2023   ALBUMIN 4.3 04/06/2023   CALCIUM 9.1 03/29/2023   GFRAA 95 08/24/2016   QFTBGOLDPLUS NEGATIVE 07/23/2020    Speciality Comments: PLQ Eye Exam normal 02/08/2022 W[facility] [employer], OD f/u 6 months  Procedures:  No procedures performed Allergies: Patient has no known allergies.   Assessment / Plan:     Visit Diagnoses: Positive ANA (antinuclear antibody) - Plan: Angiotensin converting enzyme, Sedimentation rate, ANA, C3 and C4, C-reactive protein Multiple ongoing symptoms although nonspecific for AICTD. Rechecking previous abnormla serology also screening sarcoid and checking serum inflammatory markers.  Peripheral neuropathy Worsening tingling, stiffness, and pain in hands, now affecting hip and other joints. Numbness in hands, especially when holding objects. Concerns about duloxetine due to liver condition, but normal liver tests suggest safety. Small micro hemorrhages in fingers could be due to minor trauma or systemic issue. Concerned about developing arthritis in hands. - Start duloxetine for small fiber neuropathy after confirming with primary care provider regarding liver condition. - Recheck labs to monitor liver function and other relevant parameters.  Benign joint hypermobility syndrome Signs of hypermobility causing joint pain due  to low-grade soft tissue strain. Not associated with Ehlers-Danlos syndrome. - Recommend physical therapy and resistance training to strengthen muscles and support joints.  Low back pain Pain in low back, particularly when getting up from a chair, associated with hip pain. Originates from L5 region. Anterior pelvic tilt posture may contribute by causing overengagement of back and hip flexors, leading to strain. - Recommend stretching and exercising to address low back pain and improve posture.  Hyperpigmentation and spider veins Blood pooling, spider veins, and hyperpigmentation in ankles. Normal hormone tests despite symptoms such as chin hair and darkening skin. Upcoming endocrinology appointment in May. - Await endocrinology appointment in May for further evaluation of potential hormonal issues.  Recurrent shingles Managed with ongoing Valtrex therapy.  Recurrent eye infections Recurrent eye infections. Advised to use liquid tears due to dry eyes, which may contribute to infections.  Vitamin D deficiency - Plan: VITAMIN D 25 Hydroxy (Vit-D Deficiency, Fractures), Vitamin D 1,25 dihydroxy Checking Vit D also activated form screening regarding sarcoidosis or CGD.  Health anxiety Significant concern about health, including symptoms of blood pooling, spider veins, hyperpigmentation, tingling, stiffness, and pain in hands and other joints. Health anxiety acknowledged but does not fully explain physical changes. - Provide reassurance regarding health anxiety and discuss potential for symptoms to be self-limiting or to become clearer over time.        Orders: Orders Placed This Encounter  Procedures   VITAMIN D 25 Hydroxy (Vit-D Deficiency, Fractures)   Angiotensin converting enzyme   Vitamin D 1,25 dihydroxy   Sedimentation rate   ANA   C3 and C4   C-reactive protein   Anti-nuclear ab-titer (ANA titer)   No orders of the defined types were placed in this encounter.  40 minutes  spent in encounter today reviewing extensive outside medical notes over preceding year, detailed exam and history, ordering, and documentation.  Follow-Up Instructions: Return in about 3 months (around 08/10/2023) for [facility]/Raynauds ? f/u  3mos.   Fuller Plan, MD  Note - This record has been created using [employer].  Chart creation errors have been sought, but may not always  have been located. Such creation errors do not reflect on  the standard of medical care.

## 2023-04-28 ENCOUNTER — Telehealth: Payer: Self-pay | Admitting: Diagnostic Neuroimaging

## 2023-04-28 NOTE — Telephone Encounter (Signed)
 sent to GI they obtain [place] [facility] [phone]

## 2023-05-10 ENCOUNTER — Ambulatory Visit: Payer: Managed Care, Other (non HMO) | Attending: Internal Medicine | Admitting: Internal Medicine

## 2023-05-10 ENCOUNTER — Encounter: Payer: Self-pay | Admitting: Internal Medicine

## 2023-05-10 VITALS — BP 115/79 | HR 93 | Resp 14 | Ht 61.0 in | Wt 165.0 lb

## 2023-05-10 DIAGNOSIS — R768 Other specified abnormal immunological findings in serum: Secondary | ICD-10-CM

## 2023-05-10 DIAGNOSIS — M357 Hypermobility syndrome: Secondary | ICD-10-CM

## 2023-05-10 DIAGNOSIS — M2550 Pain in unspecified joint: Secondary | ICD-10-CM

## 2023-05-10 DIAGNOSIS — I7300 Raynaud's syndrome without gangrene: Secondary | ICD-10-CM | POA: Diagnosis not present

## 2023-05-10 DIAGNOSIS — K8301 Primary sclerosing cholangitis: Secondary | ICD-10-CM

## 2023-05-10 DIAGNOSIS — M545 Low back pain, unspecified: Secondary | ICD-10-CM

## 2023-05-10 DIAGNOSIS — E559 Vitamin D deficiency, unspecified: Secondary | ICD-10-CM

## 2023-05-10 NOTE — Patient Instructions (Addendum)
 You have some degree of benign joint hypermobility that may be contributing to recurrent joint pains. This often benefits with resistance exercises to strengthen the muscles which protects joints.  Your back pain appears to be more muscular than from arthritis in the back, since yours appears mild from images last year. I suspect some stretching of the back and hip flexors and strengthening of the abdominal and gluteal muscles would help this. I attached some of the basic exercises for this.  I am checking lab tests for the previously positive ANA, the previously normal serum inflammatory markers, and also the ACE level and vitamin D 1,25-OH for evidence of sarcoidosis.

## 2023-05-12 NOTE — Telephone Encounter (Signed)
 Send a referral to physical therapy for her low back pain and posture issue we discussed. I will hold off on sending to additional physicians for hypermobility issues in her arms which were not as much of the symptoms right now.

## 2023-05-14 LAB — VITAMIN D 1,25 DIHYDROXY
Vitamin D 1, 25 (OH)2 Total: 47 pg/mL (ref 18–72)
Vitamin D2 1, 25 (OH)2: <8 pg/mL
Vitamin D3 1, 25 (OH)2: 47 pg/mL

## 2023-05-14 LAB — ANA: Anti Nuclear Antibody (ANA): POSITIVE — AB

## 2023-05-14 LAB — ANTI-NUCLEAR AB-TITER (ANA TITER)
ANA TITER: 1:40 {titer} — ABNORMAL HIGH
ANA Titer 1: 1:80 {titer} — ABNORMAL HIGH

## 2023-05-14 LAB — ANGIOTENSIN CONVERTING ENZYME: Angiotensin-Converting Enzyme: 34 U/L (ref 9–67)

## 2023-05-14 LAB — C3 AND C4
C3 Complement: 173 mg/dL (ref 83–193)
C4 Complement: 26 mg/dL (ref 15–57)

## 2023-05-14 LAB — C-REACTIVE PROTEIN: CRP: <3 mg/L (ref <8.0)

## 2023-05-14 LAB — SEDIMENTATION RATE: Sed Rate: 28 mm/h — ABNORMAL HIGH (ref 0–20)

## 2023-05-14 LAB — VITAMIN D 25 HYDROXY (VIT D DEFICIENCY, FRACTURES): Vit D, 25-Hydroxy: 30 ng/mL (ref 30–100)

## 2023-05-15 ENCOUNTER — Encounter: Payer: Self-pay | Admitting: Internal Medicine

## 2023-05-15 NOTE — Telephone Encounter (Signed)
 Attempted to contact the patient and left a message to call the office back.

## 2023-05-15 NOTE — Telephone Encounter (Signed)
 Patient advised Dr. Dimple Casey Send a referral to physical therapy for her low back pain and posture issue we discussed. Dr. Dimple Casey will hold off on sending to additional physicians for hypermobility issues in her arms which were not as much of the symptoms right now. Patient verbalized understanding.   Patient inquired about her lab results. Please advise.

## 2023-05-17 ENCOUNTER — Encounter: Payer: Self-pay | Admitting: Genetic Counselor

## 2023-05-18 ENCOUNTER — Ambulatory Visit
Admission: RE | Admit: 2023-05-18 | Discharge: 2023-05-18 | Disposition: A | Discharge status: Home / Self Care | Source: Ambulatory Visit | Attending: Diagnostic Neuroimaging | Admitting: Diagnostic Neuroimaging

## 2023-05-18 DIAGNOSIS — R202 Paresthesia of skin: Secondary | ICD-10-CM

## 2023-05-18 DIAGNOSIS — R519 Headache, unspecified: Secondary | ICD-10-CM | POA: Diagnosis not present

## 2023-05-18 DIAGNOSIS — R200 Anesthesia of skin: Secondary | ICD-10-CM | POA: Diagnosis not present

## 2023-05-18 MED ORDER — GADOPICLENOL 0.5 MMOL/ML IV SOLN
7.5000 mL | Freq: Once | INTRAVENOUS | Status: AC | PRN
  Administered 2023-05-18: 7.5 mL via INTRAVENOUS

## 2023-05-18 MED ORDER — GADOPICLENOL 0.5 MMOL/ML IV SOLN: 7.5000 mL | Freq: Once | INTRAVENOUS | Status: DC | PRN

## 2023-05-23 ENCOUNTER — Encounter: Payer: Self-pay | Admitting: Diagnostic Neuroimaging

## 2023-05-23 ENCOUNTER — Telehealth: Payer: Self-pay | Admitting: Diagnostic Neuroimaging

## 2023-05-23 ENCOUNTER — Encounter: Payer: Self-pay | Admitting: Internal Medicine

## 2023-05-23 ENCOUNTER — Telehealth: Payer: Self-pay | Admitting: *Deleted

## 2023-05-23 NOTE — Telephone Encounter (Signed)
 Pt is asking for a call from RN to discuss results to her MRI

## 2023-05-23 NOTE — Telephone Encounter (Signed)
 Patient would like for you to review lab results and advise. Patient is concerned with the increase in her sedimentation rate.

## 2023-05-25 ENCOUNTER — Encounter: Payer: Self-pay | Admitting: Diagnostic Neuroimaging

## 2023-05-25 ENCOUNTER — Encounter: Payer: Self-pay | Admitting: Physician Assistant

## 2023-05-25 DIAGNOSIS — R894 Abnormal immunological findings in specimens from other organs, systems and tissues: Secondary | ICD-10-CM

## 2023-06-26 ENCOUNTER — Ambulatory Visit: Attending: Internal Medicine

## 2023-06-26 DIAGNOSIS — M545 Low back pain, unspecified: Secondary | ICD-10-CM | POA: Diagnosis not present

## 2023-06-26 DIAGNOSIS — M6281 Muscle weakness (generalized): Secondary | ICD-10-CM | POA: Insufficient documentation

## 2023-06-26 DIAGNOSIS — M5459 Other low back pain: Secondary | ICD-10-CM | POA: Diagnosis present

## 2023-06-26 DIAGNOSIS — G8929 Other chronic pain: Secondary | ICD-10-CM | POA: Diagnosis not present

## 2023-06-26 NOTE — Therapy (Signed)
 OUTPATIENT PHYSICAL THERAPY THORACOLUMBAR EVALUATION   Patient Name: Alicia Carr MRN: [id number] DOB:1980-06-03, 43 y.o., female Today's Date: 06/26/2023  END OF SESSION:  PT End of Session - 06/26/23 0900     Visit Number 1    Number of Visits 25    Date for PT Re-Evaluation 09/18/23    PT Start Time 0900    PT Stop Time 0940    PT Time Calculation (min) 40 min    Activity Tolerance Patient tolerated treatment well    Behavior During Therapy [facility] for tasks assessed/performed             Past Medical History:  Diagnosis Date   Alcoholism (HCC)    Anemia    Early hepatic fibrosis    Family history of ovarian cancer    Family history of prostate cancer    Gallstones    Genetic testing 11/29/2021   History of chicken pox    Hyperpigmentation    Longitudinal melanonychia    Nevus    right dorsal wrist   Positive ANA (antinuclear antibody)    Primary sclerosing cholangitis    Pruritus    Raynaud's syndrome    Shingles 07/18/2020   Vaginal delivery 2003   Past Surgical History:  Procedure Laterality Date   BILATERAL SALPINGECTOMY  12/03/2021   at time of RSO   CESAREAN SECTION  08/08/2006   CHOLECYSTECTOMY  2003   COLONOSCOPY  09/2020   CYSTOSCOPY  10/17/2022   [facility], Microscopic Hematuria, Repeat Micro UA in 1  year.   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   Vitamin D  deficiency 05/10/2023   Hepatic steatosis 08/17/2022   Right ovarian cyst 12/03/2021   Monoallelic mutation of RAD51C gene 11/30/2021   Genetic testing 11/29/2021   Family  history of prostate cancer 11/04/2021   Family history of ovarian cancer 11/04/2021   Dermoid cyst of right ovary 10/28/2021   Intolerant of cold 08/02/2021   Menorrhagia 08/02/2021   Folliculitis 06/10/2021   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 cause 06/18/2020    PCP: Alexander Iba, PA  REFERRING PROVIDER: Matt Song, MD  REFERRING DIAG:  M54.50,G89.29 (ICD-10-CM) - Chronic bilateral low back pain without sciatica    RATIONALE FOR EVALUATION AND TREATMENT: Rehabilitation  THERAPY DIAG: Other low back pain  Muscle weakness (generalized)  ONSET DATE: Chronic > 1 yr  FOLLOW-UP APPT SCHEDULED WITH REFERRING PROVIDER: Not addressed    SUBJECTIVE:  SUBJECTIVE STATEMENT:  Intermittent diffuse Low Back Pain   PERTINENT HISTORY:   Pt reports to OPPT with chief concern of chronic ow back pain > 1 year.  She reports that sometimes she will stand up from a chair and she will experience diffuse low back pain. She reports that intense episodes of LBP will include intermittent "shooting pain" deep into her thigh. Patient reports focal weakness in both of her legs secondary to intense episodes. 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 Pain aggravated with transitional movements: getting up from a chair, tying a shoe, bending to pick up items. She reports prolonged sitting in an uncomfortable surface (harder chair). She denies b/b changes, saddle paresthesia, abdominal pain,  chills/fever, night sweats, nausea, vomiting, unrelenting pain.   Imaging (Per Chart Review 05/02/2022):  CLINICAL DATA:  Low back and left hip pain for several weeks, no known injury, initial encounter   EXAM: LUMBAR SPINE - 3 VIEW   COMPARISON:  None Available.   FINDINGS: Five lumbar type vertebral bodies are well visualized. Vertebral body height is well maintained. Minimal osteophytic changes are noted. No anterolisthesis is seen. No soft tissue abnormality is noted.  IMPRESSION: Mild degenerative changes of the lumbar spine.   Electronically Signed   By: Violeta Grey M.D.   On: 05/04/2022 00:20  PAIN:    Pain Intensity: Present: 0/10, Best: 0/10, Worst: 8/10 Pain location: Low Back  Pain Quality: intermittent and shooting   Radiating: Yes  Numbness/Tingling: Yes Focal Weakness: Yes 24-hour pain behavior: Activity Dependent  History of prior back injury, pain, surgery, or therapy: Yes Dominant hand: right Imaging: Yes   PRECAUTIONS: Fall  WEIGHT BEARING RESTRICTIONS: No  FALLS: Has patient fallen in last 6 months? No  Living Environment Lives with: lives with their family Lives in: House/apartment Stairs: No Has following equipment at home: None  Prior level of function: Independent  Hobbies: Reading   Patient Goals: Patient would like to reduce pain    OBJECTIVE:  Patient Surveys  Modified Oswestry 14 / 50 = 28.0 %   Cognition Patient is oriented to person, place, and time.  Recent memory is intact.  Remote memory is intact.  Attention span and concentration are intact.  Expressive speech is intact.  Patient's fund of knowledge is within normal limits for educational level.    Gross Musculoskeletal Assessment Tremor: None Bulk: Normal Tone: Normal  GAIT: Distance walked: 5m Assistive device utilized: None Level of assistance: Complete Independence Comments: WNL   Posture: Lumbar lordosis: WNL Iliac crest height: Equal  bilaterally Lumbar lateral shift: Negative  AROM AROM (Normal range in degrees) AROM   Lumbar   Flexion (65) 100%  Extension (30) 100%  Right lateral flexion (25) 100%  Left lateral flexion (25) 100%  Right rotation (30) 100%  Left rotation (30) 100%      Hip Right Left  Flexion (125) WNL WNL  Extension (15)    Abduction (40)    Adduction     Internal Rotation (45) WNL WNL  External Rotation (45) WNL WNL      Knee    Flexion (135)    Extension (0)        Ankle    Dorsiflexion (20)    Plantarflexion (50)    Inversion (35)    Eversion (15)    (* = pain; Blank rows = not tested)  LE MMT: MMT (out of 5) Right  Left*  Hip flexion 4 4  Hip extension    Hip abduction 4- 4-  Hip adduction    Hip internal rotation 4 4-  Hip external rotation 4 4-  Knee flexion 5 5  Knee extension 5 5  Ankle dorsiflexion 5 5  Ankle plantarflexion 5 5  Ankle inversion    Ankle eversion    (* = pain; Blank rows = not tested)  Sensation Grossly intact to light touch throughout bilateral LEs as determined by testing dermatomes L2-S2. Proprioception, stereognosis, and hot/cold testing deferred on this date.  Reflexes R/L Knee Jerk (L3/4): 2+/2+  Ankle Jerk (S1/2): 2+/2+   Muscle Length Hamstrings (90-90 Test): R: Positive for tightness 40 knee ext L: Positive for tightness 55 knee ext  Palpation Location Right Left         Lumbar paraspinals 1 0  Quadratus Lumborum 0 0  Gluteus Maximus 0 0  Gluteus Medius 1 1  Deep hip external rotators 0 1  PSIS 0 0  Fortin's Area (SIJ) 0 0  Greater Trochanter 0 0  (Blank rows = not tested) Graded on 0-4 scale (0 = no pain, 1 = pain, 2 = pain with wincing/grimacing/flinching, 3 = pain with withdrawal, 4 = unwilling to allow palpation)  Passive Accessory Intervertebral Motion Pt reports reproduction of concordant pain at L4-S1, each segment and doesn't improve with continued CPA.   Special Tests Lumbar Radiculopathy and  Discogenic: SLR (SN 92, -LR 0.29): R: Negative L:  Negative Crossed SLR (SP 90): R: Negative L: Negative  Hip: FABER (SN 81): R: Positive for tightness L: Negative FADIR (SN 94): R: Negative L: Positive for concordant pain  Piriformis Syndrome: FAIR Test (SN 88, SP 83):  L: Positive for Pain in hip   FUNCTIONAL TESTS:  30s Sit to Stand Test: 13 Reps  Lateral Step Down: R: Hip IR/ADD, L: Negative  Supine Bridge: B Single Leg Bridge: R/L: Unable to maintain elevated/ neutral pelvis  TODAY'S TREATMENT: DATE: 06/26/2023  Self Care Management:  PT discussion on initial HEP for stretching muscles and strengthening gluteal group  Exercises - Supine Figure 4 Piriformis Stretch  - 2 x daily - 7 x weekly - 30-60s hold - Supine Piriformis Stretch with Foot on Ground  - 2 x daily - 7 x weekly - 30-60s hold - Seated Hamstring Stretch  - 2 x daily - 7 x weekly - 30-60s hold - Seated Piriformis Stretch  - 2 x daily - 7 x weekly - 30-60s hold - Supine Bridge  - 1 x daily - 7 x weekly - 2-3 sets - 12-15 reps - Sidelying Hip Abduction  - 1 x daily - 7 x weekly - 2-3 sets - 12-15 reps  PATIENT EDUCATION:  Education details: POC, HEP, Prognosis  Person educated: Patient Education method: Explanation, Demonstration, and Handouts Education comprehension: verbalized understanding and returned demonstration   HOME EXERCISE PROGRAM:   Access Code: [email or web address] URL: [email or web address] Date: 06/26/2023 Prepared by: Veryl Gottron Daryan Buell  Exercises - Supine Figure 4 Piriformis Stretch  - 2 x daily - 7 x weekly - 30-60s hold - Supine Piriformis Stretch with Foot on Ground  - 2 x daily - 7 x weekly - 30-60s hold - Seated Hamstring Stretch  - 2 x daily - 7 x weekly - 30-60s hold - Seated Piriformis Stretch  - 2 x daily - 7 x weekly - 30-60s hold - Supine Bridge  - 1 x daily - 7 x weekly - 2-3 sets - 12-15 reps -  Sidelying Hip Abduction  - 1 x daily - 7 x weekly - 2-3 sets - 12-15  reps  ASSESSMENT:  CLINICAL IMPRESSION: Patient is a 43 y.o. female who was seen today for physical therapy evaluation and treatment for low back pain. Objective findings significant for LE weakness and concordant pain with FADDIR and FAIR of the L hip. Patient TTP along L deep gluteal, L piriformis muscles and R prarspinals. Lateral step down significant for moderate hip R IR/ADD. Hamstring testing positive for bilateral tightness but no concordant pain. PT suspects that lower back pain could derive from gluteal weakness and tightness. No radiating pain present with lumbar mobilizations. Gait presented with reciprocal pattern and no abnormalities. Provided initial HEP focused on relieving tension with good return demonstration from patient. Next session to include to initial strengthening program for hip. Currently household and community based activities limited due to pain and strength deficits. Based on today's performance, Pt will benefit from skilled PT focused on gluteal strneght  OBJECTIVE IMPAIRMENTS: decreased activity tolerance, decreased endurance, difficulty walking, decreased strength, and pain.   ACTIVITY LIMITATIONS: lifting, bending, sitting, standing, and transfers  PARTICIPATION LIMITATIONS: community activity and walking pet  PERSONAL FACTORS: Age, Past/current experiences, Profession, and 1 comorbidity: benign joint hypermobility  are also affecting patient's functional outcome.   REHAB POTENTIAL: Good  CLINICAL DECISION MAKING: Evolving/moderate complexity  EVALUATION COMPLEXITY: Moderate   GOALS: Goals reviewed with patient? Yes  SHORT TERM GOALS: Target date: 08/07/2023  Pt will be independent with HEP in order to improve strength and decrease back pain to improve pain-free function at home and work. Baseline: 06/26/23: Initial provided  Goal status: INITIAL   LONG TERM GOALS: Target date: 09/18/2023  Pt will report ability to walk her dog around her  neighborhood more than "1 Loop" (~1 mi) without report of additional pain in order to demonstrate reduction in back pain and improvements in muscular endurance   Baseline: 06/26/2023: 1 Lap Goal status: INITIAL  2.  Pt will decrease worst back pain by at least 2 points on the NPRS in order to demonstrate clinically significant reduction in back pain. Baseline: 06/26/2023: 8/10 NRPS  Goal status: INITIAL  3.  Pt will decrease mODI score by at least 13 points in order demonstrate clinically significant reduction in back pain/disability.       Baseline: 14 / 50 = 28.0 % Goal status: INITIAL  4.  Pt will increase 30 second sit to stand test from 13 to 18 repetitions (+5) in order to demonstrate clinically significant improvements in LE strength and endurance.  Baseline: 06/26/2023: 13 Goal status: INITIAL  5.  Pt will improve lateral step down without R hip adduction/IR to demonstrate improved hip stability and motor control for pain free recreational activities.  Baseline: 06/26/2023: Moderate R hip add/IR  Goal status: INITIAL  PLAN: PT FREQUENCY: 1-2x/week  PT DURATION: 12 weeks  PLANNED INTERVENTIONS: Therapeutic exercises, Therapeutic activity, Neuromuscular re-education, Balance training, Gait training, Patient/Family education, Self Care, Joint mobilization, Joint manipulation, Vestibular training, Canalith repositioning, Orthotic/Fit training, DME instructions, Dry Needling, Electrical stimulation, Spinal manipulation, Spinal mobilization, Cryotherapy, Moist heat, Taping, Traction, Ultrasound, Ionotophoresis 4mg /ml Dexamethasone , Manual therapy, and Re-evaluation.  PLAN FOR NEXT SESSION: Review HEP, Initiate Hip and Core Stabilization exercises,    Satira Curet PT, DPT Physical Therapist- [place]  [facility]  06/26/2023, 9:02 AM

## 2023-06-29 ENCOUNTER — Ambulatory Visit: Attending: Internal Medicine

## 2023-06-29 DIAGNOSIS — M25552 Pain in left hip: Secondary | ICD-10-CM | POA: Insufficient documentation

## 2023-06-29 DIAGNOSIS — M5459 Other low back pain: Secondary | ICD-10-CM | POA: Insufficient documentation

## 2023-06-29 DIAGNOSIS — R42 Dizziness and giddiness: Secondary | ICD-10-CM | POA: Insufficient documentation

## 2023-06-29 DIAGNOSIS — M6281 Muscle weakness (generalized): Secondary | ICD-10-CM | POA: Insufficient documentation

## 2023-06-29 NOTE — Therapy (Signed)
 OUTPATIENT PHYSICAL THERAPY THORACOLUMBAR TREATMENT   Patient Name: Alicia Carr MRN: [id number] DOB:06-20-1980, 43 y.o., female Today's Date: 06/29/2023  END OF SESSION:  PT End of Session - 06/29/23 1731     Visit Number 2    Number of Visits 25    Date for PT Re-Evaluation 09/18/23    Authorization Type [employer] 2025  VL:max combined 30 PT/OT/SLP    Authorization - Visit Number 2    Authorization - Number of Visits 30    Progress Note Due on Visit 10    PT Start Time 1731    PT Stop Time 1815    PT Time Calculation (min) 44 min    Activity Tolerance Patient tolerated treatment well    Behavior During Therapy [facility] for tasks assessed/performed             Past Medical History:  Diagnosis Date   Alcoholism (HCC)    Anemia    Early hepatic fibrosis    Family history of ovarian cancer    Family history of prostate cancer    Gallstones    Genetic testing 11/29/2021   History of chicken pox    Hyperpigmentation    Longitudinal melanonychia    Nevus    right dorsal wrist   Positive ANA (antinuclear antibody)    Primary sclerosing cholangitis    Pruritus    Raynaud's syndrome    Shingles 07/18/2020   Vaginal delivery 2003   Past Surgical History:  Procedure Laterality Date   BILATERAL SALPINGECTOMY  12/03/2021   at time of RSO   CESAREAN SECTION  08/08/2006   CHOLECYSTECTOMY  2003   COLONOSCOPY  09/2020   CYSTOSCOPY  10/17/2022   [facility], Microscopic Hematuria, Repeat Micro UA in 1  year.   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   Vitamin D  deficiency  05/10/2023   Hepatic steatosis 08/17/2022   Right ovarian cyst 12/03/2021   Monoallelic mutation of RAD51C gene 11/30/2021   Genetic testing 11/29/2021   Family history of prostate cancer 11/04/2021   Family history of ovarian cancer 11/04/2021   Dermoid cyst of right ovary 10/28/2021   Intolerant of cold 08/02/2021   Menorrhagia 08/02/2021   Folliculitis 06/10/2021   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 cause 06/18/2020    PCP: Alexander Iba, PA  REFERRING PROVIDER: Matt Song, MD  REFERRING DIAG:  M54.50,G89.29 (ICD-10-CM) - Chronic bilateral low back pain without sciatica    RATIONALE FOR EVALUATION AND TREATMENT: Rehabilitation  THERAPY DIAG: Other low back pain  Muscle weakness (generalized)  ONSET DATE: Chronic > 1 yr  FOLLOW-UP APPT SCHEDULED WITH REFERRING PROVIDER: Not addressed    SUBJECTIVE:  SUBJECTIVE STATEMENT:  Intermittent diffuse Low Back Pain   PERTINENT HISTORY:   Pt reports to OPPT with chief concern of chronic ow back pain > 1 year.  She reports that sometimes she will stand up from a chair and she will experience diffuse low back pain. She reports that intense episodes of LBP will include intermittent "shooting pain" deep into her thigh. Patient reports focal weakness in both of her legs secondary to intense episodes. 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 Pain aggravated with transitional movements: getting up from a chair, tying a shoe,  bending to pick up items. She reports prolonged sitting in an uncomfortable surface (harder chair). She denies b/b changes, saddle paresthesia, abdominal pain, chills/fever, night sweats, nausea, vomiting, unrelenting pain.   Imaging (Per Chart Review 05/02/2022):  CLINICAL DATA:  Low back and left hip pain for several weeks, no known injury, initial encounter   EXAM: LUMBAR SPINE - 3 VIEW   COMPARISON:  None Available.   FINDINGS: Five lumbar type vertebral bodies are well visualized. Vertebral body height is well maintained. Minimal osteophytic changes are noted. No anterolisthesis is seen. No soft tissue abnormality is noted.  IMPRESSION: Mild degenerative changes of the lumbar spine.   Electronically Signed   By: Violeta Grey M.D.   On: 05/04/2022 00:20  PAIN:    Pain Intensity: Present: 0/10, Best: 0/10, Worst: 8/10 Pain location: Low Back  Pain Quality: intermittent and shooting   Radiating: Yes  Numbness/Tingling: Yes Focal Weakness: Yes 24-hour pain behavior: Activity Dependent  History of prior back injury, pain, surgery, or therapy: Yes Dominant hand: right Imaging: Yes   PRECAUTIONS: Fall  WEIGHT BEARING RESTRICTIONS: No  FALLS: Has patient fallen in last 6 months? No  Living Environment Lives with: lives with their family Lives in: House/apartment Stairs: No Has following equipment at home: None  Prior level of function: Independent  Hobbies: Reading   Patient Goals: Patient would like to reduce pain    OBJECTIVE:  Patient Surveys  Modified Oswestry 14 / 50 = 28.0 %   Cognition Patient is oriented to person, place, and time.  Recent memory is intact.  Remote memory is intact.  Attention span and concentration are intact.  Expressive speech is intact.  Patient's fund of knowledge is within normal limits for educational level.    Gross Musculoskeletal Assessment Tremor: None Bulk: Normal Tone: Normal  GAIT: Distance walked:  62m Assistive device utilized: None Level of assistance: Complete Independence Comments: WNL   Posture: Lumbar lordosis: WNL Iliac crest height: Equal bilaterally Lumbar lateral shift: Negative  AROM AROM (Normal range in degrees) AROM   Lumbar   Flexion (65) 100%  Extension (30) 100%  Right lateral flexion (25) 100%  Left lateral flexion (25) 100%  Right rotation (30) 100%  Left rotation (30) 100%      Hip Right Left  Flexion (125) WNL WNL  Extension (15)    Abduction (40)    Adduction     Internal Rotation (45) WNL WNL  External Rotation (45) WNL WNL      Knee    Flexion (135)    Extension (0)        Ankle    Dorsiflexion (20)    Plantarflexion (50)    Inversion (35)    Eversion (15)    (* = pain; Blank rows = not tested)  LE MMT: MMT (out of 5) Right  Left*  Hip flexion 4 4  Hip extension    Hip abduction 4- 4-  Hip adduction    Hip internal rotation 4 4-  Hip external rotation 4 4-  Knee flexion 5 5  Knee extension 5 5  Ankle dorsiflexion 5 5  Ankle plantarflexion 5 5  Ankle inversion    Ankle eversion    (* = pain; Blank rows = not tested)  Sensation Grossly intact to light touch throughout bilateral LEs as determined by testing dermatomes L2-S2. Proprioception, stereognosis, and hot/cold testing deferred on this date.  Reflexes R/L Knee Jerk (L3/4): 2+/2+  Ankle Jerk (S1/2): 2+/2+   Muscle Length Hamstrings (90-90 Test): R: Positive for tightness 40 knee ext L: Positive for tightness 55 knee ext  Palpation Location Right Left         Lumbar paraspinals 1 0  Quadratus Lumborum 0 0  Gluteus Maximus 0 0  Gluteus Medius 1 1  Deep hip external rotators 0 1  PSIS 0 0  Fortin's Area (SIJ) 0 0  Greater Trochanter 0 0  (Blank rows = not tested) Graded on 0-4 scale (0 = no pain, 1 = pain, 2 = pain with wincing/grimacing/flinching, 3 = pain with withdrawal, 4 = unwilling to allow palpation)  Passive Accessory Intervertebral Motion Pt  reports reproduction of concordant pain at L4-S1, each segment and doesn't improve with continued CPA.   Special Tests Lumbar Radiculopathy and Discogenic: SLR (SN 92, -LR 0.29): R: Negative L:  Negative Crossed SLR (SP 90): R: Negative L: Negative  Hip: FABER (SN 81): R: Positive for tightness L: Negative FADIR (SN 94): R: Negative L: Positive for concordant pain  Piriformis Syndrome: FAIR Test (SN 88, SP 83):  L: Positive for Pain in hip   FUNCTIONAL TESTS:  30s Sit to Stand Test: 13 Reps  Lateral Step Down: R: Hip IR/ADD, L: Negative  Supine Bridge: B Single Leg Bridge: R/L: Unable to maintain elevated/ neutral pelvis  TODAY'S TREATMENT: DATE: 06/29/2023  Subjective: Pt reports that she would like to review HEP with printout. Additionally, pt able to walk 1 lap with dog but L gluteal pain present. No other questions or concerns.   There.ex:  Supine Bridge  1 x 10   1 x 8  Neutral Curl Up, LLE Straight   3 x 10s, added leg lift (3" in last two sets)   - Pt endorsed neck flexion fatigue   CPA at L4-L5 grade III - 30s/bout -  4 bouts   - Pt endorsed radiating pain into L gluteal   Prone Press Up (Forearm)   1 x 20, PT applied OP at lower lumbar segment in order to facilitate extension   - pt endorsed less ache and no radiating pain into thigh  Yoga Ball Roll out into forward direction for lumbar mobility   10x with deeper ranges of each rep  PT demo with pt return demo:    Seated Piriformis Stretch (L only)    30s x 1 bout, VC for OP at knee and fwd hip hinge    Cat Cow for lumbar mobility    1  x 10, VC for Lumbar ext   - pt reports return of dull ache   Seated Lumbar extensions with PT OP (applied at elbows in order to facilitate end range ext)   1 x 10, dull ache remained, but with direct pressure to spinal segment   Reviewed HEP at end of session, modified stretches in order to facilitate pain  modulation in L thigh.    Therapeutic Activity:   Side  Plank, L side with Knees Bent   3 x 10s    Dead Bug Progression: (for core stabilization and LE strengthening)  Static Hold: 2 x 10s  Alternating LE: 2 x 10 reps VC for tempo, multimodal cues for LE positioning  Lateral Stepping with Green TB   2 x 16'   2 x 16', Pt reported dull ache in lumbar spine   PATIENT EDUCATION:  Education details: POC, HEP, Prognosis  Person educated: Patient Education method: Explanation, Demonstration, and Handouts Education comprehension: verbalized understanding and returned demonstration   HOME EXERCISE PROGRAM:   Access Code: [email or web address] URL: [email or web address] Date: 06/26/2023 Prepared by: Veryl Gottron Calub Tarnow  Exercises - Supine Figure 4 Piriformis Stretch  - 2 x daily - 7 x weekly - 30-60s hold - Supine Piriformis Stretch with Foot on Ground  - 2 x daily - 7 x weekly - 30-60s hold - Seated Hamstring Stretch  - 2 x daily - 7 x weekly - 30-60s hold - Seated Piriformis Stretch  - 2 x daily - 7 x weekly - 30-60s hold - Supine Bridge  - 1 x daily - 7 x weekly - 2-3 sets - 12-15 reps - Sidelying Hip Abduction  - 1 x daily - 7 x weekly - 2-3 sets - 12-15 reps  ASSESSMENT:  CLINICAL IMPRESSION: Patient returns to OPPT for first follow up in management of LBP. PT initiated core stabilization exercises but limited due to reported ache in lumbar spine with referral pattern in to L gluteal region. Concordant pain improved with repeated extensions in prone position. PT reviewed and provided updated HEP with patient. PT plans to continue extension based exercises, improving core stabilization and L gluteal strength. Based on her current impairments pt will continue to benefit from skilled therapy in order to address deficits and improve QoL.   OBJECTIVE IMPAIRMENTS: decreased activity tolerance, decreased endurance, difficulty walking, decreased strength, and pain.   ACTIVITY LIMITATIONS: lifting, bending, sitting, standing, and  transfers  PARTICIPATION LIMITATIONS: community activity and walking pet  PERSONAL FACTORS: Age, Past/current experiences, Profession, and 1 comorbidity: benign joint hypermobility  are also affecting patient's functional outcome.   REHAB POTENTIAL: Good  CLINICAL DECISION MAKING: Evolving/moderate complexity  EVALUATION COMPLEXITY: Moderate   GOALS: Goals reviewed with patient? Yes  SHORT TERM GOALS: Target date: 08/10/2023  Pt will be independent with HEP in order to improve strength and decrease back pain to improve pain-free function at home and work. Baseline: 06/26/23: Initial provided  Goal status: INITIAL   LONG TERM GOALS: Target date: 09/21/2023  Pt will report ability to walk her dog around her neighborhood more than "1 Loop" (~1 mi) without report of additional pain in order to demonstrate reduction in back pain and improvements in muscular endurance   Baseline: 06/26/2023: 1 Lap Goal status: INITIAL  2.  Pt will decrease worst back pain by at least 2 points on the NPRS in order to demonstrate clinically significant reduction in back pain. Baseline: 06/26/2023: 8/10 NRPS  Goal status: INITIAL  3.  Pt will decrease mODI score by at least 13 points in order demonstrate clinically significant reduction in back pain/disability.       Baseline: 14 / 50 = 28.0 % Goal status: INITIAL  4.  Pt will increase 30 second sit to stand test from 13 to 18 repetitions (+5) in order to demonstrate clinically significant improvements in LE strength and endurance.  Baseline: 06/26/2023:  13 Goal status: INITIAL  5.  Pt will improve lateral step down without R hip adduction/IR to demonstrate improved hip stability and motor control for pain free recreational activities.  Baseline: 06/26/2023: Moderate R hip add/IR  Goal status: INITIAL  PLAN: PT FREQUENCY: 1-2x/week  PT DURATION: 12 weeks  PLANNED INTERVENTIONS: Therapeutic exercises, Therapeutic activity, Neuromuscular  re-education, Balance training, Gait training, Patient/Family education, Self Care, Joint mobilization, Joint manipulation, Vestibular training, Canalith repositioning, Orthotic/Fit training, DME instructions, Dry Needling, Electrical stimulation, Spinal manipulation, Spinal mobilization, Cryotherapy, Moist heat, Taping, Traction, Ultrasound, Ionotophoresis 4mg /ml Dexamethasone , Manual therapy, and Re-evaluation.  PLAN FOR NEXT SESSION: Review HEP, Initiate Hip and Core Stabilization exercises,    Satira Curet PT, DPT Physical Therapist- [facility]  [facility]  06/29/2023, 5:32 PM

## 2023-06-30 ENCOUNTER — Ambulatory Visit
Admission: RE | Admit: 2023-06-30 | Discharge: 2023-06-30 | Disposition: A | Discharge status: Home / Self Care | Payer: Managed Care, Other (non HMO) | Source: Ambulatory Visit | Attending: Hematology and Oncology | Admitting: Hematology and Oncology

## 2023-06-30 DIAGNOSIS — Z9189 Other specified personal risk factors, not elsewhere classified: Secondary | ICD-10-CM

## 2023-06-30 DIAGNOSIS — Z1589 Genetic susceptibility to other disease: Secondary | ICD-10-CM

## 2023-06-30 MED ORDER — GADOPICLENOL 0.5 MMOL/ML IV SOLN
7.0000 mL | Freq: Once | INTRAVENOUS | Status: AC | PRN
  Administered 2023-06-30: 7 mL via INTRAVENOUS

## 2023-07-03 ENCOUNTER — Ambulatory Visit

## 2023-07-03 DIAGNOSIS — M5459 Other low back pain: Secondary | ICD-10-CM

## 2023-07-03 DIAGNOSIS — R42 Dizziness and giddiness: Secondary | ICD-10-CM

## 2023-07-03 DIAGNOSIS — M6281 Muscle weakness (generalized): Secondary | ICD-10-CM

## 2023-07-03 DIAGNOSIS — M25552 Pain in left hip: Secondary | ICD-10-CM | POA: Diagnosis present

## 2023-07-03 NOTE — Therapy (Signed)
 OUTPATIENT PHYSICAL THERAPY THORACOLUMBAR TREATMENT   Patient Name: Alicia Carr MRN: [id number] DOB:[birth year], 43 y.o., female Today's Date: 07/03/2023  END OF SESSION:  PT End of Session - 07/03/23 1739     Visit Number 3    Number of Visits 25    Date for PT Re-Evaluation 09/18/23    Authorization Type [employer] 2025  VL:max combined 30 PT/OT/SLP    Authorization - Number of Visits 30    Progress Note Due on Visit 10    PT Start Time 1732    PT Stop Time 1815    PT Time Calculation (min) 43 min    Activity Tolerance Patient tolerated treatment well    Behavior During Therapy [facility] for tasks assessed/performed             Past Medical History:  Diagnosis Date   Alcoholism (HCC)    Anemia    Early hepatic fibrosis    Family history of ovarian cancer    Family history of prostate cancer    Gallstones    Genetic testing 11/29/2021   History of chicken pox    Hyperpigmentation    Longitudinal melanonychia    Nevus    right dorsal wrist   Positive ANA (antinuclear antibody)    Primary sclerosing cholangitis    Pruritus    Raynaud's syndrome    Shingles 07/18/2020   Vaginal delivery 2003   Past Surgical History:  Procedure Laterality Date   BILATERAL SALPINGECTOMY  12/03/2021   at time of RSO   CESAREAN SECTION  08/08/2006   CHOLECYSTECTOMY  2003   COLONOSCOPY  09/2020   CYSTOSCOPY  10/17/2022   [facility], Microscopic Hematuria, Repeat Micro UA in 1  year.   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   Vitamin D  deficiency 05/10/2023   Hepatic steatosis  08/17/2022   Right ovarian cyst 12/03/2021   Monoallelic mutation of RAD51C gene 11/30/2021   Genetic testing 11/29/2021   Family history of prostate cancer 11/04/2021   Family history of ovarian cancer 11/04/2021   Dermoid cyst of right ovary 10/28/2021   Intolerant of cold 08/02/2021   Menorrhagia 08/02/2021   Folliculitis 06/10/2021   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 cause 06/18/2020    PCP: Alexander Iba, PA  REFERRING PROVIDER: Matt Song, MD  REFERRING DIAG:  M54.50,G89.29 (ICD-10-CM) - Chronic bilateral low back pain without sciatica    RATIONALE FOR EVALUATION AND TREATMENT: Rehabilitation  THERAPY DIAG: Other low back pain  Muscle weakness (generalized)  Dizziness and giddiness  ONSET DATE: Chronic > 1 yr  FOLLOW-UP APPT SCHEDULED WITH REFERRING PROVIDER: Not addressed    SUBJECTIVE:  SUBJECTIVE STATEMENT:  Intermittent diffuse Low Back Pain   PERTINENT HISTORY:   Pt reports to OPPT with chief concern of chronic ow back pain > 1 year.  She reports that sometimes she will stand up from a chair and she will experience diffuse low back pain. She reports that intense episodes of LBP will include intermittent "shooting pain" deep into her thigh. Patient reports focal weakness in both of her legs secondary to intense episodes. 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 Pain aggravated with transitional movements: getting up from a chair, tying a shoe, bending  to pick up items. She reports prolonged sitting in an uncomfortable surface (harder chair). She denies b/b changes, saddle paresthesia, abdominal pain, chills/fever, night sweats, nausea, vomiting, unrelenting pain.   Imaging (Per Chart Review 05/02/2022):  CLINICAL DATA:  Low back and left hip pain for several weeks, no known injury, initial encounter   EXAM: LUMBAR SPINE - 3 VIEW   COMPARISON:  None Available.   FINDINGS: Five lumbar type vertebral bodies are well visualized. Vertebral body height is well maintained. Minimal osteophytic changes are noted. No anterolisthesis is seen. No soft tissue abnormality is noted.  IMPRESSION: Mild degenerative changes of the lumbar spine.   Electronically Signed   By: Violeta Grey M.D.   On: 05/04/2022 00:20  PAIN:    Pain Intensity: Present: 0/10, Best: 0/10, Worst: 8/10 Pain location: Low Back  Pain Quality: intermittent and shooting   Radiating: Yes  Numbness/Tingling: Yes Focal Weakness: Yes 24-hour pain behavior: Activity Dependent  History of prior back injury, pain, surgery, or therapy: Yes Dominant hand: right Imaging: Yes   PRECAUTIONS: Fall  WEIGHT BEARING RESTRICTIONS: No  FALLS: Has patient fallen in last 6 months? No  Living Environment Lives with: lives with their family Lives in: House/apartment Stairs: No Has following equipment at home: None  Prior level of function: Independent  Hobbies: Reading   Patient Goals: Patient would like to reduce pain    OBJECTIVE:  Patient Surveys  Modified Oswestry 14 / 50 = 28.0 %   Cognition Patient is oriented to person, place, and time.  Recent memory is intact.  Remote memory is intact.  Attention span and concentration are intact.  Expressive speech is intact.  Patient's fund of knowledge is within normal limits for educational level.    Gross Musculoskeletal Assessment Tremor: None Bulk: Normal Tone: Normal  GAIT: Distance walked: 40m Assistive  device utilized: None Level of assistance: Complete Independence Comments: WNL   Posture: Lumbar lordosis: WNL Iliac crest height: Equal bilaterally Lumbar lateral shift: Negative  AROM AROM (Normal range in degrees) AROM   Lumbar   Flexion (65) 100%  Extension (30) 100%  Right lateral flexion (25) 100%  Left lateral flexion (25) 100%  Right rotation (30) 100%  Left rotation (30) 100%      Hip Right Left  Flexion (125) WNL WNL  Extension (15)    Abduction (40)    Adduction     Internal Rotation (45) WNL WNL  External Rotation (45) WNL WNL      Knee    Flexion (135)    Extension (0)        Ankle    Dorsiflexion (20)    Plantarflexion (50)    Inversion (35)    Eversion (15)    (* = pain; Blank rows = not tested)  LE MMT: MMT (out of 5) Right  Left*  Hip flexion 4 4  Hip extension    Hip abduction 4- 4-  Hip adduction    Hip internal rotation 4 4-  Hip external rotation 4 4-  Knee flexion 5 5  Knee extension 5 5  Ankle dorsiflexion 5 5  Ankle plantarflexion 5 5  Ankle inversion    Ankle eversion    (* = pain; Blank rows = not tested)  Sensation Grossly intact to light touch throughout bilateral LEs as determined by testing dermatomes L2-S2. Proprioception, stereognosis, and hot/cold testing deferred on this date.  Reflexes R/L Knee Jerk (L3/4): 2+/2+  Ankle Jerk (S1/2): 2+/2+   Muscle Length Hamstrings (90-90 Test): R: Positive for tightness 40 knee ext L: Positive for tightness 55 knee ext  Palpation Location Right Left         Lumbar paraspinals 1 0  Quadratus Lumborum 0 0  Gluteus Maximus 0 0  Gluteus Medius 1 1  Deep hip external rotators 0 1  PSIS 0 0  Fortin's Area (SIJ) 0 0  Greater Trochanter 0 0  (Blank rows = not tested) Graded on 0-4 scale (0 = no pain, 1 = pain, 2 = pain with wincing/grimacing/flinching, 3 = pain with withdrawal, 4 = unwilling to allow palpation)  Passive Accessory Intervertebral Motion Pt reports  reproduction of concordant pain at L4-S1, each segment and doesn't improve with continued CPA.   Special Tests Lumbar Radiculopathy and Discogenic: SLR (SN 92, -LR 0.29): R: Negative L:  Negative Crossed SLR (SP 90): R: Negative L: Negative  Hip: FABER (SN 81): R: Positive for tightness L: Negative FADIR (SN 94): R: Negative L: Positive for concordant pain  Piriformis Syndrome: FAIR Test (SN 88, SP 83):  L: Positive for Pain in hip   FUNCTIONAL TESTS:  30s Sit to Stand Test: 13 Reps  Lateral Step Down: R: Hip IR/ADD, L: Negative  Supine Bridge: B Single Leg Bridge: R/L: Unable to maintain elevated/ neutral pelvis  TODAY'S TREATMENT: DATE: 07/03/2023  Subjective: Pt reports additional stiffness in LE and back due to increased activities this past weekend. Unable to perform HEP due to time constraints. No questions or concerns.  There.ex: Nustep - Level 1-3 - 4 min for LE warm up and strength; PT manually adjusted resistance throughout duration.   Supine Bridge   1 x 10  2 x 10 with green TB around knees   VC for hip extension and tempo  Sit To Stand with 2Kg Med Ball in BUE to 90 shoulder flexion  2 x 10  (Seated rest break in between)   Dead Bug Progression: (for core stabilization and LE strengthening)  Static hold with anti-rotation, Blue TB   2 x 10s hold  Alternating LE/UE:  1 x 10 reps, minor pain with last 3 reps  1 x 8  VC for tempo, multimodal cues for LE positioning  Therapeutic Activity:  Slow Alternating Hip Flexion marches, 10# kettlebell in RUE   2 x 10  VC for slowed tempo  Farmer's Carry   2 x 16' with 10# Kettlebell  2 x 16' with 15# DB   1 x 50' with 20# Kettlebell   Pallof Press   2 x 10 with Blue TB   PATIENT EDUCATION:  Education details: POC, HEP, Prognosis  Person educated: Patient Education method: Programmer, multimedia, Demonstration, and Handouts Education comprehension: verbalized understanding and returned demonstration   HOME  EXERCISE PROGRAM:   Access Code: [email or web address] URL: [email or web address] Date: 06/26/2023 Prepared by: Satira Curet  Exercises -  Supine Figure 4 Piriformis Stretch  - 2 x daily - 7 x weekly - 30-60s hold - Supine Piriformis Stretch with Foot on Ground  - 2 x daily - 7 x weekly - 30-60s hold - Seated Hamstring Stretch  - 2 x daily - 7 x weekly - 30-60s hold - Seated Piriformis Stretch  - 2 x daily - 7 x weekly - 30-60s hold - Supine Bridge  - 1 x daily - 7 x weekly - 2-3 sets - 12-15 reps - Sidelying Hip Abduction  - 1 x daily - 7 x weekly - 2-3 sets - 12-15 reps  ASSESSMENT:  CLINICAL IMPRESSION: Patient returns to OPPT for follow up in management of LBP and LE strengthening. Pt tolerated all exercises in today's session without exacerbation of lumbar pain. Minor pain noted with dead bug exercise following increased repetitions. PT plans to continue building core stability for endurance in order to reduce lumbar symptoms. Additional exercises updated with resistance in order to strengthen core and gluteal muscles. PT plans to continue progressing core stabilization and functional strength. Based on her current impairments pt will continue to benefit from skilled PT in order to improve QoL and facilitate return to PLOF.   OBJECTIVE IMPAIRMENTS: decreased activity tolerance, decreased endurance, difficulty walking, decreased strength, and pain.   ACTIVITY LIMITATIONS: lifting, bending, sitting, standing, and transfers  PARTICIPATION LIMITATIONS: community activity and walking pet  PERSONAL FACTORS: Age, Past/current experiences, Profession, and 1 comorbidity: benign joint hypermobility  are also affecting patient's functional outcome.   REHAB POTENTIAL: Good  CLINICAL DECISION MAKING: Evolving/moderate complexity  EVALUATION COMPLEXITY: Moderate   GOALS: Goals reviewed with patient? Yes  SHORT TERM GOALS: Target date: 08/14/2023  Pt will be independent with HEP in  order to improve strength and decrease back pain to improve pain-free function at home and work. Baseline: 06/26/23: Initial provided  Goal status: INITIAL   LONG TERM GOALS: Target date: 09/25/2023  Pt will report ability to walk her dog around her neighborhood more than "1 Loop" (~1 mi) without report of additional pain in order to demonstrate reduction in back pain and improvements in muscular endurance   Baseline: 06/26/2023: 1 Lap Goal status: INITIAL  2.  Pt will decrease worst back pain by at least 2 points on the NPRS in order to demonstrate clinically significant reduction in back pain. Baseline: 06/26/2023: 8/10 NRPS  Goal status: INITIAL  3.  Pt will decrease mODI score by at least 13 points in order demonstrate clinically significant reduction in back pain/disability.       Baseline: 14 / 50 = 28.0 % Goal status: INITIAL  4.  Pt will increase 30 second sit to stand test from 13 to 18 repetitions (+5) in order to demonstrate clinically significant improvements in LE strength and endurance.  Baseline: 06/26/2023: 13 Goal status: INITIAL  5.  Pt will improve lateral step down without R hip adduction/IR to demonstrate improved hip stability and motor control for pain free recreational activities.  Baseline: 06/26/2023: Moderate R hip add/IR  Goal status: INITIAL  PLAN: PT FREQUENCY: 1-2x/week  PT DURATION: 12 weeks  PLANNED INTERVENTIONS: Therapeutic exercises, Therapeutic activity, Neuromuscular re-education, Balance training, Gait training, Patient/Family education, Self Care, Joint mobilization, Joint manipulation, Vestibular training, Canalith repositioning, Orthotic/Fit training, DME instructions, Dry Needling, Electrical stimulation, Spinal manipulation, Spinal mobilization, Cryotherapy, Moist heat, Taping, Traction, Ultrasound, Ionotophoresis 4mg /ml Dexamethasone , Manual therapy, and Re-evaluation.  PLAN FOR NEXT SESSION:  Progress Hip and Core Stabilization exercises,  Satira Curet PT, DPT Physical Therapist- [facility]  07/03/2023, 10:13 PM

## 2023-07-05 ENCOUNTER — Ambulatory Visit

## 2023-07-05 ENCOUNTER — Encounter

## 2023-07-06 ENCOUNTER — Ambulatory Visit: Payer: Self-pay

## 2023-07-06 DIAGNOSIS — M6281 Muscle weakness (generalized): Secondary | ICD-10-CM

## 2023-07-06 DIAGNOSIS — M5459 Other low back pain: Secondary | ICD-10-CM

## 2023-07-06 NOTE — Therapy (Signed)
 OUTPATIENT PHYSICAL THERAPY THORACOLUMBAR TREATMENT   Patient Name: Alicia Carr MRN: [id number] DOB:Jul 28, 1980, 43 y.o., female Today's Date: 07/06/2023  END OF SESSION:  PT End of Session - 07/06/23 0951     Visit Number 4    Number of Visits 25    Date for PT Re-Evaluation 09/18/23    Authorization Type [employer] 2025  VL:max combined 30 PT/OT/SLP    Authorization - Visit Number 4    Authorization - Number of Visits 30    Progress Note Due on Visit 10    PT Start Time 0950    PT Stop Time 1029    PT Time Calculation (min) 39 min    Activity Tolerance Patient tolerated treatment well    Behavior During Therapy [facility] for tasks assessed/performed             Past Medical History:  Diagnosis Date   Alcoholism (HCC)    Anemia    Early hepatic fibrosis    Family history of ovarian cancer    Family history of prostate cancer    Gallstones    Genetic testing 11/29/2021   History of chicken pox    Hyperpigmentation    Longitudinal melanonychia    Nevus    right dorsal wrist   Positive ANA (antinuclear antibody)    Primary sclerosing cholangitis    Pruritus    Raynaud's syndrome    Shingles 07/18/2020   Vaginal delivery 2003   Past Surgical History:  Procedure Laterality Date   BILATERAL SALPINGECTOMY  12/03/2021   at time of RSO   CESAREAN SECTION  08/08/2006   CHOLECYSTECTOMY  2003   COLONOSCOPY  09/2020   CYSTOSCOPY  10/17/2022   [facility], Microscopic Hematuria, Repeat Micro UA in 1  year.   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   Vitamin D  deficiency  05/10/2023   Hepatic steatosis 08/17/2022   Right ovarian cyst 12/03/2021   Monoallelic mutation of RAD51C gene 11/30/2021   Genetic testing 11/29/2021   Family history of prostate cancer 11/04/2021   Family history of ovarian cancer 11/04/2021   Dermoid cyst of right ovary 10/28/2021   Intolerant of cold 08/02/2021   Menorrhagia 08/02/2021   Folliculitis 06/10/2021   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 cause 06/18/2020    PCP: Alexander Iba, PA  REFERRING PROVIDER: Alexander Iba, PA  REFERRING DIAG:  [phone] (ICD-10-CM) - Chronic bilateral low back pain without sciatica    RATIONALE FOR EVALUATION AND TREATMENT: Rehabilitation  THERAPY DIAG: Other low back pain  Muscle weakness (generalized)  ONSET DATE: Chronic > 1 yr  FOLLOW-UP APPT SCHEDULED WITH REFERRING PROVIDER: Not addressed    SUBJECTIVE:  SUBJECTIVE STATEMENT:  Intermittent diffuse Low Back Pain   PERTINENT HISTORY:   Pt reports to OPPT with chief concern of chronic ow back pain > 1 year.  She reports that sometimes she will stand up from a chair and she will experience diffuse low back pain. She reports that intense episodes of LBP will include intermittent "shooting pain" deep into her thigh. Patient reports focal weakness in both of her legs secondary to intense episodes. 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 Pain aggravated with transitional movements: getting up from a chair, tying a shoe,  bending to pick up items. She reports prolonged sitting in an uncomfortable surface (harder chair). She denies b/b changes, saddle paresthesia, abdominal pain, chills/fever, night sweats, nausea, vomiting, unrelenting pain.   Imaging (Per Chart Review 05/02/2022):  CLINICAL DATA:  Low back and left hip pain for several weeks, no known injury, initial encounter   EXAM: LUMBAR SPINE - 3 VIEW   COMPARISON:  None Available.   FINDINGS: Five lumbar type vertebral bodies are well visualized. Vertebral body height is well maintained. Minimal osteophytic changes are noted. No anterolisthesis is seen. No soft tissue abnormality is noted.  IMPRESSION: Mild degenerative changes of the lumbar spine.   Electronically Signed   By: Violeta Grey M.D.   On: 05/04/2022 00:20  PAIN:    Pain Intensity: Present: 0/10, Best: 0/10, Worst: 8/10 Pain location: Low Back  Pain Quality: intermittent and shooting  Radiating: Yes  Numbness/Tingling: Yes Focal Weakness: Yes 24-hour pain behavior: Activity Dependent  History of prior back injury, pain, surgery, or therapy: Yes Dominant hand: right Imaging: Yes   PRECAUTIONS: Fall  WEIGHT BEARING RESTRICTIONS: No  FALLS: Has patient fallen in last 6 months? No  Living Environment Lives with: lives with their family Lives in: House/apartment Stairs: No Has following equipment at home: None  Prior level of function: Independent  Hobbies: Reading   Patient Goals: Patient would like to reduce pain    OBJECTIVE:  Patient Surveys  Modified Oswestry 14 / 50 = 28.0 %   Cognition Patient is oriented to person, place, and time.  Recent memory is intact.  Remote memory is intact.  Attention span and concentration are intact.  Expressive speech is intact.  Patient's fund of knowledge is within normal limits for educational level.    Gross Musculoskeletal Assessment Tremor: None Bulk: Normal Tone: Normal  GAIT: Distance walked:  59m Assistive device utilized: None Level of assistance: Complete Independence Comments: WNL   Posture: Lumbar lordosis: WNL Iliac crest height: Equal bilaterally Lumbar lateral shift: Negative  AROM AROM (Normal range in degrees) AROM   Lumbar   Flexion (65) 100%  Extension (30) 100%  Right lateral flexion (25) 100%  Left lateral flexion (25) 100%  Right rotation (30) 100%  Left rotation (30) 100%      Hip Right Left  Flexion (125) WNL WNL  Extension (15)    Abduction (40)    Adduction     Internal Rotation (45) WNL WNL  External Rotation (45) WNL WNL      Knee    Flexion (135)    Extension (0)        Ankle    Dorsiflexion (20)    Plantarflexion (50)    Inversion (35)    Eversion (15)    (* = pain; Blank rows = not tested)  LE MMT: MMT (out of 5) Right  Left*  Hip flexion 4 4  Hip extension    Hip abduction 4- 4-  Hip adduction    Hip internal rotation 4 4-  Hip external rotation 4 4-  Knee flexion 5 5  Knee extension 5 5  Ankle dorsiflexion 5 5  Ankle plantarflexion 5 5  Ankle inversion    Ankle eversion    (* = pain; Blank rows = not tested)  Sensation Grossly intact to light touch throughout bilateral LEs as determined by testing dermatomes L2-S2. Proprioception, stereognosis, and hot/cold testing deferred on this date.  Reflexes R/L Knee Jerk (L3/4): 2+/2+  Ankle Jerk (S1/2): 2+/2+   Muscle Length Hamstrings (90-90 Test): R: Positive for tightness 40 knee ext L: Positive for tightness 55 knee ext  Palpation Location Right Left         Lumbar paraspinals 1 0  Quadratus Lumborum 0 0  Gluteus Maximus 0 0  Gluteus Medius 1 1  Deep hip external rotators 0 1  PSIS 0 0  Fortin's Area (SIJ) 0 0  Greater Trochanter 0 0  (Blank rows = not tested) Graded on 0-4 scale (0 = no pain, 1 = pain, 2 = pain with wincing/grimacing/flinching, 3 = pain with withdrawal, 4 = unwilling to allow palpation)  Passive Accessory Intervertebral Motion Pt  reports reproduction of concordant pain at L4-S1, each segment and doesn't improve with continued CPA.   Special Tests Lumbar Radiculopathy and Discogenic: SLR (SN 92, -LR 0.29): R: Negative L:  Negative Crossed SLR (SP 90): R: Negative L: Negative  Hip: FABER (SN 81): R: Positive for tightness L: Negative FADIR (SN 94): R: Negative L: Positive for concordant pain  Piriformis Syndrome: FAIR Test (SN 88, SP 83):  L: Positive for Pain in hip   FUNCTIONAL TESTS:  30s Sit to Stand Test: 13 Reps  Lateral Step Down: R: Hip IR/ADD, L: Negative  Supine Bridge: B Single Leg Bridge: R/L: Unable to maintain elevated/ neutral pelvis  TODAY'S TREATMENT: DATE: 07/06/2023  Subjective: Pt reports some alleviation in lower back pain since last appointment. 1/10 soreness in lumbar spine, only notable with direct pressure to lower lumbar segments. No questions or concerns.  There.ex:  Supine Bridge   2 x 10 x 1s   1 x 10 with anti rotation against green TB   Sidelying Clamshell against resistance   R/L: 2 x 12 Green TB  Dead Bug Progression: (for core stabilization and LE strengthening)  Static hold   3 x 10s hold  Alternating Leg Extension:  3 x 10  Pallof Press (OMEGA Cable machine) for core stabilization   1 x 10 5# a  1 x 12 x 2s 7#   Modified [place] Twist (Supported against blue bolster)    1 x 6, moderate pain  2 x 10, improved pain with tempo and technique correction   Therapeutic Activity:  Partial Squats with Green TB below knees for gluteal and core stability  1 x 10   2 x 10 5# DB supported with BUE pressed out  Farmer's Carry (KB in RUE)   2 x 50' with 20# Kettlebell 2 x 50' 20# KB    Seated Lumbar Extension with Self OP at lumbar segment   1 x 15   PT encouraged pt to perform intervention while seated at work in order to reduce pain   PATIENT EDUCATION:  Education details: POC, HEP, Prognosis  Person educated: Patient Education method: Programmer, multimedia,  Demonstration, and Handouts Education comprehension: verbalized understanding and returned demonstration  HOME EXERCISE PROGRAM:   Access Code: [email or web address] URL: [email or web address] Date: 06/26/2023 Prepared by: Veryl Gottron Mulan Adan  Exercises - Supine Figure 4 Piriformis Stretch  - 2 x daily - 7 x weekly - 30-60s hold - Supine Piriformis Stretch with Foot on Ground  - 2 x daily - 7 x weekly - 30-60s hold - Seated Hamstring Stretch  - 2 x daily - 7 x weekly - 30-60s hold - Seated Piriformis Stretch  - 2 x daily - 7 x weekly - 30-60s hold - Supine Bridge  - 1 x daily - 7 x weekly - 2-3 sets - 12-15 reps - Sidelying Hip Abduction  - 1 x daily - 7 x weekly - 2-3 sets - 12-15 reps  ASSESSMENT:  CLINICAL IMPRESSION: Continued PT POC focused on management of LBP. PT focused on progressing core stabilization exercises with good return demonstration from patient. PT emphasized lumbar extension with self OP in order to improve lumbar mobility. Pt endorsed improvements with pain since last session, PT emphasized continued adherence to HEP in order to maintain core stabilization progress. Based on her current impairments pt will continue to benefit from skilled PT in order to improve QoL and facilitate return to PLOF.   OBJECTIVE IMPAIRMENTS: decreased activity tolerance, decreased endurance, difficulty walking, decreased strength, and pain.   ACTIVITY LIMITATIONS: lifting, bending, sitting, standing, and transfers  PARTICIPATION LIMITATIONS: community activity and walking pet  PERSONAL FACTORS: Age, Past/current experiences, Profession, and 1 comorbidity: benign joint hypermobility are also affecting patient's functional outcome.   REHAB POTENTIAL: Good  CLINICAL DECISION MAKING: Evolving/moderate complexity  EVALUATION COMPLEXITY: Moderate   GOALS: Goals reviewed with patient? Yes  SHORT TERM GOALS: Target date: 08/17/2023  Pt will be independent with HEP in order to improve  strength and decrease back pain to improve pain-free function at home and work. Baseline: 06/26/23: Initial provided  Goal status: INITIAL   LONG TERM GOALS: Target date: 09/28/2023  Pt will report ability to walk her dog around her neighborhood more than "1 Loop" (~1 mi) without report of additional pain in order to demonstrate reduction in back pain and improvements in muscular endurance   Baseline: 06/26/2023: 1 Lap Goal status: INITIAL  2.  Pt will decrease worst back pain by at least 2 points on the NPRS in order to demonstrate clinically significant reduction in back pain. Baseline: 06/26/2023: 8/10 NRPS  Goal status: INITIAL  3.  Pt will decrease mODI score by at least 13 points in order demonstrate clinically significant reduction in back pain/disability.       Baseline: 14 / 50 = 28.0 % Goal status: INITIAL  4.  Pt will increase 30 second sit to stand test from 13 to 18 repetitions (+5) in order to demonstrate clinically significant improvements in LE strength and endurance.  Baseline: 06/26/2023: 13 Goal status: INITIAL  5.  Pt will improve lateral step down without R hip adduction/IR to demonstrate improved hip stability and motor control for pain free recreational activities.  Baseline: 06/26/2023: Moderate R hip add/IR  Goal status: INITIAL  PLAN: PT FREQUENCY: 1-2x/week  PT DURATION: 12 weeks  PLANNED INTERVENTIONS: Therapeutic exercises, Therapeutic activity, Neuromuscular re-education, Balance training, Gait training, Patient/Family education, Self Care, Joint mobilization, Joint manipulation, Vestibular training, Canalith repositioning, Orthotic/Fit training, DME instructions, Dry Needling, Electrical stimulation, Spinal manipulation, Spinal mobilization, Cryotherapy, Moist heat, Taping, Traction, Ultrasound, Ionotophoresis 4mg /ml Dexamethasone , Manual therapy, and Re-evaluation.  PLAN FOR NEXT SESSION:  Progress Hip and Core Stabilization exercises,     Veryl Gottron  Solomiya Pascale PT, DPT Physical Therapist- [facility]  07/06/2023, 5:04 PM

## 2023-07-10 ENCOUNTER — Ambulatory Visit

## 2023-07-10 DIAGNOSIS — M5459 Other low back pain: Secondary | ICD-10-CM

## 2023-07-10 DIAGNOSIS — M6281 Muscle weakness (generalized): Secondary | ICD-10-CM

## 2023-07-10 NOTE — Therapy (Signed)
 OUTPATIENT PHYSICAL THERAPY THORACOLUMBAR TREATMENT   Patient Name: Alicia Carr MRN: [id number] DOB:05-01-1980, 43 y.o., female Today's Date: 07/10/2023  END OF SESSION:  PT End of Session - 07/10/23 1730     Visit Number 5    Number of Visits 25    Date for PT Re-Evaluation 09/18/23    Authorization Type [employer] 2025  VL:max combined 30 PT/OT/SLP    Authorization - Visit Number 5    Authorization - Number of Visits 30    Progress Note Due on Visit 10    PT Start Time 1730    PT Stop Time 1813    PT Time Calculation (min) 43 min    Activity Tolerance Patient tolerated treatment well    Behavior During Therapy [facility] for tasks assessed/performed             Past Medical History:  Diagnosis Date   Alcoholism (HCC)    Anemia    Early hepatic fibrosis    Family history of ovarian cancer    Family history of prostate cancer    Gallstones    Genetic testing 11/29/2021   History of chicken pox    Hyperpigmentation    Longitudinal melanonychia    Nevus    right dorsal wrist   Positive ANA (antinuclear antibody)    Primary sclerosing cholangitis    Pruritus    Raynaud's syndrome    Shingles 07/18/2020   Vaginal delivery 2003   Past Surgical History:  Procedure Laterality Date   BILATERAL SALPINGECTOMY  12/03/2021   at time of RSO   CESAREAN SECTION  08/08/2006   CHOLECYSTECTOMY  2003   COLONOSCOPY  09/2020   CYSTOSCOPY  10/17/2022   [facility], Microscopic Hematuria, Repeat Micro UA in 1  year.   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   Vitamin D  deficiency  05/10/2023   Hepatic steatosis 08/17/2022   Right ovarian cyst 12/03/2021   Monoallelic mutation of RAD51C gene 11/30/2021   Genetic testing 11/29/2021   Family history of prostate cancer 11/04/2021   Family history of ovarian cancer 11/04/2021   Dermoid cyst of right ovary 10/28/2021   Intolerant of cold 08/02/2021   Menorrhagia 08/02/2021   Folliculitis 06/10/2021   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 cause 06/18/2020    PCP: Alexander Iba, PA  REFERRING PROVIDER: Matt Song, MD  REFERRING DIAG:  M54.50,G89.29 (ICD-10-CM) - Chronic bilateral low back pain without sciatica    RATIONALE FOR EVALUATION AND TREATMENT: Rehabilitation  THERAPY DIAG: Other low back pain  Muscle weakness (generalized)  ONSET DATE: Chronic > 1 yr  FOLLOW-UP APPT SCHEDULED WITH REFERRING PROVIDER: Not addressed    SUBJECTIVE:  SUBJECTIVE STATEMENT:  Intermittent diffuse Low Back Pain   PERTINENT HISTORY:   Pt reports to OPPT with chief concern of chronic ow back pain > 1 year.  She reports that sometimes she will stand up from a chair and she will experience diffuse low back pain. She reports that intense episodes of LBP will include intermittent "shooting pain" deep into her thigh. Patient reports focal weakness in both of her legs secondary to intense episodes. 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 Pain aggravated with transitional movements: getting up from a chair, tying a shoe,  bending to pick up items. She reports prolonged sitting in an uncomfortable surface (harder chair). She denies b/b changes, saddle paresthesia, abdominal pain, chills/fever, night sweats, nausea, vomiting, unrelenting pain.   Imaging (Per Chart Review 05/02/2022):  CLINICAL DATA:  Low back and left hip pain for several weeks, no known injury, initial encounter   EXAM: LUMBAR SPINE - 3 VIEW   COMPARISON:  None Available.   FINDINGS: Five lumbar type vertebral bodies are well visualized. Vertebral body height is well maintained. Minimal osteophytic changes are noted. No anterolisthesis is seen. No soft tissue abnormality is noted.  IMPRESSION: Mild degenerative changes of the lumbar spine.   Electronically Signed   By: Violeta Grey M.D.   On: 05/04/2022 00:20  PAIN:    Pain Intensity: Present: 0/10, Best: 0/10, Worst: 8/10 Pain location: Low Back  Pain Quality: intermittent and shooting  Radiating: Yes  Numbness/Tingling: Yes Focal Weakness: Yes 24-hour pain behavior: Activity Dependent  History of prior back injury, pain, surgery, or therapy: Yes Dominant hand: right Imaging: Yes   PRECAUTIONS: Fall  WEIGHT BEARING RESTRICTIONS: No  FALLS: Has patient fallen in last 6 months? No  Living Environment Lives with: lives with their family Lives in: House/apartment Stairs: No Has following equipment at home: None  Prior level of function: Independent  Hobbies: Reading   Patient Goals: Patient would like to reduce pain    OBJECTIVE:  Patient Surveys  Modified Oswestry 14 / 50 = 28.0 %   Cognition Patient is oriented to person, place, and time.  Recent memory is intact.  Remote memory is intact.  Attention span and concentration are intact.  Expressive speech is intact.  Patient's fund of knowledge is within normal limits for educational level.    Gross Musculoskeletal Assessment Tremor: None Bulk: Normal Tone: Normal  GAIT: Distance walked:  48m Assistive device utilized: None Level of assistance: Complete Independence Comments: WNL   Posture: Lumbar lordosis: WNL Iliac crest height: Equal bilaterally Lumbar lateral shift: Negative  AROM AROM (Normal range in degrees) AROM   Lumbar   Flexion (65) 100%  Extension (30) 100%  Right lateral flexion (25) 100%  Left lateral flexion (25) 100%  Right rotation (30) 100%  Left rotation (30) 100%      Hip Right Left  Flexion (125) WNL WNL  Extension (15)    Abduction (40)    Adduction     Internal Rotation (45) WNL WNL  External Rotation (45) WNL WNL      Knee    Flexion (135)    Extension (0)        Ankle    Dorsiflexion (20)    Plantarflexion (50)    Inversion (35)    Eversion (15)    (* = pain; Blank rows = not tested)  LE MMT: MMT (out of 5) Right  Left*  Hip flexion 4 4  Hip extension    Hip abduction 4- 4-  Hip adduction    Hip internal rotation 4 4-  Hip external rotation 4 4-  Knee flexion 5 5  Knee extension 5 5  Ankle dorsiflexion 5 5  Ankle plantarflexion 5 5  Ankle inversion    Ankle eversion    (* = pain; Blank rows = not tested)  Sensation Grossly intact to light touch throughout bilateral LEs as determined by testing dermatomes L2-S2. Proprioception, stereognosis, and hot/cold testing deferred on this date.  Reflexes R/L Knee Jerk (L3/4): 2+/2+  Ankle Jerk (S1/2): 2+/2+   Muscle Length Hamstrings (90-90 Test): R: Positive for tightness 40 knee ext L: Positive for tightness 55 knee ext  Palpation Location Right Left         Lumbar paraspinals 1 0  Quadratus Lumborum 0 0  Gluteus Maximus 0 0  Gluteus Medius 1 1  Deep hip external rotators 0 1  PSIS 0 0  Fortin's Area (SIJ) 0 0  Greater Trochanter 0 0  (Blank rows = not tested) Graded on 0-4 scale (0 = no pain, 1 = pain, 2 = pain with wincing/grimacing/flinching, 3 = pain with withdrawal, 4 = unwilling to allow palpation)  Passive Accessory Intervertebral Motion Pt  reports reproduction of concordant pain at L4-S1, each segment and doesn't improve with continued CPA.   Special Tests Lumbar Radiculopathy and Discogenic: SLR (SN 92, -LR 0.29): R: Negative L:  Negative Crossed SLR (SP 90): R: Negative L: Negative  Hip: FABER (SN 81): R: Positive for tightness L: Negative FADIR (SN 94): R: Negative L: Positive for concordant pain  Piriformis Syndrome: FAIR Test (SN 88, SP 83):  L: Positive for Pain in hip   FUNCTIONAL TESTS:  30s Sit to Stand Test: 13 Reps  Lateral Step Down: R: Hip IR/ADD, L: Negative  Supine Bridge: B Single Leg Bridge: R/L: Unable to maintain elevated/ neutral pelvis  TODAY'S TREATMENT: DATE: 07/10/2023  Subjective: Continued report of 1/10 in lumbar spine L3-L4 with direct pressure. Pt believes that pain has improved but has remained the same since last session. No questions or concerns.  Therapeutic Exercise:  Cat Cow for improved lumbar mobility (improved lumbar extension in today's session)   2 x 10, VC for lumbar extension at end range   Seated Lumbar Flexion/Ext for improved lumbar mobility.   1 x 10 ea direction   Supine Bridge   2 x 10   1 x 10, Blue TB for gluteal activation  Dead Bug Progression: (for core stabilization and LE strengthening)  Static hold   3 x 10s hold  Alternating marches against Blue TB   1 x 10 Alternating LE  2 x 8  Hip Matrix:   Hip Abduction 2 x 10 25# R/L    Standing hip extension with slight abd. For global gluteal strengthening   R/L: 2 x 10 ea direction    Therapeutic Activity:  Deep Squats with KB --> [employer] with 20# KB in RUE   2 x Trials; VC for  Partial Squats with blue TB around knees for improved squat mechanics and LE strength   3 x 8, minor L knee pain reported. VC to push against band in order to reduce knee valgus.  Lateral Stepping against resistance  2 x 25', Blue TB at ankles  VC for step length and slight knee flexion. Pt reported minor L knee  pain with prolonged flexion  PATIENT EDUCATION:  Education details: POC, HEP, Prognosis  Person educated: Patient Education method: Explanation, Demonstration, and Handouts Education comprehension: verbalized understanding and returned demonstration   HOME EXERCISE PROGRAM:   Access Code: [email or web address] URL: [email or web address] Date: 07/10/2023 Prepared by: Veryl Gottron Levis Nazir  Exercises - Supine Piriformis Stretch with Foot on Ground  - 2 x daily - 7 x weekly - 30-60s hold - Seated Hamstring Stretch  - 2 x daily - 7 x weekly - 30-60s hold - Seated Piriformis Stretch  - 2 x daily - 7 x weekly - 30-60s hold - Supine Bridge with Resistance Band  - 1 x daily - 7 x weekly - 2-3 sets - 10-12 reps - Supine Dead Bug with Leg Extension  - 1 x daily - 3-4 x weekly - 2-3 sets - 8-10 reps - Side Stepping with Resistance at Ankles  - 1 x daily - 3-4 x weekly - 2-3 sets - 10-12 reps  Access Code: [email or web address] URL: [email or web address] Date: 06/26/2023 Prepared by: Veryl Gottron Chyenne Sobczak  Exercises - Supine Figure 4 Piriformis Stretch  - 2 x daily - 7 x weekly - 30-60s hold - Supine Piriformis Stretch with Foot on Ground  - 2 x daily - 7 x weekly - 30-60s hold - Seated Hamstring Stretch  - 2 x daily - 7 x weekly - 30-60s hold - Seated Piriformis Stretch  - 2 x daily - 7 x weekly - 30-60s hold - Supine Bridge  - 1 x daily - 7 x weekly - 2-3 sets - 12-15 reps - Sidelying Hip Abduction  - 1 x daily - 7 x weekly - 2-3 sets - 12-15 reps  ASSESSMENT:  CLINICAL IMPRESSION: Continued PT POC focused on management of LBP and LE strength. Increased volume and intensity of core stabilization exercises; no report of additional lumbar pain from patient. Updated HEP to include additional glute medius exercises for hip stability. PT plans to increasing core stabilization intensity to patient tolerance. Pt still continues to present with deficits in core stabilization, LE strength and pain along  lumbar spine. Based on her current impairments pt will continue to benefit from skilled PT in order to improve QoL and facilitate return to PLOF.   OBJECTIVE IMPAIRMENTS: decreased activity tolerance, decreased endurance, difficulty walking, decreased strength, and pain.   ACTIVITY LIMITATIONS: lifting, bending, sitting, standing, and transfers  PARTICIPATION LIMITATIONS: community activity and walking pet  PERSONAL FACTORS: Age, Past/current experiences, Profession, and 1 comorbidity: benign joint hypermobility are also affecting patient's functional outcome.   REHAB POTENTIAL: Good  CLINICAL DECISION MAKING: Evolving/moderate complexity  EVALUATION COMPLEXITY: Moderate   GOALS: Goals reviewed with patient? Yes  SHORT TERM GOALS: Target date: 08/21/2023  Pt will be independent with HEP in order to improve strength and decrease back pain to improve pain-free function at home and work. Baseline: 06/26/23: Initial provided  Goal status: INITIAL   LONG TERM GOALS: Target date: 10/02/2023  Pt will report ability to walk her dog around her neighborhood more than "1 Loop" (~1 mi) without report of additional pain in order to demonstrate reduction in back pain and improvements in muscular endurance   Baseline: 06/26/2023: 1 Lap Goal status: INITIAL  2.  Pt will decrease worst back pain by at least 2 points on the NPRS in order to demonstrate clinically significant reduction in back pain. Baseline: 06/26/2023: 8/10 NRPS  Goal status: INITIAL  3.  Pt will decrease mODI score by at least 13 points in order demonstrate clinically significant reduction in back pain/disability.  Baseline: 14 / 50 = 28.0 % Goal status: INITIAL  4.  Pt will increase 30 second sit to stand test from 13 to 18 repetitions (+5) in order to demonstrate clinically significant improvements in LE strength and endurance.  Baseline: 06/26/2023: 13 Goal status: INITIAL  5.  Pt will improve lateral step down  without R hip adduction/IR to demonstrate improved hip stability and motor control for pain free recreational activities.  Baseline: 06/26/2023: Moderate R hip add/IR  Goal status: INITIAL  PLAN: PT FREQUENCY: 1-2x/week  PT DURATION: 12 weeks  PLANNED INTERVENTIONS: Therapeutic exercises, Therapeutic activity, Neuromuscular re-education, Balance training, Gait training, Patient/Family education, Self Care, Joint mobilization, Joint manipulation, Dry Needling, Electrical stimulation, Spinal manipulation, Spinal mobilization, Cryotherapy, Moist heat, Manual therapy, and Re-evaluation.  PLAN FOR NEXT SESSION:  Progress Hip and Core Stabilization exercises. Lumbar mobility (extension based). Functional strengthening.    Satira Curet PT, DPT Physical Therapist- [facility]  07/10/2023, 10:27 PM

## 2023-07-12 ENCOUNTER — Encounter

## 2023-07-13 ENCOUNTER — Ambulatory Visit

## 2023-07-13 DIAGNOSIS — M5459 Other low back pain: Secondary | ICD-10-CM | POA: Diagnosis not present

## 2023-07-13 DIAGNOSIS — M6281 Muscle weakness (generalized): Secondary | ICD-10-CM

## 2023-07-13 NOTE — Therapy (Signed)
 OUTPATIENT PHYSICAL THERAPY THORACOLUMBAR TREATMENT   Patient Name: Alicia Carr MRN: [id number] DOB:[birth year], 43 y.o., female Today's Date: 07/13/2023  END OF SESSION:  PT End of Session - 07/13/23 1303     Visit Number 6    Number of Visits 25    Date for PT Re-Evaluation 09/18/23    Authorization Type [employer] 2025  VL:max combined 30 PT/OT/SLP    Authorization - Number of Visits 30    Progress Note Due on Visit 10    PT Start Time 1303    PT Stop Time 1345    PT Time Calculation (min) 42 min    Activity Tolerance Patient tolerated treatment well    Behavior During Therapy [facility] for tasks assessed/performed              Past Medical History:  Diagnosis Date   Alcoholism (HCC)    Anemia    Early hepatic fibrosis    Family history of ovarian cancer    Family history of prostate cancer    Gallstones    Genetic testing 11/29/2021   History of chicken pox    Hyperpigmentation    Longitudinal melanonychia    Nevus    right dorsal wrist   Positive ANA (antinuclear antibody)    Primary sclerosing cholangitis    Pruritus    Raynaud's syndrome    Shingles 07/18/2020   Vaginal delivery 2003   Past Surgical History:  Procedure Laterality Date   BILATERAL SALPINGECTOMY  12/03/2021   at time of RSO   CESAREAN SECTION  08/08/2006   CHOLECYSTECTOMY  2003   COLONOSCOPY  09/2020   CYSTOSCOPY  10/17/2022   [facility], Microscopic Hematuria, Repeat Micro UA in 1  year.   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   Vitamin D  deficiency 05/10/2023   Hepatic steatosis  08/17/2022   Right ovarian cyst 12/03/2021   Monoallelic mutation of RAD51C gene 11/30/2021   Genetic testing 11/29/2021   Family history of prostate cancer 11/04/2021   Family history of ovarian cancer 11/04/2021   Dermoid cyst of right ovary 10/28/2021   Intolerant of cold 08/02/2021   Menorrhagia 08/02/2021   Folliculitis 06/10/2021   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 cause 06/18/2020    PCP: Alexander Iba, PA  REFERRING PROVIDER: Alexander Iba, PA  REFERRING DIAG:  [id number] (ICD-10-CM) - Chronic bilateral low back pain without sciatica    RATIONALE FOR EVALUATION AND TREATMENT: Rehabilitation  THERAPY DIAG: Other low back pain  Muscle weakness (generalized)  ONSET DATE: Chronic > 1 yr  FOLLOW-UP APPT SCHEDULED WITH REFERRING PROVIDER: Not addressed    SUBJECTIVE:  SUBJECTIVE STATEMENT:  Intermittent diffuse Low Back Pain   PERTINENT HISTORY:   Pt reports to OPPT with chief concern of chronic ow back pain > 1 year.  She reports that sometimes she will stand up from a chair and she will experience diffuse low back pain. She reports that intense episodes of LBP will include intermittent "shooting pain" deep into her thigh. Patient reports focal weakness in both of her legs secondary to intense episodes. 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 Pain aggravated with transitional movements: getting up from a chair, tying a shoe, bending to pick up items. She reports  prolonged sitting in an uncomfortable surface (harder chair). She denies b/b changes, saddle paresthesia, abdominal pain, chills/fever, night sweats, nausea, vomiting, unrelenting pain.   Imaging (Per Chart Review 05/02/2022):  CLINICAL DATA:  Low back and left hip pain for several weeks, no known injury, initial encounter   EXAM: LUMBAR SPINE - 3 VIEW   COMPARISON:  None Available.   FINDINGS: Five lumbar type vertebral bodies are well visualized. Vertebral body height is well maintained. Minimal osteophytic changes are noted. No anterolisthesis is seen. No soft tissue abnormality is noted.  IMPRESSION: Mild degenerative changes of the lumbar spine.   Electronically Signed   By: Violeta Grey M.D.   On: 05/04/2022 00:20  PAIN:    Pain Intensity: Present: 0/10, Best: 0/10, Worst: 8/10 Pain location: Low Back  Pain Quality: intermittent and shooting  Radiating: Yes  Numbness/Tingling: Yes Focal Weakness: Yes 24-hour pain behavior: Activity Dependent  History of prior back injury, pain, surgery, or therapy: Yes Dominant hand: right Imaging: Yes   PRECAUTIONS: Fall  WEIGHT BEARING RESTRICTIONS: No  FALLS: Has patient fallen in last 6 months? No  Living Environment Lives with: lives with their family Lives in: House/apartment Stairs: No Has following equipment at home: None  Prior level of function: Independent  Hobbies: Reading   Patient Goals: Patient would like to reduce pain    OBJECTIVE:  Patient Surveys  Modified Oswestry 14 / 50 = 28.0 %   Cognition Patient is oriented to person, place, and time.  Recent memory is intact.  Remote memory is intact.  Attention span and concentration are intact.  Expressive speech is intact.  Patient's fund of knowledge is within normal limits for educational level.    Gross Musculoskeletal Assessment Tremor: None Bulk: Normal Tone: Normal  GAIT: Distance walked: 39m Assistive device utilized: None Level of  assistance: Complete Independence Comments: WNL   Posture: Lumbar lordosis: WNL Iliac crest height: Equal bilaterally Lumbar lateral shift: Negative  AROM AROM (Normal range in degrees) AROM   Lumbar   Flexion (65) 100%  Extension (30) 100%  Right lateral flexion (25) 100%  Left lateral flexion (25) 100%  Right rotation (30) 100%  Left rotation (30) 100%      Hip Right Left  Flexion (125) WNL WNL  Extension (15)    Abduction (40)    Adduction     Internal Rotation (45) WNL WNL  External Rotation (45) WNL WNL      Knee    Flexion (135)    Extension (0)        Ankle    Dorsiflexion (20)    Plantarflexion (50)    Inversion (35)    Eversion (15)    (* = pain; Blank rows = not tested)  LE MMT: MMT (out of 5) Right  Left*  Hip flexion 4 4  Hip extension    Hip abduction 4- 4-  Hip adduction    Hip internal rotation 4 4-  Hip external rotation 4 4-  Knee flexion 5 5  Knee extension 5 5  Ankle dorsiflexion 5 5  Ankle plantarflexion 5 5  Ankle inversion    Ankle eversion    (* = pain; Blank rows = not tested)  Sensation Grossly intact to light touch throughout bilateral LEs as determined by testing dermatomes L2-S2. Proprioception, stereognosis, and hot/cold testing deferred on this date.  Reflexes R/L Knee Jerk (L3/4): 2+/2+  Ankle Jerk (S1/2): 2+/2+   Muscle Length Hamstrings (90-90 Test): R: Positive for tightness 40 knee ext L: Positive for tightness 55 knee ext  Palpation Location Right Left         Lumbar paraspinals 1 0  Quadratus Lumborum 0 0  Gluteus Maximus 0 0  Gluteus Medius 1 1  Deep hip external rotators 0 1  PSIS 0 0  Fortin's Area (SIJ) 0 0  Greater Trochanter 0 0  (Blank rows = not tested) Graded on 0-4 scale (0 = no pain, 1 = pain, 2 = pain with wincing/grimacing/flinching, 3 = pain with withdrawal, 4 = unwilling to allow palpation)  Passive Accessory Intervertebral Motion Pt reports reproduction of concordant pain at L4-S1,  each segment and doesn't improve with continued CPA.   Special Tests Lumbar Radiculopathy and Discogenic: SLR (SN 92, -LR 0.29): R: Negative L:  Negative Crossed SLR (SP 90): R: Negative L: Negative  Hip: FABER (SN 81): R: Positive for tightness L: Negative FADIR (SN 94): R: Negative L: Positive for concordant pain  Piriformis Syndrome: FAIR Test (SN 88, SP 83):  L: Positive for Pain in hip   FUNCTIONAL TESTS:  30s Sit to Stand Test: 13 Reps  Lateral Step Down: R: Hip IR/ADD, L: Negative  Supine Bridge: B Single Leg Bridge: R/L: Unable to maintain elevated/ neutral pelvis  TODAY'S TREATMENT: DATE: 07/13/2023  Subjective: Patient reports no pain currently in lumbar spine. Everything still the same. Pt reports that some movements that aggravated the back previously have improved. No questions or concerns.  Therapeutic Exercise:  Cat Cow for Lumbar Mobility  2 x 10  Supine Lumbar Trunk Rotation for Lumbar Mobility 2 x 10 ea direction   Supine Bridge for core stabilization and LE strength 2 x 10, Blue TB Around Knees   Bridge with Heels on Red Swiss Ball   1 x 10  1 x 10 x 1s hold      Dead Bug  Alternating LE: 2 x 10   Alternating UE/LE: 2 x 10   - Multimodal cues for coordination and TrA Activation   Hip Matrix Machine for gluteal strengthening   Hip Abduction    R/L: 2 x 10 25#   Pt education on proper TrA bracing throughout the day by utilizing self cues using hands on waist and diaphragmatic breathing.      Therapeutic Activity:    Wall Squat for core stability and LE strength   5 x 10s     Tall Marching with Anti Rotation for LE strength and core stabilization    2 x 10, Green TB   Trunk Rotation against resistance at [employer] for trunk stabilization    2 x 10, 10# L > R direction    Forward Lunge onto 6" step (BUE Support)    1 x 10 ea leg ,VC for foot placement and knee bend.  PATIENT EDU CATION:  Education details: POC, HEP, Prognosis   Person educated: Patient Education method: Explanation, Demonstration, and Handouts Education comprehension: verbalized understanding and returned demonstration   HOME EXERCISE PROGRAM:   Access Code: [email or web address] URL: [email or web address] Date: 07/10/2023 Prepared by: Veryl Gottron Lamae Fosco  Exercises - Supine Piriformis Stretch with Foot on Ground  - 2 x daily - 7 x weekly - 30-60s hold - Seated Hamstring Stretch  - 2 x daily - 7 x weekly - 30-60s hold - Seated Piriformis Stretch  - 2 x daily - 7 x weekly - 30-60s hold - Supine Bridge with Resistance Band  - 1 x daily - 7 x weekly - 2-3 sets - 10-12 reps - Supine Dead Bug with Leg Extension  - 1 x daily - 3-4 x weekly - 2-3 sets - 8-10 reps - Side Stepping with Resistance at Ankles  - 1 x daily - 3-4 x weekly - 2-3 sets - 10-12 reps  Access Code: [email or web address] URL: [email or web address] Date: 06/26/2023 Prepared by: Veryl Gottron Neal Oshea  Exercises - Supine Figure 4 Piriformis Stretch  - 2 x daily - 7 x weekly - 30-60s hold - Supine Piriformis Stretch with Foot on Ground  - 2 x daily - 7 x weekly - 30-60s hold - Seated Hamstring Stretch  - 2 x daily - 7 x weekly - 30-60s hold - Seated Piriformis Stretch  - 2 x daily - 7 x weekly - 30-60s hold - Supine Bridge  - 1 x daily - 7 x weekly - 2-3 sets - 12-15 reps - Sidelying Hip Abduction  - 1 x daily - 7 x weekly - 2-3 sets - 12-15 reps  ASSESSMENT:  CLINICAL IMPRESSION: Continued PT POC focused on improving core stabilization and functional strength. PT increased intensity with core stabilization exercises within patient's tolerance. She still presents with LE weakness with dynamic activities such as forward lunges. PT encouraged adherence to HEP. She still presents with generalized weakness and deficits in core stabilization. Pt will continue to benefit from skilled PT in order to improve QoL and facilitate return to PLOF.   OBJECTIVE IMPAIRMENTS: decreased activity  tolerance, decreased endurance, difficulty walking, decreased strength, and pain.   ACTIVITY LIMITATIONS: lifting, bending, sitting, standing, and transfers  PARTICIPATION LIMITATIONS: community activity and walking pet  PERSONAL FACTORS: Age, Past/current experiences, Profession, and 1 comorbidity: benign joint hypermobility are also affecting patient's functional outcome.   REHAB POTENTIAL: Good  CLINICAL DECISION MAKING: Evolving/moderate complexity  EVALUATION COMPLEXITY: Moderate   GOALS: Goals reviewed with patient? Yes  SHORT TERM GOALS: Target date: 08/24/2023  Pt will be independent with HEP in order to improve strength and decrease back pain to improve pain-free function at home and work. Baseline: 06/26/23: Initial provided  Goal status: INITIAL   LONG TERM GOALS: Target date: 10/05/2023  Pt will report ability to walk her dog around her neighborhood more than "1 Loop" (~1 mi) without report of additional pain in order to demonstrate reduction in back pain and improvements in muscular endurance   Baseline: 06/26/2023: 1 Lap Goal status: INITIAL  2.  Pt will decrease worst back pain by at least 2 points on the NPRS in order to demonstrate clinically significant reduction in back pain. Baseline: 06/26/2023: 8/10 NRPS  Goal status: INITIAL  3.  Pt will decrease mODI score by at least 13 points in order demonstrate clinically significant reduction in back pain/disability.       Baseline: 14 / 50 = 28.0 % Goal status: INITIAL  4.  Pt will increase 30 second sit to stand test from 13 to 18 repetitions (+5) in order to demonstrate clinically significant improvements in LE strength and endurance.  Baseline: 06/26/2023: 13 Goal status: INITIAL  5.  Pt will improve lateral step down without R hip adduction/IR to demonstrate improved hip stability and motor control for pain free recreational activities.  Baseline: 06/26/2023: Moderate R hip add/IR  Goal status:  INITIAL  PLAN: PT FREQUENCY: 1-2x/week  PT DURATION: 12 weeks  PLANNED INTERVENTIONS: Therapeutic exercises, Therapeutic activity, Neuromuscular re-education, Balance training, Gait training, Patient/Family education, Self Care, Joint mobilization, Joint manipulation, Dry Needling, Electrical stimulation, Spinal manipulation, Spinal mobilization, Cryotherapy, Moist heat, Manual therapy, and Re-evaluation.  PLAN FOR NEXT SESSION:  Progress Hip and Core Stabilization exercises. Lumbar mobility (extension based). Functional strengthening.    Satira Curet PT, DPT Physical Therapist- [facility]  07/13/2023, 1:51 PM

## 2023-07-17 ENCOUNTER — Ambulatory Visit

## 2023-07-17 DIAGNOSIS — M5459 Other low back pain: Secondary | ICD-10-CM | POA: Diagnosis not present

## 2023-07-17 DIAGNOSIS — M6281 Muscle weakness (generalized): Secondary | ICD-10-CM

## 2023-07-17 NOTE — Therapy (Signed)
 OUTPATIENT PHYSICAL THERAPY THORACOLUMBAR TREATMENT   Patient Name: Alicia Carr MRN: [id number] DOB:Aug 28, 1980, 43 y.o., female Today's Date: 07/17/2023  END OF SESSION:     Past Medical History:  Diagnosis Date   Alcoholism (HCC)    Anemia    Early hepatic fibrosis    Family history of ovarian cancer    Family history of prostate cancer    Gallstones    Genetic testing 11/29/2021   History of chicken pox    Hyperpigmentation    Longitudinal melanonychia    Nevus    right dorsal wrist   Positive ANA (antinuclear antibody)    Primary sclerosing cholangitis    Pruritus    Raynaud's syndrome    Shingles 07/18/2020   Vaginal delivery 2003   Past Surgical History:  Procedure Laterality Date   BILATERAL SALPINGECTOMY  12/03/2021   at time of RSO   CESAREAN SECTION  08/08/2006   CHOLECYSTECTOMY  2003   COLONOSCOPY  09/2020   CYSTOSCOPY  10/17/2022   [facility], Microscopic Hematuria, Repeat Micro UA in 1  year.   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   Vitamin D  deficiency 05/10/2023   Hepatic steatosis 08/17/2022   Right ovarian cyst 12/03/2021   Monoallelic mutation of RAD51C gene 11/30/2021   Genetic testing 11/29/2021   Family history of prostate cancer 11/04/2021   Family history of ovarian cancer 11/04/2021   Dermoid cyst of right ovary 10/28/2021   Intolerant of cold 08/02/2021   Menorrhagia 08/02/2021   Folliculitis 06/10/2021   Longitudinal melanonychia 06/10/2021   Hyperpigmentation of skin 05/03/2021   Raynaud's syndrome without gangrene  02/04/2021   Positive ANA (antinuclear antibody) 10/06/2020   Arthralgia 10/06/2020   Primary sclerosing cholangitis 09/04/2020   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 cause 06/18/2020    PCP: Alexander Iba, PA  REFERRING PROVIDER: Matt Song, MD  REFERRING DIAG:  M54.50,G89.29 (ICD-10-CM) - Chronic bilateral low back pain without sciatica    RATIONALE FOR EVALUATION AND TREATMENT: Rehabilitation  THERAPY DIAG: No diagnosis found.  ONSET DATE: Chronic > 1 yr  FOLLOW-UP APPT SCHEDULED WITH REFERRING PROVIDER: Not addressed    SUBJECTIVE:  SUBJECTIVE STATEMENT:  Intermittent diffuse Low Back Pain   PERTINENT HISTORY:   Pt reports to OPPT with chief concern of chronic ow back pain > 1 year.  She reports that sometimes she will stand up from a chair and she will experience diffuse low back pain. She reports that intense episodes of LBP will include intermittent "shooting pain" deep into her thigh. Patient reports focal weakness in both of her legs secondary to intense episodes. 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 Pain aggravated with transitional movements: getting up from a chair, tying a shoe, bending to pick up items. She reports prolonged sitting in an uncomfortable surface (harder chair). She denies b/b changes, saddle paresthesia, abdominal pain, chills/fever, night sweats, nausea, vomiting, unrelenting pain.   Imaging (Per Chart Review 05/02/2022):  CLINICAL DATA:  Low back and left hip pain for several weeks, no known injury, initial encounter   EXAM: LUMBAR SPINE - 3 VIEW   COMPARISON:  None Available.   FINDINGS: Five lumbar type vertebral bodies are well visualized. Vertebral body height is well  maintained. Minimal osteophytic changes are noted. No anterolisthesis is seen. No soft tissue abnormality is noted.  IMPRESSION: Mild degenerative changes of the lumbar spine.   Electronically Signed   By: Violeta Grey M.D.   On: 05/04/2022 00:20  PAIN:    Pain Intensity: Present: 0/10, Best: 0/10, Worst: 8/10 Pain location: Low Back  Pain Quality: intermittent and shooting  Radiating: Yes  Numbness/Tingling: Yes Focal Weakness: Yes 24-hour pain behavior: Activity Dependent  History of prior back injury, pain, surgery, or therapy: Yes Dominant hand: right Imaging: Yes   PRECAUTIONS: Fall  WEIGHT BEARING RESTRICTIONS: No  FALLS: Has patient fallen in last 6 months? No  Living Environment Lives with: lives with their family Lives in: House/apartment Stairs: No Has following equipment at home: None  Prior level of function: Independent  Hobbies: Reading   Patient Goals: Patient would like to reduce pain    OBJECTIVE:  Patient Surveys  Modified Oswestry 14 / 50 = 28.0 %   Cognition Patient is oriented to person, place, and time.  Recent memory is intact.  Remote memory is intact.  Attention span and concentration are intact.  Expressive speech is intact.  Patient's fund of knowledge is within normal limits for educational level.    Gross Musculoskeletal Assessment Tremor: None Bulk: Normal Tone: Normal  GAIT: Distance walked: 69m Assistive device utilized: None Level of assistance: Complete Independence Comments: WNL   Posture: Lumbar lordosis: WNL Iliac crest height: Equal bilaterally Lumbar lateral shift: Negative  AROM AROM (Normal range in degrees) AROM   Lumbar   Flexion (65) 100%  Extension (30) 100%  Right lateral flexion (25) 100%  Left lateral flexion (25) 100%  Right rotation (30) 100%  Left rotation (30) 100%      Hip Right Left  Flexion (125) WNL WNL  Extension (15)    Abduction (40)    Adduction     Internal Rotation (45)  WNL WNL  External Rotation (45) WNL WNL      Knee    Flexion (135)    Extension (0)        Ankle    Dorsiflexion (20)    Plantarflexion (50)    Inversion (35)    Eversion (15)    (* = pain; Blank rows = not tested)  LE MMT: MMT (out of 5) Right  Left*  Hip flexion 4 4  Hip extension    Hip abduction 4- 4-  Hip adduction    Hip internal rotation 4 4-  Hip external rotation 4 4-  Knee flexion 5 5  Knee extension 5 5  Ankle dorsiflexion 5 5  Ankle plantarflexion 5 5  Ankle inversion    Ankle eversion    (* = pain; Blank rows = not tested)  Sensation Grossly intact to light touch throughout bilateral LEs as determined by testing dermatomes L2-S2. Proprioception, stereognosis, and hot/cold testing deferred on this date.  Reflexes R/L Knee Jerk (L3/4): 2+/2+  Ankle Jerk (S1/2): 2+/2+   Muscle Length Hamstrings (90-90 Test): R: Positive for tightness 40 knee ext L: Positive for tightness 55 knee ext  Palpation Location Right Left         Lumbar paraspinals 1 0  Quadratus Lumborum 0 0  Gluteus Maximus 0 0  Gluteus Medius 1 1  Deep hip external rotators 0 1  PSIS 0 0  Fortin's Area (SIJ) 0 0  Greater Trochanter 0 0  (Blank rows = not tested) Graded on 0-4 scale (0 = no pain, 1 = pain, 2 = pain with wincing/grimacing/flinching, 3 = pain with withdrawal, 4 = unwilling to allow palpation)  Passive Accessory Intervertebral Motion Pt reports reproduction of concordant pain at L4-S1, each segment and doesn't improve with continued CPA.   Special Tests Lumbar Radiculopathy and Discogenic: SLR (SN 92, -LR 0.29): R: Negative L:  Negative Crossed SLR (SP 90): R: Negative L: Negative  Hip: FABER (SN 81): R: Positive for tightness L: Negative FADIR (SN 94): R: Negative L: Positive for concordant pain  Piriformis Syndrome: FAIR Test (SN 88, SP 83):  L: Positive for Pain in hip   FUNCTIONAL TESTS:  30s Sit to Stand Test: 13 Reps  Lateral Step Down: R: Hip IR/ADD,  L: Negative  Supine Bridge: B Single Leg Bridge: R/L: Unable to maintain elevated/ neutral pelvis  TODAY'S TREATMENT: DATE: 07/13/2023  Subjective: Pt reports pain in Lower back during weekend with increased activities. She tried lumbar extensions and it helped with lumbar pain. Pt reports no exercises due to pain in the R foot while walking the dog. No questions or concerns.  Therapeutic Exercise:     Prone Press Up for Lumbar mobility and pain reduction    1 x 15, PT applied OP at L3-L4     KickStand RDL    R: 2 x 10 x 10# Kettlebell    L: 1 x 10 x 73f# Kettlebell  Hip Matrix Machine for gluteal strengthening   Hip Abduction    R/L: 2 x 12 25#   Supine Bridges with LE extended and toes raised   3 x 10   Dead Bug   2 x 10, UE static hold with Alternating LE only  Bird Dog   Static Hold LUE/RLE   3 x 10s       Therapeutic Activity:    Deep Squats with Kettlebell    1 x 10, 20#    1 x 10, 30#    Lateral Lunges with Anti-Rotation    R/L: 2 x 10 with Blue TB ea leg         PATIENT EDU CATION:  Education details: POC, HEP, Prognosis  Person educated: Patient Education method: Explanation, Demonstration, and Handouts Education comprehension: verbalized understanding and returned demonstration   HOME EXERCISE PROGRAM:   Access Code: [email or web address] URL: [email or web address] Date: 07/10/2023 Prepared by: Satira Curet  Exercises -  Supine Piriformis Stretch with Foot on Ground  - 2 x daily - 7 x weekly - 30-60s hold - Seated Hamstring Stretch  - 2 x daily - 7 x weekly - 30-60s hold - Seated Piriformis Stretch  - 2 x daily - 7 x weekly - 30-60s hold - Supine Bridge with Resistance Band  - 1 x daily - 7 x weekly - 2-3 sets - 10-12 reps - Supine Dead Bug with Leg Extension  - 1 x daily - 3-4 x weekly - 2-3 sets - 8-10 reps - Side Stepping with Resistance at Ankles  - 1 x daily - 3-4 x weekly - 2-3 sets - 10-12 reps  Access Code: [email or web address] URL:  [email or web address] Date: 06/26/2023 Prepared by: Veryl Gottron Kennley Schwandt  Exercises - Supine Figure 4 Piriformis Stretch  - 2 x daily - 7 x weekly - 30-60s hold - Supine Piriformis Stretch with Foot on Ground  - 2 x daily - 7 x weekly - 30-60s hold - Seated Hamstring Stretch  - 2 x daily - 7 x weekly - 30-60s hold - Seated Piriformis Stretch  - 2 x daily - 7 x weekly - 30-60s hold - Supine Bridge  - 1 x daily - 7 x weekly - 2-3 sets - 12-15 reps - Sidelying Hip Abduction  - 1 x daily - 7 x weekly - 2-3 sets - 12-15 reps  ASSESSMENT:  CLINICAL IMPRESSION: Continued PT POC focused on improving core stabilization and functional strength. Patient tolerated increase in intensity with functional exercises and core stabilization. Good carryover from previous sessions; patient able to perform dead bug with alternating LE without pain in low back and without cues from PT. She continues to demonstrate steady improvements in core stabilization and LE strength. Patient still has deficits with LE weakness with dynamic activities such as forward lunges. PT encouraged adherence to HEP. She still presents with generalized weakness and deficits in core stabilization. Pt will continue to benefit from skilled PT in order to improve QoL and facilitate return to PLOF.   OBJECTIVE IMPAIRMENTS: decreased activity tolerance, decreased endurance, difficulty walking, decreased strength, and pain.   ACTIVITY LIMITATIONS: lifting, bending, sitting, standing, and transfers  PARTICIPATION LIMITATIONS: community activity and walking pet  PERSONAL FACTORS: Age, Past/current experiences, Profession, and 1 comorbidity: benign joint hypermobility are also affecting patient's functional outcome.   REHAB POTENTIAL: Good  CLINICAL DECISION MAKING: Evolving/moderate complexity  EVALUATION COMPLEXITY: Moderate   GOALS: Goals reviewed with patient? Yes  SHORT TERM GOALS: Target date: 08/28/2023  Pt will be  independent with HEP in order to improve strength and decrease back pain to improve pain-free function at home and work. Baseline: 06/26/23: Initial provided  Goal status: INITIAL   LONG TERM GOALS: Target date: 10/09/2023  Pt will report ability to walk her dog around her neighborhood more than "1 Loop" (~1 mi) without report of additional pain in order to demonstrate reduction in back pain and improvements in muscular endurance   Baseline: 06/26/2023: 1 Lap Goal status: INITIAL  2.  Pt will decrease worst back pain by at least 2 points on the NPRS in order to demonstrate clinically significant reduction in back pain. Baseline: 06/26/2023: 8/10 NRPS  Goal status: INITIAL  3.  Pt will decrease mODI score by at least 13 points in order demonstrate clinically significant reduction in back pain/disability.       Baseline: 14 / 50 = 28.0 % Goal status: INITIAL  4.  Pt will increase 30 second sit to  stand test from 13 to 18 repetitions (+5) in order to demonstrate clinically significant improvements in LE strength and endurance.  Baseline: 06/26/2023: 13 Goal status: INITIAL  5.  Pt will improve lateral step down without R hip adduction/IR to demonstrate improved hip stability and motor control for pain free recreational activities.  Baseline: 06/26/2023: Moderate R hip add/IR  Goal status: INITIAL  PLAN: PT FREQUENCY: 1-2x/week  PT DURATION: 12 weeks  PLANNED INTERVENTIONS: Therapeutic exercises, Therapeutic activity, Neuromuscular re-education, Balance training, Gait training, Patient/Family education, Self Care, Joint mobilization, Joint manipulation, Dry Needling, Electrical stimulation, Spinal manipulation, Spinal mobilization, Cryotherapy, Moist heat, Manual therapy, and Re-evaluation.  PLAN FOR NEXT SESSION:  Progress Hip and Core Stabilization exercises. Lumbar mobility (extension based). Functional strengthening.    Satira Curet PT, DPT Physical Therapist- [facility]  07/17/2023, 6:19 PM

## 2023-07-19 ENCOUNTER — Encounter

## 2023-07-20 ENCOUNTER — Ambulatory Visit: Payer: Self-pay

## 2023-07-20 DIAGNOSIS — M6281 Muscle weakness (generalized): Secondary | ICD-10-CM

## 2023-07-20 DIAGNOSIS — M5459 Other low back pain: Secondary | ICD-10-CM

## 2023-07-20 DIAGNOSIS — M25552 Pain in left hip: Secondary | ICD-10-CM

## 2023-07-20 NOTE — Therapy (Signed)
 OUTPATIENT PHYSICAL THERAPY THORACOLUMBAR TREATMENT   Patient Name: Alicia Carr MRN: [id number] DOB:January 03, 1981, 43 y.o., female Today's Date: 07/20/2023  END OF SESSION:  PT End of Session - 07/20/23 1301     Visit Number 8    Number of Visits 25    Date for PT Re-Evaluation 09/18/23    Authorization Type [employer] 2025  VL:max combined 30 PT/OT/SLP    Authorization - Number of Visits 30    Progress Note Due on Visit 10    PT Start Time 1300    PT Stop Time 1342    PT Time Calculation (min) 42 min    Activity Tolerance Patient tolerated treatment well    Behavior During Therapy WFL for tasks assessed/performed               Past Medical History:  Diagnosis Date   Alcoholism (HCC)    Anemia    Early hepatic fibrosis    Family history of ovarian cancer    Family history of prostate cancer    Gallstones    Genetic testing 11/29/2021   History of chicken pox    Hyperpigmentation    Longitudinal melanonychia    Nevus    right dorsal wrist   Positive ANA (antinuclear antibody)    Primary sclerosing cholangitis    Pruritus    Raynaud's syndrome    Shingles 07/18/2020   Vaginal delivery 2003   Past Surgical History:  Procedure Laterality Date   BILATERAL SALPINGECTOMY  12/03/2021   at time of RSO   CESAREAN SECTION  08/08/2006   CHOLECYSTECTOMY  2003   COLONOSCOPY  09/2020   CYSTOSCOPY  10/17/2022   [facility], Microscopic Hematuria, Repeat Micro UA in 1  year.   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   Vitamin D  deficiency 05/10/2023   Hepatic  steatosis 08/17/2022   Right ovarian cyst 12/03/2021   Monoallelic mutation of RAD51C gene 11/30/2021   Genetic testing 11/29/2021   Family history of prostate cancer 11/04/2021   Family history of ovarian cancer 11/04/2021   Dermoid cyst of right ovary 10/28/2021   Intolerant of cold 08/02/2021   Menorrhagia 08/02/2021   Folliculitis 06/10/2021   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 cause 06/18/2020    PCP: Alexander Iba, PA  REFERRING PROVIDER: Matt Song, MD  REFERRING DIAG:  M54.50,G89.29 (ICD-10-CM) - Chronic bilateral low back pain without sciatica    RATIONALE FOR EVALUATION AND TREATMENT: Rehabilitation  THERAPY DIAG: Other low back pain  Muscle weakness (generalized)  Pain in left hip  ONSET DATE: Chronic > 1 yr  FOLLOW-UP APPT SCHEDULED WITH REFERRING PROVIDER: Not addressed    SUBJECTIVE:  SUBJECTIVE STATEMENT:  Intermittent diffuse Low Back Pain   PERTINENT HISTORY:   Pt reports to OPPT with chief concern of chronic ow back pain > 1 year.  She reports that sometimes she will stand up from a chair and she will experience diffuse low back pain. She reports that intense episodes of LBP will include intermittent "shooting pain" deep into her thigh. Patient reports focal weakness in both of her legs secondary to intense episodes. 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 Pain aggravated with transitional movements: getting up from a chair, tying a shoe,  bending to pick up items. She reports prolonged sitting in an uncomfortable surface (harder chair). She denies b/b changes, saddle paresthesia, abdominal pain, chills/fever, night sweats, nausea, vomiting, unrelenting pain.   Imaging (Per Chart Review 05/02/2022):  CLINICAL DATA:  Low back and left hip pain for several weeks, no known injury, initial encounter   EXAM: LUMBAR SPINE - 3 VIEW   COMPARISON:  None Available.   FINDINGS: Five lumbar type vertebral bodies are well visualized. Vertebral body height is well maintained. Minimal osteophytic changes are noted. No anterolisthesis is seen. No soft tissue abnormality is noted.  IMPRESSION: Mild degenerative changes of the lumbar spine.   Electronically Signed   By: Violeta Grey M.D.   On: 05/04/2022 00:20  PAIN:    Pain Intensity: Present: 0/10, Best: 0/10, Worst: 8/10 Pain location: Low Back  Pain Quality: intermittent and shooting  Radiating: Yes  Numbness/Tingling: Yes Focal Weakness: Yes 24-hour pain behavior: Activity Dependent  History of prior back injury, pain, surgery, or therapy: Yes Dominant hand: right Imaging: Yes   PRECAUTIONS: Fall  WEIGHT BEARING RESTRICTIONS: No  FALLS: Has patient fallen in last 6 months? No  Living Environment Lives with: lives with their family Lives in: House/apartment Stairs: No Has following equipment at home: None  Prior level of function: Independent  Hobbies: Reading   Patient Goals: Patient would like to reduce pain    OBJECTIVE:  Patient Surveys  Modified Oswestry 14 / 50 = 28.0 %   Cognition Patient is oriented to person, place, and time.  Recent memory is intact.  Remote memory is intact.  Attention span and concentration are intact.  Expressive speech is intact.  Patient's fund of knowledge is within normal limits for educational level.    Gross Musculoskeletal Assessment Tremor: None Bulk: Normal Tone: Normal  GAIT: Distance walked:  7m Assistive device utilized: None Level of assistance: Complete Independence Comments: WNL   Posture: Lumbar lordosis: WNL Iliac crest height: Equal bilaterally Lumbar lateral shift: Negative  AROM AROM (Normal range in degrees) AROM   Lumbar   Flexion (65) 100%  Extension (30) 100%  Right lateral flexion (25) 100%  Left lateral flexion (25) 100%  Right rotation (30) 100%  Left rotation (30) 100%      Hip Right Left  Flexion (125) WNL WNL  Extension (15)    Abduction (40)    Adduction     Internal Rotation (45) WNL WNL  External Rotation (45) WNL WNL      Knee    Flexion (135)    Extension (0)        Ankle    Dorsiflexion (20)    Plantarflexion (50)    Inversion (35)    Eversion (15)    (* = pain; Blank rows = not tested)  LE MMT: MMT (out of 5) Right  Left*  Hip flexion 4 4  Hip extension    Hip abduction 4- 4-  Hip adduction    Hip internal rotation 4 4-  Hip external rotation 4 4-  Knee flexion 5 5  Knee extension 5 5  Ankle dorsiflexion 5 5  Ankle plantarflexion 5 5  Ankle inversion    Ankle eversion    (* = pain; Blank rows = not tested)  Sensation Grossly intact to light touch throughout bilateral LEs as determined by testing dermatomes L2-S2. Proprioception, stereognosis, and hot/cold testing deferred on this date.  Reflexes R/L Knee Jerk (L3/4): 2+/2+  Ankle Jerk (S1/2): 2+/2+   Muscle Length Hamstrings (90-90 Test): R: Positive for tightness 40 knee ext L: Positive for tightness 55 knee ext  Palpation Location Right Left         Lumbar paraspinals 1 0  Quadratus Lumborum 0 0  Gluteus Maximus 0 0  Gluteus Medius 1 1  Deep hip external rotators 0 1  PSIS 0 0  Fortin's Area (SIJ) 0 0  Greater Trochanter 0 0  (Blank rows = not tested) Graded on 0-4 scale (0 = no pain, 1 = pain, 2 = pain with wincing/grimacing/flinching, 3 = pain with withdrawal, 4 = unwilling to allow palpation)  Passive Accessory Intervertebral Motion Pt  reports reproduction of concordant pain at L4-S1, each segment and doesn't improve with continued CPA.   Special Tests Lumbar Radiculopathy and Discogenic: SLR (SN 92, -LR 0.29): R: Negative L:  Negative Crossed SLR (SP 90): R: Negative L: Negative  Hip: FABER (SN 81): R: Positive for tightness L: Negative FADIR (SN 94): R: Negative L: Positive for concordant pain  Piriformis Syndrome: FAIR Test (SN 88, SP 83):  L: Positive for Pain in hip   FUNCTIONAL TESTS:  30s Sit to Stand Test: 13 Reps  Lateral Step Down: R: Hip IR/ADD, L: Negative  Supine Bridge: B Single Leg Bridge: R/L: Unable to maintain elevated/ neutral pelvis  TODAY'S TREATMENT: DATE: 07/20/2023  Subjective: Patient reports no lower back pain or hip pain. She has taken notice to when her pain worsens with increased activity. However patient able to walk the dog longer distance. No questions or concerns.  Therapeutic Exercise:   NuStep x 5 min x Level 7-1 (Seat 7) for LE warm up and strength; PT manually adjusted resistance through bout   .Hip Matrix Machine for gluteal strengthening   Hip Abduction    R/L: 3 x 10 25#    Supine Bridges with LE extended and toes raised   2 x 10, Green TB around knees   1 x 10, Green TB around knees, LE on reverse Bosu  Dead Bug for core strengthening  3 x 10, alternating UE/LE    Pallof Press (Cable Column)   2 x 10 x 5#     Therapeutic Activity:   Sit to stand with forward press with Med Ball    2 x 10, 3 Kg    1 x 10, 2 Kg   Lateral Step Down for increased strengthening and balance of LE    R/L: 2 x 10 BUE Support    TRX Squats in order to improve squatting and lifting mechanics   2 x 8, Green TB around knees        PATIENT EDU CATION:  Education details: POC, HEP, Prognosis  Person educated: Patient Education method: Programmer, multimedia, Demonstration, and Handouts Education comprehension: verbalized understanding and returned demonstration   HOME EXERCISE PROGRAM:    Access Code:  [email or web address] URL: [email or web address] Date: 07/10/2023 Prepared by: Veryl Gottron Demarkus Remmel  Exercises - Supine Piriformis Stretch with Foot on Ground  - 2 x daily - 7 x weekly - 30-60s hold - Seated Hamstring Stretch  - 2 x daily - 7 x weekly - 30-60s hold - Seated Piriformis Stretch  - 2 x daily - 7 x weekly - 30-60s hold - Supine Bridge with Resistance Band  - 1 x daily - 7 x weekly - 2-3 sets - 10-12 reps - Supine Dead Bug with Leg Extension  - 1 x daily - 3-4 x weekly - 2-3 sets - 8-10 reps - Side Stepping with Resistance at Ankles  - 1 x daily - 3-4 x weekly - 2-3 sets - 10-12 reps  Access Code: [email or web address] URL: [email or web address] Date: 06/26/2023 Prepared by: Veryl Gottron Surah Pelley  Exercises - Supine Figure 4 Piriformis Stretch  - 2 x daily - 7 x weekly - 30-60s hold - Supine Piriformis Stretch with Foot on Ground  - 2 x daily - 7 x weekly - 30-60s hold - Seated Hamstring Stretch  - 2 x daily - 7 x weekly - 30-60s hold - Seated Piriformis Stretch  - 2 x daily - 7 x weekly - 30-60s hold - Supine Bridge  - 1 x daily - 7 x weekly - 2-3 sets - 12-15 reps - Sidelying Hip Abduction  - 1 x daily - 7 x weekly - 2-3 sets - 12-15 reps  ASSESSMENT:  CLINICAL IMPRESSION: Continued PT POC focused on improving core stabilization and functional strength. PT regressed compound exercises due to report of pain in the knees following last session. Pt tolerated increase in intensity with core stabilization exercises (added unstable surface). Patient still presents with hip IR/ADD in L leg with lateral step down from step. Current PT plan remains appropriate. Patient still has deficits with LE weakness with dynamic activities such as forward lunges. PT encouraged adherence to HEP. She still presents with generalized weakness and deficits in core stabilization. Pt will continue to benefit from skilled PT in order to improve QoL and facilitate return to PLOF.   OBJECTIVE  IMPAIRMENTS: decreased activity tolerance, decreased endurance, difficulty walking, decreased strength, and pain.   ACTIVITY LIMITATIONS: lifting, bending, sitting, standing, and transfers  PARTICIPATION LIMITATIONS: community activity and walking pet  PERSONAL FACTORS: Age, Past/current experiences, Profession, and 1 comorbidity: benign joint hypermobility are also affecting patient's functional outcome.   REHAB POTENTIAL: Good  CLINICAL DECISION MAKING: Evolving/moderate complexity  EVALUATION COMPLEXITY: Moderate   GOALS: Goals reviewed with patient? Yes  SHORT TERM GOALS: Target date: 08/31/2023  Pt will be independent with HEP in order to improve strength and decrease back pain to improve pain-free function at home and work. Baseline: 06/26/23: Initial provided  Goal status: INITIAL   LONG TERM GOALS: Target date: 10/12/2023  Pt will report ability to walk her dog around her neighborhood more than "1 Loop" (~1 mi) without report of additional pain in order to demonstrate reduction in back pain and improvements in muscular endurance   Baseline: 06/26/2023: 1 Lap; 07/20/2023: 2 Laps  Goal status: Progressing   2.  Pt will decrease worst back pain by at least 2 points on the NPRS in order to demonstrate clinically significant reduction in back pain. Baseline: 06/26/2023: 8/10 NRPS; 07/20/2023: 3/10 NPS Goal status: Progressing  3.  Pt will decrease mODI score by at least 13 points in order demonstrate clinically significant reduction in back pain/disability.       Baseline: 14 /  50 = 28.0 % Goal status: INITIAL  4.  Pt will increase 30 second sit to stand test from 13 to 18 repetitions (+5) in order to demonstrate clinically significant improvements in LE strength and endurance.  Baseline: 06/26/2023: 13 Goal status: INITIAL  5.  Pt will improve lateral step down without R hip adduction/IR to demonstrate improved hip stability and motor control for pain free recreational  activities.  Baseline: 06/26/2023: Moderate R hip add/IR  Goal status: INITIAL  PLAN: PT FREQUENCY: 1-2x/week  PT DURATION: 12 weeks  PLANNED INTERVENTIONS: Therapeutic exercises, Therapeutic activity, Neuromuscular re-education, Balance training, Gait training, Patient/Family education, Self Care, Joint mobilization, Joint manipulation, Dry Needling, Electrical stimulation, Spinal manipulation, Spinal mobilization, Cryotherapy, Moist heat, Manual therapy, and Re-evaluation.  PLAN FOR NEXT SESSION:  Progress Hip and Core Stabilization exercises. Lumbar mobility (extension based). Functional strengthening.    Satira Curet PT, DPT Physical Therapist- [facility]  07/20/2023, 1:45 PM

## 2023-07-21 ENCOUNTER — Telehealth: Payer: Self-pay

## 2023-07-21 ENCOUNTER — Encounter: Payer: Self-pay | Admitting: Physician Assistant

## 2023-07-21 ENCOUNTER — Other Ambulatory Visit: Payer: Self-pay

## 2023-07-21 DIAGNOSIS — D509 Iron deficiency anemia, unspecified: Secondary | ICD-10-CM

## 2023-07-21 NOTE — Telephone Encounter (Signed)
 Called pt and advised referral to hematology and PCP recommendations. Please see Pt call note

## 2023-07-21 NOTE — Telephone Encounter (Signed)
 Copied from CRM [id number]. Topic: Clinical - Medical Advice >> Jul 21, 2023 11:56 AM Alicia Carr wrote: Reason for CRM: pt called to speak with provider, states her endocrinologist advised her she is in need of an iron infusion.  States her iron is extremely low. P;ease call pt back at [phone]  Please see pt msg and advise on recommendations/orders needed

## 2023-07-21 NOTE — Telephone Encounter (Signed)
 Called pt and advised referral and pt preference on location. Advised pt of 7-10 business days to work referral and their office to call to schedule an appointment. Pt verbalized understanding of all suggestions and recommendations

## 2023-07-25 ENCOUNTER — Ambulatory Visit: Payer: Self-pay

## 2023-07-25 DIAGNOSIS — M5459 Other low back pain: Secondary | ICD-10-CM | POA: Diagnosis not present

## 2023-07-25 DIAGNOSIS — M6281 Muscle weakness (generalized): Secondary | ICD-10-CM

## 2023-07-25 DIAGNOSIS — M25552 Pain in left hip: Secondary | ICD-10-CM

## 2023-07-25 NOTE — Therapy (Signed)
 OUTPATIENT PHYSICAL THERAPY THORACOLUMBAR TREATMENT   Patient Name: Alicia Carr MRN: [id number] DOB:02-15-1981, 43 y.o., female Today's Date: 07/25/2023  END OF SESSION:  PT End of Session - 07/25/23 1436     Visit Number 9    Number of Visits 25    Date for PT Re-Evaluation 09/18/23    Authorization Type [employer] 2025  VL:max combined 30 PT/OT/SLP    Authorization - Number of Visits 30    Progress Note Due on Visit 10    PT Start Time 1435    PT Stop Time 1510    PT Time Calculation (min) 35 min    Activity Tolerance Patient tolerated treatment well    Behavior During Therapy [facility] for tasks assessed/performed               Past Medical History:  Diagnosis Date   Alcoholism (HCC)    Anemia    Early hepatic fibrosis    Family history of ovarian cancer    Family history of prostate cancer    Gallstones    Genetic testing 11/29/2021   History of chicken pox    Hyperpigmentation    Longitudinal melanonychia    Nevus    right dorsal wrist   Positive ANA (antinuclear antibody)    Primary sclerosing cholangitis    Pruritus    Raynaud's syndrome    Shingles 07/18/2020   Vaginal delivery 2003   Past Surgical History:  Procedure Laterality Date   BILATERAL SALPINGECTOMY  12/03/2021   at time of RSO   CESAREAN SECTION  08/08/2006   CHOLECYSTECTOMY  2003   COLONOSCOPY  09/2020   CYSTOSCOPY  10/17/2022   [facility], Microscopic Hematuria, Repeat Micro UA in 1  year.   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   Vitamin D  deficiency 05/10/2023   Hepatic  steatosis 08/17/2022   Right ovarian cyst 12/03/2021   Monoallelic mutation of RAD51C gene 11/30/2021   Genetic testing 11/29/2021   Family history of prostate cancer 11/04/2021   Family history of ovarian cancer 11/04/2021   Dermoid cyst of right ovary 10/28/2021   Intolerant of cold 08/02/2021   Menorrhagia 08/02/2021   Folliculitis 06/10/2021   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 cause 06/18/2020    PCP: Alexander Iba, PA  REFERRING PROVIDER: Matt Song, MD  REFERRING DIAG:  M54.50,G89.29 (ICD-10-CM) - Chronic bilateral low back pain without sciatica    RATIONALE FOR EVALUATION AND TREATMENT: Rehabilitation  THERAPY DIAG: Other low back pain  Muscle weakness (generalized)  Pain in left hip  ONSET DATE: Chronic > 1 yr  FOLLOW-UP APPT SCHEDULED WITH REFERRING PROVIDER: Not addressed    SUBJECTIVE:  SUBJECTIVE STATEMENT:  Intermittent diffuse Low Back Pain   PERTINENT HISTORY:   Pt reports to OPPT with chief concern of chronic ow back pain > 1 year.  She reports that sometimes she will stand up from a chair and she will experience diffuse low back pain. She reports that intense episodes of LBP will include intermittent "shooting pain" deep into her thigh. Patient reports focal weakness in both of her legs secondary to intense episodes. 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 Pain aggravated with transitional movements: getting up from a chair, tying a shoe,  bending to pick up items. She reports prolonged sitting in an uncomfortable surface (harder chair). She denies b/b changes, saddle paresthesia, abdominal pain, chills/fever, night sweats, nausea, vomiting, unrelenting pain.   Imaging (Per Chart Review 05/02/2022):  CLINICAL DATA:  Low back and left hip pain for several weeks, no known injury, initial encounter   EXAM: LUMBAR SPINE - 3 VIEW   COMPARISON:  None Available.   FINDINGS: Five lumbar type vertebral bodies are well visualized. Vertebral body height is well maintained. Minimal osteophytic changes are noted. No anterolisthesis is seen. No soft tissue abnormality is noted.  IMPRESSION: Mild degenerative changes of the lumbar spine.   Electronically Signed   By: Violeta Grey M.D.   On: 05/04/2022 00:20  PAIN:    Pain Intensity: Present: 0/10, Best: 0/10, Worst: 8/10 Pain location: Low Back  Pain Quality: intermittent and shooting  Radiating: Yes  Numbness/Tingling: Yes Focal Weakness: Yes 24-hour pain behavior: Activity Dependent  History of prior back injury, pain, surgery, or therapy: Yes Dominant hand: right Imaging: Yes   PRECAUTIONS: Fall  WEIGHT BEARING RESTRICTIONS: No  FALLS: Has patient fallen in last 6 months? No  Living Environment Lives with: lives with their family Lives in: House/apartment Stairs: No Has following equipment at home: None  Prior level of function: Independent  Hobbies: Reading   Patient Goals: Patient would like to reduce pain    OBJECTIVE:  Patient Surveys  Modified Oswestry 14 / 50 = 28.0 %   Cognition Patient is oriented to person, place, and time.  Recent memory is intact.  Remote memory is intact.  Attention span and concentration are intact.  Expressive speech is intact.  Patient's fund of knowledge is within normal limits for educational level.    Gross Musculoskeletal Assessment Tremor: None Bulk: Normal Tone: Normal  GAIT: Distance walked:  13m Assistive device utilized: None Level of assistance: Complete Independence Comments: WNL   Posture: Lumbar lordosis: WNL Iliac crest height: Equal bilaterally Lumbar lateral shift: Negative  AROM AROM (Normal range in degrees) AROM   Lumbar   Flexion (65) 100%  Extension (30) 100%  Right lateral flexion (25) 100%  Left lateral flexion (25) 100%  Right rotation (30) 100%  Left rotation (30) 100%      Hip Right Left  Flexion (125) WNL WNL  Extension (15)    Abduction (40)    Adduction     Internal Rotation (45) WNL WNL  External Rotation (45) WNL WNL      Knee    Flexion (135)    Extension (0)        Ankle    Dorsiflexion (20)    Plantarflexion (50)    Inversion (35)    Eversion (15)    (* = pain; Blank rows = not tested)  LE MMT: MMT (out of 5) Right  Left*  Hip flexion 4 4  Hip extension    Hip abduction 4- 4-  Hip adduction    Hip internal rotation 4 4-  Hip external rotation 4 4-  Knee flexion 5 5  Knee extension 5 5  Ankle dorsiflexion 5 5  Ankle plantarflexion 5 5  Ankle inversion    Ankle eversion    (* = pain; Blank rows = not tested)  Sensation Grossly intact to light touch throughout bilateral LEs as determined by testing dermatomes L2-S2. Proprioception, stereognosis, and hot/cold testing deferred on this date.  Reflexes R/L Knee Jerk (L3/4): 2+/2+  Ankle Jerk (S1/2): 2+/2+   Muscle Length Hamstrings (90-90 Test): R: Positive for tightness 40 knee ext L: Positive for tightness 55 knee ext  Palpation Location Right Left         Lumbar paraspinals 1 0  Quadratus Lumborum 0 0  Gluteus Maximus 0 0  Gluteus Medius 1 1  Deep hip external rotators 0 1  PSIS 0 0  Fortin's Area (SIJ) 0 0  Greater Trochanter 0 0  (Blank rows = not tested) Graded on 0-4 scale (0 = no pain, 1 = pain, 2 = pain with wincing/grimacing/flinching, 3 = pain with withdrawal, 4 = unwilling to allow palpation)  Passive Accessory Intervertebral Motion Pt  reports reproduction of concordant pain at L4-S1, each segment and doesn't improve with continued CPA.   Special Tests Lumbar Radiculopathy and Discogenic: SLR (SN 92, -LR 0.29): R: Negative L:  Negative Crossed SLR (SP 90): R: Negative L: Negative  Hip: FABER (SN 81): R: Positive for tightness L: Negative FADIR (SN 94): R: Negative L: Positive for concordant pain  Piriformis Syndrome: FAIR Test (SN 88, SP 83):  L: Positive for Pain in hip   FUNCTIONAL TESTS:  30s Sit to Stand Test: 13 Reps  Lateral Step Down: R: Hip IR/ADD, L: Negative  Supine Bridge: B Single Leg Bridge: R/L: Unable to maintain elevated/ neutral pelvis  TODAY'S TREATMENT: DATE: 07/25/2023  Subjective: Patient reports that she has continued improvements in pain in her lower back. She reports 1-2/10 NPS in middle lumbar spine. No questions or concerns.  Therapeutic Exercise (focused on lumbar mobility and core stabilization):   Seated Lumbar Flexion with Swiss Ball    2 x 10    Seated Lumbar Extension with Self OP    2 x 10, OP applied at lumbar spine   Supine Bridges with alternating marches   3 x 10   Dead Bug for core strengthening  2 x 10, alternating UE/LE   - PT applied resistance at LE against knee extension   Hip Matrix Machine    Hip Abduction     R/L: 2 x 10 30#    Hip Flexion    R/L:2 x 10 30#    PT demo with pt return demonstration    L Hip Flexor Stretch with RLE on first step     15s/bout x 1 bout in order to improve pain and lumbar mobility   Passive Long Axis Distraction in order to reduce pain in lumbar spine    30s/bout x 2 bout in order to improve pain and lumbar mobility      Therapeutic Activity:   Sit to stand with forward press with Med Ball    2 x 10, 3 Kg    1 x 10, 2 Kg   Lateral Step Down for increased strengthening and balance of LE    R/L: 2 x 10 BUE Support  Multimodal Cue for proper knee biomechanics in LLE    TRX Squats in order to improve squatting and  lifting mechanics   2 x 10    VC for squat depth        PATIENT EDU CATION:  Education details: POC, HEP, Prognosis  Person educated: Patient Education method: Explanation, Demonstration, and Handouts Education comprehension: verbalized understanding and returned demonstration   HOME EXERCISE PROGRAM:   Access Code: [email or web address] URL: [email or web address] Date: 07/10/2023 Prepared by: Veryl Gottron Marcell Pfeifer  Exercises - Supine Piriformis Stretch with Foot on Ground  - 2 x daily - 7 x weekly - 30-60s hold - Seated Hamstring Stretch  - 2 x daily - 7 x weekly - 30-60s hold - Seated Piriformis Stretch  - 2 x daily - 7 x weekly - 30-60s hold - Supine Bridge with Resistance Band  - 1 x daily - 7 x weekly - 2-3 sets - 10-12 reps - Supine Dead Bug with Leg Extension  - 1 x daily - 3-4 x weekly - 2-3 sets - 8-10 reps - Side Stepping with Resistance at Ankles  - 1 x daily - 3-4 x weekly - 2-3 sets - 10-12 reps  Access Code: [email or web address] URL: [email or web address] Date: 06/26/2023 Prepared by: Veryl Gottron Flint Hakeem  Exercises - Supine Figure 4 Piriformis Stretch  - 2 x daily - 7 x weekly - 30-60s hold - Supine Piriformis Stretch with Foot on Ground  - 2 x daily - 7 x weekly - 30-60s hold - Seated Hamstring Stretch  - 2 x daily - 7 x weekly - 30-60s hold - Seated Piriformis Stretch  - 2 x daily - 7 x weekly - 30-60s hold - Supine Bridge  - 1 x daily - 7 x weekly - 2-3 sets - 12-15 reps - Sidelying Hip Abduction  - 1 x daily - 7 x weekly - 2-3 sets - 12-15 reps  ASSESSMENT:  CLINICAL IMPRESSION: Continued PT POC focused on improving core stabilization and functional strength. Patient tolerated increase in resistance with gluteal exercises in today's session. No report of additional lumbar pain with core stabilization exercises. Patient unable to perform dynamic core stabilization exercises with extremities against resistance due to deficits in endurance with core stabilization.  Plan to continue progressing core exercises as tolerated. PT encouraged adherence to HEP. She still presents with generalized weakness and deficits in core stabilization. Pt will continue to benefit from skilled PT in order to improve QoL and facilitate return to PLOF.   OBJECTIVE IMPAIRMENTS: decreased activity tolerance, decreased endurance, difficulty walking, decreased strength, and pain.   ACTIVITY LIMITATIONS: lifting, bending, sitting, standing, and transfers  PARTICIPATION LIMITATIONS: community activity and walking pet  PERSONAL FACTORS: Age, Past/current experiences, Profession, and 1 comorbidity: benign joint hypermobility are also affecting patient's functional outcome.   REHAB POTENTIAL: Good  CLINICAL DECISION MAKING: Evolving/moderate complexity  EVALUATION COMPLEXITY: Moderate   GOALS: Goals reviewed with patient? Yes  SHORT TERM GOALS: Target date: 09/05/2023  Pt will be independent with HEP in order to improve strength and decrease back pain to improve pain-free function at home and work. Baseline: 06/26/23: Initial provided  Goal status: INITIAL   LONG TERM GOALS: Target date: 10/17/2023  Pt will report ability to walk her dog around her neighborhood more than "1 Loop" (~1 mi) without report of additional pain in order to demonstrate reduction in back pain and improvements in muscular endurance   Baseline: 06/26/2023: 1 Lap; 07/20/2023: 2 Laps  Goal status: Progressing   2.  Pt will decrease worst back pain by at least 2 points on the NPRS in order to demonstrate clinically significant reduction in back pain. Baseline: 06/26/2023: 8/10 NRPS; 07/20/2023: 3/10 NPS Goal status: Progressing  3.  Pt will decrease mODI score by at least 13 points in order demonstrate clinically significant reduction in back pain/disability.       Baseline: 14 / 50 = 28.0 % Goal status: INITIAL  4.  Pt will increase 30 second sit to stand test from 13 to 18 repetitions (+5) in order  to demonstrate clinically significant improvements in LE strength and endurance.  Baseline: 06/26/2023: 13 Goal status: INITIAL  5.  Pt will improve lateral step down without R hip adduction/IR to demonstrate improved hip stability and motor control for pain free recreational activities.  Baseline: 06/26/2023: Moderate R hip add/IR  Goal status: INITIAL  PLAN: PT FREQUENCY: 1-2x/week  PT DURATION: 12 weeks  PLANNED INTERVENTIONS: Therapeutic exercises, Therapeutic activity, Neuromuscular re-education, Balance training, Gait training, Patient/Family education, Self Care, Joint mobilization, Joint manipulation, Dry Needling, Electrical stimulation, Spinal manipulation, Spinal mobilization, Cryotherapy, Moist heat, Manual therapy, and Re-evaluation.  PLAN FOR NEXT SESSION:  Progress Hip and Core Stabilization exercises. Lumbar mobility (extension based). Functional strengthening.    Satira Curet PT, DPT Physical Therapist- [facility]  07/25/2023, 2:37 PM

## 2023-07-27 ENCOUNTER — Ambulatory Visit

## 2023-07-27 DIAGNOSIS — M5459 Other low back pain: Secondary | ICD-10-CM | POA: Diagnosis not present

## 2023-07-27 DIAGNOSIS — M6281 Muscle weakness (generalized): Secondary | ICD-10-CM

## 2023-07-27 NOTE — Therapy (Signed)
 OUTPATIENT PHYSICAL THERAPY THORACOLUMBAR TREATMENT/PROGRESS NOTE Dates of reporting period  06/26/2023   to   07/27/2023     Patient Name: Alicia Carr MRN: [id number] DOB:October 13, 1980, 43 y.o., female Today's Date: 07/27/2023  END OF SESSION:  PT End of Session - 07/27/23 1738     Visit Number 10    Number of Visits 25    Date for PT Re-Evaluation 09/18/23    Authorization Type [employer] 2025  VL:max combined 30 PT/OT/SLP    Authorization - Number of Visits 30    Progress Note Due on Visit 10    PT Start Time 1733    PT Stop Time 1815    PT Time Calculation (min) 42 min    Activity Tolerance Patient tolerated treatment well    Behavior During Therapy [facility] for tasks assessed/performed               Past Medical History:  Diagnosis Date   Alcoholism (HCC)    Anemia    Early hepatic fibrosis    Family history of ovarian cancer    Family history of prostate cancer    Gallstones    Genetic testing 11/29/2021   History of chicken pox    Hyperpigmentation    Longitudinal melanonychia    Nevus    right dorsal wrist   Positive ANA (antinuclear antibody)    Primary sclerosing cholangitis    Pruritus    Raynaud's syndrome    Shingles 07/18/2020   Vaginal delivery 2003   Past Surgical History:  Procedure Laterality Date   BILATERAL SALPINGECTOMY  12/03/2021   at time of RSO   CESAREAN SECTION  08/08/2006   CHOLECYSTECTOMY  2003   COLONOSCOPY  09/2020   CYSTOSCOPY  10/17/2022   [facility], Microscopic Hematuria, Repeat Micro UA in 1  year.   LIVER BIOPSY  05/2020   ROBOTIC ASSISTED SALPINGO OOPHERECTOMY Bilateral 12/03/2021   Procedure: XI ROBOTIC ASSISTED BILATERAL SALPINGECTOMY AND RIGHT OOPHORECTOMY;  Surgeon: Kris Pester, MD;  Location: [facility] ORS;  Service: Gynecology;  Laterality: Bilateral;   TUBAL LIGATION  2023   Tubes were actually removed due to cancer  gene mutation   Patient Active Problem List   Diagnosis Date Noted   Benign joint  hypermobility 05/10/2023   Vitamin D  deficiency 05/10/2023   Hepatic steatosis 08/17/2022   Right ovarian cyst 12/03/2021   Monoallelic mutation of RAD51C gene 11/30/2021   Genetic testing 11/29/2021   Family history of prostate cancer 11/04/2021   Family history of ovarian cancer 11/04/2021   Dermoid cyst of right ovary 10/28/2021   Intolerant of cold 08/02/2021   Menorrhagia 08/02/2021   Folliculitis 06/10/2021   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 cause 06/18/2020    PCP: Alexander Iba, PA  REFERRING PROVIDER: Matt Song, MD  REFERRING DIAG:  M54.50,G89.29 (ICD-10-CM) - Chronic bilateral low back pain without sciatica    RATIONALE FOR EVALUATION AND TREATMENT: Rehabilitation  THERAPY DIAG: Other low back pain  Muscle weakness (generalized)  ONSET DATE: Chronic > 1 yr  FOLLOW-UP APPT SCHEDULED WITH REFERRING PROVIDER: Not addressed    SUBJECTIVE:  SUBJECTIVE STATEMENT:  Intermittent diffuse Low Back Pain   PERTINENT HISTORY:   Pt reports to OPPT with chief concern of chronic ow back pain > 1 year.  She reports that sometimes she will stand up from a chair and she will experience diffuse low back pain. She reports that intense episodes of LBP will include intermittent "shooting pain" deep into her thigh. Patient reports focal weakness in both of her legs secondary to intense episodes. 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 Pain aggravated with transitional  movements: getting up from a chair, tying a shoe, bending to pick up items. She reports prolonged sitting in an uncomfortable surface (harder chair). She denies b/b changes, saddle paresthesia, abdominal pain, chills/fever, night sweats, nausea, vomiting, unrelenting pain.   Imaging (Per Chart Review 05/02/2022):  CLINICAL DATA:  Low back and left hip pain for several weeks, no known injury, initial encounter   EXAM: LUMBAR SPINE - 3 VIEW   COMPARISON:  None Available.   FINDINGS: Five lumbar type vertebral bodies are well visualized. Vertebral body height is well maintained. Minimal osteophytic changes are noted. No anterolisthesis is seen. No soft tissue abnormality is noted.  IMPRESSION: Mild degenerative changes of the lumbar spine.   Electronically Signed   By: Violeta Grey M.D.   On: 05/04/2022 00:20  PAIN:    Pain Intensity: Present: 0/10, Best: 0/10, Worst: 8/10 Pain location: Low Back  Pain Quality: intermittent and shooting  Radiating: Yes  Numbness/Tingling: Yes Focal Weakness: Yes 24-hour pain behavior: Activity Dependent  History of prior back injury, pain, surgery, or therapy: Yes Dominant hand: right Imaging: Yes   PRECAUTIONS: Fall  WEIGHT BEARING RESTRICTIONS: No  FALLS: Has patient fallen in last 6 months? No  Living Environment Lives with: lives with their family Lives in: House/apartment Stairs: No Has following equipment at home: None  Prior level of function: Independent  Hobbies: Reading   Patient Goals: Patient would like to reduce pain    OBJECTIVE:  Patient Surveys  Modified Oswestry 14 / 50 = 28.0 %   Cognition Patient is oriented to person, place, and time.  Recent memory is intact.  Remote memory is intact.  Attention span and concentration are intact.  Expressive speech is intact.  Patient's fund of knowledge is within normal limits for educational level.    Gross Musculoskeletal Assessment Tremor: None Bulk:  Normal Tone: Normal  GAIT: Distance walked: 7m Assistive device utilized: None Level of assistance: Complete Independence Comments: WNL   Posture: Lumbar lordosis: WNL Iliac crest height: Equal bilaterally Lumbar lateral shift: Negative  AROM AROM (Normal range in degrees) AROM   Lumbar   Flexion (65) 100%  Extension (30) 100%  Right lateral flexion (25) 100%  Left lateral flexion (25) 100%  Right rotation (30) 100%  Left rotation (30) 100%      Hip Right Left  Flexion (125) WNL WNL  Extension (15)    Abduction (40)    Adduction     Internal Rotation (45) WNL WNL  External Rotation (45) WNL WNL      Knee    Flexion (135)    Extension (0)        Ankle    Dorsiflexion (20)    Plantarflexion (50)    Inversion (35)    Eversion (15)    (* = pain; Blank rows = not tested)  LE MMT: MMT (out of 5) Right  Left*  Hip flexion 4 4  Hip extension    Hip abduction 4- 4-  Hip adduction    Hip internal rotation 4 4-  Hip external rotation 4 4-  Knee flexion 5 5  Knee extension 5 5  Ankle dorsiflexion 5 5  Ankle plantarflexion 5 5  Ankle inversion    Ankle eversion    (* = pain; Blank rows = not tested)  Sensation Grossly intact to light touch throughout bilateral LEs as determined by testing dermatomes L2-S2. Proprioception, stereognosis, and hot/cold testing deferred on this date.  Reflexes R/L Knee Jerk (L3/4): 2+/2+  Ankle Jerk (S1/2): 2+/2+   Muscle Length Hamstrings (90-90 Test): R: Positive for tightness 40 knee ext L: Positive for tightness 55 knee ext  Palpation Location Right Left         Lumbar paraspinals 1 0  Quadratus Lumborum 0 0  Gluteus Maximus 0 0  Gluteus Medius 1 1  Deep hip external rotators 0 1  PSIS 0 0  Fortin's Area (SIJ) 0 0  Greater Trochanter 0 0  (Blank rows = not tested) Graded on 0-4 scale (0 = no pain, 1 = pain, 2 = pain with wincing/grimacing/flinching, 3 = pain with withdrawal, 4 = unwilling to allow  palpation)  Passive Accessory Intervertebral Motion Pt reports reproduction of concordant pain at L4-S1, each segment and doesn't improve with continued CPA.   Special Tests Lumbar Radiculopathy and Discogenic: SLR (SN 92, -LR 0.29): R: Negative L:  Negative Crossed SLR (SP 90): R: Negative L: Negative  Hip: FABER (SN 81): R: Positive for tightness L: Negative FADIR (SN 94): R: Negative L: Positive for concordant pain  Piriformis Syndrome: FAIR Test (SN 88, SP 83):  L: Positive for Pain in hip   FUNCTIONAL TESTS:  30s Sit to Stand Test: 13 Reps  Lateral Step Down: R: Hip IR/ADD, L: Negative  Supine Bridge: B Single Leg Bridge: R/L: Unable to maintain elevated/ neutral pelvis  TODAY'S TREATMENT: DATE: 07/27/2023  Subjective: Patient reports 6/10 NPS in the lower back; diffuse pain radiating along lower back. Yesterday symptoms included radiating pain. No trauma or MOI recalled; sudden pain in the middle of the day.  Reports moderate fatigue prior to PT. No questions or concerns.  Therapeutic Exercise (focused on lumbar mobility and core stabilization):  NuStep 1 x 5 min x UE/LE (Seat 7) for LE warm up, endurance and strength; PT manually adjusted resistance throughout bout. Moist heat applied to lower lumber (pt endorsed improvements in pain)    Lateral Step Down   R/L: 2 x 12; reduced HIP IR/ADD in bilateral legs     Seated Lumbar Extension   1 x 15   1 x 20    Dead bug (alternating LE only)    2 x 10    mODI: (Filled out during breaks)    - 13 / 50 = 26.0 %   Manual Therapy:   Long Axis Traction with LE distraction, belt assisted (R/L)   RLE: 1 min/bout x 4 bouts in order to improve radiating pain and decompress lumbar nerve irritation    LLE: 1 min/bout x 4 bouts     Improvements in pain endorsed while walking around the clinic following traction       PPM (3 min unbilled):  30s STS:   Trial 1: 14 reps  (Seated Rest break)   Trial 2: 17.5  (Seated Rest  Break)      PATIENT EDU CATION:  Education details: POC, HEP,  Prognosis  Person educated: Patient Education method: Explanation, Demonstration, and Handouts Education comprehension: verbalized understanding and returned demonstration   HOME EXERCISE PROGRAM:   Access Code: [email or web address] URL: [email or web address] Date: 07/10/2023 Prepared by: Veryl Gottron Kennedy Bohanon  Exercises - Supine Piriformis Stretch with Foot on Ground  - 2 x daily - 7 x weekly - 30-60s hold - Seated Hamstring Stretch  - 2 x daily - 7 x weekly - 30-60s hold - Seated Piriformis Stretch  - 2 x daily - 7 x weekly - 30-60s hold - Supine Bridge with Resistance Band  - 1 x daily - 7 x weekly - 2-3 sets - 10-12 reps - Supine Dead Bug with Leg Extension  - 1 x daily - 3-4 x weekly - 2-3 sets - 8-10 reps - Side Stepping with Resistance at Ankles  - 1 x daily - 3-4 x weekly - 2-3 sets - 10-12 reps  Access Code: [email or web address] URL: [email or web address] Date: 06/26/2023 Prepared by: Veryl Gottron Serra Younan  Exercises - Supine Figure 4 Piriformis Stretch  - 2 x daily - 7 x weekly - 30-60s hold - Supine Piriformis Stretch with Foot on Ground  - 2 x daily - 7 x weekly - 30-60s hold - Seated Hamstring Stretch  - 2 x daily - 7 x weekly - 30-60s hold - Seated Piriformis Stretch  - 2 x daily - 7 x weekly - 30-60s hold - Supine Bridge  - 1 x daily - 7 x weekly - 2-3 sets - 12-15 reps - Sidelying Hip Abduction  - 1 x daily - 7 x weekly - 2-3 sets - 12-15 reps  ASSESSMENT:  CLINICAL IMPRESSION: Patient arrived for 10th visit warranting reassessment towards goals. Session limited due to report of moderate fatigue and pain along lower back. She has demonstrated improvements LE strength, endurance and pain prior to today's session (see below at goals). Based on her recent NDI score 13 / 50 = 26.0 % (last score 28%) she is trending improvements in functional mobility. Time spent performing manual traction in order to reduce pain  in lower back; pt endorsed improvements following intervention. Current PT POC remains appropriate and PT plans to continue functional strength training and improving core stabilization. She still presents with generalized weakness and deficits in core stabilization. Pt will continue to benefit from skilled PT in order to improve QoL and facilitate return to PLOF.   OBJECTIVE IMPAIRMENTS: decreased activity tolerance, decreased endurance, difficulty walking, decreased strength, and pain.   ACTIVITY LIMITATIONS: lifting, bending, sitting, standing, and transfers  PARTICIPATION LIMITATIONS: community activity and walking pet  PERSONAL FACTORS: Age, Past/current experiences, Profession, and 1 comorbidity: benign joint hypermobility are also affecting patient's functional outcome.   REHAB POTENTIAL: Good  CLINICAL DECISION MAKING: Evolving/moderate complexity  EVALUATION COMPLEXITY: Moderate   GOALS: Goals reviewed with patient? Yes  SHORT TERM GOALS: Target date: 09/07/2023  Pt will be independent with HEP in order to improve strength and decrease back pain to improve pain-free function at home and work. Baseline: 06/26/23: Initial provided  Goal status: INITIAL   LONG TERM GOALS: Target date: 10/19/2023  Pt will report ability to walk her dog around her neighborhood more than "1 Loop" (~1 mi) without report of additional pain in order to demonstrate reduction in back pain and improvements in muscular endurance   Baseline: 06/26/2023: 1 Lap; 07/20/2023: 2 Laps  Goal status: Progressing   2.  Pt will decrease worst back pain by at least 2 points on the NPRS in order to demonstrate  clinically significant reduction in back pain. Baseline: 06/26/2023: 8/10 NRPS; 07/20/2023: 3/10 NPS; 07/27/2023: 6/10 NPS.  Goal status: Progressing  3.  Pt will decrease mODI score by at least 13 points in order demonstrate clinically significant reduction in back pain/disability.       Baseline: 14 / 50 =  28.0 %; 13 / 50 = 26.0 % Goal status: Progressing  4.  Pt will increase 30 second sit to stand test from 13 to 18 repetitions (+5) in order to demonstrate clinically significant improvements in LE strength and endurance.  Baseline: 06/26/2023: 13; 07/27/2023: 17.5 Goal status: Progressing   5.  Pt will improve lateral step down without R hip adduction/IR to demonstrate improved hip stability and motor control for pain free recreational activities.  Baseline: 06/26/2023: Moderate R hip add/IR; 07/27/2023: minor R hip add/ir with increased reps (~8) Goal status: Progressing   PLAN: PT FREQUENCY: 1-2x/week  PT DURATION: 12 weeks  PLANNED INTERVENTIONS: Therapeutic exercises, Therapeutic activity, Neuromuscular re-education, Balance training, Gait training, Patient/Family education, Self Care, Joint mobilization, Joint manipulation, Dry Needling, Electrical stimulation, Spinal manipulation, Spinal mobilization, Cryotherapy, Moist heat, Manual therapy, and Re-evaluation.  PLAN FOR NEXT SESSION:  Progress Hip and Core Stabilization exercises. Lumbar mobility (extension based). Functional strengthening.    Satira Curet PT, DPT Physical Therapist- [facility]  07/27/2023, 5:40 PM

## 2023-07-28 ENCOUNTER — Encounter: Payer: Self-pay | Admitting: *Deleted

## 2023-07-28 ENCOUNTER — Telehealth: Payer: Self-pay | Admitting: *Deleted

## 2023-07-28 NOTE — Telephone Encounter (Signed)
 Received notice from [employer] that the MRI of breast has been denied per need for additional clinical information.  This RN sent communication to pre auth team and leader per above for appeal.  Note MRI is medically indicated due to known genetic mutation.  This RN attempted to contact the patient without making contact.  Note pt has active [employer] - will send this note to her for review of above due to her possibly receiving a bill.

## 2023-07-31 ENCOUNTER — Ambulatory Visit: Attending: Internal Medicine

## 2023-07-31 DIAGNOSIS — M25552 Pain in left hip: Secondary | ICD-10-CM | POA: Insufficient documentation

## 2023-07-31 DIAGNOSIS — M5459 Other low back pain: Secondary | ICD-10-CM | POA: Diagnosis present

## 2023-07-31 DIAGNOSIS — M6281 Muscle weakness (generalized): Secondary | ICD-10-CM | POA: Insufficient documentation

## 2023-07-31 NOTE — Therapy (Signed)
 OUTPATIENT PHYSICAL THERAPY THORACOLUMBAR TREATMENT     Patient Name: Alicia Carr MRN: [id number] DOB:06-20-1980, 43 y.o., female Today's Date: 07/31/2023  END OF SESSION:  PT End of Session - 07/31/23 1738     Visit Number 11    Number of Visits 25    Date for PT Re-Evaluation 09/18/23    Authorization Type [employer] 2025  VL:max combined 30 PT/OT/SLP    Authorization - Number of Visits 30    Progress Note Due on Visit 10    PT Start Time 1738    PT Stop Time 1815    PT Time Calculation (min) 37 min    Activity Tolerance Patient limited by fatigue    Behavior During Therapy [facility] for tasks assessed/performed               Past Medical History:  Diagnosis Date   Alcoholism (HCC)    Anemia    Early hepatic fibrosis    Family history of ovarian cancer    Family history of prostate cancer    Gallstones    Genetic testing 11/29/2021   History of chicken pox    Hyperpigmentation    Longitudinal melanonychia    Nevus    right dorsal wrist   Positive ANA (antinuclear antibody)    Primary sclerosing cholangitis    Pruritus    Raynaud's syndrome    Shingles 07/18/2020   Vaginal delivery 2003   Past Surgical History:  Procedure Laterality Date   BILATERAL SALPINGECTOMY  12/03/2021   at time of RSO   CESAREAN SECTION  08/08/2006   CHOLECYSTECTOMY  2003   COLONOSCOPY  09/2020   CYSTOSCOPY  10/17/2022   [facility], Microscopic Hematuria, Repeat Micro UA in 1  year.   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   Vitamin D  deficiency 05/10/2023   Hepatic steatosis  08/17/2022   Right ovarian cyst 12/03/2021   Monoallelic mutation of RAD51C gene 11/30/2021   Genetic testing 11/29/2021   Family history of prostate cancer 11/04/2021   Family history of ovarian cancer 11/04/2021   Dermoid cyst of right ovary 10/28/2021   Intolerant of cold 08/02/2021   Menorrhagia 08/02/2021   Folliculitis 06/10/2021   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 cause 06/18/2020    PCP: Alexander Iba, PA  REFERRING PROVIDER: Matt Song, MD  REFERRING DIAG:  M54.50,G89.29 (ICD-10-CM) - Chronic bilateral low back pain without sciatica    RATIONALE FOR EVALUATION AND TREATMENT: Rehabilitation  THERAPY DIAG: Other low back pain  Muscle weakness (generalized)  ONSET DATE: Chronic > 1 yr  FOLLOW-UP APPT SCHEDULED WITH REFERRING PROVIDER: Not addressed    SUBJECTIVE:  SUBJECTIVE STATEMENT:  Intermittent diffuse Low Back Pain   PERTINENT HISTORY:   Pt reports to OPPT with chief concern of chronic ow back pain > 1 year.  She reports that sometimes she will stand up from a chair and she will experience diffuse low back pain. She reports that intense episodes of LBP will include intermittent "shooting pain" deep into her thigh. Patient reports focal weakness in both of her legs secondary to intense episodes. 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 Pain aggravated with transitional movements: getting up from a chair, tying a shoe, bending to pick up items. She  reports prolonged sitting in an uncomfortable surface (harder chair). She denies b/b changes, saddle paresthesia, abdominal pain, chills/fever, night sweats, nausea, vomiting, unrelenting pain.   Imaging (Per Chart Review 05/02/2022):  CLINICAL DATA:  Low back and left hip pain for several weeks, no known injury, initial encounter   EXAM: LUMBAR SPINE - 3 VIEW   COMPARISON:  None Available.   FINDINGS: Five lumbar type vertebral bodies are well visualized. Vertebral body height is well maintained. Minimal osteophytic changes are noted. No anterolisthesis is seen. No soft tissue abnormality is noted.  IMPRESSION: Mild degenerative changes of the lumbar spine.   Electronically Signed   By: Violeta Grey M.D.   On: 05/04/2022 00:20  PAIN:    Pain Intensity: Present: 0/10, Best: 0/10, Worst: 8/10 Pain location: Low Back  Pain Quality: intermittent and shooting  Radiating: Yes  Numbness/Tingling: Yes Focal Weakness: Yes 24-hour pain behavior: Activity Dependent  History of prior back injury, pain, surgery, or therapy: Yes Dominant hand: right Imaging: Yes   PRECAUTIONS: Fall  WEIGHT BEARING RESTRICTIONS: No  FALLS: Has patient fallen in last 6 months? No  Living Environment Lives with: lives with their family Lives in: House/apartment Stairs: No Has following equipment at home: None  Prior level of function: Independent  Hobbies: Reading   Patient Goals: Patient would like to reduce pain    OBJECTIVE:  Patient Surveys  Modified Oswestry 14 / 50 = 28.0 %   Cognition Patient is oriented to person, place, and time.  Recent memory is intact.  Remote memory is intact.  Attention span and concentration are intact.  Expressive speech is intact.  Patient's fund of knowledge is within normal limits for educational level.    Gross Musculoskeletal Assessment Tremor: None Bulk: Normal Tone: Normal  GAIT: Distance walked: 26m Assistive device utilized:  None Level of assistance: Complete Independence Comments: WNL   Posture: Lumbar lordosis: WNL Iliac crest height: Equal bilaterally Lumbar lateral shift: Negative  AROM AROM (Normal range in degrees) AROM   Lumbar   Flexion (65) 100%  Extension (30) 100%  Right lateral flexion (25) 100%  Left lateral flexion (25) 100%  Right rotation (30) 100%  Left rotation (30) 100%      Hip Right Left  Flexion (125) WNL WNL  Extension (15)    Abduction (40)    Adduction     Internal Rotation (45) WNL WNL  External Rotation (45) WNL WNL      Knee    Flexion (135)    Extension (0)        Ankle    Dorsiflexion (20)    Plantarflexion (50)    Inversion (35)    Eversion (15)    (* = pain; Blank rows = not tested)  LE MMT: MMT (out of 5) Right  Left*  Hip flexion 4 4  Hip extension    Hip abduction 4- 4-  Hip adduction    Hip internal rotation 4 4-  Hip external rotation 4 4-  Knee flexion 5 5  Knee extension 5 5  Ankle dorsiflexion 5 5  Ankle plantarflexion 5 5  Ankle inversion    Ankle eversion    (* = pain; Blank rows = not tested)  Sensation Grossly intact to light touch throughout bilateral LEs as determined by testing dermatomes L2-S2. Proprioception, stereognosis, and hot/cold testing deferred on this date.  Reflexes R/L Knee Jerk (L3/4): 2+/2+  Ankle Jerk (S1/2): 2+/2+   Muscle Length Hamstrings (90-90 Test): R: Positive for tightness 40 knee ext L: Positive for tightness 55 knee ext  Palpation Location Right Left         Lumbar paraspinals 1 0  Quadratus Lumborum 0 0  Gluteus Maximus 0 0  Gluteus Medius 1 1  Deep hip external rotators 0 1  PSIS 0 0  Fortin's Area (SIJ) 0 0  Greater Trochanter 0 0  (Blank rows = not tested) Graded on 0-4 scale (0 = no pain, 1 = pain, 2 = pain with wincing/grimacing/flinching, 3 = pain with withdrawal, 4 = unwilling to allow palpation)  Passive Accessory Intervertebral Motion Pt reports reproduction of concordant  pain at L4-S1, each segment and doesn't improve with continued CPA.   Special Tests Lumbar Radiculopathy and Discogenic: SLR (SN 92, -LR 0.29): R: Negative L:  Negative Crossed SLR (SP 90): R: Negative L: Negative  Hip: FABER (SN 81): R: Positive for tightness L: Negative FADIR (SN 94): R: Negative L: Positive for concordant pain  Piriformis Syndrome: FAIR Test (SN 88, SP 83):  L: Positive for Pain in hip   FUNCTIONAL TESTS:  30s Sit to Stand Test: 13 Reps  Lateral Step Down: R: Hip IR/ADD, L: Negative  Supine Bridge: B Single Leg Bridge: R/L: Unable to maintain elevated/ neutral pelvis  TODAY'S TREATMENT: DATE: 06/02 /2025  Subjective: Patient reports 3-4/10 NPS in the lower back; still radiating pain noted throughout posterior thigh into her R foot.  No trauma or MOI recalled. Improved energy since last visit. No questions or concerns.  Therapeutic Exercise (focused on lumbar mobility and core stabilization):    Supine Bridge   2 x 10    2 x 10, Blue TB around Knee    Dead Bug Progression   3 x 10s hold press against green swiss ball       2 x 10, Alternating LE   L Sidelying, RLE Clamshell    3 x 10,    Multimodal cues for proper sidelying position     Hip Matrix:   Hip Abduction     R/L: 1 x 10 25#   - pt endorsed returned pain in lower back and posterior thigh      Manual Therapy:  Passive Supine Sciatic Nerve Glide  2 min  Supine Sciatic Nerve Mobilization with Strap (PT assisted Knee Extension)   2 min   Passive Straight Leg Raise Sciatic Nerve Flossing   2 min   Manual Crossover Lumbar stretch   5 min in order to improve pain and tissue extensibility  Passive Long Axis Distraction in order to reduce pain in lumbar spine             RLE: 30s/bout x 3   PT education on proper technique on Sciatic Nerve Glides/mobilization techniques  - Added to HEP    PATIENT  EDU CATION:  Education details: POC, HEP, Prognosis  Person educated:  Patient Education method: Explanation, Demonstration, and Handouts Education comprehension: verbalized understanding and returned demonstration   HOME EXERCISE PROGRAM:   Access Code: [email or web address] URL: [email or web address] Date: 07/10/2023 Prepared by: Veryl Gottron Zeshan Sena  Exercises - Supine Piriformis Stretch with Foot on Ground  - 2 x daily - 7 x weekly - 30-60s hold - Seated Hamstring Stretch  - 2 x daily - 7 x weekly - 30-60s hold - Seated Piriformis Stretch  - 2 x daily - 7 x weekly - 30-60s hold - Supine Bridge with Resistance Band  - 1 x daily - 7 x weekly - 2-3 sets - 10-12 reps - Supine Dead Bug with Leg Extension  - 1 x daily - 3-4 x weekly - 2-3 sets - 8-10 reps - Side Stepping with Resistance at Ankles  - 1 x daily - 3-4 x weekly - 2-3 sets - 10-12 reps  Access Code: [email or web address] URL: [email or web address] Date: 06/26/2023 Prepared by: Veryl Gottron Anjanae Woehrle  Exercises - Supine Figure 4 Piriformis Stretch  - 2 x daily - 7 x weekly - 30-60s hold - Supine Piriformis Stretch with Foot on Ground  - 2 x daily - 7 x weekly - 30-60s hold - Seated Hamstring Stretch  - 2 x daily - 7 x weekly - 30-60s hold - Seated Piriformis Stretch  - 2 x daily - 7 x weekly - 30-60s hold - Supine Bridge  - 1 x daily - 7 x weekly - 2-3 sets - 12-15 reps - Sidelying Hip Abduction  - 1 x daily - 7 x weekly - 2-3 sets - 12-15 reps  ASSESSMENT:  CLINICAL IMPRESSION: Patient arrived to OPPT with a main focus on core stabilization lower back pain. Manual techniques applied throughout majority of the session due to lower back pain with radiating symptoms. Pain improved following manual techniques but exacerbated following single leg stance with resisted hip abduction. PT repeated manual techniques and educated patient on self techniques; minor relief in lower back following crossover gluteal stretch and sciatic nerve mobilizations. She still presents with generalized weakness and deficits in  core stabilization. Pt will continue to benefit from skilled PT in order to improve QoL and facilitate return to PLOF   OBJECTIVE IMPAIRMENTS: decreased activity tolerance, decreased endurance, difficulty walking, decreased strength, and pain.   ACTIVITY LIMITATIONS: lifting, bending, sitting, standing, and transfers  PARTICIPATION LIMITATIONS: community activity and walking pet  PERSONAL FACTORS: Age, Past/current experiences, Profession, and 1 comorbidity: benign joint hypermobility are also affecting patient's functional outcome.   REHAB POTENTIAL: Good  CLINICAL DECISION MAKING: Evolving/moderate complexity  EVALUATION COMPLEXITY: Moderate   GOALS: Goals reviewed with patient? Yes  SHORT TERM GOALS: Target date: 09/11/2023  Pt will be independent with HEP in order to improve strength and decrease back pain to improve pain-free function at home and work. Baseline: 06/26/23: Initial provided  Goal status: INITIAL   LONG TERM GOALS: Target date: 10/23/2023  Pt will report ability to walk her dog around her neighborhood more than "1 Loop" (~1 mi) without report of additional pain in order to demonstrate reduction in back pain and improvements in muscular endurance   Baseline: 06/26/2023: 1 Lap; 07/20/2023: 2 Laps  Goal status: Progressing   2.  Pt will decrease worst back pain by at least 2 points on the NPRS in order to demonstrate clinically significant reduction in back pain. Baseline: 06/26/2023: 8/10 NRPS; 07/20/2023: 3/10 NPS; 07/27/2023: 6/10 NPS.  Goal status: Progressing  3.  Pt will decrease mODI score by at least 13 points in order demonstrate clinically significant reduction in back pain/disability.       Baseline: 14 / 50 = 28.0 %; 13 / 50 = 26.0 % Goal status: Progressing  4.  Pt will increase 30 second sit to stand test from 13 to 18 repetitions (+5) in order to demonstrate clinically significant improvements in LE strength and endurance.  Baseline: 06/26/2023:  13; 07/27/2023: 17.5 Goal status: Progressing   5.  Pt will improve lateral step down without R hip adduction/IR to demonstrate improved hip stability and motor control for pain free recreational activities.  Baseline: 06/26/2023: Moderate R hip add/IR; 07/27/2023: minor R hip add/ir with increased reps (~8) Goal status: Progressing   PLAN: PT FREQUENCY: 1-2x/week  PT DURATION: 12 weeks  PLANNED INTERVENTIONS: Therapeutic exercises, Therapeutic activity, Neuromuscular re-education, Balance training, Gait training, Patient/Family education, Self Care, Joint mobilization, Joint manipulation, Dry Needling, Electrical stimulation, Spinal manipulation, Spinal mobilization, Cryotherapy, Moist heat, Manual therapy, and Re-evaluation.  PLAN FOR NEXT SESSION:  Progress Hip and Core Stabilization exercises. Lumbar mobility (extension based). Functional strengthening.    Satira Curet PT, DPT Physical Therapist- [facility]  07/31/2023, 5:38 PM

## 2023-08-01 ENCOUNTER — Other Ambulatory Visit: Payer: Self-pay | Admitting: *Deleted

## 2023-08-01 DIAGNOSIS — D509 Iron deficiency anemia, unspecified: Secondary | ICD-10-CM

## 2023-08-02 ENCOUNTER — Encounter

## 2023-08-03 ENCOUNTER — Inpatient Hospital Stay: Attending: Hematology and Oncology

## 2023-08-03 ENCOUNTER — Ambulatory Visit: Payer: Self-pay

## 2023-08-03 DIAGNOSIS — Z90721 Acquired absence of ovaries, unilateral: Secondary | ICD-10-CM | POA: Diagnosis not present

## 2023-08-03 DIAGNOSIS — Z1501 Genetic susceptibility to malignant neoplasm of breast: Secondary | ICD-10-CM | POA: Insufficient documentation

## 2023-08-03 DIAGNOSIS — Z8042 Family history of malignant neoplasm of prostate: Secondary | ICD-10-CM | POA: Diagnosis not present

## 2023-08-03 DIAGNOSIS — Z8041 Family history of malignant neoplasm of ovary: Secondary | ICD-10-CM | POA: Insufficient documentation

## 2023-08-03 DIAGNOSIS — D509 Iron deficiency anemia, unspecified: Secondary | ICD-10-CM | POA: Insufficient documentation

## 2023-08-03 DIAGNOSIS — Z1502 Genetic susceptibility to malignant neoplasm of ovary: Secondary | ICD-10-CM | POA: Insufficient documentation

## 2023-08-03 DIAGNOSIS — Z148 Genetic carrier of other disease: Secondary | ICD-10-CM | POA: Diagnosis not present

## 2023-08-03 LAB — CBC WITH DIFFERENTIAL (CANCER CENTER ONLY)
Abs Immature Granulocytes: 0.02 10*3/uL (ref 0.00–0.07)
Basophils Absolute: 0 10*3/uL (ref 0.0–0.1)
Basophils Relative: 0 %
Eosinophils Absolute: 0.1 10*3/uL (ref 0.0–0.5)
Eosinophils Relative: 2 %
HCT: 33.7 % — ABNORMAL LOW (ref 36.0–46.0)
Hemoglobin: 10.7 g/dL — ABNORMAL LOW (ref 12.0–15.0)
Immature Granulocytes: 0 %
Lymphocytes Relative: 24 %
Lymphs Abs: 1.6 10*3/uL (ref 0.7–4.0)
MCH: 25.2 pg — ABNORMAL LOW (ref 26.0–34.0)
MCHC: 31.8 g/dL (ref 30.0–36.0)
MCV: 79.5 fL — ABNORMAL LOW (ref 80.0–100.0)
Monocytes Absolute: 0.4 10*3/uL (ref 0.1–1.0)
Monocytes Relative: 6 %
Neutro Abs: 4.5 10*3/uL (ref 1.7–7.7)
Neutrophils Relative %: 68 %
Platelet Count: 319 10*3/uL (ref 150–400)
RBC: 4.24 MIL/uL (ref 3.87–5.11)
RDW: 15.8 % — ABNORMAL HIGH (ref 11.5–15.5)
WBC Count: 6.7 10*3/uL (ref 4.0–10.5)
nRBC: 0 % (ref 0.0–0.2)

## 2023-08-03 LAB — RETIC PANEL
Immature Retic Fract: 11.7 % (ref 2.3–15.9)
RBC.: 4.22 MIL/uL (ref 3.87–5.11)
Retic Count, Absolute: 47.7 10*3/uL (ref 19.0–186.0)
Retic Ct Pct: 1.1 % (ref 0.4–3.1)
Reticulocyte Hemoglobin: 24.2 pg — ABNORMAL LOW (ref >27.9)

## 2023-08-03 LAB — FERRITIN: Ferritin: 10 ng/mL — ABNORMAL LOW (ref 11–307)

## 2023-08-03 LAB — FOLATE: Folate: 17.5 ng/mL (ref >5.9)

## 2023-08-04 ENCOUNTER — Encounter: Payer: Self-pay | Admitting: Hematology and Oncology

## 2023-08-07 ENCOUNTER — Ambulatory Visit: Payer: Self-pay | Admitting: Hematology and Oncology

## 2023-08-07 ENCOUNTER — Ambulatory Visit

## 2023-08-07 DIAGNOSIS — M5459 Other low back pain: Secondary | ICD-10-CM | POA: Diagnosis not present

## 2023-08-07 DIAGNOSIS — M6281 Muscle weakness (generalized): Secondary | ICD-10-CM

## 2023-08-07 DIAGNOSIS — M25552 Pain in left hip: Secondary | ICD-10-CM

## 2023-08-07 NOTE — Therapy (Signed)
 OUTPATIENT PHYSICAL THERAPY THORACOLUMBAR TREATMENT     Patient Name: Alicia Carr MRN: [id number] DOB:11-17-1980, 43 y.o., female Today's Date: 08/07/2023  END OF SESSION:  PT End of Session - 08/07/23 1735     Visit Number 12    Number of Visits 25    Date for PT Re-Evaluation 09/18/23    Authorization Type [employer] 2025  VL:max combined 30 PT/OT/SLP    Authorization - Visit Number 11    Authorization - Number of Visits 30    Progress Note Due on Visit 20    PT Start Time 1732    PT Stop Time 1814    PT Time Calculation (min) 42 min    Activity Tolerance Patient limited by fatigue    Behavior During Therapy [facility] for tasks assessed/performed               Past Medical History:  Diagnosis Date   Alcoholism (HCC)    Anemia    Early hepatic fibrosis    Family history of ovarian cancer    Family history of prostate cancer    Gallstones    Genetic testing 11/29/2021   History of chicken pox    Hyperpigmentation    Longitudinal melanonychia    Nevus    right dorsal wrist   Positive ANA (antinuclear antibody)    Primary sclerosing cholangitis    Pruritus    Raynaud's syndrome    Shingles 07/18/2020   Vaginal delivery 2003   Past Surgical History:  Procedure Laterality Date   BILATERAL SALPINGECTOMY  12/03/2021   at time of RSO   CESAREAN SECTION  08/08/2006   CHOLECYSTECTOMY  2003   COLONOSCOPY  09/2020   CYSTOSCOPY  10/17/2022   [facility], Microscopic Hematuria, Repeat Micro UA in 1  year.   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   Vitamin D   deficiency 05/10/2023   Hepatic steatosis 08/17/2022   Right ovarian cyst 12/03/2021   Monoallelic mutation of RAD51C gene 11/30/2021   Genetic testing 11/29/2021   Family history of prostate cancer 11/04/2021   Family history of ovarian cancer 11/04/2021   Dermoid cyst of right ovary 10/28/2021   Intolerant of cold 08/02/2021   Menorrhagia 08/02/2021   Folliculitis 06/10/2021   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 cause 06/18/2020    PCP: Alexander Iba, PA  REFERRING PROVIDER: Matt Song, MD  REFERRING DIAG:  M54.50,G89.29 (ICD-10-CM) - Chronic bilateral low back pain without sciatica    RATIONALE FOR EVALUATION AND TREATMENT: Rehabilitation  THERAPY DIAG: Other low back pain  Muscle weakness (generalized)  Pain in left hip  ONSET DATE: Chronic > 1 yr  FOLLOW-UP APPT SCHEDULED WITH REFERRING PROVIDER: Not addressed    SUBJECTIVE:  SUBJECTIVE STATEMENT:  Intermittent diffuse Low Back Pain   PERTINENT HISTORY:   Pt reports to OPPT with chief concern of chronic ow back pain > 1 year.  She reports that sometimes she will stand up from a chair and she will experience diffuse low back pain. She reports that intense episodes of LBP will include intermittent "shooting pain" deep into her thigh. Patient reports focal weakness in both of her legs secondary to intense episodes. 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 Pain aggravated with transitional movements: getting up  from a chair, tying a shoe, bending to pick up items. She reports prolonged sitting in an uncomfortable surface (harder chair). She denies b/b changes, saddle paresthesia, abdominal pain, chills/fever, night sweats, nausea, vomiting, unrelenting pain.   Imaging (Per Chart Review 05/02/2022):  CLINICAL DATA:  Low back and left hip pain for several weeks, no known injury, initial encounter   EXAM: LUMBAR SPINE - 3 VIEW   COMPARISON:  None Available.   FINDINGS: Five lumbar type vertebral bodies are well visualized. Vertebral body height is well maintained. Minimal osteophytic changes are noted. No anterolisthesis is seen. No soft tissue abnormality is noted.  IMPRESSION: Mild degenerative changes of the lumbar spine.   Electronically Signed   By: Violeta Grey M.D.   On: 05/04/2022 00:20  PAIN:    Pain Intensity: Present: 0/10, Best: 0/10, Worst: 8/10 Pain location: Low Back  Pain Quality: intermittent and shooting  Radiating: Yes  Numbness/Tingling: Yes Focal Weakness: Yes 24-hour pain behavior: Activity Dependent  History of prior back injury, pain, surgery, or therapy: Yes Dominant hand: right Imaging: Yes   PRECAUTIONS: Fall  WEIGHT BEARING RESTRICTIONS: No  FALLS: Has patient fallen in last 6 months? No  Living Environment Lives with: lives with their family Lives in: House/apartment Stairs: No Has following equipment at home: None  Prior level of function: Independent  Hobbies: Reading   Patient Goals: Patient would like to reduce pain    OBJECTIVE:  Patient Surveys  Modified Oswestry 14 / 50 = 28.0 %   Cognition Patient is oriented to person, place, and time.  Recent memory is intact.  Remote memory is intact.  Attention span and concentration are intact.  Expressive speech is intact.  Patient's fund of knowledge is within normal limits for educational level.    Gross Musculoskeletal Assessment Tremor: None Bulk: Normal Tone:  Normal  GAIT: Distance walked: 62m Assistive device utilized: None Level of assistance: Complete Independence Comments: WNL   Posture: Lumbar lordosis: WNL Iliac crest height: Equal bilaterally Lumbar lateral shift: Negative  AROM AROM (Normal range in degrees) AROM   Lumbar   Flexion (65) 100%  Extension (30) 100%  Right lateral flexion (25) 100%  Left lateral flexion (25) 100%  Right rotation (30) 100%  Left rotation (30) 100%      Hip Right Left  Flexion (125) WNL WNL  Extension (15)    Abduction (40)    Adduction     Internal Rotation (45) WNL WNL  External Rotation (45) WNL WNL      Knee    Flexion (135)    Extension (0)        Ankle    Dorsiflexion (20)    Plantarflexion (50)    Inversion (35)    Eversion (15)    (* = pain; Blank rows = not tested)  LE MMT: MMT (out of 5) Right  Left*  Hip flexion 4 4  Hip extension    Hip abduction 4- 4-  Hip adduction    Hip internal rotation 4 4-  Hip external rotation 4 4-  Knee flexion 5 5  Knee extension 5 5  Ankle dorsiflexion 5 5  Ankle plantarflexion 5 5  Ankle inversion    Ankle eversion    (* = pain; Blank rows = not tested)  Sensation Grossly intact to light touch throughout bilateral LEs as determined by testing dermatomes L2-S2. Proprioception, stereognosis, and hot/cold testing deferred on this date.  Reflexes R/L Knee Jerk (L3/4): 2+/2+  Ankle Jerk (S1/2): 2+/2+   Muscle Length Hamstrings (90-90 Test): R: Positive for tightness 40 knee ext L: Positive for tightness 55 knee ext  Palpation Location Right Left         Lumbar paraspinals 1 0  Quadratus Lumborum 0 0  Gluteus Maximus 0 0  Gluteus Medius 1 1  Deep hip external rotators 0 1  PSIS 0 0  Fortin's Area (SIJ) 0 0  Greater Trochanter 0 0  (Blank rows = not tested) Graded on 0-4 scale (0 = no pain, 1 = pain, 2 = pain with wincing/grimacing/flinching, 3 = pain with withdrawal, 4 = unwilling to allow palpation)  Passive  Accessory Intervertebral Motion Pt reports reproduction of concordant pain at L4-S1, each segment and doesn't improve with continued CPA.   Special Tests Lumbar Radiculopathy and Discogenic: SLR (SN 92, -LR 0.29): R: Negative L:  Negative Crossed SLR (SP 90): R: Negative L: Negative  Hip: FABER (SN 81): R: Positive for tightness L: Negative FADIR (SN 94): R: Negative L: Positive for concordant pain  Piriformis Syndrome: FAIR Test (SN 88, SP 83):  L: Positive for Pain in hip   FUNCTIONAL TESTS:  30s Sit to Stand Test: 13 Reps  Lateral Step Down: R: Hip IR/ADD, L: Negative  Supine Bridge: B Single Leg Bridge: R/L: Unable to maintain elevated/ neutral pelvis  TODAY'S TREATMENT: DATE: 08/07/2023  Subjective: Patient reports 0/10 NPS in the lower back; pt reported taking Ibuprofen  prior to PT session. Radiating pain in lumbar spine improved since last visit .No questions or concerns.  Therapeutic Exercise (focused on lumbar mobility and core stabilization):    Supine Bridge   1 x 10 x 5s, no pain    2 x 10 , anti rotation against Blue TB     Standing Pallof Press (OMEGA Cable)    2 x 12, 5# (resistance from L)    2 x 12, 5# (resistance from L)    Dead Bug    Alternating LE/UE      2 x 12, last few reps reported minor back ache   Supine Alternating LE extended Marches with TrA activation    2 x 10    Flutter Kicks    1 x 10s   Minor pain in lower back   Therapeutic Activity (MULTIPLE parameters (balance, strength, ROM,  proprioception) to one or more areas of the body):  Sit to stands from [employer] Table with Med Ball Chest Press   3 x 10, 3 Kg     Lateral Stepping with Resistance Band   1 x 24', Blue TB around thigh    1 x 24', Blue TB around ankle    Kettlebell Squat    1 x 10 (from a 8" Stool)   1 x 10 partial squat    1 x 6 (multimodal cues for proper TrA Activation)   Tall Marches with  Bernice Bring Carry (RUE)    2 x 20 Alternating Marches    PATIENT EDU  CATION:  Education details: POC, HEP, Prognosis  Person educated: Patient Education method: Explanation, Demonstration, and Handouts Education comprehension: verbalized understanding and returned demonstration   HOME EXERCISE PROGRAM:   Access Code: [email or web address] URL: [email or web address] Date: 07/10/2023 Prepared by: Veryl Gottron Delana Manganello  Exercises - Supine Piriformis Stretch with Foot on Ground  - 2 x daily - 7 x weekly - 30-60s hold - Seated Hamstring Stretch  - 2 x daily - 7 x weekly - 30-60s hold - Seated Piriformis Stretch  - 2 x daily - 7 x weekly - 30-60s hold - Supine Bridge with Resistance Band  - 1 x daily - 7 x weekly - 2-3 sets - 10-12 reps - Supine Dead Bug with Leg Extension  - 1 x daily - 3-4 x weekly - 2-3 sets - 8-10 reps - Side Stepping with Resistance at Ankles  - 1 x daily - 3-4 x weekly - 2-3 sets - 10-12 reps  Access Code: [email or web address] URL: [email or web address] Date: 06/26/2023 Prepared by: Veryl Gottron Gabrielle Mester  Exercises - Supine Figure 4 Piriformis Stretch  - 2 x daily - 7 x weekly - 30-60s hold - Supine Piriformis Stretch with Foot on Ground  - 2 x daily - 7 x weekly - 30-60s hold - Seated Hamstring Stretch  - 2 x daily - 7 x weekly - 30-60s hold - Seated Piriformis Stretch  - 2 x daily - 7 x weekly - 30-60s hold - Supine Bridge  - 1 x daily - 7 x weekly - 2-3 sets - 12-15 reps - Sidelying Hip Abduction  - 1 x daily - 7 x weekly - 2-3 sets - 12-15 reps  ASSESSMENT:  CLINICAL IMPRESSION: Patient arrived to OPPT with a main focus on improving functional strength in order to support lumbar spine. Patient tolerated all interventions today without exacerbation in lumbar spine.  She required multimodal cues in order to maintain TrA stabilization throughout kettle bell squats. She still presents with generalized weakness and deficits in core stabilization. Pt will continue to benefit from skilled PT in order to improve QoL and facilitate return to  PLOF   OBJECTIVE IMPAIRMENTS: decreased activity tolerance, decreased endurance, difficulty walking, decreased strength, and pain.   ACTIVITY LIMITATIONS: lifting, bending, sitting, standing, and transfers  PARTICIPATION LIMITATIONS: community activity and walking pet  PERSONAL FACTORS: Age, Past/current experiences, Profession, and 1 comorbidity: benign joint hypermobility are also affecting patient's functional outcome.   REHAB POTENTIAL: Good  CLINICAL DECISION MAKING: Evolving/moderate complexity  EVALUATION COMPLEXITY: Moderate   GOALS: Goals reviewed with patient? Yes  SHORT TERM GOALS: Target date: 09/18/2023  Pt will be independent with HEP in order to improve strength and decrease back pain to improve pain-free function at home and work. Baseline: 06/26/23: Initial provided  Goal status: INITIAL   LONG TERM GOALS: Target date: 10/30/2023  Pt will report ability to walk her dog around her neighborhood more than "1 Loop" (~1 mi) without report of additional pain in order to demonstrate reduction in back pain and improvements in muscular endurance   Baseline: 06/26/2023: 1 Lap; 07/20/2023: 2 Laps  Goal status: Progressing   2.  Pt will decrease worst back pain by at least 2 points on the NPRS in order to demonstrate clinically significant reduction in back pain. Baseline: 06/26/2023: 8/10 NRPS; 07/20/2023: 3/10 NPS; 07/27/2023: 6/10 NPS.  Goal status: Progressing  3.  Pt will decrease mODI score by  at least 13 points in order demonstrate clinically significant reduction in back pain/disability.       Baseline: 14 / 50 = 28.0 %; 13 / 50 = 26.0 % Goal status: Progressing  4.  Pt will increase 30 second sit to stand test from 13 to 18 repetitions (+5) in order to demonstrate clinically significant improvements in LE strength and endurance.  Baseline: 06/26/2023: 13; 07/27/2023: 17.5 Goal status: Progressing   5.  Pt will improve lateral step down without R hip  adduction/IR to demonstrate improved hip stability and motor control for pain free recreational activities.  Baseline: 06/26/2023: Moderate R hip add/IR; 07/27/2023: minor R hip add/ir with increased reps (~8) Goal status: Progressing   PLAN: PT FREQUENCY: 1-2x/week  PT DURATION: 12 weeks  PLANNED INTERVENTIONS: Therapeutic exercises, Therapeutic activity, Neuromuscular re-education, Balance training, Gait training, Patient/Family education, Self Care, Joint mobilization, Joint manipulation, Dry Needling, Electrical stimulation, Spinal manipulation, Spinal mobilization, Cryotherapy, Moist heat, Manual therapy, and Re-evaluation.  PLAN FOR NEXT SESSION:  Progress Hip and Core Stabilization exercises. Lumbar mobility (extension based). Functional strengthening.    Satira Curet PT, DPT Physical Therapist- [facility]  08/07/2023, 10:13 PM

## 2023-08-09 ENCOUNTER — Encounter

## 2023-08-09 ENCOUNTER — Other Ambulatory Visit

## 2023-08-09 ENCOUNTER — Encounter: Admitting: Nurse Practitioner

## 2023-08-10 ENCOUNTER — Ambulatory Visit: Payer: Self-pay

## 2023-08-10 ENCOUNTER — Inpatient Hospital Stay: Admitting: Hematology and Oncology

## 2023-08-10 VITALS — BP 135/81 | HR 102 | Temp 97.9°F | Resp 17 | Wt 165.5 lb

## 2023-08-10 DIAGNOSIS — M5459 Other low back pain: Secondary | ICD-10-CM

## 2023-08-10 DIAGNOSIS — D509 Iron deficiency anemia, unspecified: Secondary | ICD-10-CM | POA: Diagnosis not present

## 2023-08-10 DIAGNOSIS — M6281 Muscle weakness (generalized): Secondary | ICD-10-CM

## 2023-08-10 DIAGNOSIS — M25552 Pain in left hip: Secondary | ICD-10-CM

## 2023-08-10 NOTE — Therapy (Signed)
 OUTPATIENT PHYSICAL THERAPY THORACOLUMBAR TREATMENT     Patient Name: Alicia Carr MRN: [id number] DOB:06/10/1980, 43 y.o., female Today's Date: 08/10/2023  END OF SESSION:  PT End of Session - 08/10/23 0809     Visit Number 13    Number of Visits 25    Date for PT Re-Evaluation 09/18/23    Authorization Type [employer] 2025  VL:max combined 30 PT/OT/SLP    Authorization - Number of Visits 30    Progress Note Due on Visit 20    PT Start Time 0810    PT Stop Time 0850    PT Time Calculation (min) 40 min    Activity Tolerance Patient limited by fatigue    Behavior During Therapy [facility] for tasks assessed/performed            Past Medical History:  Diagnosis Date   Alcoholism (HCC)    Anemia    Early hepatic fibrosis    Family history of ovarian cancer    Family history of prostate cancer    Gallstones    Genetic testing 11/29/2021   History of chicken pox    Hyperpigmentation    Longitudinal melanonychia    Nevus    right dorsal wrist   Positive ANA (antinuclear antibody)    Primary sclerosing cholangitis    Pruritus    Raynaud's syndrome    Shingles 07/18/2020   Vaginal delivery 2003   Past Surgical History:  Procedure Laterality Date   BILATERAL SALPINGECTOMY  12/03/2021   at time of RSO   CESAREAN SECTION  08/08/2006   CHOLECYSTECTOMY  2003   COLONOSCOPY  09/2020   CYSTOSCOPY  10/17/2022   [facility], Microscopic Hematuria, Repeat Micro UA in 1  year.   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   Vitamin D  deficiency 05/10/2023   Hepatic steatosis  08/17/2022   Right ovarian cyst 12/03/2021   Monoallelic mutation of RAD51C gene 11/30/2021   Genetic testing 11/29/2021   Family history of prostate cancer 11/04/2021   Family history of ovarian cancer 11/04/2021   Dermoid cyst of right ovary 10/28/2021   Intolerant of cold 08/02/2021   Menorrhagia 08/02/2021   Folliculitis 06/10/2021   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 cause 06/18/2020    PCP: Alexander Iba, PA  REFERRING PROVIDER: Matt Song, MD  REFERRING DIAG:  M54.50,G89.29 (ICD-10-CM) - Chronic bilateral low back pain without sciatica    RATIONALE FOR EVALUATION AND TREATMENT: Rehabilitation  THERAPY DIAG: Other low back pain  Muscle weakness (generalized)  Pain in left hip  ONSET DATE: Chronic > 1 yr  FOLLOW-UP APPT SCHEDULED WITH REFERRING PROVIDER: Not addressed    SUBJECTIVE:  SUBJECTIVE STATEMENT:  Intermittent diffuse Low Back Pain   PERTINENT HISTORY:   Pt reports to OPPT with chief concern of chronic ow back pain > 1 year.  She reports that sometimes she will stand up from a chair and she will experience diffuse low back pain. She reports that intense episodes of LBP will include intermittent shooting pain deep into her thigh. Patient reports focal weakness in both of her legs secondary to intense episodes. 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 Pain aggravated with transitional movements: getting up from a chair, tying a shoe, bending to pick  up items. She reports prolonged sitting in an uncomfortable surface (harder chair). She denies b/b changes, saddle paresthesia, abdominal pain, chills/fever, night sweats, nausea, vomiting, unrelenting pain.   Imaging (Per Chart Review 05/02/2022):  CLINICAL DATA:  Low back and left hip pain for several weeks, no known injury, initial encounter   EXAM: LUMBAR SPINE - 3 VIEW   COMPARISON:  None Available.   FINDINGS: Five lumbar type vertebral bodies are well visualized. Vertebral body height is well maintained. Minimal osteophytic changes are noted. No anterolisthesis is seen. No soft tissue abnormality is noted.  IMPRESSION: Mild degenerative changes of the lumbar spine.   Electronically Signed   By: Violeta Grey M.D.   On: 05/04/2022 00:20  PAIN:    Pain Intensity: Present: 0/10, Best: 0/10, Worst: 8/10 Pain location: Low Back  Pain Quality: intermittent and shooting  Radiating: Yes  Numbness/Tingling: Yes Focal Weakness: Yes 24-hour pain behavior: Activity Dependent  History of prior back injury, pain, surgery, or therapy: Yes Dominant hand: right Imaging: Yes   PRECAUTIONS: Fall  WEIGHT BEARING RESTRICTIONS: No  FALLS: Has patient fallen in last 6 months? No  Living Environment Lives with: lives with their family Lives in: House/apartment Stairs: No Has following equipment at home: None  Prior level of function: Independent  Hobbies: Reading   Patient Goals: Patient would like to reduce pain    OBJECTIVE:  Patient Surveys  Modified Oswestry 14 / 50 = 28.0 %   Cognition Patient is oriented to person, place, and time.  Recent memory is intact.  Remote memory is intact.  Attention span and concentration are intact.  Expressive speech is intact.  Patient's fund of knowledge is within normal limits for educational level.    Gross Musculoskeletal Assessment Tremor: None Bulk: Normal Tone: Normal  GAIT: Distance walked: 61m Assistive device  utilized: None Level of assistance: Complete Independence Comments: WNL   Posture: Lumbar lordosis: WNL Iliac crest height: Equal bilaterally Lumbar lateral shift: Negative  AROM AROM (Normal range in degrees) AROM   Lumbar   Flexion (65) 100%  Extension (30) 100%  Right lateral flexion (25) 100%  Left lateral flexion (25) 100%  Right rotation (30) 100%  Left rotation (30) 100%      Hip Right Left  Flexion (125) WNL WNL  Extension (15)    Abduction (40)    Adduction     Internal Rotation (45) WNL WNL  External Rotation (45) WNL WNL      Knee    Flexion (135)    Extension (0)        Ankle    Dorsiflexion (20)    Plantarflexion (50)    Inversion (35)    Eversion (15)    (* = pain; Blank rows = not tested)  LE MMT: MMT (out of 5) Right  Left*  Hip flexion 4 4  Hip extension    Hip abduction 4- 4-  Hip adduction    Hip internal rotation 4 4-  Hip external rotation 4 4-  Knee flexion 5 5  Knee extension 5 5  Ankle dorsiflexion 5 5  Ankle plantarflexion 5 5  Ankle inversion    Ankle eversion    (* = pain; Blank rows = not tested)  Sensation Grossly intact to light touch throughout bilateral LEs as determined by testing dermatomes L2-S2. Proprioception, stereognosis, and hot/cold testing deferred on this date.  Reflexes R/L Knee Jerk (L3/4): 2+/2+  Ankle Jerk (S1/2): 2+/2+   Muscle Length Hamstrings (90-90 Test): R: Positive for tightness 40 knee ext L: Positive for tightness 55 knee ext  Palpation Location Right Left         Lumbar paraspinals 1 0  Quadratus Lumborum 0 0  Gluteus Maximus 0 0  Gluteus Medius 1 1  Deep hip external rotators 0 1  PSIS 0 0  Fortin's Area (SIJ) 0 0  Greater Trochanter 0 0  (Blank rows = not tested) Graded on 0-4 scale (0 = no pain, 1 = pain, 2 = pain with wincing/grimacing/flinching, 3 = pain with withdrawal, 4 = unwilling to allow palpation)  Passive Accessory Intervertebral Motion Pt reports reproduction of  concordant pain at L4-S1, each segment and doesn't improve with continued CPA.   Special Tests Lumbar Radiculopathy and Discogenic: SLR (SN 92, -LR 0.29): R: Negative L:  Negative Crossed SLR (SP 90): R: Negative L: Negative  Hip: FABER (SN 81): R: Positive for tightness L: Negative FADIR (SN 94): R: Negative L: Positive for concordant pain  Piriformis Syndrome: FAIR Test (SN 88, SP 83):  L: Positive for Pain in hip   FUNCTIONAL TESTS:  30s Sit to Stand Test: 13 Reps  Lateral Step Down: R: Hip IR/ADD, L: Negative  Supine Bridge: B Single Leg Bridge: R/L: Unable to maintain elevated/ neutral pelvis  TODAY'S TREATMENT: DATE: 08/10/2023  Subjective: Patient reports 1/10 NPS in lower back described as an ache in lower lumbar spine. Patient reports intermittent radiating pain in lower back with prolonged walking (I.e. wlaking dog) .No questions or concerns.  Therapeutic Exercise (focused on lumbar mobility and core stabilization):    Cat Cow for lumbar mobility    1 x 10    Quadraped Thoracic Rotation - Reach Under    1 x 10 ea dir   Supine Bridge Progression   1 x 10    2 x 8, Feet placed on foam roller     Dead Bug    1 x 10, Alternating LE   2 x 12, VC for sustained TrA    Supine LE 90-90 with overhead GHJ flexion   1 x 10, 5#    1 x 10 x 2s hold at 180 Flexion       Hip Matrix Cable    Hip Abduction    3 x 10 25# ea leg   Therapeutic Activity (MULTIPLE parameters (balance, strength, ROM,  proprioception) to one or more areas of the body):  Standing RDL:  Kettlebell (20#)    1 x 10   Dumbbell (10# Ea UE)   1 x 10 (minor back pain)   Lateral Step Down 6 (SUE Support)   R/L: 2 x 10 ea leg    - LLE with toe touch down due to pain in knee  Walking Pallof Press for core stabilization while performing dynamic tasks at work  2 x 10 x 5# (Resistance from R)   TRX Squats with Blue TB around lower leg    2 x 10 (Second set into deeper range)    PATIENT EDU  CATION:  Education details: POC, HEP, Prognosis  Person educated: Patient Education method: Programmer, multimedia, Demonstration, and Handouts Education comprehension: verbalized understanding and returned demonstration   HOME EXERCISE PROGRAM:   Access Code: [email or web address] URL: [email or web address] Date: 07/10/2023 Prepared by: Veryl Gottron Shravya Wickwire  Exercises - Supine Piriformis Stretch with Foot on Ground  - 2 x daily - 7 x weekly - 30-60s hold - Seated Hamstring Stretch  - 2 x daily - 7 x weekly - 30-60s hold - Seated Piriformis Stretch  - 2 x daily - 7 x weekly - 30-60s hold - Supine Bridge with Resistance Band  - 1 x daily - 7 x weekly - 2-3 sets - 10-12 reps - Supine Dead Bug with Leg Extension  - 1 x daily - 3-4 x weekly - 2-3 sets - 8-10 reps - Side Stepping with Resistance at Ankles  - 1 x daily - 3-4 x weekly - 2-3 sets - 10-12 reps  Access Code: [email or web address] URL: [email or web address] Date: 06/26/2023 Prepared by: Veryl Gottron Travian Kerner  Exercises - Supine Figure 4 Piriformis Stretch  - 2 x daily - 7 x weekly - 30-60s hold - Supine Piriformis Stretch with Foot on Ground  - 2 x daily - 7 x weekly - 30-60s hold - Seated Hamstring Stretch  - 2 x daily - 7 x weekly - 30-60s hold - Seated Piriformis Stretch  - 2 x daily - 7 x weekly - 30-60s hold - Supine Bridge  - 1 x daily - 7 x weekly - 2-3 sets - 12-15 reps - Sidelying Hip Abduction  - 1 x daily - 7 x weekly - 2-3 sets - 12-15 reps  ASSESSMENT:  CLINICAL IMPRESSION: Patient arrived to OPPT with a main focus on improving functional strength in order to support lumbar spine. Lumbar mobility techniques reviewed in order to improve mobility and reported stiffness. PT  increased core stability intensity and gluteal strength today in order to improve walking distance without pain. Pt demonstrated improved core stability as she demo'd increased reps with supine bridge/dead bug without lower back pain. She still presents with  generalized weakness and deficits in core stabilization. Pt will continue to benefit from skilled PT in order to improve QoL and facilitate return to PLOF   OBJECTIVE IMPAIRMENTS: decreased activity tolerance, decreased endurance, difficulty walking, decreased strength, and pain.   ACTIVITY LIMITATIONS: lifting, bending, sitting, standing, and transfers  PARTICIPATION LIMITATIONS: community activity and walking pet  PERSONAL FACTORS: Age, Past/current experiences, Profession, and 1 comorbidity: benign joint hypermobility are also affecting patient's functional outcome.   REHAB POTENTIAL: Good  CLINICAL DECISION MAKING: Evolving/moderate complexity  EVALUATION COMPLEXITY: Moderate   GOALS: Goals reviewed with patient? Yes  SHORT TERM GOALS: Target date: 09/21/2023  Pt will be independent with HEP in order to improve strength and decrease back pain to improve pain-free function at home and work. Baseline: 06/26/23: Initial provided  Goal status: INITIAL   LONG TERM GOALS: Target date: 11/02/2023  Pt will report ability to walk her dog around her neighborhood more than 1 Loop (~1 mi) without report of additional pain in order to demonstrate reduction in back pain and improvements in muscular endurance   Baseline: 06/26/2023: 1 Lap; 07/20/2023: 2 Laps  Goal status: Progressing   2.  Pt will decrease worst back pain by  at least 2 points on the NPRS in order to demonstrate clinically significant reduction in back pain. Baseline: 06/26/2023: 8/10 NRPS; 07/20/2023: 3/10 NPS; 07/27/2023: 6/10 NPS.  Goal status: Progressing  3.  Pt will decrease mODI score by at least 13 points in order demonstrate clinically significant reduction in back pain/disability.       Baseline: 14 / 50 = 28.0 %; 13 / 50 = 26.0 % Goal status: Progressing  4.  Pt will increase 30 second sit to stand test from 13 to 18 repetitions (+5) in order to demonstrate clinically significant improvements in LE strength and  endurance.  Baseline: 06/26/2023: 13; 07/27/2023: 17.5 Goal status: Progressing   5.  Pt will improve lateral step down without R hip adduction/IR to demonstrate improved hip stability and motor control for pain free recreational activities.  Baseline: 06/26/2023: Moderate R hip add/IR; 07/27/2023: minor R hip add/ir with increased reps (~8) Goal status: Progressing   PLAN: PT FREQUENCY: 1-2x/week  PT DURATION: 12 weeks  PLANNED INTERVENTIONS: Therapeutic exercises, Therapeutic activity, Neuromuscular re-education, Balance training, Gait training, Patient/Family education, Self Care, Joint mobilization, Joint manipulation, Dry Needling, Electrical stimulation, Spinal manipulation, Spinal mobilization, Cryotherapy, Moist heat, Manual therapy, and Re-evaluation.  PLAN FOR NEXT SESSION:  Progress Hip and Core Stabilization exercises. Lumbar mobility (extension based). Functional strengthening.    Satira Curet PT, DPT Physical Therapist- [facility]  08/10/2023, 8:11 AM

## 2023-08-10 NOTE — Progress Notes (Signed)
 [facility] CONSULT NOTE  Patient Care Team: Alexander Iba, [place] as PCP - General (Physician Assistant)  CHIEF COMPLAINTS/PURPOSE OF CONSULTATION:  High risk for [facility]  ASSESSMENT & PLAN:    This is a very pleasant 43 year old female patient with pathogenic RAD50 1C mutation followed in the high risk breast clinic who has been sent to hematology for recommendations regarding iron deficiency anemia.  She apparently went to see her endocrinologist and complained of some bronzing of the skin, fatigue, joint pain and they were trying to work her up for possible iron overload disorder and was found to have low ferritin and hemoglobin in the 10 to 11 g/dL range hence referred for consideration of intravenous iron especially since she has some GI symptoms with oral iron.  We have discussed both oral iron and IV iron options.  I have offered oral iron for 8 weeks with ferrous sulfate  65 mg every other day versus IV iron.  She is leaning towards IV iron.  We have reviewed the adverse effects of IV iron including but not limited to fatigue, myalgias, flulike symptoms, rarely anaphylactic reaction and occasionally flareup of underlying rheumatological issues.  She is willing to try Venofer.  Orders have been placed.  She will return to clinic in approximately 6 months for follow-up for this.  With regards to her high risk breast cancer she will continue mammograms alternating with MRIs, mammogram order has been placed.  No concerns on breast exam today.  I will follow-up with her during her next appointment regarding this.  HISTORY OF PRESENTING ILLNESS:  Alicia Carr 43 y.o. 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  This is a very pleasant 43 year old female patient here for high risk breast cancer follow up.  Discussed the use of AI scribe software for clinical note transcription with the patient, who gave verbal consent to proceed.  History of Present Illness    She is a  pt of ours following up for high risk breast clinic, recently was found to have IDA hence for interim follow up.  She once again has multiple ongoing pains including hyperpigmentation, joint pain, fatigue and went to see an endocrinologist who tested iron panel probably looking for hemochromatosis but she was found to have iron deficiency anemia and she was sent back to hematology.  She does take some oral iron called gentle iron, she has underlying irritable bowel syndrome like symptoms hence did not want to take full dose of iron supplement.  She was hoping she can get iron infusion especially given her underlying GI symptoms with oral iron.  Rest of the pertinent 10 point ROS reviewed and neg.  MEDICAL HISTORY:  Past Medical History:  Diagnosis Date   Alcoholism (HCC)    Anemia    Early hepatic fibrosis    Family history of ovarian cancer    Family history of prostate cancer    Gallstones    Genetic testing 11/29/2021   History of chicken pox    Hyperpigmentation    Longitudinal melanonychia    Nevus    right dorsal wrist   Positive ANA (antinuclear antibody)    Primary sclerosing cholangitis    Pruritus    Raynaud's syndrome    Shingles 07/18/2020   Vaginal delivery 2003    SURGICAL HISTORY: Past Surgical History:  Procedure Laterality Date   BILATERAL SALPINGECTOMY  12/03/2021   at time of RSO   CESAREAN SECTION  08/08/2006   CHOLECYSTECTOMY  2003  COLONOSCOPY  09/2020   CYSTOSCOPY  10/17/2022   [facility], Microscopic Hematuria, Repeat Micro UA in 1  year.   LIVER BIOPSY  05/2020   ROBOTIC ASSISTED SALPINGO OOPHERECTOMY Bilateral 12/03/2021   Procedure: XI ROBOTIC ASSISTED BILATERAL SALPINGECTOMY AND RIGHT OOPHORECTOMY;  Surgeon: Kris Pester, MD;  Location: [facility] ORS;  Service: Gynecology;  Laterality: Bilateral;   TUBAL LIGATION  2023   Tubes were actually removed due to cancer  gene mutation    SOCIAL HISTORY: Social History   Socioeconomic History    Marital status: Married    Spouse name: Madelyne Schiff   Number of children: 2   Years of education: Not on file   Highest education level: Bachelor's degree (e.g., BA, AB, BS)  Occupational History   Occupation: Systems developer  Tobacco Use   Smoking status: Never    Passive exposure: Past   Smokeless tobacco: Never  Vaping Use   Vaping status: Never Used  Substance and Sexual Activity   Alcohol use: Yes    Alcohol/week: 1.0 standard drink of alcohol    Types: 1 Glasses of wine per week    Comment: Drinking when going out to eat and special occasions only   Drug use: No   Sexual activity: Yes    Birth control/protection: Surgical, None    Comment: My husband cannot have children  Other Topics Concern   Not on file  Social History Narrative   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, retail for [employer]   1 son born 2003 1 daughter born 2008   Alcohol use regular for 10+ years stopped 01/18/2020 was 2 to 3 glasses of wine daily   No drug use   Coffee and tea 2 or 3 a day for caffeine   Social Drivers of Corporate investment banker Strain: Low Risk  (06/20/2023)   [facility]   Overall Financial Resource Strain (CARDIA)    Difficulty of Paying Living Expenses: Not very hard  Recent Concern: Financial Resource Strain - Medium Risk (03/29/2023)   Overall Financial Resource Strain (CARDIA)    Difficulty of Paying Living Expenses: Somewhat hard  Food Insecurity: No Food Insecurity (06/20/2023)   Received from [facility]   Hunger Vital Sign    Within the past 12 months, you worried that your food would run out before you got the money to buy more.: Never true    Within the past 12 months, the food you bought just didn't last and you didn't have money to get more.: Never true  Transportation Needs: No Transportation Needs (06/20/2023)   Received from [facility] - Transportation    In the  past 12 months, has lack of transportation kept you from medical appointments or from getting medications?: No    Lack of Transportation (Non-Medical): No  Physical Activity: Insufficiently Active (03/29/2023)   Exercise Vital Sign    Days of Exercise per Week: 1 day    Minutes of Exercise per Session: 30 min  Stress: No Stress Concern Present (03/29/2023)   [employer] of Occupational Health - Occupational Stress Questionnaire    Feeling of Stress : Only a little  Social Connections: Moderately Integrated (03/29/2023)   Social Connection and Isolation Panel    Frequency of Communication with Friends and Family: Once a week    Frequency of Social Gatherings with Friends and Family: Once a week    Attends Religious  Services: More than 4 times per year    Active Member of Clubs or Organizations: Yes    Attends Engineer, structural: More than 4 times per year    Marital Status: Married  Catering manager Violence: Not on file    FAMILY HISTORY: Family History  Problem Relation Age of Onset   Mental illness Mother        paranoid schizophrenia   Hyperlipidemia Mother    Alcohol abuse Father    Colon polyps Father    Hypertension Father    Prostate cancer Father 56   Heart murmur Brother    Hypertension Brother    Cancer Maternal Grandmother        uncertain type   Healthy Daughter    Asthma Daughter    Learning disabilities Daughter    ADD / ADHD Daughter    Anxiety disorder Daughter    Healthy Son    Diabetes Paternal Aunt    Colon cancer Paternal Uncle    Ovarian cancer Paternal Great-grandmother    Ovarian cancer Other        PGF sister   Breast cancer Neg Hx    Endometrial cancer Neg Hx    Pancreatic cancer Neg Hx     ALLERGIES:  has no known allergies.  MEDICATIONS:  Current Outpatient Medications  Medication Sig Dispense Refill   DULoxetine  (CYMBALTA ) 30 MG capsule Take 1 capsule (30 mg total) by mouth daily. (Patient not taking: Reported on  05/10/2023) 30 capsule 6   ferrous sulfate  325 (65 FE) MG tablet Take 325 mg by mouth daily with breakfast. (Patient not taking: Reported on 05/10/2023)     hydrocortisone  (ANUSOL -HC) 2.5 % rectal cream Place 1 Application rectally 2 (two) times daily. For 1 week then prn (Patient not taking: Reported on 05/10/2023) 30 g 1   meclizine (ANTIVERT) 25 MG tablet Take 25 mg by mouth as needed for dizziness. (Patient not taking: Reported on 05/10/2023)     valACYclovir  (VALTREX ) 1000 MG tablet Take 1,000 mg by mouth 2 (two) times daily.     VITAMIN D , CHOLECALCIFEROL, PO Take 1 tablet by mouth daily. (Patient not taking: Reported on 05/10/2023)     No current facility-administered medications for this visit.     PHYSICAL EXAMINATION: ECOG PERFORMANCE STATUS: 0 - Asymptomatic  Vitals:   08/10/23 1449  BP: 135/81  Pulse: (!) 102  Resp: 17  Temp: 97.9 F (36.6 C)  SpO2: 100%   Filed Weights   08/10/23 1449  Weight: 165 lb 8 oz (75.1 kg)    Physical Exam Constitutional:      Appearance: Normal appearance.  Chest:     Comments: Bilateral breasts inspected.  No palpable masses or regional adenopathy.  Musculoskeletal:     Cervical back: Normal range of motion and neck supple. No rigidity.  Lymphadenopathy:     Cervical: No cervical adenopathy.   Neurological:     Mental Status: She is alert.      LABORATORY DATA:  I have reviewed the data as listed Lab Results  Component Value Date   WBC 6.7 08/03/2023   HGB 10.7 (L) 08/03/2023   HCT 33.7 (L) 08/03/2023   MCV 79.5 (L) 08/03/2023   PLT 319 08/03/2023     Chemistry      Component Value Date/Time   NA 141 03/29/2023 1305   NA 144 08/24/2016 0900   K 3.8 03/29/2023 1305   CL 107 03/29/2023 1305   CO2 27 03/29/2023 1305  BUN 18 03/29/2023 1305   BUN 17 08/24/2016 0900   CREATININE 0.82 03/29/2023 1305   CREATININE 0.82 12/02/2019 0823      Component Value Date/Time   CALCIUM 9.1 03/29/2023 1305   ALKPHOS 86 04/06/2023  0948   AST 26 04/06/2023 0948   ALT 22 04/06/2023 0948   BILITOT 0.3 04/06/2023 0948   BILITOT <0.2 12/21/2016 1222       RADIOGRAPHIC STUDIES: I have personally reviewed the radiological images as listed and agreed with the findings in the report. No results found.  All questions were answered. The patient knows to call the clinic with any problems, questions or concerns. I spent 30 minutes in the care of this patient including H and P, review of records, counseling and coordination of care.     Murleen Arms, MD 08/10/2023 2:53 PM

## 2023-08-11 ENCOUNTER — Telehealth: Payer: Self-pay

## 2023-08-11 ENCOUNTER — Encounter: Payer: Self-pay | Admitting: Hematology and Oncology

## 2023-08-11 NOTE — Telephone Encounter (Signed)
 Dr. Arno Bibles, patient will be scheduled as soon as possible.  Auth Submission: NO AUTH NEEDED Site of care: Site of care: [facility] Payer: [employer] commercial Medication & CPT/J Code(s) submitted: Venofer (Iron Sucrose) J1756 Route of submission (phone, fax, portal):  Phone # Fax # Auth type: Buy/Bill PB Units/visits requested: 300mg  x 3 doses Reference number:  Approval from: 08/11/23 to 12/11/23

## 2023-08-14 ENCOUNTER — Ambulatory Visit

## 2023-08-14 DIAGNOSIS — M6281 Muscle weakness (generalized): Secondary | ICD-10-CM

## 2023-08-14 DIAGNOSIS — M25552 Pain in left hip: Secondary | ICD-10-CM

## 2023-08-14 DIAGNOSIS — M5459 Other low back pain: Secondary | ICD-10-CM

## 2023-08-14 NOTE — Therapy (Signed)
 OUTPATIENT PHYSICAL THERAPY THORACOLUMBAR TREATMENT     Patient Name: Alicia Carr MRN: [id number] DOB:[birth year], 43 y.o., female Today's Date: 08/14/2023  END OF SESSION:  PT End of Session - 08/14/23 1736     Visit Number 14    Number of Visits 25    Date for PT Re-Evaluation 09/18/23    Authorization Type [employer] 2025  VL:max combined 30 PT/OT/SLP    Authorization - Number of Visits 30    Progress Note Due on Visit 20    PT Start Time 1735    PT Stop Time 1819    PT Time Calculation (min) 44 min    Activity Tolerance Patient limited by fatigue    Behavior During Therapy [facility] for tasks assessed/performed             Past Medical History:  Diagnosis Date   Alcoholism (HCC)    Anemia    Early hepatic fibrosis    Family history of ovarian cancer    Family history of prostate cancer    Gallstones    Genetic testing 11/29/2021   History of chicken pox    Hyperpigmentation    Longitudinal melanonychia    Nevus    right dorsal wrist   Positive ANA (antinuclear antibody)    Primary sclerosing cholangitis    Pruritus    Raynaud's syndrome    Shingles 07/18/2020   Vaginal delivery 2003   Past Surgical History:  Procedure Laterality Date   BILATERAL SALPINGECTOMY  12/03/2021   at time of RSO   CESAREAN SECTION  08/08/2006   CHOLECYSTECTOMY  2003   COLONOSCOPY  09/2020   CYSTOSCOPY  10/17/2022   [facility], Microscopic Hematuria, Repeat Micro UA in 1  year.   LIVER BIOPSY  05/2020   ROBOTIC ASSISTED SALPINGO OOPHERECTOMY Bilateral 12/03/2021   Procedure: XI ROBOTIC ASSISTED BILATERAL SALPINGECTOMY AND RIGHT OOPHORECTOMY;  Surgeon: Kris Pester, MD;  Location: [facility] ORS;  Service: Gynecology;  Laterality: Bilateral;   TUBAL LIGATION  2023   Tubes were actually removed due to cancer  gene mutation   Patient Active Problem List   Diagnosis Date Noted   IDA (iron deficiency anemia) 08/10/2023   Benign joint hypermobility 05/10/2023   Vitamin D   deficiency 05/10/2023   Hepatic steatosis 08/17/2022   Right ovarian cyst 12/03/2021   Monoallelic mutation of RAD51C gene 11/30/2021   Genetic testing 11/29/2021   Family history of prostate cancer 11/04/2021   Family history of ovarian cancer 11/04/2021   Dermoid cyst of right ovary 10/28/2021   Intolerant of cold 08/02/2021   Menorrhagia 08/02/2021   Folliculitis 06/10/2021   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 cause 06/18/2020    PCP: Alexander Iba, PA  REFERRING PROVIDER: Matt Song, MD  REFERRING DIAG:  M54.50,G89.29 (ICD-10-CM) - Chronic bilateral low back pain without sciatica    RATIONALE FOR EVALUATION AND TREATMENT: Rehabilitation  THERAPY DIAG: Other low back pain  Muscle weakness (generalized)  Pain in left hip  ONSET DATE: Chronic > 1 yr  FOLLOW-UP APPT SCHEDULED WITH REFERRING PROVIDER: Not addressed    SUBJECTIVE:  SUBJECTIVE STATEMENT:  Intermittent diffuse Low Back Pain   PERTINENT HISTORY:   Pt reports to OPPT with chief concern of chronic ow back pain > 1 year.  She reports that sometimes she will stand up from a chair and she will experience diffuse low back pain. She reports that intense episodes of LBP will include intermittent shooting pain deep into her thigh. Patient reports focal weakness in both of her legs secondary to intense episodes. 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 Pain aggravated with transitional movements: getting up  from a chair, tying a shoe, bending to pick up items. She reports prolonged sitting in an uncomfortable surface (harder chair). She denies b/b changes, saddle paresthesia, abdominal pain, chills/fever, night sweats, nausea, vomiting, unrelenting pain.   Imaging (Per Chart Review 05/02/2022):  CLINICAL DATA:  Low back and left hip pain for several weeks, no known injury, initial encounter   EXAM: LUMBAR SPINE - 3 VIEW   COMPARISON:  None Available.   FINDINGS: Five lumbar type vertebral bodies are well visualized. Vertebral body height is well maintained. Minimal osteophytic changes are noted. No anterolisthesis is seen. No soft tissue abnormality is noted.  IMPRESSION: Mild degenerative changes of the lumbar spine.   Electronically Signed   By: Violeta Grey M.D.   On: 05/04/2022 00:20  PAIN:    Pain Intensity: Present: 0/10, Best: 0/10, Worst: 8/10 Pain location: Low Back  Pain Quality: intermittent and shooting  Radiating: Yes  Numbness/Tingling: Yes Focal Weakness: Yes 24-hour pain behavior: Activity Dependent  History of prior back injury, pain, surgery, or therapy: Yes Dominant hand: right Imaging: Yes   PRECAUTIONS: Fall  WEIGHT BEARING RESTRICTIONS: No  FALLS: Has patient fallen in last 6 months? No  Living Environment Lives with: lives with their family Lives in: House/apartment Stairs: No Has following equipment at home: None  Prior level of function: Independent  Hobbies: Reading   Patient Goals: Patient would like to reduce pain    OBJECTIVE:  Patient Surveys  Modified Oswestry 14 / 50 = 28.0 %   Cognition Patient is oriented to person, place, and time.  Recent memory is intact.  Remote memory is intact.  Attention span and concentration are intact.  Expressive speech is intact.  Patient's fund of knowledge is within normal limits for educational level.    Gross Musculoskeletal Assessment Tremor: None Bulk: Normal Tone:  Normal  GAIT: Distance walked: 31m Assistive device utilized: None Level of assistance: Complete Independence Comments: WNL   Posture: Lumbar lordosis: WNL Iliac crest height: Equal bilaterally Lumbar lateral shift: Negative  AROM AROM (Normal range in degrees) AROM   Lumbar   Flexion (65) 100%  Extension (30) 100%  Right lateral flexion (25) 100%  Left lateral flexion (25) 100%  Right rotation (30) 100%  Left rotation (30) 100%      Hip Right Left  Flexion (125) WNL WNL  Extension (15)    Abduction (40)    Adduction     Internal Rotation (45) WNL WNL  External Rotation (45) WNL WNL      Knee    Flexion (135)    Extension (0)        Ankle    Dorsiflexion (20)    Plantarflexion (50)    Inversion (35)    Eversion (15)    (* = pain; Blank rows = not tested)  LE MMT: MMT (out of 5) Right  Left*  Hip flexion 4 4  Hip extension    Hip abduction 4- 4-  Hip adduction    Hip internal rotation 4 4-  Hip external rotation 4 4-  Knee flexion 5 5  Knee extension 5 5  Ankle dorsiflexion 5 5  Ankle plantarflexion 5 5  Ankle inversion    Ankle eversion    (* = pain; Blank rows = not tested)  Sensation Grossly intact to light touch throughout bilateral LEs as determined by testing dermatomes L2-S2. Proprioception, stereognosis, and hot/cold testing deferred on this date.  Reflexes R/L Knee Jerk (L3/4): 2+/2+  Ankle Jerk (S1/2): 2+/2+   Muscle Length Hamstrings (90-90 Test): R: Positive for tightness 40 knee ext L: Positive for tightness 55 knee ext  Palpation Location Right Left         Lumbar paraspinals 1 0  Quadratus Lumborum 0 0  Gluteus Maximus 0 0  Gluteus Medius 1 1  Deep hip external rotators 0 1  PSIS 0 0  Fortin's Area (SIJ) 0 0  Greater Trochanter 0 0  (Blank rows = not tested) Graded on 0-4 scale (0 = no pain, 1 = pain, 2 = pain with wincing/grimacing/flinching, 3 = pain with withdrawal, 4 = unwilling to allow palpation)  Passive  Accessory Intervertebral Motion Pt reports reproduction of concordant pain at L4-S1, each segment and doesn't improve with continued CPA.   Special Tests Lumbar Radiculopathy and Discogenic: SLR (SN 92, -LR 0.29): R: Negative L:  Negative Crossed SLR (SP 90): R: Negative L: Negative  Hip: FABER (SN 81): R: Positive for tightness L: Negative FADIR (SN 94): R: Negative L: Positive for concordant pain  Piriformis Syndrome: FAIR Test (SN 88, SP 83):  L: Positive for Pain in hip   FUNCTIONAL TESTS:  30s Sit to Stand Test: 13 Reps  Lateral Step Down: R: Hip IR/ADD, L: Negative  Supine Bridge: B Single Leg Bridge: R/L: Unable to maintain elevated/ neutral pelvis  TODAY'S TREATMENT: DATE: 08/14/2023  Subjective: Patient reports 5/10 in lower back radiating into the R posterior thigh. Pt reports that she was bending and lifting a lot at work and the repetitiveness exacerbated the pain. Pt reports that she will f/u with MD regarding progress in PT and overall wellness. No questions or concerns.  Therapeutic Exercise (focused on lumbar mobility and core stabilization):  Prone Press Up   2 x 15    VC for proper lumbar extension  L Sidelying Clamshell   2 x 12, Green TB around knee   Supine Posterior Tilt   1 x 10  Supine Posterior Tilt with alternating amarches  2 x 10   Supine Bridges:   1 x 10  3 x 10 hip abduction against PT resistance at knees  Standing Lumbar Extensions  1  x 20   Manual Therapy (10 min billed):  Manual Sciatic Nerve Glide (RLE only)   2 min - Deeper ranges of Hip flexion with repetitive Dorsiflexion applied  RLE Glute Crossover stretch  30s/bout x 4 in order to improve R gluteal pain and tissue extensibility  Double Knee to Chest   30s/bout x 3 in order to improve lower back  pain and tissue extensibility  Supine R Hip Flexor Stretch (RLE off mat table)  30s/bout x 2 in order to improve lower back, anterior hip pain and tissue extensibility  -  OP applied to distal thigh (report of lower back ache)  Long Axis Distraction with mobilization belt around ankle (RLE)  2 min x Grade III  in order to improve lower back pain and decrease nerve irritability.    PATIENT EDU CATION:  Education details: HEP, Exercise Technique    Person educated: Patient Education method: Explanation, Demonstration, and Handouts Education comprehension: verbalized understanding and returned demonstration   HOME EXERCISE PROGRAM:   Access Code: [email or web address] URL: [email or web address] Date: 07/10/2023 Prepared by: Veryl Gottron Andrei Mccook  Exercises - Supine Piriformis Stretch with Foot on Ground  - 2 x daily - 7 x weekly - 30-60s hold - Seated Hamstring Stretch  - 2 x daily - 7 x weekly - 30-60s hold - Seated Piriformis Stretch  - 2 x daily - 7 x weekly - 30-60s hold - Supine Bridge with Resistance Band  - 1 x daily - 7 x weekly - 2-3 sets - 10-12 reps - Supine Dead Bug with Leg Extension  - 1 x daily - 3-4 x weekly - 2-3 sets - 8-10 reps - Side Stepping with Resistance at Ankles  - 1 x daily - 3-4 x weekly - 2-3 sets - 10-12 reps  Access Code: [email or web address] URL: [email or web address] Date: 06/26/2023 Prepared by: Veryl Gottron Arel Tippen  Exercises - Supine Figure 4 Piriformis Stretch  - 2 x daily - 7 x weekly - 30-60s hold - Supine Piriformis Stretch with Foot on Ground  - 2 x daily - 7 x weekly - 30-60s hold - Seated Hamstring Stretch  - 2 x daily - 7 x weekly - 30-60s hold - Seated Piriformis Stretch  - 2 x daily - 7 x weekly - 30-60s hold - Supine Bridge  - 1 x daily - 7 x weekly - 2-3 sets - 12-15 reps - Sidelying Hip Abduction  - 1 x daily - 7 x weekly - 2-3 sets - 12-15 reps  ASSESSMENT:  CLINICAL IMPRESSION: Patient arrived to OPPT with continued focus on improving lower back pain and reducing left thigh pain. Patient limited due to pain in the lower back into distal foot. Pain centralized into lower back following PT interventions  but pt reports exacerbation with prolonged standing and walking. PT suspects exacerbation of pain from deficit in core stabilization endurance and lack of proper biomechanics during prolonged lifting throughout today's work shift. Manual techniques utilized in order to decrease reported tension and pain in the lumbar spine but little relief provided towards end of session. PT advised patient of heat modalities and extension based exercises in order to mitigate the pain or suspected radicular symptoms.  Pt will continue to benefit from skilled PT in order to improve QoL and facilitate return to PLOF   OBJECTIVE IMPAIRMENTS: decreased activity tolerance, decreased endurance, difficulty walking, decreased strength, and pain.   ACTIVITY LIMITATIONS: lifting, bending, sitting, standing, and transfers  PARTICIPATION LIMITATIONS: community activity and walking pet  PERSONAL FACTORS: Age, Past/current experiences, Profession, and 1 comorbidity: benign joint hypermobility are also affecting patient's functional outcome.   REHAB POTENTIAL: Good  CLINICAL DECISION MAKING: Evolving/moderate complexity  EVALUATION COMPLEXITY: Moderate   GOALS: Goals reviewed with patient? Yes  SHORT TERM GOALS: Target date: 09/25/2023  Pt will be independent with HEP in order to improve strength and decrease back pain to improve pain-free function at home and work. Baseline: 06/26/23: Initial provided  Goal status: INITIAL   LONG TERM GOALS: Target date: 11/06/2023  Pt will report ability to walk her dog around her neighborhood more than 1 Loop (~1 mi) without report of additional pain in order to demonstrate reduction in back pain and improvements in muscular  endurance   Baseline: 06/26/2023: 1 Lap; 07/20/2023: 2 Laps  Goal status: Progressing   2.  Pt will decrease worst back pain by at least 2 points on the NPRS in order to demonstrate clinically significant reduction in back pain. Baseline: 06/26/2023: 8/10  NRPS; 07/20/2023: 3/10 NPS; 07/27/2023: 6/10 NPS.  Goal status: Progressing  3.  Pt will decrease mODI score by at least 13 points in order demonstrate clinically significant reduction in back pain/disability.       Baseline: 14 / 50 = 28.0 %; 13 / 50 = 26.0 % Goal status: Progressing  4.  Pt will increase 30 second sit to stand test from 13 to 18 repetitions (+5) in order to demonstrate clinically significant improvements in LE strength and endurance.  Baseline: 06/26/2023: 13; 07/27/2023: 17.5 Goal status: Progressing   5.  Pt will improve lateral step down without R hip adduction/IR to demonstrate improved hip stability and motor control for pain free recreational activities.  Baseline: 06/26/2023: Moderate R hip add/IR; 07/27/2023: minor R hip add/ir with increased reps (~8) Goal status: Progressing   PLAN: PT FREQUENCY: 1-2x/week  PT DURATION: 12 weeks  PLANNED INTERVENTIONS: Therapeutic exercises, Therapeutic activity, Neuromuscular re-education, Balance training, Gait training, Patient/Family education, Self Care, Joint mobilization, Joint manipulation, Dry Needling, Electrical stimulation, Spinal manipulation, Spinal mobilization, Cryotherapy, Moist heat, Manual therapy, and Re-evaluation.  PLAN FOR NEXT SESSION:  Progress Hip and Core Stabilization exercises. Lumbar mobility (extension based). Functional strengthening.    Satira Curet PT, DPT Physical Therapist- [facility]  08/14/2023, 10:20 PM

## 2023-08-15 ENCOUNTER — Ambulatory Visit (INDEPENDENT_AMBULATORY_CARE_PROVIDER_SITE_OTHER)

## 2023-08-15 ENCOUNTER — Encounter: Payer: Self-pay | Admitting: Internal Medicine

## 2023-08-15 ENCOUNTER — Ambulatory Visit: Attending: Internal Medicine | Admitting: Internal Medicine

## 2023-08-15 VITALS — BP 120/75 | HR 98 | Temp 97.7°F | Resp 18 | Ht 60.75 in | Wt 168.8 lb

## 2023-08-15 VITALS — BP 127/85 | HR 90 | Resp 14 | Ht 60.75 in | Wt 168.0 lb

## 2023-08-15 DIAGNOSIS — R768 Other specified abnormal immunological findings in serum: Secondary | ICD-10-CM

## 2023-08-15 DIAGNOSIS — K8309 Other cholangitis: Secondary | ICD-10-CM

## 2023-08-15 DIAGNOSIS — K130 Diseases of lips: Secondary | ICD-10-CM

## 2023-08-15 DIAGNOSIS — M359 Systemic involvement of connective tissue, unspecified: Secondary | ICD-10-CM | POA: Diagnosis not present

## 2023-08-15 DIAGNOSIS — I7300 Raynaud's syndrome without gangrene: Secondary | ICD-10-CM | POA: Diagnosis not present

## 2023-08-15 DIAGNOSIS — D509 Iron deficiency anemia, unspecified: Secondary | ICD-10-CM

## 2023-08-15 DIAGNOSIS — K8301 Primary sclerosing cholangitis: Secondary | ICD-10-CM

## 2023-08-15 MED ORDER — SODIUM CHLORIDE 0.9 % IV SOLN
300.0000 mg | Freq: Once | INTRAVENOUS | Status: AC
  Administered 2023-08-15: 300 mg via INTRAVENOUS
  Filled 2023-08-15: qty 15

## 2023-08-15 NOTE — Progress Notes (Signed)
 Diagnosis: Iron Deficiency Anemia  Provider:  Praveen Mannam MD  Procedure: IV Infusion  IV Type: Peripheral, IV Location: L Forearm  Venofer (Iron Sucrose), Dose: 300 mg  Infusion Start Time: 1022  Infusion Stop Time: 1220  Post Infusion IV Care: Observation period completed and Peripheral IV Discontinued  Discharge: Condition: Good, Destination: Home . AVS Provided  Performed by:  Arseniy Toomey, RN

## 2023-08-15 NOTE — Patient Instructions (Signed)
 I recommend trying addition of diosmin complex (Vasculera) as a supplement for peripheral vascular inflammation symptoms such as raynaud's or varicose veins in the feet.  I attached some information about sulfasalazine, which is a medication we could consider trying if there are still signs of active inflammation on tests today. This drug is used for arthritis and also for IBD.

## 2023-08-15 NOTE — Progress Notes (Signed)
 Office Visit Note  [name]             Date of Birth: Jul 19, 1980           MRN: [id number]             PCP: Job Lukes, PA Referring: Job Lukes, PA Visit Date: 08/15/2023   Subjective:  Follow-up (Patient states she has small white bumps in her mouth. Patient states her joint pain and stiffness has gotten worse, and she feels like damage is happening and she is not taking anything for it. Patient states she does have numbness. )   Discussed the use of AI scribe software for clinical note transcription with the patient, who gave verbal consent to proceed.  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 Also with recent diagnosis confirming iron  deficiency anemia not yet treated with infusion. She presents with worsening circulation issues and joint pain.  She experiences persistent lower back pain radiating down her leg to the top of her foot, exacerbated by activities like walking. Physical therapy has not provided relief. Imaging from a year ago showed mild degenerative changes in her lumbar spine.  She has worsening pain and stiffness in her hands, with morning stiffness but no noticeable swelling. There is constant numbness and decreased sensitivity in her fingertips, which she attributes to a possible blood flow issue. She has not started duloxetine  due to concerns about liver enzyme elevations.  Her history of Raynaud's phenomenon began after a shingles infection three years ago. She experiences numbness in her toes, spider veins, and hyperpigmentation around her ankles. Medications like nifedipine and sildenafil  have been tried with limited success.  She is anemic, with a ferritin level of 10, and is scheduled for an iron  infusion. She has experienced angular cheilitis, which she associates with her iron   deficiency. She has been referred to an endocrinologist due to hormonal concerns, but her hormones have been normal except for iron  deficiency anemia.  She had a cellulitis infection on her shoulder a few months ago, treated with antibiotics, which she believes started as an insect bite. She has had recurrent infections, including bacterial eye infections, and has been taking more antibiotics recently.  She reports a new symptom of visible green veins and blotchiness on her skin, which she associates with hormonal changes, though her hormone levels are normal. She has a history of a positive ANA titer and elevated sedimentation rate, with a past positive anti-smooth muscle antibody test. 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         She experiences worsening joint pain, particularly in her hands, which has progressed to tingling, stiffness, and numbness, making it difficult to hold objects like her cell phone. Pain is also present in her hip and other joints. She has been referred to physical therapy previously. No sclerodactyly or hardening of the skin is noted. Intermittent swelling in her fingers and joint pain primarily in her pinkies are reported.   She associates some of her symptoms with circulation issues, such as blood pooling and micro hemorrhages in her fingers. She has been evaluated by a cardiologist who suggested a rheumatological cause for these symptoms.   She describes a history of hyperpigmentation, with spider veins and brown discoloration around her ankles. Dermatologists have suggested a hormonal cause, leading  to a referral to endocrinology, though she has not yet attended that appointment. She also reports non-painful canker sores in her mouth.   She has a history of recurrent shingles and is on ongoing Valtrex  treatment. She also experiences frequent eye infections and uses liquid tears for dry  eyes. She reports prolonged recovery from illnesses, such as a three-week episode of diarrhea in January.   She has a history of liver issues, but recent liver tests have been normal. She is concerned about starting duloxetine  due to potential liver interactions, although she has not yet begun the medication.      Previous HPI 02/08/2022 CATRICIA Carr is a 43 y.o. 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  She has been off any specific treatment since last visit besides supplementing vitamin D  and eyedrops.  Has not seen any resolution of hyperpigmented areas still has new skin rashes popping up in several areas and dry skin.  With cooler weather she is noticing blue discoloration in her fingers more frequently she is not sure if this is the Raynaud's specifically or just general cold intolerance.  She had genetic testing in September as recommended by Dr. Avram this was not positive for GI association did have mutation with increased risk of breast and ovarian cancer.  So she had right ovary as well as bilateral fallopian tubes removed when going for the ovarian cyst surgery.  Does think she might be having more arthralgias since quitting the hydroxychloroquine  but could also be related to cold weather.  Not seeing any obvious swelling or effusions.   10/26/21 Alicia Carr is a 43 y.o. female here for follow up for undifferentiated connective tissue disease symptoms.  Unclear regarding relation of symptoms to underlying diagnosis of early primary sclerosing cholangitis.  She stopped taking the hydroxychloroquine  for concern about a contributing to skin hyperpigmentation has not seen any change regarding this.  She did notice an increase in joint pains particularly around her knuckles and in bilateral knees. This is not debilitating or requiring her to take additional over-the-counter medication  to manage.  Raynaud's symptoms have been doing better and she thinks is associated with the recent warmer weather.  Repeat lab follow-up with normal liver function test.  She followed up with Dr. Avram regarding concerns and has been referred to see a geneticist upcoming appointment on September 6.   08/02/2021 Zahava LELON Myer is a 43 y.o. female here for follow up for raynaud's symptoms, currently taking plaquenil  200 mg by mouth once daily. Since our last visit she feels the joint pain and stiffness improved from before. She also has less raynaud's symptoms than before, although this is not unusual during warmer time of the year. She continues having the small areas of twitching from before and saw Dr. Margaret for this with workup including broad screening antibody testing and imaging of cervical and thoracic spine without major abnormalities. She reports progression of skin hyperpigmentation changes affecting her in multiple areas in patches and also some isolated small hyperpigmented spots on her face and palms and longitudinal changes in her fingernails. She had a nail biopsy consistent with melanotic macule. She saw Rocky Pereyra for this problem who had some concern about possible autoimmune vs genetic or proliferative causes. Symptoms started before exposure to hydroxychloroquine  but also concern for exacerbating symptoms. She has a lot of healthcare related anxiety surrounding this problem, worse after a coworker passed  unexpected from metastatic pancreatic cancer recently. She is drinking alcohol more frequently now and has started to gain weight and feels this is from depressed mood.   05/03/2021 Samyrah OAKLYN JAKUBEK is a 43 y.o. female here for follow up for raynaud's symptoms after starting sildenafil  20 mg. She continued noticing finger discoloration and rashes. She is also concerned with noticing some hyperpigmentation on the palms of her hands and also darkening of her gums. Still having some  twitching and aches and overall concerned she is not feeling well. She had a mild amount of leg swelling intermittently. She is concerned about if her symptoms could be an adrenal or hormonal issue with ongoing skin changes and generalized symptoms.   04/07/21 Allis LOGANN WHITEBREAD is a 43 y.o. female here for follow up with numerous different symptoms ongoing some in exacerbation.  So far addition of the amlodipine  has not significantly helped her finger or toe discoloration is actually having more blue and violaceous changes than before in the fingers.  She feels extremely cold much of the time somewhat regardless of the ambient temperature.  Chronic persistent fatigue remains her most significant issue.  She also continues having muscle twitching or inability to fully close or extend for periods of time the seem to come and go without trigger.  She has some hand swelling no but no increase in leg swelling.  She was feeling some palpitations and wore a heart monitor apparently this showed some episodes of moderate tachycardia but without concerning arrhythmias seen.   10/06/20 Olivette TIYE HUWE is a 43 y.o. female here with joint pains and numbness affecting bilateral fingers and toes that is worse especially in the past few months.  She developed shingles outbreak on the face and left eye in May this is treated and symptoms have improved besides some residual hyperpigmentation.  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  She reports morning stiffness difficulty gripping her hands tightly or performing tasks first thing in the morning this improves some time and is normal by the afternoon.  She notices occasional swelling or puffiness especially in the feet but not all the time.  She denies discoloration such as pallor, erythema, or cyanosis.  She tried taking oral diclofenac  for joint pains but  developed an episode of painless hematochezia so stopped this medication. She denies any lymphadenopathy, photosensitive rashes, or history of blood clots.   Review of Systems  Constitutional:  Positive for fatigue.  HENT:  Positive for mouth sores and mouth dryness.   Eyes:  Positive for dryness.  Respiratory:  Negative for shortness of breath.   Cardiovascular:  Negative for chest pain and palpitations.  Gastrointestinal:  Positive for constipation and diarrhea. Negative for blood in stool.  Endocrine: Negative for increased urination.  Genitourinary:  Negative for involuntary urination.  Musculoskeletal:  Positive for joint pain, joint pain, myalgias, morning stiffness and myalgias. Negative for gait problem, joint swelling, muscle weakness and muscle tenderness.  Skin:  Positive for color change and sensitivity to sunlight. Negative for rash and hair loss.  Allergic/Immunologic: Positive for susceptible to infections.  Neurological:  Positive for dizziness and headaches.  Hematological:  Negative for swollen glands.  Psychiatric/Behavioral:  Positive for sleep disturbance. Negative for depressed mood. The patient is not nervous/anxious.     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 cause 06/18/2020    Past Medical History:  Diagnosis Date   Alcoholism (HCC)    Anemia    Early hepatic fibrosis    Family history of ovarian cancer    Family history of prostate cancer    Gallstones    Genetic testing 11/29/2021   History of chicken pox    Hyperpigmentation    Longitudinal melanonychia    Nevus    right dorsal wrist   Positive ANA (antinuclear antibody)    Primary sclerosing cholangitis    Pruritus    Raynaud's syndrome    Shingles 07/18/2020   Vaginal delivery 2003    Family History  Problem Relation Age of Onset   Mental illness Mother        paranoid schizophrenia   Hyperlipidemia Mother    Alcohol abuse Father    Colon polyps Father    Hypertension Father    Prostate cancer Father 59   Heart murmur Brother    Hypertension Brother    Cancer Maternal Grandmother        uncertain type   Healthy Daughter    Asthma Daughter    Learning disabilities Daughter    ADD / ADHD Daughter    Anxiety disorder Daughter    Healthy Son    Diabetes Paternal Aunt    Colon cancer Paternal Uncle    Ovarian cancer Paternal Great-grandmother    Ovarian cancer Other        PGF sister   Breast cancer Neg Hx    Endometrial cancer Neg Hx    Pancreatic cancer Neg Hx    Past Surgical History:  Procedure Laterality Date   BILATERAL SALPINGECTOMY  12/03/2021   at time of RSO   CESAREAN SECTION  08/08/2006   CHOLECYSTECTOMY  2003   COLONOSCOPY  09/2020   CYSTOSCOPY  10/17/2022   [facility], Microscopic Hematuria, Repeat Micro UA in 1  year.   LIVER BIOPSY  05/2020   ROBOTIC ASSISTED SALPINGO OOPHERECTOMY Bilateral 12/03/2021   Procedure: XI ROBOTIC ASSISTED BILATERAL SALPINGECTOMY AND RIGHT OOPHORECTOMY;  Surgeon: Leonce Garnette BIRCH, MD;  Location: [facility];  Service:  Gynecology;  Laterality: Bilateral;   TUBAL LIGATION  2023   Tubes were actually removed due to cancer  gene mutation   Social History   Social History Narrative   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, retail for [employer]   1 son born 2003 1 daughter born 2008   Alcohol use regular for 10+ years stopped 01/18/2020 was 2 to 3 glasses of wine daily   No drug use   Coffee and tea 2 or 3 a day for caffeine   Immunization History  Administered Date(s) Administered   Hep  A / Hep B 09/08/2020, 10/12/2020   Hepatitis A, Adult 03/12/2021   Hepatitis B, PED/ADOLESCENT 03/12/2021, 03/12/2021   Influenza, Seasonal, Injecte, Preservative Fre 12/10/2022   Influenza,inj,Quad PF,6+ Mos 12/26/2018, 11/29/2019, 12/03/2020, 12/14/2021   Influenza,inj,quad, With Preservative 11/29/2019   PFIZER(Purple Top)SARS-COV-2 Vaccination 05/02/2019, 05/23/2019, 01/03/2020, 10/06/2020   Pfizer(Comirnaty)Fall Seasonal Vaccine 12 years and older 12/10/2022   Tdap 05/10/2006, 11/29/2019     Objective: Vital Signs: BP 127/85 (BP Location: Right Arm, Patient Position: Sitting, Cuff Size: Normal)   Pulse 90   Resp 14   Ht 5' 0.75 (1.543 m)   Wt 168 lb (76.2 kg)   LMP 08/04/2023   BMI 32.01 kg/m    Physical Exam HENT:     Mouth/Throat:     Comments: Tiny normal to hypopigmented bumps on lower gums  Eyes:     Conjunctiva/sclera: Conjunctivae normal.    Cardiovascular:     Rate and Rhythm: Normal rate and regular rhythm.  Pulmonary:     Effort: Pulmonary effort is normal.     Breath sounds: Normal breath sounds.  Lymphadenopathy:     Cervical: No cervical adenopathy.   Skin:    General: Skin is warm and dry.     Comments: Nailfold capillaries appear normal Melanonychia No digital pitting or telangiectasias   Neurological:     Mental Status: She is alert.   Psychiatric:        Mood and Affect: Mood normal.      Musculoskeletal Exam:  Shoulders full ROM no tenderness or  swelling Elbows hyperextensible, no swelling or tenderness Wrists full ROM no tenderness or swelling Midline and paraspinal low back tenderness to pressure Hip normal internal and external rotation without pain, no tenderness to lateral hip palpation Knees full ROM no tenderness or swelling Ankles full ROM no tenderness or swelling     Investigation: No additional findings.  Imaging: No results found.  Recent Labs: Lab Results  Component Value Date   WBC 6.7 08/03/2023   HGB 10.7 (L) 08/03/2023   PLT 319 08/03/2023   NA 136 08/15/2023   K 5.0 08/15/2023   CL 101 08/15/2023   CO2 26 08/15/2023   GLUCOSE 88 08/15/2023   BUN 16 08/15/2023   CREATININE 0.80 08/15/2023   BILITOT 0.2 08/15/2023   ALKPHOS 86 04/06/2023   AST 32 (H) 08/15/2023   ALT 39 (H) 08/15/2023   PROT 7.1 08/15/2023   ALBUMIN 4.3 04/06/2023   CALCIUM 9.3 08/15/2023   GFRAA 95 08/24/2016   QFTBGOLDPLUS NEGATIVE 07/23/2020    Speciality Comments: PLQ Eye Exam normal 02/08/2022 WNL [employer], OD f/u 6 months  Procedures:  No procedures performed Allergies: Patient has no known allergies.   Assessment / Plan:     Visit Diagnoses: Undifferentiated connective tissue disease (HCC) - Plan: Sedimentation rate, C-reactive protein, C3 and C4, ANCA Screen Reflex Titer(QUEST), IgG, IgA, IgM, Comprehensive metabolic panel with GFR Persistently positive ANA with low titer and mildly elevated sedimentation rate. Considering further testing for ANCA antibodies due to potential autoimmune process. Avoided liver-toxic medications due to liver concerns. - Order ANCA antibody testing, inflammatory markers, complements - Lack of benefit previusly would not resume HCq, CCB, or PDE5 due to ineffective  Raynaud's phenomenon Persistent numbness and decreased sensitivity in fingertips. Previous trials of nifedipine and sildenafil  ineffective. Discussed diosmin complex supplement for peripheral circulation. - Trial diosmin  complex supplement for 3-4 weeks.  PSC (Primary Sclerosing Cholangitis) Recent liver enzyme elevations. Awaiting liver specialist consultation before starting  duloxetine  due to liver function concerns.  Iron  deficiency anemia Iron  deficiency anemia with ferritin level of 10. Discussed potential autoimmune flare with iron  infusion, reassured manageable risks. Proceeding with scheduled iron  infusion.  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 Physical therapy improved functionality but not pain.   Cellulitis Cellulitis on arm, likely community-acquired staph infection. Treated with antibiotics, residual redness noted.     Orders: Orders Placed This Encounter  Procedures   Sedimentation rate   C-reactive protein   C3 and C4   ANCA Screen Reflex Titer(QUEST)   IgG, IgA, IgM   Comprehensive metabolic panel with GFR   No orders of the defined types were placed in this encounter.    Follow-Up Instructions: No follow-ups on file.   Lonni LELON Ester, MD  Note - This record has been created using [employer].  Chart creation errors have been sought, but may not always  have been located. Such creation errors do not reflect on  the standard of medical care.

## 2023-08-16 ENCOUNTER — Encounter: Payer: Self-pay | Admitting: Internal Medicine

## 2023-08-16 ENCOUNTER — Encounter

## 2023-08-16 LAB — COMPREHENSIVE METABOLIC PANEL WITH GFR
Albumin: 4.4 g/dL (ref 3.6–5.1)
CO2: 26 mmol/L (ref 20–32)
Calcium: 9.3 mg/dL (ref 8.6–10.2)
Sodium: 136 mmol/L (ref 135–146)
Total Protein: 7.1 g/dL (ref 6.1–8.1)
eGFR: 95 mL/min/{1.73_m2} (ref >=60)

## 2023-08-16 LAB — C-REACTIVE PROTEIN: CRP: <3 mg/L (ref <8.0)

## 2023-08-17 ENCOUNTER — Ambulatory Visit: Payer: Self-pay

## 2023-08-17 DIAGNOSIS — M5459 Other low back pain: Secondary | ICD-10-CM

## 2023-08-17 DIAGNOSIS — M25552 Pain in left hip: Secondary | ICD-10-CM

## 2023-08-17 DIAGNOSIS — M6281 Muscle weakness (generalized): Secondary | ICD-10-CM

## 2023-08-17 NOTE — Therapy (Signed)
 OUTPATIENT PHYSICAL THERAPY THORACOLUMBAR TREATMENT     Patient Name: Alicia Carr MRN: [id number] DOB:1980-11-27, 43 y.o., female Today's Date: 08/17/2023  END OF SESSION:  PT End of Session - 08/17/23 1651     Visit Number 15    Number of Visits 25    Date for PT Re-Evaluation 09/18/23    Authorization Type [employer] 2025  VL:max combined 30 PT/OT/SLP    Authorization - Number of Visits 30    Progress Note Due on Visit 20    PT Start Time 1655    PT Stop Time 1730    PT Time Calculation (min) 35 min    Activity Tolerance Patient limited by fatigue    Behavior During Therapy [facility] for tasks assessed/performed             Past Medical History:  Diagnosis Date   Alcoholism (HCC)    Anemia    Early hepatic fibrosis    Family history of ovarian cancer    Family history of prostate cancer    Gallstones    Genetic testing 11/29/2021   History of chicken pox    Hyperpigmentation    Longitudinal melanonychia    Nevus    right dorsal wrist   Positive ANA (antinuclear antibody)    Primary sclerosing cholangitis    Pruritus    Raynaud's syndrome    Shingles 07/18/2020   Vaginal delivery 2003   Past Surgical History:  Procedure Laterality Date   BILATERAL SALPINGECTOMY  12/03/2021   at time of RSO   CESAREAN SECTION  08/08/2006   CHOLECYSTECTOMY  2003   COLONOSCOPY  09/2020   CYSTOSCOPY  10/17/2022   [facility], Microscopic Hematuria, Repeat Micro UA in 1  year.   LIVER BIOPSY  05/2020   ROBOTIC ASSISTED SALPINGO OOPHERECTOMY Bilateral 12/03/2021   Procedure: XI ROBOTIC ASSISTED BILATERAL SALPINGECTOMY AND RIGHT OOPHORECTOMY;  Surgeon: Kris Pester, MD;  Location: [facility] ORS;  Service: Gynecology;  Laterality: Bilateral;   TUBAL LIGATION  2023   Tubes were actually removed due to cancer  gene mutation   Patient Active Problem List   Diagnosis Date Noted   Undifferentiated connective tissue disease (HCC) 08/15/2023   Angular cheilitis 08/15/2023    IDA (iron deficiency anemia) 08/10/2023   Benign joint hypermobility 05/10/2023   Vitamin D  deficiency 05/10/2023   Hepatic steatosis 08/17/2022   Right ovarian cyst 12/03/2021   Monoallelic mutation of RAD51C gene 11/30/2021   Genetic testing 11/29/2021   Family history of prostate cancer 11/04/2021   Family history of ovarian cancer 11/04/2021   Dermoid cyst of right ovary 10/28/2021   Intolerant of cold 08/02/2021   Menorrhagia 08/02/2021   Folliculitis 06/10/2021   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 cause 06/18/2020    PCP: Alexander Iba, PA  REFERRING PROVIDER: Matt Song, MD  REFERRING DIAG:  M54.50,G89.29 (ICD-10-CM) - Chronic bilateral low back pain without sciatica    RATIONALE FOR EVALUATION AND TREATMENT: Rehabilitation  THERAPY DIAG: Other low back pain  Muscle weakness (generalized)  Pain in left hip  ONSET DATE: Chronic > 1 yr  FOLLOW-UP APPT SCHEDULED WITH REFERRING PROVIDER: Not addressed    SUBJECTIVE:  SUBJECTIVE STATEMENT:  Intermittent diffuse Low Back Pain   PERTINENT HISTORY:   Pt reports to OPPT with chief concern of chronic ow back pain > 1 year.  She reports that sometimes she will stand up from a chair and she will experience diffuse low back pain. She reports that intense episodes of LBP will include intermittent shooting pain deep into her thigh. Patient reports focal weakness in both of her legs secondary to intense episodes. 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 Pain aggravated with transitional movements: getting up from a chair, tying a shoe, bending to pick up items. She reports prolonged sitting in an uncomfortable surface (harder chair). She denies b/b changes, saddle paresthesia, abdominal pain, chills/fever, night sweats, nausea, vomiting, unrelenting pain.   Imaging (Per Chart Review 05/02/2022):  CLINICAL DATA:  Low back and left hip pain for several weeks, no known injury, initial encounter   EXAM: LUMBAR SPINE - 3 VIEW   COMPARISON:  None Available.   FINDINGS: Five lumbar type vertebral bodies are well visualized. Vertebral body height is well maintained. Minimal osteophytic changes are noted. No anterolisthesis is seen. No soft tissue abnormality is noted.  IMPRESSION: Mild degenerative changes of the lumbar spine.   Electronically Signed   By: Violeta Grey M.D.   On: 05/04/2022 00:20  PAIN:    Pain Intensity: Present: 0/10, Best: 0/10, Worst: 8/10 Pain location: Low Back  Pain Quality: intermittent and shooting  Radiating: Yes  Numbness/Tingling: Yes Focal Weakness: Yes 24-hour pain behavior: Activity Dependent  History of prior back injury, pain, surgery, or therapy: Yes Dominant hand: right Imaging: Yes   PRECAUTIONS: Fall  WEIGHT BEARING RESTRICTIONS: No  FALLS: Has patient fallen in last 6 months? No  Living Environment Lives with: lives with their family Lives in: House/apartment Stairs: No Has following equipment at home: None  Prior level of function: Independent  Hobbies: Reading   Patient Goals: Patient would like to reduce pain    OBJECTIVE:  Patient Surveys  Modified Oswestry 14 / 50 = 28.0 %   Cognition Patient is oriented to person, place, and time.  Recent memory is intact.  Remote memory is intact.  Attention span and concentration are intact.  Expressive speech is intact.  Patient's fund of knowledge is within normal limits for  educational level.    Gross Musculoskeletal Assessment Tremor: None Bulk: Normal Tone: Normal  GAIT: Distance walked: 62m Assistive device utilized: None Level of assistance: Complete Independence Comments: WNL   Posture: Lumbar lordosis: WNL Iliac crest height: Equal bilaterally Lumbar lateral shift: Negative  AROM AROM (Normal range in degrees) AROM   Lumbar   Flexion (65) 100%  Extension (30) 100%  Right lateral flexion (25) 100%  Left lateral flexion (25) 100%  Right rotation (30) 100%  Left rotation (30) 100%      Hip Right Left  Flexion (125) WNL WNL  Extension (15)    Abduction (40)    Adduction     Internal Rotation (45) WNL WNL  External Rotation (45) WNL WNL      Knee    Flexion (135)    Extension (0)        Ankle    Dorsiflexion (20)    Plantarflexion (50)    Inversion (35)    Eversion (15)    (* = pain; Blank rows = not tested)  LE MMT: MMT (out of 5) Right  Left*  Hip flexion 4 4  Hip extension    Hip abduction 4- 4-  Hip adduction    Hip internal rotation 4 4-  Hip external rotation 4 4-  Knee flexion 5 5  Knee extension 5 5  Ankle dorsiflexion 5 5  Ankle plantarflexion 5 5  Ankle inversion    Ankle eversion    (* = pain; Blank rows = not tested)  Sensation Grossly intact to light touch throughout bilateral LEs as determined by testing dermatomes L2-S2. Proprioception, stereognosis, and hot/cold testing deferred on this date.  Reflexes R/L Knee Jerk (L3/4): 2+/2+  Ankle Jerk (S1/2): 2+/2+   Muscle Length Hamstrings (90-90 Test): R: Positive for tightness 40 knee ext L: Positive for tightness 55 knee ext  Palpation Location Right Left         Lumbar paraspinals 1 0  Quadratus Lumborum 0 0  Gluteus Maximus 0 0  Gluteus Medius 1 1  Deep hip external rotators 0 1  PSIS 0 0  Fortin's Area (SIJ) 0 0  Greater Trochanter 0 0  (Blank rows = not tested) Graded on 0-4 scale (0 = no pain, 1 = pain, 2 = pain with  wincing/grimacing/flinching, 3 = pain with withdrawal, 4 = unwilling to allow palpation)  Passive Accessory Intervertebral Motion Pt reports reproduction of concordant pain at L4-S1, each segment and doesn't improve with continued CPA.   Special Tests Lumbar Radiculopathy and Discogenic: SLR (SN 92, -LR 0.29): R: Negative L:  Negative Crossed SLR (SP 90): R: Negative L: Negative  Hip: FABER (SN 81): R: Positive for tightness L: Negative FADIR (SN 94): R: Negative L: Positive for concordant pain  Piriformis Syndrome: FAIR Test (SN 88, SP 83):  L: Positive for Pain in hip   FUNCTIONAL TESTS:  30s Sit to Stand Test: 13 Reps  Lateral Step Down: R: Hip IR/ADD, L: Negative  Supine Bridge: B Single Leg Bridge: R/L: Unable to maintain elevated/ neutral pelvis  TODAY'S TREATMENT: DATE: 08/17/2023  Subjective: Patient reports that she had a f/u with rheumatologist regarding lab work. Pt concerned about elevated ESR and indication of inflammation. Pt reports that radiating pain has centralized from the foot to the mid thigh. Pt reports that her symptoms from Monday have significantly improved. 2/10 pain located in the R thigh and lower back. No questions or concerns.  Therapeutic Exercise (focused on lumbar mobility and core stabilization):   NuStep L5-1 x 5 min x LE only (Seat 7) for LE warm up, endurance and strength; PT manually adjusted resistance throughout bout.    Hip Matrix:    Hip Abduction     3 x 10, 25# (no lower back pain)    Hip Flexion     1 x 10, 25#    2 x 10, 30#      Supine LE 90-90 with med ball OH press   1 x 10, Increased shoulder flexion ROM with additional reps   1 x 8, minor back pain with fatigue   1 x 8, multu modal cues for TrA activation    Supine Double Leg lift (90-90)    3 x 10   Hip Flexor Stretch on step    R/L: 30s/bout x 3 in order to improve hip flexor pain and tissue extensibility   Therapeutic Activity:    Kettle bell Squats    3 x  10, 20#    - Tactile cues provided for improve hip ext.      PATIENT EDU CATION:  Education details: HEP, Exercise Technique    Person educated: Patient Education method: Explanation, Demonstration, and Handouts Education comprehension: verbalized understanding and returned demonstration   HOME EXERCISE PROGRAM:   Access Code: [email or web address] URL: [email or web address] Date: 07/10/2023 Prepared by: Veryl Gottron Alyss Granato  Exercises - Supine Piriformis Stretch with Foot on Ground  - 2 x daily - 7 x weekly - 30-60s hold - Seated Hamstring Stretch  - 2 x daily - 7 x weekly - 30-60s hold - Seated Piriformis Stretch  - 2 x daily - 7 x weekly - 30-60s hold - Supine Bridge with Resistance Band  - 1 x daily - 7 x weekly - 2-3 sets - 10-12 reps - Supine Dead Bug with Leg Extension  - 1 x daily - 3-4 x weekly - 2-3 sets - 8-10 reps - Side Stepping with Resistance at Ankles  - 1 x daily - 3-4 x weekly - 2-3 sets - 10-12 reps  Access Code: [email or web address] URL: [email or web address] Date: 06/26/2023 Prepared by: Veryl Gottron Abdulmalik Darco  Exercises - Supine Figure 4 Piriformis Stretch  - 2 x daily - 7 x weekly - 30-60s hold - Supine Piriformis Stretch with Foot on Ground  - 2 x daily - 7 x weekly - 30-60s hold - Seated Hamstring Stretch  - 2 x daily - 7 x weekly - 30-60s hold - Seated Piriformis Stretch  - 2 x daily - 7 x weekly - 30-60s hold - Supine Bridge  - 1 x daily - 7 x weekly - 2-3 sets - 12-15 reps - Sidelying Hip Abduction  - 1 x daily - 7 x weekly - 2-3 sets - 12-15 reps  ASSESSMENT:  CLINICAL IMPRESSION: Continued PT POC with continued focus on improving lower back pain and functional strength. Patinet with improved lower back pain and increased tolerance to activities. She still presents with moderate fatigue throughout session but she is expecting iron supplementation in order to improve chronic fatigue syndrome. Pt still presenting with endurance deficits with core  stabilization; unable to maintain TrA activation throughout entire sets leading to lumbar back pain. She was able to demonstrate improved functional strength; tolerated multiple sets of squats while lifting a heavier object. PT suspects nerve root involvement as dermatomal pain becoming more consistent throughout her POC. PT advised patient of extension based techniques in order to mitigate pain. She will see referring provider about additional imaging regarding lower back as she is concerned about increased nerve involvement. Based on today's performance patient will continue to benefit from skilled physical therapy in order to improve QoL and facilitate return to PLOF.   OBJECTIVE IMPAIRMENTS: decreased activity tolerance, decreased endurance, difficulty walking, decreased strength, and pain.   ACTIVITY LIMITATIONS: lifting, bending, sitting, standing, and transfers  PARTICIPATION LIMITATIONS: community activity and walking pet  PERSONAL FACTORS: Age, Past/current experiences, Profession, and 1 comorbidity: benign joint hypermobility are also affecting patient's functional outcome.   REHAB POTENTIAL: Good  CLINICAL DECISION MAKING: Evolving/moderate complexity  EVALUATION COMPLEXITY: Moderate   GOALS: Goals reviewed with patient? Yes  SHORT TERM GOALS: Target date: 09/28/2023  Pt will be independent with HEP in order to improve strength and decrease back pain to improve pain-free function at home and work. Baseline: 06/26/23: Initial provided  Goal status: INITIAL   LONG TERM GOALS: Target date: 11/09/2023  Pt will report ability to walk her dog around her neighborhood more than 1 Loop (~1 mi) without report of additional pain in order to demonstrate reduction in back pain and improvements in muscular endurance  Baseline: 06/26/2023: 1 Lap; 07/20/2023: 2 Laps  Goal status: Progressing   2.  Pt will decrease worst back pain by at least 2 points on the NPRS in order to demonstrate  clinically significant reduction in back pain. Baseline: 06/26/2023: 8/10 NRPS; 07/20/2023: 3/10 NPS; 07/27/2023: 6/10 NPS.  Goal status: Progressing  3.  Pt will decrease mODI score by at least 13 points in order demonstrate clinically significant reduction in back pain/disability.       Baseline: 14 / 50 = 28.0 %; 13 / 50 = 26.0 % Goal status: Progressing  4.  Pt will increase 30 second sit to stand test from 13 to 18 repetitions (+5) in order to demonstrate clinically significant improvements in LE strength and endurance.  Baseline: 06/26/2023: 13; 07/27/2023: 17.5 Goal status: Progressing   5.  Pt will improve lateral step down without R hip adduction/IR to demonstrate improved hip stability and motor control for pain free recreational activities.  Baseline: 06/26/2023: Moderate R hip add/IR; 07/27/2023: minor R hip add/ir with increased reps (~8) Goal status: Progressing   PLAN: PT FREQUENCY: 1-2x/week  PT DURATION: 12 weeks  PLANNED INTERVENTIONS: Therapeutic exercises, Therapeutic activity, Neuromuscular re-education, Balance training, Gait training, Patient/Family education, Self Care, Joint mobilization, Joint manipulation, Dry Needling, Electrical stimulation, Spinal manipulation, Spinal mobilization, Cryotherapy, Moist heat, Manual therapy, and Re-evaluation.  PLAN FOR NEXT SESSION:  Progress Hip and Core Stabilization exercises. Lumbar mobility (extension based). Functional strengthening.    Satira Curet PT, DPT Physical Therapist- [facility]  08/17/2023, 4:53 PM

## 2023-08-18 ENCOUNTER — Other Ambulatory Visit: Payer: Self-pay | Admitting: Nurse Practitioner

## 2023-08-18 DIAGNOSIS — K760 Fatty (change of) liver, not elsewhere classified: Secondary | ICD-10-CM

## 2023-08-18 DIAGNOSIS — K7401 Hepatic fibrosis, early fibrosis: Secondary | ICD-10-CM

## 2023-08-18 DIAGNOSIS — R748 Abnormal levels of other serum enzymes: Secondary | ICD-10-CM

## 2023-08-18 DIAGNOSIS — K8301 Primary sclerosing cholangitis: Secondary | ICD-10-CM

## 2023-08-21 ENCOUNTER — Other Ambulatory Visit: Payer: Self-pay | Admitting: Hematology and Oncology

## 2023-08-21 DIAGNOSIS — D509 Iron deficiency anemia, unspecified: Secondary | ICD-10-CM

## 2023-08-21 DIAGNOSIS — Z1231 Encounter for screening mammogram for malignant neoplasm of breast: Secondary | ICD-10-CM

## 2023-08-21 LAB — COMPREHENSIVE METABOLIC PANEL WITH GFR
AG Ratio: 1.6 (calc) (ref 1.0–2.5)
ALT: 39 U/L — ABNORMAL HIGH (ref 6–29)
AST: 32 U/L — ABNORMAL HIGH (ref 10–30)
Alkaline phosphatase (APISO): 89 U/L (ref 31–125)
BUN: 16 mg/dL (ref 7–25)
Chloride: 101 mmol/L (ref 98–110)
Creat: 0.8 mg/dL (ref 0.50–0.99)
Globulin: 2.7 g/dL (ref 1.9–3.7)
Glucose, Bld: 88 mg/dL (ref 65–99)
Potassium: 5 mmol/L (ref 3.5–5.3)
Total Bilirubin: 0.2 mg/dL (ref 0.2–1.2)

## 2023-08-21 LAB — C3 AND C4
C3 Complement: 166 mg/dL (ref 83–193)
C4 Complement: 27 mg/dL (ref 15–57)

## 2023-08-21 LAB — SEDIMENTATION RATE: Sed Rate: 25 mm/h — ABNORMAL HIGH (ref 0–20)

## 2023-08-21 LAB — IGG, IGA, IGM
IgG (Immunoglobin G), Serum: 1175 mg/dL (ref 600–1640)
IgM, Serum: 128 mg/dL (ref 50–300)
Immunoglobulin A: 141 mg/dL (ref 47–310)

## 2023-08-21 LAB — ANCA SCREEN W REFLEX TITER: ANCA SCREEN: NEGATIVE

## 2023-08-22 ENCOUNTER — Ambulatory Visit

## 2023-08-22 ENCOUNTER — Telehealth: Payer: Self-pay

## 2023-08-22 NOTE — Telephone Encounter (Signed)
 Called patient about missed appointment. No answer but LVM regarding next following appointment. Advised of no show policy and to call for future rescheduling or concerns.

## 2023-08-24 ENCOUNTER — Ambulatory Visit

## 2023-08-28 ENCOUNTER — Ambulatory Visit: Payer: Self-pay

## 2023-08-28 DIAGNOSIS — M5459 Other low back pain: Secondary | ICD-10-CM | POA: Diagnosis not present

## 2023-08-28 DIAGNOSIS — M25552 Pain in left hip: Secondary | ICD-10-CM

## 2023-08-28 DIAGNOSIS — M6281 Muscle weakness (generalized): Secondary | ICD-10-CM

## 2023-08-28 NOTE — Therapy (Signed)
 OUTPATIENT PHYSICAL THERAPY THORACOLUMBAR TREATMENT     Patient Name: Alicia Carr MRN: [id number] DOB:[birth year], 43 y.o., female Today's Date: 08/28/2023  END OF SESSION:  PT End of Session - 08/28/23 1737     Visit Number 16    Number of Visits 25    Date for PT Re-Evaluation 09/18/23    Authorization Type [employer] 2025  VL:max combined 30 PT/OT/SLP    Authorization - Visit Number 15    Authorization - Number of Visits 30    Progress Note Due on Visit 20    PT Start Time 1737    PT Stop Time 1815    PT Time Calculation (min) 38 min    Activity Tolerance Patient limited by fatigue    Behavior During Therapy [facility] for tasks assessed/performed             Past Medical History:  Diagnosis Date   Alcoholism (HCC)    Anemia    Early hepatic fibrosis    Family history of ovarian cancer    Family history of prostate cancer    Gallstones    Genetic testing 11/29/2021   History of chicken pox    Hyperpigmentation    Longitudinal melanonychia    Nevus    right dorsal wrist   Positive ANA (antinuclear antibody)    Primary sclerosing cholangitis    Pruritus    Raynaud's syndrome    Shingles 07/18/2020   Vaginal delivery 2003   Past Surgical History:  Procedure Laterality Date   BILATERAL SALPINGECTOMY  12/03/2021   at time of RSO   CESAREAN SECTION  08/08/2006   CHOLECYSTECTOMY  2003   COLONOSCOPY  09/2020   CYSTOSCOPY  10/17/2022   [facility], Microscopic Hematuria, Repeat Micro UA in 1  year.   LIVER BIOPSY  05/2020   ROBOTIC ASSISTED SALPINGO OOPHERECTOMY Bilateral 12/03/2021   Procedure: XI ROBOTIC ASSISTED BILATERAL SALPINGECTOMY AND RIGHT OOPHORECTOMY;  Surgeon: Mace Garnette BIRCH, MD;  Location: [facility] ORS;  Service: Gynecology;  Laterality: Bilateral;   TUBAL LIGATION  2023   Tubes were actually removed due to cancer  gene mutation   Patient Active Problem List   Diagnosis Date Noted   Undifferentiated connective tissue disease (HCC) 08/15/2023    Angular cheilitis 08/15/2023   IDA (iron  deficiency anemia) 08/10/2023   Benign joint hypermobility 05/10/2023   Vitamin D  deficiency 05/10/2023   Hepatic steatosis 08/17/2022   Right ovarian cyst 12/03/2021   Monoallelic mutation of RAD51C gene 11/30/2021   Genetic testing 11/29/2021   Family history of prostate cancer 11/04/2021   Family history of ovarian cancer 11/04/2021   Dermoid cyst of right ovary 10/28/2021   Intolerant of cold 08/02/2021   Menorrhagia 08/02/2021   Folliculitis 06/10/2021   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 cause 06/18/2020    PCP: Job Lukes, PA  REFERRING PROVIDER: Jeannetta Lonni LELON, MD  REFERRING DIAG:  M54.50,G89.29 (ICD-10-CM) - Chronic bilateral low back pain without sciatica    RATIONALE FOR EVALUATION AND TREATMENT: Rehabilitation  THERAPY DIAG: Other low back pain  Muscle weakness (generalized)  Pain in left hip  ONSET DATE: Chronic > 1 yr  FOLLOW-UP APPT SCHEDULED WITH REFERRING PROVIDER: Not addressed    SUBJECTIVE:  SUBJECTIVE STATEMENT:  Intermittent diffuse Low Back Pain   PERTINENT HISTORY:   Pt reports to OPPT with chief concern of chronic ow back pain > 1 year.  She reports that sometimes she will stand up from a chair and she will experience diffuse low back pain. She reports that intense episodes of LBP will include intermittent shooting pain deep into her thigh. Patient reports focal weakness in both of her legs secondary to intense episodes. 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 Pain aggravated with transitional movements: getting up from a chair, tying a shoe, bending to pick up items. She reports prolonged sitting in an uncomfortable surface (harder chair). She denies b/b changes, saddle paresthesia, abdominal pain, chills/fever, night sweats, nausea, vomiting, unrelenting pain.   Imaging (Per Chart Review 05/02/2022):  CLINICAL DATA:  Low back and left hip pain for several weeks, no known injury, initial encounter   EXAM: LUMBAR SPINE - 3 VIEW   COMPARISON:  None Available.   FINDINGS: Five lumbar type vertebral bodies are well visualized. Vertebral body height is well maintained. Minimal osteophytic changes are noted. No anterolisthesis is seen. No soft tissue abnormality is noted.  IMPRESSION: Mild degenerative changes of the lumbar spine.   Electronically Signed   By: Oneil Devonshire M.D.   On: 05/04/2022 00:20  PAIN:    Pain Intensity: Present: 0/10, Best: 0/10, Worst: 8/10 Pain location: Low Back  Pain Quality: intermittent and shooting  Radiating: Yes  Numbness/Tingling: Yes Focal Weakness: Yes 24-hour pain behavior: Activity Dependent  History of prior back injury, pain, surgery, or therapy: Yes Dominant hand: right Imaging: Yes   PRECAUTIONS: Fall  WEIGHT BEARING RESTRICTIONS: No  FALLS: Has patient fallen in last 6 months? No  Living Environment Lives with: lives with their family Lives in: House/apartment Stairs: No Has following equipment at home: None  Prior level of function: Independent  Hobbies: Reading   Patient Goals: Patient would like to reduce pain    OBJECTIVE:  Patient Surveys  Modified Oswestry 14 / 50 = 28.0 %   Cognition Patient is oriented to person, place, and time.  Recent memory is intact.  Remote memory is intact.  Attention span and concentration are intact.  Expressive speech is intact.  Patient's fund of knowledge is  within normal limits for educational level.    Gross Musculoskeletal Assessment Tremor: None Bulk: Normal Tone: Normal  GAIT: Distance walked: 50m Assistive device utilized: None Level of assistance: Complete Independence Comments: WNL   Posture: Lumbar lordosis: WNL Iliac crest height: Equal bilaterally Lumbar lateral shift: Negative  AROM AROM (Normal range in degrees) AROM   Lumbar   Flexion (65) 100%  Extension (30) 100%  Right lateral flexion (25) 100%  Left lateral flexion (25) 100%  Right rotation (30) 100%  Left rotation (30) 100%      Hip Right Left  Flexion (125) WNL WNL  Extension (15)    Abduction (40)    Adduction     Internal Rotation (45) WNL WNL  External Rotation (45) WNL WNL      Knee    Flexion (135)    Extension (0)        Ankle    Dorsiflexion (20)    Plantarflexion (50)    Inversion (35)    Eversion (15)    (* = pain; Blank rows = not tested)  LE MMT: MMT (out of 5) Right  Left*  Hip flexion 4 4  Hip extension    Hip abduction 4- 4-  Hip adduction    Hip internal rotation 4 4-  Hip external rotation 4 4-  Knee flexion 5 5  Knee extension 5 5  Ankle dorsiflexion 5 5  Ankle plantarflexion 5 5  Ankle inversion    Ankle eversion    (* = pain; Blank rows = not tested)  Sensation Grossly intact to light touch throughout bilateral LEs as determined by testing dermatomes L2-S2. Proprioception, stereognosis, and hot/cold testing deferred on this date.  Reflexes R/L Knee Jerk (L3/4): 2+/2+  Ankle Jerk (S1/2): 2+/2+   Muscle Length Hamstrings (90-90 Test): R: Positive for tightness 40 knee ext L: Positive for tightness 55 knee ext  Palpation Location Right Left         Lumbar paraspinals 1 0  Quadratus Lumborum 0 0  Gluteus Maximus 0 0  Gluteus Medius 1 1  Deep hip external rotators 0 1  PSIS 0 0  Fortin's Area (SIJ) 0 0  Greater Trochanter 0 0  (Blank rows = not tested) Graded on 0-4 scale (0 = no pain, 1 = pain, 2  = pain with wincing/grimacing/flinching, 3 = pain with withdrawal, 4 = unwilling to allow palpation)  Passive Accessory Intervertebral Motion Pt reports reproduction of concordant pain at L4-S1, each segment and doesn't improve with continued CPA.   Special Tests Lumbar Radiculopathy and Discogenic: SLR (SN 92, -LR 0.29): R: Negative L:  Negative Crossed SLR (SP 90): R: Negative L: Negative  Hip: FABER (SN 81): R: Positive for tightness L: Negative FADIR (SN 94): R: Negative L: Positive for concordant pain  Piriformis Syndrome: FAIR Test (SN 88, SP 83):  L: Positive for Pain in hip   FUNCTIONAL TESTS:  30s Sit to Stand Test: 13 Reps  Lateral Step Down: R: Hip IR/ADD, L: Negative  Supine Bridge: B Single Leg Bridge: R/L: Unable to maintain elevated/ neutral pelvis  TODAY'S TREATMENT: DATE: 08/28/2023  Subjective: Patient reports baseline lower back pain 0/10 unless direct pressure is applied. Patient had experienced two bouts of sciatic related pain today. Patient still chronically fatigued even with additional sleep (8+ hours) yesterday. She reports that she missed an Iron  infusion and believes some of the fatigue is a result of lack of iron .  No questions or concerns.  Therapeutic Exercise (focused on lumbar mobility and core stabilization):  Supine LE Extension from 90-90   3 x 10  Supine LE 90-90 with weighted OH press    1 x 8, Weighted Dowel (8#) - minor back pain    2 x 10 3# Kg [employer]   2 x 10, Alternating UE/LE (Second Set with minor back pain due to endurance deficit)    Standing Pallof Press on Airex Pad    3 x 10   Reviewed HEP and additional exercises for functional strengthening/incr core stabilization.     Therapeutic Activity:    Kettle bell Squats    1 x 10 20# KB    1 x 10 30# KB (good carryover for lifting)    Standing on Airex Pad - Thoracic Rotations with Tidal Tank for external perturbation   1 x 10 - minor back pain, resolved  upon cessation    Tall marches with Tidal Tank (20#) on airex   1 x 20 - UE fatigue    Tall Marches with 10# Kettlebell on airex    1 x 20  PATIENT EDU CATION:  Education details:  Exercise Technique    Person educated: Patient Education method: Explanation, Demonstration, and Handouts Education comprehension: verbalized understanding and returned demonstration   HOME EXERCISE PROGRAM:   Access Code: [email or web address] URL: [email or web address] Date: 08/28/2023 Prepared by: Lonni Candis Kabel  Exercises - Supine Piriformis Stretch with Foot on Ground  - 2 x daily - 7 x weekly - 30-60s hold - Seated Hamstring Stretch  - 2 x daily - 7 x weekly - 30-60s hold - Seated Piriformis Stretch  - 2 x daily - 7 x weekly - 30-60s hold - Supine Bridge with Resistance Band  - 1 x daily - 7 x weekly - 2-3 sets - 10-12 reps - Supine Dead Bug with Leg Extension  - 1 x daily - 3-4 x weekly - 2-3 sets - 8-10 reps - Side Stepping with Resistance at Ankles  - 1 x daily - 3-4 x weekly - 2-3 sets - 10-12 reps - Clamshell with Resistance  - 1 x daily - 3-4 x weekly - 2-3 sets - 10-12 reps - Seated Thoracic Lumbar Extension  - 1 x daily - 4-5 x weekly - 3 sets - 15-20 reps - Squat with Chair Touch and Resistance Loop  - 1 x daily - 3-4 x weekly - 2-3 sets - 10-12 reps  ASSESSMENT:  CLINICAL IMPRESSION: Continued PT POC with continued focus on improving lower back pain and functional strength. Patinet with improved lower back pain and increased tolerance to activities. Patient demonstrated good effort with interventions however reported minor low back discomfort with increased challenge due to core endurance limitations. She continues to demonstrate good ability to tolerate anti rotational exercises in order to strengthen core stability. PT also focused on functional strengthening with external perturbation and proprioceptive demands using tidal tank. Based on today's performance patient will continue  to benefit from skilled physical therapy in order to improve QoL and facilitate return to PLOF.   OBJECTIVE IMPAIRMENTS: decreased activity tolerance, decreased endurance, difficulty walking, decreased strength, and pain.   ACTIVITY LIMITATIONS: lifting, bending, sitting, standing, and transfers  PARTICIPATION LIMITATIONS: community activity and walking pet  PERSONAL FACTORS: Age, Past/current experiences, Profession, and 1 comorbidity: benign joint hypermobility are also affecting patient's functional outcome.   REHAB POTENTIAL: Good  CLINICAL DECISION MAKING: Evolving/moderate complexity  EVALUATION COMPLEXITY: Moderate   GOALS: Goals reviewed with patient? Yes  SHORT TERM GOALS: Target date: 10/09/2023  Pt will be independent with HEP in order to improve strength and decrease back pain to improve pain-free function at home and work. Baseline: 06/26/23: Initial provided  Goal status: INITIAL   LONG TERM GOALS: Target date: 11/20/2023  Pt will report ability to walk her dog around her neighborhood more than 1 Loop (~1 mi) without report of additional pain in order to demonstrate reduction in back pain and improvements in muscular endurance   Baseline: 06/26/2023: 1 Lap; 07/20/2023: 2 Laps  Goal status: Progressing   2.  Pt will decrease worst back pain by at least 2 points on the NPRS in order to demonstrate clinically significant reduction in back pain. Baseline: 06/26/2023: 8/10 NRPS; 07/20/2023: 3/10 NPS; 07/27/2023: 6/10 NPS.  Goal status: Progressing  3.  Pt will decrease mODI score by at least 13 points in order demonstrate clinically significant reduction in back pain/disability.       Baseline: 14 / 50 = 28.0 %; 13 / 50 = 26.0 % Goal status: Progressing  4.  Pt will increase 30 second sit to  stand test from 13 to 18 repetitions (+5) in order to demonstrate clinically significant improvements in LE strength and endurance.  Baseline: 06/26/2023: 13; 07/27/2023:  17.5 Goal status: Progressing   5.  Pt will improve lateral step down without R hip adduction/IR to demonstrate improved hip stability and motor control for pain free recreational activities.  Baseline: 06/26/2023: Moderate R hip add/IR; 07/27/2023: minor R hip add/ir with increased reps (~8) Goal status: Progressing   PLAN: PT FREQUENCY: 1-2x/week  PT DURATION: 12 weeks  PLANNED INTERVENTIONS: Therapeutic exercises, Therapeutic activity, Neuromuscular re-education, Balance training, Gait training, Patient/Family education, Self Care, Joint mobilization, Joint manipulation, Dry Needling, Electrical stimulation, Spinal manipulation, Spinal mobilization, Cryotherapy, Moist heat, Manual therapy, and Re-evaluation.  PLAN FOR NEXT SESSION:  Progress Hip and Core Stabilization exercises. Lumbar mobility (extension based). Functional strengthening.    Lonni Pall PT, DPT Physical Therapist- [facility]  08/28/2023, 9:04 PM

## 2023-08-29 ENCOUNTER — Ambulatory Visit: Admitting: *Deleted

## 2023-08-29 ENCOUNTER — Telehealth: Payer: Self-pay | Admitting: *Deleted

## 2023-08-29 ENCOUNTER — Ambulatory Visit: Payer: Self-pay

## 2023-08-29 ENCOUNTER — Ambulatory Visit

## 2023-08-29 VITALS — BP 117/82 | HR 79 | Temp 98.8°F | Resp 18 | Ht 61.0 in | Wt 168.8 lb

## 2023-08-29 DIAGNOSIS — D509 Iron deficiency anemia, unspecified: Secondary | ICD-10-CM | POA: Diagnosis not present

## 2023-08-29 MED ORDER — SODIUM CHLORIDE 0.9 % IV SOLN
300.0000 mg | Freq: Once | INTRAVENOUS | Status: AC
  Administered 2023-08-29: 300 mg via INTRAVENOUS
  Filled 2023-08-29: qty 5

## 2023-08-29 NOTE — Progress Notes (Signed)
 Diagnosis: Acute Anemia  Provider:  Lonna Coder MD  Procedure: IV Infusion  IV Type: Peripheral, IV Location: L Forearm  Venofer  (Iron  Sucrose), Dose: 300 mg  Infusion Start Time: 1355   Infusion Stop Time: 1420  Post Infusion IV Care: Observation period completed  Discharge: Condition: Good, Destination: Home . AVS Provided  Performed by:  Mathew Therisa NOVAK, RN   [phone]

## 2023-08-29 NOTE — Telephone Encounter (Signed)
 Patient contacted the office stating she had a couple of questions. Patient states she would like her lab results. Patient states she did notice when reviewing on my chart she did notice her sed rate is elevated. Patient states she would like to know what this means. Patient also states she is seeing PT for her back pain, patient states her low back pain is not improving. Patient states her physical therapist told her she should reach out to see if she needs more imaging.

## 2023-08-31 ENCOUNTER — Ambulatory Visit: Attending: Internal Medicine

## 2023-08-31 DIAGNOSIS — M25552 Pain in left hip: Secondary | ICD-10-CM | POA: Diagnosis present

## 2023-08-31 DIAGNOSIS — M6281 Muscle weakness (generalized): Secondary | ICD-10-CM | POA: Diagnosis present

## 2023-08-31 DIAGNOSIS — M5459 Other low back pain: Secondary | ICD-10-CM | POA: Insufficient documentation

## 2023-08-31 NOTE — Therapy (Signed)
 OUTPATIENT PHYSICAL THERAPY THORACOLUMBAR TREATMENT     Patient Name: Alicia Carr MRN: [id number] DOB:1980-12-08, 43 y.o., female Today's Date: 08/31/2023  END OF SESSION:  PT End of Session - 08/31/23 1652     Visit Number 17    Number of Visits 25    Date for PT Re-Evaluation 09/18/23    Authorization Type [employer] 2025  VL:max combined 30 PT/OT/SLP    Authorization - Visit Number 16    Authorization - Number of Visits 30    Progress Note Due on Visit 20    PT Start Time 1651    PT Stop Time 1730    PT Time Calculation (min) 39 min    Activity Tolerance Patient limited by fatigue    Behavior During Therapy [facility] for tasks assessed/performed             Past Medical History:  Diagnosis Date   Alcoholism (HCC)    Anemia    Early hepatic fibrosis    Family history of ovarian cancer    Family history of prostate cancer    Gallstones    Genetic testing 11/29/2021   History of chicken pox    Hyperpigmentation    Longitudinal melanonychia    Nevus    right dorsal wrist   Positive ANA (antinuclear antibody)    Primary sclerosing cholangitis    Pruritus    Raynaud's syndrome    Shingles 07/18/2020   Vaginal delivery 2003   Past Surgical History:  Procedure Laterality Date   BILATERAL SALPINGECTOMY  12/03/2021   at time of RSO   CESAREAN SECTION  08/08/2006   CHOLECYSTECTOMY  2003   COLONOSCOPY  09/2020   CYSTOSCOPY  10/17/2022   [facility], Microscopic Hematuria, Repeat Micro UA in 1  year.   LIVER BIOPSY  05/2020   ROBOTIC ASSISTED SALPINGO OOPHERECTOMY Bilateral 12/03/2021   Procedure: XI ROBOTIC ASSISTED BILATERAL SALPINGECTOMY AND RIGHT OOPHORECTOMY;  Surgeon: Mace Garnette BIRCH, MD;  Location: [facility] ORS;  Service: Gynecology;  Laterality: Bilateral;   TUBAL LIGATION  2023   Tubes were actually removed due to cancer  gene mutation   Patient Active Problem List   Diagnosis Date Noted   Undifferentiated connective tissue disease (HCC) 08/15/2023    Angular cheilitis 08/15/2023   IDA (iron  deficiency anemia) 08/10/2023   Benign joint hypermobility 05/10/2023   Vitamin D  deficiency 05/10/2023   Hepatic steatosis 08/17/2022   Right ovarian cyst 12/03/2021   Monoallelic mutation of RAD51C gene 11/30/2021   Genetic testing 11/29/2021   Family history of prostate cancer 11/04/2021   Family history of ovarian cancer 11/04/2021   Dermoid cyst of right ovary 10/28/2021   Intolerant of cold 08/02/2021   Menorrhagia 08/02/2021   Folliculitis 06/10/2021   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 cause 06/18/2020    PCP: Job Lukes, PA  REFERRING PROVIDER: Jeannetta Lonni LELON, MD  REFERRING DIAG:  M54.50,G89.29 (ICD-10-CM) - Chronic bilateral low back pain without sciatica    RATIONALE FOR EVALUATION AND TREATMENT: Rehabilitation  THERAPY DIAG: Other low back pain  Muscle weakness (generalized)  Pain in left hip  ONSET DATE: Chronic > 1 yr  FOLLOW-UP APPT SCHEDULED WITH REFERRING PROVIDER: Not addressed    SUBJECTIVE:  SUBJECTIVE STATEMENT:  Intermittent diffuse Low Back Pain   PERTINENT HISTORY:   Pt reports to OPPT with chief concern of chronic ow back pain > 1 year.  She reports that sometimes she will stand up from a chair and she will experience diffuse low back pain. She reports that intense episodes of LBP will include intermittent shooting pain deep into her thigh. Patient reports focal weakness in both of her legs secondary to intense episodes. 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 Pain aggravated with transitional movements: getting up from a chair, tying a shoe, bending to pick up items. She reports prolonged sitting in an uncomfortable surface (harder chair). She denies b/b changes, saddle paresthesia, abdominal pain, chills/fever, night sweats, nausea, vomiting, unrelenting pain.   Imaging (Per Chart Review 05/02/2022):  CLINICAL DATA:  Low back and left hip pain for several weeks, no known injury, initial encounter   EXAM: LUMBAR SPINE - 3 VIEW   COMPARISON:  None Available.   FINDINGS: Five lumbar type vertebral bodies are well visualized. Vertebral body height is well maintained. Minimal osteophytic changes are noted. No anterolisthesis is seen. No soft tissue abnormality is noted.  IMPRESSION: Mild degenerative changes of the lumbar spine.   Electronically Signed   By: Oneil Devonshire M.D.   On: 05/04/2022 00:20  PAIN:    Pain Intensity: Present: 0/10, Best: 0/10, Worst: 8/10 Pain location: Low Back  Pain Quality: intermittent and shooting  Radiating: Yes  Numbness/Tingling: Yes Focal Weakness: Yes 24-hour pain behavior: Activity Dependent  History of prior back injury, pain, surgery, or therapy: Yes Dominant hand: right Imaging: Yes   PRECAUTIONS: Fall  WEIGHT BEARING RESTRICTIONS: No  FALLS: Has patient fallen in last 6 months? No  Living Environment Lives with: lives with their family Lives in: House/apartment Stairs: No Has following equipment at home: None  Prior level of function: Independent  Hobbies: Reading   Patient Goals: Patient would like to reduce pain    OBJECTIVE:  Patient Surveys  Modified Oswestry 14 / 50 = 28.0 %   Cognition Patient is oriented to person, place, and time.  Recent memory is intact.  Remote memory is intact.  Attention span and concentration are intact.  Expressive speech is intact.  Patient's fund of knowledge is  within normal limits for educational level.    Gross Musculoskeletal Assessment Tremor: None Bulk: Normal Tone: Normal  GAIT: Distance walked: 37m Assistive device utilized: None Level of assistance: Complete Independence Comments: WNL   Posture: Lumbar lordosis: WNL Iliac crest height: Equal bilaterally Lumbar lateral shift: Negative  AROM AROM (Normal range in degrees) AROM   Lumbar   Flexion (65) 100%  Extension (30) 100%  Right lateral flexion (25) 100%  Left lateral flexion (25) 100%  Right rotation (30) 100%  Left rotation (30) 100%      Hip Right Left  Flexion (125) WNL WNL  Extension (15)    Abduction (40)    Adduction     Internal Rotation (45) WNL WNL  External Rotation (45) WNL WNL      Knee    Flexion (135)    Extension (0)        Ankle    Dorsiflexion (20)    Plantarflexion (50)    Inversion (35)    Eversion (15)    (* = pain; Blank rows = not tested)  LE MMT: MMT (out of 5) Right  Left*  Hip flexion 4 4  Hip extension    Hip abduction 4- 4-  Hip adduction    Hip internal rotation 4 4-  Hip external rotation 4 4-  Knee flexion 5 5  Knee extension 5 5  Ankle dorsiflexion 5 5  Ankle plantarflexion 5 5  Ankle inversion    Ankle eversion    (* = pain; Blank rows = not tested)  Sensation Grossly intact to light touch throughout bilateral LEs as determined by testing dermatomes L2-S2. Proprioception, stereognosis, and hot/cold testing deferred on this date.  Reflexes R/L Knee Jerk (L3/4): 2+/2+  Ankle Jerk (S1/2): 2+/2+   Muscle Length Hamstrings (90-90 Test): R: Positive for tightness 40 knee ext L: Positive for tightness 55 knee ext  Palpation Location Right Left         Lumbar paraspinals 1 0  Quadratus Lumborum 0 0  Gluteus Maximus 0 0  Gluteus Medius 1 1  Deep hip external rotators 0 1  PSIS 0 0  Fortin's Area (SIJ) 0 0  Greater Trochanter 0 0  (Blank rows = not tested) Graded on 0-4 scale (0 = no pain, 1 = pain, 2  = pain with wincing/grimacing/flinching, 3 = pain with withdrawal, 4 = unwilling to allow palpation)  Passive Accessory Intervertebral Motion Pt reports reproduction of concordant pain at L4-S1, each segment and doesn't improve with continued CPA.   Special Tests Lumbar Radiculopathy and Discogenic: SLR (SN 92, -LR 0.29): R: Negative L:  Negative Crossed SLR (SP 90): R: Negative L: Negative  Hip: FABER (SN 81): R: Positive for tightness L: Negative FADIR (SN 94): R: Negative L: Positive for concordant pain  Piriformis Syndrome: FAIR Test (SN 88, SP 83):  L: Positive for Pain in hip   FUNCTIONAL TESTS:  30s Sit to Stand Test: 13 Reps  Lateral Step Down: R: Hip IR/ADD, L: Negative  Supine Bridge: B Single Leg Bridge: R/L: Unable to maintain elevated/ neutral pelvis  TODAY'S TREATMENT: DATE: 08/31/2023  Subjective: Patient reports baseline lower back pain 0/10 unless direct pressure is applied to the lower lumbar. She reports a lot of mental fatigue following long work day. She states that today her workplace demanded a lot of mental demands. No questions or concerns.  Therapeutic Exercise (focused on lumbar mobility and core stabilization):  NuStep L5-2x 5 min x LE x > 90 SPM (Seat 7) for LE warm up, endurance and strength; PT manually adjusted resistance throughout bout.   Supine Double Leg lift into extension for core stability  3 x 10  Supine LE 90-90 with weighted OH press    3 x 10 Weighted Dowel (8#)    Supine LE 90-90 alternating hip flexion against resistance    3 x 10, Red TB    - minor pain in the lower back towards the end of ea set  Therapeutic Activity:    Lateral Stepping against resistance while reacting to blazepod for dual tasking and gluteal strength   3 trials x 30s     Lateral Step Down with blazepod (focus) deactivation for dual task and gluteal strength      3 Trails x 30s     Standing Barbell Landmine Rotations for core stability with dynamic  movements   1 x 10, slowed tempo    2 x 10, fast tempo        PATIENT EDU CATION:  Education details:  Exercise Technique    Person educated: Patient Education method: Explanation and Demonstration Education comprehension: verbalized  understanding and returned demonstration   HOME EXERCISE PROGRAM:   Access Code: [email or web address] URL: [email or web address] Date: 08/28/2023 Prepared by: Lonni Genesis Novosad  Exercises - Supine Piriformis Stretch with Foot on Ground  - 2 x daily - 7 x weekly - 30-60s hold - Seated Hamstring Stretch  - 2 x daily - 7 x weekly - 30-60s hold - Seated Piriformis Stretch  - 2 x daily - 7 x weekly - 30-60s hold - Supine Bridge with Resistance Band  - 1 x daily - 7 x weekly - 2-3 sets - 10-12 reps - Supine Dead Bug with Leg Extension  - 1 x daily - 3-4 x weekly - 2-3 sets - 8-10 reps - Side Stepping with Resistance at Ankles  - 1 x daily - 3-4 x weekly - 2-3 sets - 10-12 reps - Clamshell with Resistance  - 1 x daily - 3-4 x weekly - 2-3 sets - 10-12 reps - Seated Thoracic Lumbar Extension  - 1 x daily - 4-5 x weekly - 3 sets - 15-20 reps - Squat with Chair Touch and Resistance Loop  - 1 x daily - 3-4 x weekly - 2-3 sets - 10-12 reps  ASSESSMENT:  CLINICAL IMPRESSION: Continued PT POC with continued focus on improving lower back pain and functional strength. Patient tolerated the session well, however though minor discomfort in the lower back was noted during resisted supine hip flexion exercises. She continues to lack core endurance requiring multimodal cues in order to maintain TrA activation with exercises. Dual-task activities using Blazepod added cognitive challenge in order improve brain fog. PT will  continue with progressive core strengthening, gluteal activation, and endurance training to support functional goals and minimize recurrence of low back discomfort. Based on today's performance patient will continue to benefit from skilled physical therapy  in order to improve QoL and facilitate return to PLOF.   OBJECTIVE IMPAIRMENTS: decreased activity tolerance, decreased endurance, difficulty walking, decreased strength, and pain.   ACTIVITY LIMITATIONS: lifting, bending, sitting, standing, and transfers  PARTICIPATION LIMITATIONS: community activity and walking pet  PERSONAL FACTORS: Age, Past/current experiences, Profession, and 1 comorbidity: benign joint hypermobility are also affecting patient's functional outcome.   REHAB POTENTIAL: Good  CLINICAL DECISION MAKING: Evolving/moderate complexity  EVALUATION COMPLEXITY: Moderate   GOALS: Goals reviewed with patient? Yes  SHORT TERM GOALS: Target date: 10/12/2023  Pt will be independent with HEP in order to improve strength and decrease back pain to improve pain-free function at home and work. Baseline: 06/26/23: Initial provided  Goal status: INITIAL   LONG TERM GOALS: Target date: 11/23/2023  Pt will report ability to walk her dog around her neighborhood more than 1 Loop (~1 mi) without report of additional pain in order to demonstrate reduction in back pain and improvements in muscular endurance   Baseline: 06/26/2023: 1 Lap; 07/20/2023: 2 Laps  Goal status: Progressing   2.  Pt will decrease worst back pain by at least 2 points on the NPRS in order to demonstrate clinically significant reduction in back pain. Baseline: 06/26/2023: 8/10 NRPS; 07/20/2023: 3/10 NPS; 07/27/2023: 6/10 NPS.  Goal status: Progressing  3.  Pt will decrease mODI score by at least 13 points in order demonstrate clinically significant reduction in back pain/disability.       Baseline: 14 / 50 = 28.0 %; 13 / 50 = 26.0 % Goal status: Progressing  4.  Pt will increase 30 second sit to stand test from 13 to 18 repetitions (+5) in order to demonstrate clinically  significant improvements in LE strength and endurance.  Baseline: 06/26/2023: 13; 07/27/2023: 17.5 Goal status: Progressing   5.  Pt will  improve lateral step down without R hip adduction/IR to demonstrate improved hip stability and motor control for pain free recreational activities.  Baseline: 06/26/2023: Moderate R hip add/IR; 07/27/2023: minor R hip add/ir with increased reps (~8) Goal status: Progressing   PLAN: PT FREQUENCY: 1-2x/week  PT DURATION: 12 weeks  PLANNED INTERVENTIONS: Therapeutic exercises, Therapeutic activity, Neuromuscular re-education, Balance training, Gait training, Patient/Family education, Self Care, Joint mobilization, Joint manipulation, Dry Needling, Electrical stimulation, Spinal manipulation, Spinal mobilization, Cryotherapy, Moist heat, Manual therapy, and Re-evaluation.  PLAN FOR NEXT SESSION:  Progress Hip and Core Stabilization exercises. Lumbar mobility (extension based). Functional strengthening.    Lonni Pall PT, DPT Physical Therapist- [facility]  08/31/2023, 5:03 PM

## 2023-09-06 ENCOUNTER — Ambulatory Visit

## 2023-09-06 DIAGNOSIS — M6281 Muscle weakness (generalized): Secondary | ICD-10-CM

## 2023-09-06 DIAGNOSIS — M25552 Pain in left hip: Secondary | ICD-10-CM

## 2023-09-06 DIAGNOSIS — M5459 Other low back pain: Secondary | ICD-10-CM | POA: Diagnosis not present

## 2023-09-06 NOTE — Therapy (Signed)
 OUTPATIENT PHYSICAL THERAPY THORACOLUMBAR TREATMENT     Patient Name: Alicia Carr MRN: [id number] DOB:Mar 17, 1980, 43 y.o., female Today's Date: 09/06/2023  END OF SESSION:  PT End of Session - 09/06/23 1816     Visit Number 18    Number of Visits 25    Date for PT Re-Evaluation 09/18/23    Authorization Type [employer] 2025  VL:max combined 30 PT/OT/SLP    Authorization - Visit Number 17    Authorization - Number of Visits 30    Progress Note Due on Visit 20    PT Start Time 1815    PT Stop Time 1900    PT Time Calculation (min) 45 min    Activity Tolerance Patient limited by fatigue    Behavior During Therapy [facility] for tasks assessed/performed             Past Medical History:  Diagnosis Date   Alcoholism (HCC)    Anemia    Early hepatic fibrosis    Family history of ovarian cancer    Family history of prostate cancer    Gallstones    Genetic testing 11/29/2021   History of chicken pox    Hyperpigmentation    Longitudinal melanonychia    Nevus    right dorsal wrist   Positive ANA (antinuclear antibody)    Primary sclerosing cholangitis    Pruritus    Raynaud's syndrome    Shingles 07/18/2020   Vaginal delivery 2003   Past Surgical History:  Procedure Laterality Date   BILATERAL SALPINGECTOMY  12/03/2021   at time of RSO   CESAREAN SECTION  08/08/2006   CHOLECYSTECTOMY  2003   COLONOSCOPY  09/2020   CYSTOSCOPY  10/17/2022   [facility], Microscopic Hematuria, Repeat Micro UA in 1  year.   LIVER BIOPSY  05/2020   ROBOTIC ASSISTED SALPINGO OOPHERECTOMY Bilateral 12/03/2021   Procedure: XI ROBOTIC ASSISTED BILATERAL SALPINGECTOMY AND RIGHT OOPHORECTOMY;  Surgeon: Mace Garnette BIRCH, MD;  Location: [facility] ORS;  Service: Gynecology;  Laterality: Bilateral;   TUBAL LIGATION  2023   Tubes were actually removed due to cancer  gene mutation   Patient Active Problem List   Diagnosis Date Noted   Undifferentiated connective tissue disease (HCC) 08/15/2023    Angular cheilitis 08/15/2023   IDA (iron  deficiency anemia) 08/10/2023   Benign joint hypermobility 05/10/2023   Vitamin D  deficiency 05/10/2023   Hepatic steatosis 08/17/2022   Right ovarian cyst 12/03/2021   Monoallelic mutation of RAD51C gene 11/30/2021   Genetic testing 11/29/2021   Family history of prostate cancer 11/04/2021   Family history of ovarian cancer 11/04/2021   Dermoid cyst of right ovary 10/28/2021   Intolerant of cold 08/02/2021   Menorrhagia 08/02/2021   Folliculitis 06/10/2021   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 cause 06/18/2020    PCP: Job Lukes, PA  REFERRING PROVIDER: Jeannetta Lonni LELON, MD  REFERRING DIAG:  M54.50,G89.29 (ICD-10-CM) - Chronic bilateral low back pain without sciatica    RATIONALE FOR EVALUATION AND TREATMENT: Rehabilitation  THERAPY DIAG: Other low back pain  Muscle weakness (generalized)  Pain in left hip  ONSET DATE: Chronic > 1 yr  FOLLOW-UP APPT SCHEDULED WITH REFERRING PROVIDER: Not addressed    SUBJECTIVE:  SUBJECTIVE STATEMENT:  Intermittent diffuse Low Back Pain   PERTINENT HISTORY:   Pt reports to OPPT with chief concern of chronic ow back pain > 1 year.  She reports that sometimes she will stand up from a chair and she will experience diffuse low back pain. She reports that intense episodes of LBP will include intermittent shooting pain deep into her thigh. Patient reports focal weakness in both of her legs secondary to intense episodes. 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 Pain aggravated with transitional movements: getting up from a chair, tying a shoe, bending to pick up items. She reports prolonged sitting in an uncomfortable surface (harder chair). She denies b/b changes, saddle paresthesia, abdominal pain, chills/fever, night sweats, nausea, vomiting, unrelenting pain.   Imaging (Per Chart Review 05/02/2022):  CLINICAL DATA:  Low back and left hip pain for several weeks, no known injury, initial encounter   EXAM: LUMBAR SPINE - 3 VIEW   COMPARISON:  None Available.   FINDINGS: Five lumbar type vertebral bodies are well visualized. Vertebral body height is well maintained. Minimal osteophytic changes are noted. No anterolisthesis is seen. No soft tissue abnormality is noted.  IMPRESSION: Mild degenerative changes of the lumbar spine.   Electronically Signed   By: Oneil Devonshire M.D.   On: 05/04/2022 00:20  PAIN:    Pain Intensity: Present: 0/10, Best: 0/10, Worst: 8/10 Pain location: Low Back  Pain Quality: intermittent and shooting  Radiating: Yes  Numbness/Tingling: Yes Focal Weakness: Yes 24-hour pain behavior: Activity Dependent  History of prior back injury, pain, surgery, or therapy: Yes Dominant hand: right Imaging: Yes   PRECAUTIONS: Fall  WEIGHT BEARING RESTRICTIONS: No  FALLS: Has patient fallen in last 6 months? No  Living Environment Lives with: lives with their family Lives in: House/apartment Stairs: No Has following equipment at home: None  Prior level of function: Independent  Hobbies: Reading   Patient Goals: Patient would like to reduce pain    OBJECTIVE:  Patient Surveys  Modified Oswestry 14 / 50 = 28.0 %   Cognition Patient is oriented to person, place, and time.  Recent memory is intact.  Remote memory is intact.  Attention span and concentration are intact.  Expressive speech is intact.  Patient's fund of knowledge is  within normal limits for educational level.    Gross Musculoskeletal Assessment Tremor: None Bulk: Normal Tone: Normal  GAIT: Distance walked: 57m Assistive device utilized: None Level of assistance: Complete Independence Comments: WNL   Posture: Lumbar lordosis: WNL Iliac crest height: Equal bilaterally Lumbar lateral shift: Negative  AROM AROM (Normal range in degrees) AROM   Lumbar   Flexion (65) 100%  Extension (30) 100%  Right lateral flexion (25) 100%  Left lateral flexion (25) 100%  Right rotation (30) 100%  Left rotation (30) 100%      Hip Right Left  Flexion (125) WNL WNL  Extension (15)    Abduction (40)    Adduction     Internal Rotation (45) WNL WNL  External Rotation (45) WNL WNL      Knee    Flexion (135)    Extension (0)        Ankle    Dorsiflexion (20)    Plantarflexion (50)    Inversion (35)    Eversion (15)    (* = pain; Blank rows = not tested)  LE MMT: MMT (out of 5) Right  Left*  Hip flexion 4 4  Hip extension    Hip abduction 4- 4-  Hip adduction    Hip internal rotation 4 4-  Hip external rotation 4 4-  Knee flexion 5 5  Knee extension 5 5  Ankle dorsiflexion 5 5  Ankle plantarflexion 5 5  Ankle inversion    Ankle eversion    (* = pain; Blank rows = not tested)  Sensation Grossly intact to light touch throughout bilateral LEs as determined by testing dermatomes L2-S2. Proprioception, stereognosis, and hot/cold testing deferred on this date.  Reflexes R/L Knee Jerk (L3/4): 2+/2+  Ankle Jerk (S1/2): 2+/2+   Muscle Length Hamstrings (90-90 Test): R: Positive for tightness 40 knee ext L: Positive for tightness 55 knee ext  Palpation Location Right Left         Lumbar paraspinals 1 0  Quadratus Lumborum 0 0  Gluteus Maximus 0 0  Gluteus Medius 1 1  Deep hip external rotators 0 1  PSIS 0 0  Fortin's Area (SIJ) 0 0  Greater Trochanter 0 0  (Blank rows = not tested) Graded on 0-4 scale (0 = no pain, 1 = pain, 2  = pain with wincing/grimacing/flinching, 3 = pain with withdrawal, 4 = unwilling to allow palpation)  Passive Accessory Intervertebral Motion Pt reports reproduction of concordant pain at L4-S1, each segment and doesn't improve with continued CPA.   Special Tests Lumbar Radiculopathy and Discogenic: SLR (SN 92, -LR 0.29): R: Negative L:  Negative Crossed SLR (SP 90): R: Negative L: Negative  Hip: FABER (SN 81): R: Positive for tightness L: Negative FADIR (SN 94): R: Negative L: Positive for concordant pain  Piriformis Syndrome: FAIR Test (SN 88, SP 83):  L: Positive for Pain in hip   FUNCTIONAL TESTS:  30s Sit to Stand Test: 13 Reps  Lateral Step Down: R: Hip IR/ADD, L: Negative  Supine Bridge: B Single Leg Bridge: R/L: Unable to maintain elevated/ neutral pelvis  TODAY'S TREATMENT: DATE: 09/06/2023  Subjective: Patient reports moderate to severe fatigue due to lack of sleep. 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 Only getting 4 hours of restful sleep. Currently no lumbar pain unless there is direct palpation. She reports some sciatic related symptom in the R leg. No questions or concerns.  Therapeutic Exercise (focused on lumbar mobility and core stabilization):  NuStep L6-2x 5 min x LE x > 100 SPM (Seat 7) for LE warm up, endurance and strength; PT manually adjusted resistance throughout bout.   Hip Matrix  Hip Abduction   R/L: 3 x 10, 40#   Supine Nerve Glides   2 x 10 - increased hamstring flexibility at 90 hip flexion  Supine Double Leg lift into extension for core stability  2 x 10 with higher leg extension  1 x 10, lower LE extension with PT assist at the feet (minor pain in the last repetition)   Dead Bug   2 x 10, alternating LE/UE    1 x 10, alterating LE only with Red Tb around feet   1 x 10, alterating LE only with Red Tb around feet and alternating UE     Therapeutic Activity:    Kettle Bell Squat    1 x 10, 20#    1  x 10, 30# KB   1 x 5, 30# KB   Lateral Squat Stepping against resistance  4 x 25', Green TB    Lateral Step Down with SUE support  R/L: 2 x 10 ea leg, no knee valgus present throughout intervention.   PATIENT EDU CATION:  Education details:  Exercise Technique    Person educated: Patient Education method: Medical illustrator Education comprehension: verbalized understanding and returned demonstration   HOME EXERCISE PROGRAM:   Access Code: [email or web address] URL: [email or web address] Date: 08/28/2023 Prepared by: Lonni Lynn Sissel  Exercises - Supine Piriformis Stretch with Foot on Ground  - 2 x daily - 7 x weekly - 30-60s hold - Seated Hamstring Stretch  - 2 x daily - 7 x weekly - 30-60s hold - Seated Piriformis Stretch  - 2 x daily - 7 x weekly - 30-60s hold - Supine Bridge with Resistance Band  - 1 x daily - 7 x weekly - 2-3 sets - 10-12 reps - Supine Dead Bug with Leg Extension  - 1 x daily - 3-4 x weekly - 2-3 sets - 8-10 reps - Side Stepping with Resistance at Ankles  - 1 x daily - 3-4 x weekly - 2-3 sets - 10-12 reps - Clamshell with Resistance  - 1 x daily - 3-4 x weekly - 2-3 sets - 10-12 reps - Seated Thoracic Lumbar Extension  - 1 x daily - 4-5 x weekly - 3 sets - 15-20 reps - Squat with Chair Touch and Resistance Loop  - 1 x daily - 3-4 x weekly - 2-3 sets - 10-12 reps  ASSESSMENT:  CLINICAL IMPRESSION: Continued PT POC with a continued focus on improving lower back pain and decreasing sciatic related symptoms. Pt demonstrated improved sciatic nerve glide flexibility; able to achieve near TKE at 90 hip flexion without exacerbation of pain. She continues to demonstrate improvements in functional strength; able to squat 30# KB with proper TrA activation. She still continues to lack core stability endurance requiring tactile abdominal bracing from PT in order to complete set. PT will continue to progress core strengthening and functional strength in order  to support functional goals and minimize recurrence of low back discomfort. Based on today's performance patient will continue to benefit from skilled physical therapy in order to improve QoL and facilitate return to PLOF.   OBJECTIVE IMPAIRMENTS: decreased activity tolerance, decreased endurance, difficulty walking, decreased strength, and pain.   ACTIVITY LIMITATIONS: lifting, bending, sitting, standing, and transfers  PARTICIPATION LIMITATIONS: community activity and walking pet  PERSONAL FACTORS: Age, Past/current experiences, Profession, and 1 comorbidity: benign joint hypermobility are also affecting patient's functional outcome.   REHAB POTENTIAL: Good  CLINICAL DECISION MAKING: Evolving/moderate complexity  EVALUATION COMPLEXITY: Moderate   GOALS: Goals reviewed with patient? Yes  SHORT TERM GOALS: Target date: 10/18/2023  Pt will be independent with HEP in order to improve strength and decrease back pain to improve pain-free function at home and work. Baseline: 06/26/23: Initial provided  Goal status: INITIAL   LONG TERM GOALS: Target date: 11/29/2023  Pt will report ability to walk her dog around her neighborhood more than 1 Loop (~1 mi) without report of additional pain in order to demonstrate reduction in back pain and improvements in muscular endurance   Baseline: 06/26/2023: 1 Lap; 07/20/2023: 2 Laps  Goal status: Progressing   2.  Pt will decrease worst back pain by at least 2 points on the NPRS in order to demonstrate clinically significant reduction in back pain. Baseline: 06/26/2023: 8/10 NRPS; 07/20/2023: 3/10 NPS; 07/27/2023: 6/10 NPS.  Goal status: Progressing  3.  Pt will decrease mODI score by at least 13 points in order demonstrate clinically significant reduction in back pain/disability.  Baseline: 14 / 50 = 28.0 %; 13 / 50 = 26.0 % Goal status: Progressing  4.  Pt will increase 30 second sit to stand test from 13 to 18 repetitions (+5) in order  to demonstrate clinically significant improvements in LE strength and endurance.  Baseline: 06/26/2023: 13; 07/27/2023: 17.5 Goal status: Progressing   5.  Pt will improve lateral step down without R hip adduction/IR to demonstrate improved hip stability and motor control for pain free recreational activities.  Baseline: 06/26/2023: Moderate R hip add/IR; 07/27/2023: minor R hip add/ir with increased reps (~8) Goal status: Progressing   PLAN: PT FREQUENCY: 1-2x/week  PT DURATION: 12 weeks  PLANNED INTERVENTIONS: Therapeutic exercises, Therapeutic activity, Neuromuscular re-education, Balance training, Gait training, Patient/Family education, Self Care, Joint mobilization, Joint manipulation, Dry Needling, Electrical stimulation, Spinal manipulation, Spinal mobilization, Cryotherapy, Moist heat, Manual therapy, and Re-evaluation.  PLAN FOR NEXT SESSION:  Progress Hip and Core Stabilization exercises. Lumbar mobility (extension based). Functional strengthening.    Lonni Pall PT, DPT Physical Therapist- [facility]  09/06/2023, 7:14 PM

## 2023-09-08 ENCOUNTER — Ambulatory Visit (INDEPENDENT_AMBULATORY_CARE_PROVIDER_SITE_OTHER)

## 2023-09-08 VITALS — BP 114/74 | HR 84 | Temp 97.8°F | Resp 18 | Ht 61.0 in | Wt 164.0 lb

## 2023-09-08 DIAGNOSIS — D509 Iron deficiency anemia, unspecified: Secondary | ICD-10-CM | POA: Diagnosis not present

## 2023-09-08 MED ORDER — IRON SUCROSE 20 MG/ML IV SOLN
300.0000 mg | Freq: Once | INTRAVENOUS | Status: AC
  Administered 2023-09-08: 300 mg via INTRAVENOUS
  Filled 2023-09-08: qty 15

## 2023-09-08 NOTE — Progress Notes (Signed)
 Diagnosis: Iron  Deficiency Anemia  Provider:  Praveen Mannam MD  Procedure: IV Infusion  IV Type: Peripheral, IV Location: R Forearm  Venofer  (Iron  Sucrose), Dose: 300 mg  Infusion Start Time: 0836  Infusion Stop Time: 1014  Post Infusion IV Care: Patient declined observation and Peripheral IV Discontinued  Discharge: Condition: Good, Destination: Home . AVS Declined  Performed by:  Leita FORBES Miles, LPN

## 2023-09-12 ENCOUNTER — Ambulatory Visit: Payer: Self-pay

## 2023-09-12 DIAGNOSIS — M5459 Other low back pain: Secondary | ICD-10-CM | POA: Diagnosis not present

## 2023-09-12 DIAGNOSIS — M6281 Muscle weakness (generalized): Secondary | ICD-10-CM

## 2023-09-12 DIAGNOSIS — M25552 Pain in left hip: Secondary | ICD-10-CM

## 2023-09-12 NOTE — Therapy (Signed)
 OUTPATIENT PHYSICAL THERAPY THORACOLUMBAR TREATMENT     Patient Name: Alicia Carr MRN: [id number] DOB:1980/10/14, 43 y.o., female Today's Date: 09/12/2023  END OF SESSION:  PT End of Session - 09/12/23 0820     Visit Number 19    Number of Visits 25    Date for PT Re-Evaluation 09/18/23    Authorization Type [employer] 2025  VL:max combined 30 PT/OT/SLP    Authorization - Number of Visits 30    Progress Note Due on Visit 20    PT Start Time 0820    PT Stop Time 0900    PT Time Calculation (min) 40 min    Activity Tolerance Patient limited by fatigue    Behavior During Therapy [facility] for tasks assessed/performed             Past Medical History:  Diagnosis Date   Alcoholism (HCC)    Anemia    Early hepatic fibrosis    Family history of ovarian cancer    Family history of prostate cancer    Gallstones    Genetic testing 11/29/2021   History of chicken pox    Hyperpigmentation    Longitudinal melanonychia    Nevus    right dorsal wrist   Positive ANA (antinuclear antibody)    Primary sclerosing cholangitis    Pruritus    Raynaud's syndrome    Shingles 07/18/2020   Vaginal delivery 2003   Past Surgical History:  Procedure Laterality Date   BILATERAL SALPINGECTOMY  12/03/2021   at time of RSO   CESAREAN SECTION  08/08/2006   CHOLECYSTECTOMY  2003   COLONOSCOPY  09/2020   CYSTOSCOPY  10/17/2022   [facility], Microscopic Hematuria, Repeat Micro UA in 1  year.   LIVER BIOPSY  05/2020   ROBOTIC ASSISTED SALPINGO OOPHERECTOMY Bilateral 12/03/2021   Procedure: XI ROBOTIC ASSISTED BILATERAL SALPINGECTOMY AND RIGHT OOPHORECTOMY;  Surgeon: Mace Garnette BIRCH, MD;  Location: [facility] ORS;  Service: Gynecology;  Laterality: Bilateral;   TUBAL LIGATION  2023   Tubes were actually removed due to cancer  gene mutation   Patient Active Problem List   Diagnosis Date Noted   Undifferentiated connective tissue disease (HCC) 08/15/2023   Angular cheilitis 08/15/2023    IDA (iron  deficiency anemia) 08/10/2023   Benign joint hypermobility 05/10/2023   Vitamin D  deficiency 05/10/2023   Hepatic steatosis 08/17/2022   Right ovarian cyst 12/03/2021   Monoallelic mutation of RAD51C gene 11/30/2021   Genetic testing 11/29/2021   Family history of prostate cancer 11/04/2021   Family history of ovarian cancer 11/04/2021   Dermoid cyst of right ovary 10/28/2021   Intolerant of cold 08/02/2021   Menorrhagia 08/02/2021   Folliculitis 06/10/2021   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 cause 06/18/2020    PCP: Job Lukes, PA  REFERRING PROVIDER: Jeannetta Lonni LELON, MD  REFERRING DIAG:  M54.50,G89.29 (ICD-10-CM) - Chronic bilateral low back pain without sciatica    RATIONALE FOR EVALUATION AND TREATMENT: Rehabilitation  THERAPY DIAG: Other low back pain  Muscle weakness (generalized)  Pain in left hip  ONSET DATE: Chronic > 1 yr  FOLLOW-UP APPT SCHEDULED WITH REFERRING PROVIDER: Not addressed    SUBJECTIVE:  SUBJECTIVE STATEMENT:  Intermittent diffuse Low Back Pain   PERTINENT HISTORY:   Pt reports to OPPT with chief concern of chronic ow back pain > 1 year.  She reports that sometimes she will stand up from a chair and she will experience diffuse low back pain. She reports that intense episodes of LBP will include intermittent shooting pain deep into her thigh. Patient reports focal weakness in both of her legs secondary to intense episodes. 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 Pain aggravated with transitional movements: getting up from a chair, tying a shoe, bending to pick up items. She reports prolonged sitting in an uncomfortable surface (harder chair). She denies b/b changes, saddle paresthesia, abdominal pain, chills/fever, night sweats, nausea, vomiting, unrelenting pain.   Imaging (Per Chart Review 05/02/2022):  CLINICAL DATA:  Low back and left hip pain for several weeks, no known injury, initial encounter   EXAM: LUMBAR SPINE - 3 VIEW   COMPARISON:  None Available.   FINDINGS: Five lumbar type vertebral bodies are well visualized. Vertebral body height is well maintained. Minimal osteophytic changes are noted. No anterolisthesis is seen. No soft tissue abnormality is noted.  IMPRESSION: Mild degenerative changes of the lumbar spine.   Electronically Signed   By: Oneil Devonshire M.D.   On: 05/04/2022 00:20  PAIN:    Pain Intensity: Present: 0/10, Best: 0/10, Worst: 8/10 Pain location: Low Back  Pain Quality: intermittent and shooting  Radiating: Yes  Numbness/Tingling: Yes Focal Weakness: Yes 24-hour pain behavior: Activity Dependent  History of prior back injury, pain, surgery, or therapy: Yes Dominant hand: right Imaging: Yes   PRECAUTIONS: Fall  WEIGHT BEARING RESTRICTIONS: No  FALLS: Has patient fallen in last 6 months? No  Living Environment Lives with: lives with their family Lives in: House/apartment Stairs: No Has following equipment at home: None  Prior level of function: Independent  Hobbies: Reading   Patient Goals: Patient would like to reduce pain    OBJECTIVE:  Patient Surveys  Modified Oswestry 14 / 50 = 28.0 %   Cognition Patient is oriented to person, place, and time.  Recent memory is intact.  Remote memory is intact.  Attention span and concentration are intact.  Expressive speech is intact.  Patient's fund of knowledge is within normal limits for  educational level.    Gross Musculoskeletal Assessment Tremor: None Bulk: Normal Tone: Normal  GAIT: Distance walked: 20m Assistive device utilized: None Level of assistance: Complete Independence Comments: WNL   Posture: Lumbar lordosis: WNL Iliac crest height: Equal bilaterally Lumbar lateral shift: Negative  AROM AROM (Normal range in degrees) AROM   Lumbar   Flexion (65) 100%  Extension (30) 100%  Right lateral flexion (25) 100%  Left lateral flexion (25) 100%  Right rotation (30) 100%  Left rotation (30) 100%      Hip Right Left  Flexion (125) WNL WNL  Extension (15)    Abduction (40)    Adduction     Internal Rotation (45) WNL WNL  External Rotation (45) WNL WNL      Knee    Flexion (135)    Extension (0)        Ankle    Dorsiflexion (20)    Plantarflexion (50)    Inversion (35)    Eversion (15)    (* = pain; Blank rows = not tested)  LE MMT: MMT (out of 5) Right  Left*  Hip flexion 4 4  Hip extension    Hip abduction 4- 4-  Hip adduction    Hip internal rotation 4 4-  Hip external rotation 4 4-  Knee flexion 5 5  Knee extension 5 5  Ankle dorsiflexion 5 5  Ankle plantarflexion 5 5  Ankle inversion    Ankle eversion    (* = pain; Blank rows = not tested)  Sensation Grossly intact to light touch throughout bilateral LEs as determined by testing dermatomes L2-S2. Proprioception, stereognosis, and hot/cold testing deferred on this date.  Reflexes R/L Knee Jerk (L3/4): 2+/2+  Ankle Jerk (S1/2): 2+/2+   Muscle Length Hamstrings (90-90 Test): R: Positive for tightness 40 knee ext L: Positive for tightness 55 knee ext  Palpation Location Right Left         Lumbar paraspinals 1 0  Quadratus Lumborum 0 0  Gluteus Maximus 0 0  Gluteus Medius 1 1  Deep hip external rotators 0 1  PSIS 0 0  Fortin's Area (SIJ) 0 0  Greater Trochanter 0 0  (Blank rows = not tested) Graded on 0-4 scale (0 = no pain, 1 = pain, 2 = pain with  wincing/grimacing/flinching, 3 = pain with withdrawal, 4 = unwilling to allow palpation)  Passive Accessory Intervertebral Motion Pt reports reproduction of concordant pain at L4-S1, each segment and doesn't improve with continued CPA.   Special Tests Lumbar Radiculopathy and Discogenic: SLR (SN 92, -LR 0.29): R: Negative L:  Negative Crossed SLR (SP 90): R: Negative L: Negative  Hip: FABER (SN 81): R: Positive for tightness L: Negative FADIR (SN 94): R: Negative L: Positive for concordant pain  Piriformis Syndrome: FAIR Test (SN 88, SP 83):  L: Positive for Pain in hip   FUNCTIONAL TESTS:  30s Sit to Stand Test: 13 Reps  Lateral Step Down: R: Hip IR/ADD, L: Negative  Supine Bridge: B Single Leg Bridge: R/L: Unable to maintain elevated/ neutral pelvis  TODAY'S TREATMENT: DATE: 09/12/2023  Subjective: Patient reports that she walked the dog yesterday (1 mi) and experienced some minor hip pain (1-2/10 NPS). Currently she reports 1/10 in the lower back without direct palpation. No questions or concerns.  Therapeutic Exercise (focused on lumbar mobility and core stabilization):  NuStep L6-2x 5 min x LE x > 100 SPM (Seat 6) for LE warm up, endurance and strength; PT manually adjusted resistance throughout bout.   Hip Matrix  Hip Abduction   R/L: 3 x 10, 40#    Hip Flexion   R/L: 3 x 10, 40#   Supine 90-90 with LE extension into a Red TB   3 x 10, Alternating LE with TrA activation   Supine Hip Flexion March against resistance   2 x 10 Alternating LE, Red TB     Supine Single UE OH reach with opposite LE Extension    3 x 10, SUE with 1 Kg Medball    Side Plank  To improve core stability     3 x 10s hold   Modified Forearm Plank on Knees   3 x 10s hold    PATIENT EDUCATION:  Education details:  Exercise Technique    Person educated: Patient Education method: Medical illustrator Education comprehension: verbalized understanding and returned  demonstration   HOME EXERCISE PROGRAM:   Access Code: [email or web address] URL: [email or web address] Date: 09/12/2023 Prepared by: Lonni Lorelei Heikkila  Exercises - Supine Piriformis Stretch with Foot on Ground  - 2 x daily - 7 x weekly - 30-60s hold -  Seated Hamstring Stretch  - 2 x daily - 7 x weekly - 30-60s hold - Seated Piriformis Stretch  - 2 x daily - 7 x weekly - 30-60s hold - Squat with Chair Touch and Resistance Loop  - 1 x daily - 3-4 x weekly - 2-3 sets - 10-12 reps - Seated Thoracic Lumbar Extension  - 1 x daily - 4-5 x weekly - 3 sets - 15-20 reps - Supine Bridge with Resistance Band  - 1 x daily - 7 x weekly - 2-3 sets - 10-12 reps - Supine Dead Bug with Leg Extension  - 1 x daily - 3-4 x weekly - 2-3 sets - 8-10 reps - Side Stepping with Resistance at Ankles  - 1 x daily - 3-4 x weekly - 2-3 sets - 10-12 reps - Clamshell with Resistance  - 1 x daily - 3-4 x weekly - 2-3 sets - 10-12 reps - Supine 90/90 with Leg Extensions  - 1 x daily - 3-4 x weekly - 2-3 sets - 10-12 reps - Side Plank on Knees  - 1 x daily - 3-4 x weekly - 3 sets - 10-15s hold - Kneeling Plank on Forearms with Scapular Protraction Retraction AROM  - 1 x daily - 3-4 x weekly - 3 sets - 10-15s hold  ASSESSMENT:  CLINICAL IMPRESSION: Continued PT POC with a continued focus on improving lower back pain and decreasing sciatic related symptoms. PT focused on hip/gluteal strengthening in order to improve walking distance and decrease hip pain with prolonged walking/driving. Additional core endurance strengthening targeted today with plank variation. She endorsed adherence to HEP with supine position or exercises in bed; Updated HEP with additional core exercises. PT will continue to progress core strengthening and functional strength in order to support functional goals and minimize recurrence of low back discomfort. Reassessment of progress towards PT and personal goals in next session. Based on today's performance  patient will continue to benefit from skilled physical therapy in order to improve QoL and facilitate return to PLOF.   OBJECTIVE IMPAIRMENTS: decreased activity tolerance, decreased endurance, difficulty walking, decreased strength, and pain.   ACTIVITY LIMITATIONS: lifting, bending, sitting, standing, and transfers  PARTICIPATION LIMITATIONS: community activity and walking pet  PERSONAL FACTORS: Age, Past/current experiences, Profession, and 1 comorbidity: benign joint hypermobility are also affecting patient's functional outcome.   REHAB POTENTIAL: Good  CLINICAL DECISION MAKING: Evolving/moderate complexity  EVALUATION COMPLEXITY: Moderate   GOALS: Goals reviewed with patient? Yes  SHORT TERM GOALS: Target date: 10/24/2023  Pt will be independent with HEP in order to improve strength and decrease back pain to improve pain-free function at home and work. Baseline: 06/26/23: Initial provided  Goal status: INITIAL   LONG TERM GOALS: Target date: 12/05/2023  Pt will report ability to walk her dog around her neighborhood more than 1 Loop (~1 mi) without report of additional pain in order to demonstrate reduction in back pain and improvements in muscular endurance   Baseline: 06/26/2023: 1 Lap; 07/20/2023: 2 Laps  Goal status: Progressing   2.  Pt will decrease worst back pain by at least 2 points on the NPRS in order to demonstrate clinically significant reduction in back pain. Baseline: 06/26/2023: 8/10 NRPS; 07/20/2023: 3/10 NPS; 07/27/2023: 6/10 NPS.  Goal status: Progressing  3.  Pt will decrease mODI score by at least 13 points in order demonstrate clinically significant reduction in back pain/disability.       Baseline: 14 / 50 = 28.0 %; 13 / 50 =  26.0 % Goal status: Progressing  4.  Pt will increase 30 second sit to stand test from 13 to 18 repetitions (+5) in order to demonstrate clinically significant improvements in LE strength and endurance.  Baseline: 06/26/2023:  13; 07/27/2023: 17.5 Goal status: Progressing   5.  Pt will improve lateral step down without R hip adduction/IR to demonstrate improved hip stability and motor control for pain free recreational activities.  Baseline: 06/26/2023: Moderate R hip add/IR; 07/27/2023: minor R hip add/ir with increased reps (~8) Goal status: Progressing   PLAN: PT FREQUENCY: 1-2x/week  PT DURATION: 12 weeks  PLANNED INTERVENTIONS: Therapeutic exercises, Therapeutic activity, Neuromuscular re-education, Balance training, Gait training, Patient/Family education, Self Care, Joint mobilization, Joint manipulation, Dry Needling, Electrical stimulation, Spinal manipulation, Spinal mobilization, Cryotherapy, Moist heat, Manual therapy, and Re-evaluation.  PLAN FOR NEXT SESSION:  Progress Hip and Core Stabilization exercises. Lumbar mobility (extension based). Functional strengthening.    Lonni Pall PT, DPT Physical Therapist- [facility]  09/12/2023, 8:31 AM

## 2023-09-14 ENCOUNTER — Ambulatory Visit: Payer: Self-pay

## 2023-09-19 ENCOUNTER — Encounter

## 2023-09-21 ENCOUNTER — Encounter

## 2023-09-26 ENCOUNTER — Ambulatory Visit

## 2023-09-26 DIAGNOSIS — M25552 Pain in left hip: Secondary | ICD-10-CM

## 2023-09-26 DIAGNOSIS — M5459 Other low back pain: Secondary | ICD-10-CM

## 2023-09-26 DIAGNOSIS — M6281 Muscle weakness (generalized): Secondary | ICD-10-CM

## 2023-09-26 NOTE — Therapy (Signed)
 OUTPATIENT PHYSICAL THERAPY THORACOLUMBAR TREATMENT/RECERTIFICATION/PROGRESS NOTE     Patient Name: Alicia Carr MRN: [id number] DOB:12-Oct-1980, 43 y.o., female Today's Date: 09/26/2023  END OF SESSION:  PT End of Session - 09/26/23 1731     Visit Number 20    Number of Visits 25    Date for PT Re-Evaluation 10/05/23    Authorization Type [employer] 2025  VL:max combined 30 PT/OT/SLP    Authorization - Number of Visits 30    Progress Note Due on Visit 20    PT Start Time 1730    PT Stop Time 1810    PT Time Calculation (min) 40 min    Activity Tolerance Patient limited by fatigue    Behavior During Therapy [facility] for tasks assessed/performed             Past Medical History:  Diagnosis Date   Alcoholism (HCC)    Anemia    Early hepatic fibrosis    Family history of ovarian cancer    Family history of prostate cancer    Gallstones    Genetic testing 11/29/2021   History of chicken pox    Hyperpigmentation    Longitudinal melanonychia    Nevus    right dorsal wrist   Positive ANA (antinuclear antibody)    Primary sclerosing cholangitis    Pruritus    Raynaud's syndrome    Shingles 07/18/2020   Vaginal delivery 2003   Past Surgical History:  Procedure Laterality Date   BILATERAL SALPINGECTOMY  12/03/2021   at time of RSO   CESAREAN SECTION  08/08/2006   CHOLECYSTECTOMY  2003   COLONOSCOPY  09/2020   CYSTOSCOPY  10/17/2022   [facility], Microscopic Hematuria, Repeat Micro UA in 1  year.   LIVER BIOPSY  05/2020   ROBOTIC ASSISTED SALPINGO OOPHERECTOMY Bilateral 12/03/2021   Procedure: XI ROBOTIC ASSISTED BILATERAL SALPINGECTOMY AND RIGHT OOPHORECTOMY;  Surgeon: Mace Garnette BIRCH, MD;  Location: [facility];  Service: Gynecology;  Laterality: Bilateral;   TUBAL LIGATION  2023   Tubes were actually removed due to cancer  gene mutation   Patient Active Problem List   Diagnosis Date Noted   Undifferentiated connective tissue disease (HCC) 08/15/2023    Angular cheilitis 08/15/2023   IDA (iron  deficiency anemia) 08/10/2023   Benign joint hypermobility 05/10/2023   Vitamin D  deficiency 05/10/2023   Hepatic steatosis 08/17/2022   Right ovarian cyst 12/03/2021   Monoallelic mutation of RAD51C gene 11/30/2021   Genetic testing 11/29/2021   Family history of prostate cancer 11/04/2021   Family history of ovarian cancer 11/04/2021   Dermoid cyst of right ovary 10/28/2021   Intolerant of cold 08/02/2021   Menorrhagia 08/02/2021   Folliculitis 06/10/2021   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 cause 06/18/2020    PCP: Job Lukes, PA  REFERRING PROVIDER: Jeannetta Lonni LELON, MD  REFERRING DIAG:  M54.50,G89.29 (ICD-10-CM) - Chronic bilateral low back pain without sciatica    RATIONALE FOR EVALUATION AND TREATMENT: Rehabilitation  THERAPY DIAG: Other low back pain  Muscle weakness (generalized)  Pain in left hip  ONSET DATE: Chronic > 1 yr  FOLLOW-UP APPT SCHEDULED WITH REFERRING PROVIDER: Not addressed    SUBJECTIVE:  SUBJECTIVE STATEMENT:  Intermittent diffuse Low Back Pain   PERTINENT HISTORY:   Pt reports to OPPT with chief concern of chronic ow back pain > 1 year.  She reports that sometimes she will stand up from a chair and she will experience diffuse low back pain. She reports that intense episodes of LBP will include intermittent shooting pain deep into her thigh. Patient reports focal weakness in both of her legs secondary to intense episodes. 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 Pain aggravated with transitional movements: getting up from a chair, tying a shoe, bending to pick up items. She reports prolonged sitting in an uncomfortable surface (harder chair). She denies b/b changes, saddle paresthesia, abdominal pain, chills/fever, night sweats, nausea, vomiting, unrelenting pain.   Imaging (Per Chart Review 05/02/2022):  CLINICAL DATA:  Low back and left hip pain for several weeks, no known injury, initial encounter   EXAM: LUMBAR SPINE - 3 VIEW   COMPARISON:  None Available.   FINDINGS: Five lumbar type vertebral bodies are well visualized. Vertebral body height is well maintained. Minimal osteophytic changes are noted. No anterolisthesis is seen. No soft tissue abnormality is noted.  IMPRESSION: Mild degenerative changes of the lumbar spine.   Electronically Signed   By: Oneil Devonshire M.D.   On: 05/04/2022 00:20  PAIN:    Pain Intensity: Present: 0/10, Best: 0/10, Worst: 8/10 Pain location: Low Back  Pain Quality: intermittent and shooting  Radiating: Yes  Numbness/Tingling: Yes Focal Weakness: Yes 24-hour pain behavior: Activity Dependent  History of prior back injury, pain, surgery, or therapy: Yes Dominant hand: right Imaging: Yes   PRECAUTIONS: Fall  WEIGHT BEARING RESTRICTIONS: No  FALLS: Has patient fallen in last 6 months? No  Living Environment Lives with: lives with their family Lives in: House/apartment Stairs: No Has following equipment at home: None  Prior level of function: Independent  Hobbies: Reading   Patient Goals: Patient would like to reduce pain    OBJECTIVE:  Patient Surveys  Modified Oswestry 14 / 50 = 28.0 %   Cognition Patient is oriented to person, place, and time.  Recent memory is intact.  Remote memory is intact.  Attention span and concentration are intact.  Expressive speech is intact.  Patient's fund of knowledge is  within normal limits for educational level.    Gross Musculoskeletal Assessment Tremor: None Bulk: Normal Tone: Normal  GAIT: Distance walked: 55m Assistive device utilized: None Level of assistance: Complete Independence Comments: WNL   Posture: Lumbar lordosis: WNL Iliac crest height: Equal bilaterally Lumbar lateral shift: Negative  AROM AROM (Normal range in degrees) AROM   Lumbar   Flexion (65) 100%  Extension (30) 100%  Right lateral flexion (25) 100%  Left lateral flexion (25) 100%  Right rotation (30) 100%  Left rotation (30) 100%      Hip Right Left  Flexion (125) WNL WNL  Extension (15)    Abduction (40)    Adduction     Internal Rotation (45) WNL WNL  External Rotation (45) WNL WNL      Knee    Flexion (135)    Extension (0)        Ankle    Dorsiflexion (20)    Plantarflexion (50)    Inversion (35)    Eversion (15)    (* = pain; Blank rows = not tested)  LE MMT: MMT (out of 5) Right  Left*  Hip flexion 4 4  Hip extension    Hip abduction 4- 4-  Hip adduction    Hip internal rotation 4 4-  Hip external rotation 4 4-  Knee flexion 5 5  Knee extension 5 5  Ankle dorsiflexion 5 5  Ankle plantarflexion 5 5  Ankle inversion    Ankle eversion    (* = pain; Blank rows = not tested)  Sensation Grossly intact to light touch throughout bilateral LEs as determined by testing dermatomes L2-S2. Proprioception, stereognosis, and hot/cold testing deferred on this date.  Reflexes R/L Knee Jerk (L3/4): 2+/2+  Ankle Jerk (S1/2): 2+/2+   Muscle Length Hamstrings (90-90 Test): R: Positive for tightness 40 knee ext L: Positive for tightness 55 knee ext  Palpation Location Right Left         Lumbar paraspinals 1 0  Quadratus Lumborum 0 0  Gluteus Maximus 0 0  Gluteus Medius 1 1  Deep hip external rotators 0 1  PSIS 0 0  Fortin's Area (SIJ) 0 0  Greater Trochanter 0 0  (Blank rows = not tested) Graded on 0-4 scale (0 = no pain, 1 = pain, 2  = pain with wincing/grimacing/flinching, 3 = pain with withdrawal, 4 = unwilling to allow palpation)  Passive Accessory Intervertebral Motion Pt reports reproduction of concordant pain at L4-S1, each segment and doesn't improve with continued CPA.   Special Tests Lumbar Radiculopathy and Discogenic: SLR (SN 92, -LR 0.29): R: Negative L:  Negative Crossed SLR (SP 90): R: Negative L: Negative  Hip: FABER (SN 81): R: Positive for tightness L: Negative FADIR (SN 94): R: Negative L: Positive for concordant pain  Piriformis Syndrome: FAIR Test (SN 88, SP 83):  L: Positive for Pain in hip   FUNCTIONAL TESTS:  30s Sit to Stand Test: 13 Reps  Lateral Step Down: R: Hip IR/ADD, L: Negative  Supine Bridge: B Single Leg Bridge: R/L: Unable to maintain elevated/ neutral pelvis  TODAY'S TREATMENT: DATE: 09/26/2023  Subjective: Patient reports no lower back pain unless direct pressure applied to spine (L2-L4 level). Patient reports that she was able to complete vacation bible school activities all last week without lower back pain. Patient without sciatica related symptoms within the last two weeks.  No questions or concerns.  Therapeutic Exercise (focused on lumbar mobility and core stabilization):  NuStep L6-2 x 3 min x LE x > 100 SPM (Seat 6) for LE warm up, endurance and strength; PT manually adjusted resistance throughout bout.   30s STS: 23  Supine 90/90 with OH reach using Med ball   1 x 10 - 2 Kg MB   1 x 10 - 3 Kg MB    Dead Bug - Alternate LE/UE   2 x 12    1 x 15 - minor lower back pain with increased repetitions    Seated Pallof Press against resistance    3 x 10 - Grey TB     Therapeutic Activity:  Lateral Step Down  R/L: 2 x 10 reps   Air Squats   1 x 10   Squatting Against resistance  1 x 10, Blue TB around lower legs   Kettle Bell Squats   2 x 10, 20# - Blue TB around lower leg    PATIENT EDUCATION:  Education details:  Exercise Technique    Person  educated: Patient Education method: Medical illustrator Education comprehension: verbalized understanding and returned demonstration   HOME EXERCISE PROGRAM:   Access Code: [email or web address] URL:  [email or web address] Date: 09/26/2023 Prepared by: Lonni Kenner Lewan  Exercises - Supine Piriformis Stretch with Foot on Ground  - 2 x daily - 7 x weekly - 30-60s hold - Supine Sciatic Nerve Glide  - 1 x daily - 7 x weekly - 2-3 sets - 10 reps - Seated Hamstring Stretch  - 2 x daily - 7 x weekly - 30-60s hold - Seated Piriformis Stretch  - 2 x daily - 7 x weekly - 30-60s hold - Squat with Chair Touch and Resistance Loop  - 1 x daily - 3-4 x weekly - 2-3 sets - 10-12 reps - Seated Thoracic Lumbar Extension  - 1 x daily - 4-5 x weekly - 3 sets - 15-20 reps - Supine Dead Bug with Leg Extension  - 1 x daily - 3-4 x weekly - 2-3 sets - 8-10 reps - Side Stepping with Resistance at Ankles  - 1 x daily - 3-4 x weekly - 2-3 sets - 10-12 reps - Clamshell with Resistance  - 1 x daily - 3-4 x weekly - 2-3 sets - 10-12 reps - Supine 90/90 with Leg Extensions  - 1 x daily - 3-4 x weekly - 2-3 sets - 10-12 reps - Side Plank on Knees  - 1 x daily - 3-4 x weekly - 3 sets - 10-15s hold - Kneeling Plank on Forearms with Scapular Protraction Retraction AROM  - 1 x daily - 3-4 x weekly - 3 sets - 10-15s hold - Standing Anti-Rotation Press with Anchored Resistance  - 1 x daily - 2-3 x weekly - 2-3 sets - 10-12 reps  ASSESSMENT:  CLINICAL IMPRESSION: Patient arrived for 20th visit warranting reassessment towards goals. Patient tolerated session well with no exacerbation of lower back. Good demonstration of improved LE strength, biomechanics and pain (see below at goals for 30s STS, lateral step down and pain). Subjectively the patient reports a reduction in disability as she scored 7/50 on the mODI indicating she can perform most daily activities with only minor limitations. She continues to tolerate  progression to core strengthening and functional strength. Time spent educating patient on proper knee tracking with eccentric control from stairs and squatting. We are reaching the end of her POC and overall she has demonstrated significant improvements towards PT and personal goals. PT plans to continue progressing interventions as tolerated.   OBJECTIVE IMPAIRMENTS: decreased activity tolerance, decreased endurance, difficulty walking, decreased strength, and pain.   ACTIVITY LIMITATIONS: lifting, bending, sitting, standing, and transfers  PARTICIPATION LIMITATIONS: community activity and walking pet  PERSONAL FACTORS: Age, Past/current experiences, Profession, and 1 comorbidity: benign joint hypermobility are also affecting patient's functional outcome.   REHAB POTENTIAL: Good  CLINICAL DECISION MAKING: Evolving/moderate complexity  EVALUATION COMPLEXITY: Moderate   GOALS: Goals reviewed with patient? Yes  SHORT TERM GOALS: Target date: 11/07/2023  Pt will be independent with HEP in order to improve strength and decrease back pain to improve pain-free function at home and work. Baseline: 06/26/23: Initial provided  Goal status: INITIAL   LONG TERM GOALS: Target date: 12/19/2023  Pt will report ability to walk her dog around her neighborhood more than 1 Loop (~1 mi) without report of additional pain in order to demonstrate reduction in back pain and improvements in muscular endurance   Baseline: 06/26/2023: 1 Lap; 07/20/2023: 2 Laps; 09/26/2023: Weather not permitting Goal status: Goal Met    2.  Pt will decrease worst back pain by at least 2 points on the NPRS in order to demonstrate clinically  significant reduction in back pain. Baseline: 06/26/2023: 8/10 NRPS; 07/20/2023: 3/10 NPS; 07/27/2023: 6/10 NPS; 09/26/2023: 2/10 NPS Goal status: Goal Met   3.  Pt will decrease mODI score by at least 13 points in order demonstrate clinically significant reduction in back  pain/disability.       Baseline: 14 / 50 = 28.0 %; 07/27/2023: 13 / 50 = 26.0 %; 09/26/2023: 7/50 14% Goal status: Goal Met   4.  Pt will increase 30 second sit to stand test from 13 to 18 repetitions (+5) in order to demonstrate clinically significant improvements in LE strength and endurance.  Baseline: 06/26/2023: 13; 07/27/2023: 17.5; 09/26/2023: 23 reps  Goal status: Goal Met    5.  Pt will improve lateral step down without R hip adduction/IR to demonstrate improved hip stability and motor control for pain free recreational activities.  Baseline: 06/26/2023: Moderate R hip add/IR; 07/27/2023: minor R hip add/ir with increased reps (~8); 09/26/2023: RLE with minor Hip add/ir at 8 reps  Goal status: Progressing   PLAN: PT FREQUENCY: 1-2x/week  PT DURATION: 12 weeks  PLANNED INTERVENTIONS: Therapeutic exercises, Therapeutic activity, Neuromuscular re-education, Balance training, Gait training, Patient/Family education, Self Care, Joint mobilization, Joint manipulation, Dry Needling, Electrical stimulation, Spinal manipulation, Spinal mobilization, Cryotherapy, Moist heat, Manual therapy, and Re-evaluation.  PLAN FOR NEXT SESSION:  Progress Hip and Core Stabilization exercises. Lumbar mobility (extension based). Functional strengthening.    Lonni Pall PT, DPT Physical Therapist- [facility]  09/26/2023, 5:44 PM

## 2023-09-28 ENCOUNTER — Ambulatory Visit

## 2023-09-28 DIAGNOSIS — M5459 Other low back pain: Secondary | ICD-10-CM

## 2023-09-28 DIAGNOSIS — M6281 Muscle weakness (generalized): Secondary | ICD-10-CM

## 2023-09-28 NOTE — Therapy (Signed)
 OUTPATIENT PHYSICAL THERAPY THORACOLUMBAR DISCHARGE SUMMARY     Patient Name: Alicia Carr MRN: [id number] DOB:1981-01-09, 43 y.o., female Today's Date: 09/28/2023  END OF SESSION:  PT End of Session - 09/28/23 1733     Visit Number 21    Number of Visits 25    Date for PT Re-Evaluation 10/05/23    Authorization Type [employer] 2025  VL:max combined 30 PT/OT/SLP    Authorization - Number of Visits 30    Progress Note Due on Visit 20    PT Start Time 1732    PT Stop Time 1815    PT Time Calculation (min) 43 min    Activity Tolerance Patient tolerated treatment well    Behavior During Therapy [facility] for tasks assessed/performed             Past Medical History:  Diagnosis Date   Alcoholism (HCC)    Anemia    Early hepatic fibrosis    Family history of ovarian cancer    Family history of prostate cancer    Gallstones    Genetic testing 11/29/2021   History of chicken pox    Hyperpigmentation    Longitudinal melanonychia    Nevus    right dorsal wrist   Positive ANA (antinuclear antibody)    Primary sclerosing cholangitis    Pruritus    Raynaud's syndrome    Shingles 07/18/2020   Vaginal delivery 2003   Past Surgical History:  Procedure Laterality Date   BILATERAL SALPINGECTOMY  12/03/2021   at time of RSO   CESAREAN SECTION  08/08/2006   CHOLECYSTECTOMY  2003   COLONOSCOPY  09/2020   CYSTOSCOPY  10/17/2022   [facility], Microscopic Hematuria, Repeat Micro UA in 1  year.   LIVER BIOPSY  05/2020   ROBOTIC ASSISTED SALPINGO OOPHERECTOMY Bilateral 12/03/2021   Procedure: XI ROBOTIC ASSISTED BILATERAL SALPINGECTOMY AND RIGHT OOPHORECTOMY;  Surgeon: Mace Garnette BIRCH, MD;  Location: [facility] ORS;  Service: Gynecology;  Laterality: Bilateral;   TUBAL LIGATION  2023   Tubes were actually removed due to cancer  gene mutation   Patient Active Problem List   Diagnosis Date Noted   Undifferentiated connective tissue disease (HCC) 08/15/2023   Angular cheilitis  08/15/2023   IDA (iron  deficiency anemia) 08/10/2023   Benign joint hypermobility 05/10/2023   Vitamin D  deficiency 05/10/2023   Hepatic steatosis 08/17/2022   Right ovarian cyst 12/03/2021   Monoallelic mutation of RAD51C gene 11/30/2021   Genetic testing 11/29/2021   Family history of prostate cancer 11/04/2021   Family history of ovarian cancer 11/04/2021   Dermoid cyst of right ovary 10/28/2021   Intolerant of cold 08/02/2021   Menorrhagia 08/02/2021   Folliculitis 06/10/2021   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 cause 06/18/2020    PCP: Job Lukes, PA  REFERRING PROVIDER: Jeannetta Lonni LELON, MD  REFERRING DIAG:  M54.50,G89.29 (ICD-10-CM) - Chronic bilateral low back pain without sciatica    RATIONALE FOR EVALUATION AND TREATMENT: Rehabilitation  THERAPY DIAG: Other low back pain  Muscle weakness (generalized)  ONSET DATE: Chronic > 1 yr  FOLLOW-UP APPT SCHEDULED WITH REFERRING PROVIDER: Not addressed    SUBJECTIVE:  SUBJECTIVE STATEMENT:  Intermittent diffuse Low Back Pain   PERTINENT HISTORY:   Pt reports to OPPT with chief concern of chronic ow back pain > 1 year.  She reports that sometimes she will stand up from a chair and she will experience diffuse low back pain. She reports that intense episodes of LBP will include intermittent shooting pain deep into her thigh. Patient reports focal weakness in both of her legs secondary to intense episodes. 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 Pain aggravated with transitional movements: getting up from a chair, tying a shoe, bending to pick up items. She reports prolonged sitting in an uncomfortable surface (harder chair). She denies b/b changes, saddle paresthesia, abdominal pain, chills/fever, night sweats, nausea, vomiting, unrelenting pain.   Imaging (Per Chart Review 05/02/2022):  CLINICAL DATA:  Low back and left hip pain for several weeks, no known injury, initial encounter   EXAM: LUMBAR SPINE - 3 VIEW   COMPARISON:  None Available.   FINDINGS: Five lumbar type vertebral bodies are well visualized. Vertebral body height is well maintained. Minimal osteophytic changes are noted. No anterolisthesis is seen. No soft tissue abnormality is noted.  IMPRESSION: Mild degenerative changes of the lumbar spine.   Electronically Signed   By: Oneil Devonshire M.D.   On: 05/04/2022 00:20  PAIN:    Pain Intensity: Present: 0/10, Best: 0/10, Worst: 8/10 Pain location: Low Back  Pain Quality: intermittent and shooting  Radiating: Yes  Numbness/Tingling: Yes Focal Weakness: Yes 24-hour pain behavior: Activity Dependent  History of prior back injury, pain, surgery, or therapy: Yes Dominant hand: right Imaging: Yes   PRECAUTIONS: Fall  WEIGHT BEARING RESTRICTIONS: No  FALLS: Has patient fallen in last 6 months? No  Living Environment Lives with: lives with their family Lives in: House/apartment Stairs: No Has following equipment at home: None  Prior level of function: Independent  Hobbies: Reading   Patient Goals: Patient would like to reduce pain    OBJECTIVE:  Patient Surveys  Modified Oswestry 14 / 50 = 28.0 %   Cognition Patient is oriented to person, place, and time.  Recent memory is intact.  Remote memory is intact.  Attention span and concentration are intact.  Expressive speech is intact.  Patient's fund of knowledge is within normal limits for educational  level.    Gross Musculoskeletal Assessment Tremor: None Bulk: Normal Tone: Normal  GAIT: Distance walked: 62m Assistive device utilized: None Level of assistance: Complete Independence Comments: WNL   Posture: Lumbar lordosis: WNL Iliac crest height: Equal bilaterally Lumbar lateral shift: Negative  AROM AROM (Normal range in degrees) AROM   Lumbar   Flexion (65) 100%  Extension (30) 100%  Right lateral flexion (25) 100%  Left lateral flexion (25) 100%  Right rotation (30) 100%  Left rotation (30) 100%      Hip Right Left  Flexion (125) WNL WNL  Extension (15)    Abduction (40)    Adduction     Internal Rotation (45) WNL WNL  External Rotation (45) WNL WNL      Knee    Flexion (135)    Extension (0)        Ankle    Dorsiflexion (20)    Plantarflexion (50)    Inversion (35)    Eversion (15)    (* = pain; Blank rows = not tested)  LE MMT: MMT (out of 5) Right  Left*  Hip flexion 4 4  Hip extension    Hip abduction 4- 4-  Hip adduction    Hip internal rotation 4 4-  Hip external rotation 4 4-  Knee flexion 5 5  Knee extension 5 5  Ankle dorsiflexion 5 5  Ankle plantarflexion 5 5  Ankle inversion    Ankle eversion    (* = pain; Blank rows = not tested)  Sensation Grossly intact to light touch throughout bilateral LEs as determined by testing dermatomes L2-S2. Proprioception, stereognosis, and hot/cold testing deferred on this date.  Reflexes R/L Knee Jerk (L3/4): 2+/2+  Ankle Jerk (S1/2): 2+/2+   Muscle Length Hamstrings (90-90 Test): R: Positive for tightness 40 knee ext L: Positive for tightness 55 knee ext  Palpation Location Right Left         Lumbar paraspinals 1 0  Quadratus Lumborum 0 0  Gluteus Maximus 0 0  Gluteus Medius 1 1  Deep hip external rotators 0 1  PSIS 0 0  Fortin's Area (SIJ) 0 0  Greater Trochanter 0 0  (Blank rows = not tested) Graded on 0-4 scale (0 = no pain, 1 = pain, 2 = pain with  wincing/grimacing/flinching, 3 = pain with withdrawal, 4 = unwilling to allow palpation)  Passive Accessory Intervertebral Motion Pt reports reproduction of concordant pain at L4-S1, each segment and doesn't improve with continued CPA.   Special Tests Lumbar Radiculopathy and Discogenic: SLR (SN 92, -LR 0.29): R: Negative L:  Negative Crossed SLR (SP 90): R: Negative L: Negative  Hip: FABER (SN 81): R: Positive for tightness L: Negative FADIR (SN 94): R: Negative L: Positive for concordant pain  Piriformis Syndrome: FAIR Test (SN 88, SP 83):  L: Positive for Pain in hip   FUNCTIONAL TESTS:  30s Sit to Stand Test: 13 Reps  Lateral Step Down: R: Hip IR/ADD, L: Negative  Supine Bridge: B Single Leg Bridge: R/L: Unable to maintain elevated/ neutral pelvis  TODAY'S TREATMENT: DATE: 09/26/2023  Subjective: Patient reports no lower back pain unless direct pressure applied to spine (L2-L4 level). Patient reports that she was able to complete vacation bible school activities all last week without lower back pain. Patient without sciatica related symptoms within the last two weeks. Patient agreeable to discharge this appointment due to insurance restrictions on the allowed amount of visits per year.  No questions or concerns.  Therapeutic Exercise (focused on lumbar mobility and core stabilization):  NuStep L6-2 x 3 min x LE x > 100 SPM (Seat 6) for LE warm up, endurance and strength; PT manually adjusted resistance throughout bout.   Supine 90/90 with OH reach using Med ball   1 x 10 - 2 Kg MB   1 x 10 - 3 Kg MB   Supine 90/90 with alternating LE extension   3 x 10    Dead Bug - Alternate LE/UE   1 x 15 - very little pain in lumbar spine in last two reps   2 x 12   Sidelying Clamshell agaisnt resistance   L 1 x 10 against Blue TB  L Lock Clamshell   2  x 10   Seated Pallof Press against resistance    3 x 10 - Grey TB   Time spent reviewing the rest of HEP exercises; pt with  verbal understanding of stretches and additional core exercises.     Therapeutic Activity:  Squatting Against resistance  2 x 10, Blue TB around lower legs   Lateral Stepping  against resistance  Lap 1 - 25' against Blue TB around ankles   Lap 2 - 25' against Blue TB around ankles       PATIENT EDUCATION:  Education details:  Exercise Technique    Person educated: Patient Education method: Explanation and Demonstration Education comprehension: verbalized understanding and returned demonstration   HOME EXERCISE PROGRAM:   Access Code: [email or web address] URL: [email or web address] Date: 09/26/2023 Prepared by: Lonni Kamel [place]  Exercises - Supine Piriformis Stretch with Foot on Ground  - 2 x daily - 7 x weekly - 30-60s hold - Supine Sciatic Nerve Glide  - 1 x daily - 7 x weekly - 2-3 sets - 10 reps - Seated Hamstring Stretch  - 2 x daily - 7 x weekly - 30-60s hold - Seated Piriformis Stretch  - 2 x daily - 7 x weekly - 30-60s hold - Squat with Chair Touch and Resistance Loop  - 1 x daily - 3-4 x weekly - 2-3 sets - 10-12 reps - Seated Thoracic Lumbar Extension  - 1 x daily - 4-5 x weekly - 3 sets - 15-20 reps - Supine Dead Bug with Leg Extension  - 1 x daily - 3-4 x weekly - 2-3 sets - 8-10 reps - Side Stepping with Resistance at Ankles  - 1 x daily - 3-4 x weekly - 2-3 sets - 10-12 reps - Clamshell with Resistance  - 1 x daily - 3-4 x weekly - 2-3 sets - 10-12 reps - Supine 90/90 with Leg Extensions  - 1 x daily - 3-4 x weekly - 2-3 sets - 10-12 reps - Side Plank on Knees  - 1 x daily - 3-4 x weekly - 3 sets - 10-15s hold - Kneeling Plank on Forearms with Scapular Protraction Retraction AROM  - 1 x daily - 3-4 x weekly - 3 sets - 10-15s hold - Standing Anti-Rotation Press with Anchored Resistance  - 1 x daily - 2-3 x weekly - 2-3 sets - 10-12 reps  ASSESSMENT:  CLINICAL IMPRESSION: Xanthe Couillard is a 43 y.o. female seen for lower back pain and . Pt reached end of POC  and is agreeable to discharge to home with HEP. Pt has met all remaining goals and demonstrates improvements in LE strength, core stability, and pain (See goals below). Patient endorsed 70% return to prior level of function in today's appointment. Although pain still persists patient reports her current symptoms are manageable with HEP. Time spent reviewing all interventions on comprehensive HEP and she is able to perform exercises with proper technique. Patient still presenting with moderate to severe fatigue due to lack of proper sleep cycle and stress from work. She is agreeable to be discharged with a home exercise program to maintain progress. No question at end of session. Follow-up with their primary care provider is recommended if any issues arise.   OBJECTIVE IMPAIRMENTS: decreased activity tolerance, decreased endurance, difficulty walking, decreased strength, and pain.   ACTIVITY LIMITATIONS: lifting, bending, sitting, standing, and transfers  PARTICIPATION LIMITATIONS: community activity and walking pet  PERSONAL FACTORS: Age, Past/current experiences, Profession, and 1 comorbidity: benign joint hypermobility are also affecting patient's functional outcome.   REHAB POTENTIAL: Good  CLINICAL DECISION MAKING: Evolving/moderate complexity  EVALUATION COMPLEXITY: Moderate   GOALS: Goals reviewed with patient? Yes  SHORT TERM GOALS: Target date: 11/09/2023  Pt will be independent with HEP in order to improve strength and decrease back pain to improve pain-free function at home and work. Baseline: 06/26/23: Initial provided 09/28/2023: performs  all of the supine exercises Goal status: Goal met   LONG TERM GOALS: Target date: 12/21/2023  Pt will report ability to walk her dog around her neighborhood more than 1 Loop (~1 mi) without report of additional pain in order to demonstrate reduction in back pain and improvements in muscular endurance   Baseline: 06/26/2023: 1 Lap;  07/20/2023: 2 Laps; 09/26/2023: Weather not permitting Goal status: Goal Met    2.  Pt will decrease worst back pain by at least 2 points on the NPRS in order to demonstrate clinically significant reduction in back pain. Baseline: 06/26/2023: 8/10 NRPS; 07/20/2023: 3/10 NPS; 07/27/2023: 6/10 NPS; 09/26/2023: 2/10 NPS Goal status: Goal Met   3.  Pt will decrease mODI score by at least 13 points in order demonstrate clinically significant reduction in back pain/disability.       Baseline: 14 / 50 = 28.0 %; 07/27/2023: 13 / 50 = 26.0 %; 09/26/2023: 7/50 14% Goal status: Goal Met   4.  Pt will increase 30 second sit to stand test from 13 to 18 repetitions (+5) in order to demonstrate clinically significant improvements in LE strength and endurance.  Baseline: 06/26/2023: 13; 07/27/2023: 17.5; 09/26/2023: 23 reps  Goal status: Goal Met    5.  Pt will improve lateral step down without R hip adduction/IR to demonstrate improved hip stability and motor control for pain free recreational activities.  Baseline: 06/26/2023: Moderate R hip add/IR; 07/27/2023: minor R hip add/ir with increased reps (~8); 09/26/2023: RLE with minor Hip add/ir at 8 reps  Goal status: Progressing   PLAN: PT FREQUENCY: 1-2x/week  PT DURATION: 12 weeks  PLANNED INTERVENTIONS: Discharged  PLAN FOR NEXT SESSION:  Discharge   Lonni Pall PT, DPT Physical Therapist- [facility]  [facility]  09/28/2023, 5:38 PM

## 2023-09-29 NOTE — Progress Notes (Signed)
 Office Visit Note  [name]             Date of Birth: [birth year]           MRN: [id number]             PCP: Job Lukes, PA Referring: Job Lukes, PA Visit Date: 10/13/2023   Subjective:  Follow-up (Back issues , recently finished PT still having the back pain. Wants to speak about sed rates from previous labs. Patient states she's bleeding at the easiest things.)   Discussed the use of AI scribe software for clinical note transcription with the patient, who gave verbal consent to proceed.  History of Present Illness   Alicia Carr is a 43 y.o. 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    Her liver enzyme levels, specifically GGT, were elevated at 72 during a recent visit in June. This elevation may be related to her medication, Multrys. She is scheduled for an MRCP in September as part of her routine follow-up for her liver condition, which requires imaging every three years.  She has persistent back pain, primarily located in the lower back near the tailbone, which occasionally flares up severely, affecting her ability to walk straight. The pain does not radiate, but she has experienced sciatica radiating down to her foot, though this has not occurred in the past month. Previous lumbar spine x-rays showed mild degenerative changes.  She started taking Cymbalta  in June. Since then, she has noticed increased bruising and red spots on her skin. The bruises appear in unusual places without any known trauma and are not painful or itchy. She recalls a significant bruise from a minor bump, which seemed disproportionate to the injury.  Her hands remain stiff, and she experiences pain attributed to nerve issues, possibly related to Raynaud's phenomenon, as her fingers often appear wrinkled without exposure to water. She has a history of elevated  sedimentation rates, which she is curious about, especially in relation to her upcoming hysterectomy for suspected adenomyosis.  No recent viral infections or other illnesses. No significant changes in her health status aside from the symptoms discussed.      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 Also with recent diagnosis confirming iron  deficiency anemia not yet treated with infusion. She presents with worsening circulation issues and joint pain.   She experiences persistent lower back pain radiating down her leg to the top of her foot, exacerbated by activities like walking. Physical therapy has not provided relief. Imaging from a year ago showed mild degenerative changes in her lumbar spine.   She has worsening pain and stiffness in her hands, with morning stiffness but no noticeable swelling. There is constant numbness and decreased sensitivity in her fingertips, which she attributes to a possible blood flow issue. She has not started duloxetine  due to concerns about liver enzyme elevations.   Her history of Raynaud's phenomenon began after a shingles infection three years ago. She experiences numbness in her toes, spider veins, and hyperpigmentation around her ankles. Medications like nifedipine and sildenafil  have been tried with limited success.   She is anemic, with a ferritin level of 10, and is scheduled for an iron  infusion. She has experienced angular cheilitis, which she associates with her iron  deficiency. She has been referred to an  endocrinologist due to hormonal concerns, but her hormones have been normal except for iron  deficiency anemia.   She had a cellulitis infection on her shoulder a few months ago, treated with antibiotics, which she believes started as an insect bite. She has had recurrent infections, including bacterial  eye infections, and has been taking more antibiotics recently.   She reports a new symptom of visible green veins and blotchiness on her skin, which she associates with hormonal changes, though her hormone levels are normal. She has a history of a positive ANA titer and elevated sedimentation rate, with a past positive anti-smooth muscle antibody test. 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        She experiences worsening joint pain, particularly in her hands, which has progressed to tingling, stiffness, and numbness, making it difficult to hold objects like her cell phone. Pain is also present in her hip and other joints. She has been referred to physical therapy previously. No sclerodactyly or hardening of the skin is noted. Intermittent swelling in her fingers and joint pain primarily in her pinkies are reported.   She associates some of her symptoms with circulation issues, such as blood pooling and micro hemorrhages in her fingers. She has been evaluated by a cardiologist who suggested a rheumatological cause for these symptoms.   She describes a history of hyperpigmentation, with spider veins and brown discoloration around her ankles. Dermatologists have suggested a hormonal cause, leading to a referral to endocrinology, though she has not yet attended that appointment. She also reports non-painful canker sores in her mouth.   She has a history of recurrent shingles and is on ongoing Valtrex  treatment. She also experiences frequent eye infections and uses liquid tears for dry eyes. She reports prolonged recovery from illnesses, such as a three-week episode of diarrhea in January.   She has a history of liver issues, but recent liver tests have been normal. She is concerned about starting duloxetine  due to potential liver interactions, although she has not yet begun the medication.      Previous  HPI 02/08/2022 Alicia Carr is a 43 y.o. 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  She has been off any specific treatment since last visit besides supplementing vitamin D  and eyedrops.  Has not seen any resolution of hyperpigmented areas still has new skin rashes popping up in several areas and dry skin.  With cooler weather she is noticing blue discoloration in her fingers more frequently she is not sure if this is the Raynaud's specifically or just general cold intolerance.  She had genetic testing in September as recommended by Dr. Avram this was not positive for GI association did have mutation with increased risk of breast and ovarian cancer.  So she had right ovary as well as bilateral fallopian tubes removed when going for the ovarian cyst surgery.  Does think she might be having more arthralgias since quitting the hydroxychloroquine  but could also be related to cold weather.  Not seeing any obvious swelling or effusions.   10/26/21 Alicia Carr is a 43 y.o. female here for follow up for undifferentiated connective tissue disease symptoms.  Unclear regarding relation of symptoms to underlying diagnosis of early primary sclerosing cholangitis.  She stopped taking the hydroxychloroquine  for concern about a contributing to skin hyperpigmentation has not seen any change regarding this.  She did  notice an increase in joint pains particularly around her knuckles and in bilateral knees. This is not debilitating or requiring her to take additional over-the-counter medication to manage.  Raynaud's symptoms have been doing better and she thinks is associated with the recent warmer weather.  Repeat lab follow-up with normal liver function test.  She followed up with Dr. Avram regarding concerns and has been referred to see a geneticist upcoming appointment on September 6.   08/02/2021 Alicia Alicia  Carr is a 43 y.o. female here for follow up for raynaud's symptoms, currently taking plaquenil  200 mg by mouth once daily. Since our last visit she feels the joint pain and stiffness improved from before. She also has less raynaud's symptoms than before, although this is not unusual during warmer time of the year. She continues having the small areas of twitching from before and saw Dr. Margaret for this with workup including broad screening antibody testing and imaging of cervical and thoracic spine without major abnormalities. She reports progression of skin hyperpigmentation changes affecting her in multiple areas in patches and also some isolated small hyperpigmented spots on her face and palms and longitudinal changes in her fingernails. She had a nail biopsy consistent with melanotic macule. She saw Rocky Pereyra for this problem who had some concern about possible autoimmune vs genetic or proliferative causes. Symptoms started before exposure to hydroxychloroquine  but also concern for exacerbating symptoms. She has a lot of healthcare related anxiety surrounding this problem, worse after a coworker passed unexpected from metastatic pancreatic cancer recently. She is drinking alcohol more frequently now and has started to gain weight and feels this is from depressed mood.   05/03/2021 Deshannon DREYA BUHRMAN is a 43 y.o. female here for follow up for raynaud's symptoms after starting sildenafil  20 mg. She continued noticing finger discoloration and rashes. She is also concerned with noticing some hyperpigmentation on the palms of her hands and also darkening of her gums. Still having some twitching and aches and overall concerned she is not feeling well. She had a mild amount of leg swelling intermittently. She is concerned about if her symptoms could be an adrenal or hormonal issue with ongoing skin changes and generalized symptoms.   04/07/21 Michie DORTHEY DEPACE is a 43 y.o. female here for follow up with  numerous different symptoms ongoing some in exacerbation.  So far addition of the amlodipine  has not significantly helped her finger or toe discoloration is actually having more blue and violaceous changes than before in the fingers.  She feels extremely cold much of the time somewhat regardless of the ambient temperature.  Chronic persistent fatigue remains her most significant issue.  She also continues having muscle twitching or inability to fully close or extend for periods of time the seem to come and go without trigger.  She has some hand swelling no but no increase in leg swelling.  She was feeling some palpitations and wore a heart monitor apparently this showed some episodes of moderate tachycardia but without concerning arrhythmias seen.   10/06/20 Shalese JADYN BRASHER is a 43 y.o. female here with joint pains and numbness affecting bilateral fingers and toes that is worse especially in the past few months.  She developed shingles outbreak on the face and left eye in May this is treated and symptoms have improved besides some residual hyperpigmentation.  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  She reports  morning stiffness difficulty gripping her hands tightly or performing tasks first thing in the morning this improves some time and is normal by the afternoon.  She notices occasional swelling or puffiness especially in the feet but not all the time.  She denies discoloration such as pallor, erythema, or cyanosis.  She tried taking oral diclofenac  for joint pains but developed an episode of painless hematochezia so stopped this medication. She denies any lymphadenopathy, photosensitive rashes, or history of blood clots.   Review of Systems  Constitutional:  Positive for fatigue.  HENT:  Positive for mouth sores. Negative for mouth dryness.   Eyes:  Positive for dryness.  Respiratory:   Negative for shortness of breath.   Cardiovascular:  Positive for palpitations. Negative for chest pain.  Gastrointestinal:  Positive for constipation and diarrhea. Negative for blood in stool.  Endocrine: Negative for increased urination.  Genitourinary:  Negative for involuntary urination.  Musculoskeletal:  Positive for joint pain, gait problem, joint pain, myalgias, morning stiffness and myalgias. Negative for joint swelling, muscle weakness and muscle tenderness.  Skin:  Positive for color change and rash. Negative for hair loss and sensitivity to sunlight.  Allergic/Immunologic: Positive for susceptible to infections.  Neurological:  Positive for dizziness. Negative for headaches.  Hematological:  Negative for swollen glands.  Psychiatric/Behavioral:  Positive for sleep disturbance. Negative for depressed mood. The patient is nervous/anxious.     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 cause 06/18/2020    Past Medical History:  Diagnosis Date   Alcoholism (HCC)    Anemia    Early hepatic fibrosis     Family history of ovarian cancer    Family history of prostate cancer    Gallstones    Genetic testing 11/29/2021   History of chicken pox    Hyperpigmentation    Longitudinal melanonychia    Nevus    right dorsal wrist   Positive ANA (antinuclear antibody)    Primary sclerosing cholangitis    Pruritus    Raynaud's syndrome    Shingles 07/18/2020   Vaginal delivery 2003    Family History  Problem Relation Age of Onset   Mental illness Mother        paranoid schizophrenia   Hyperlipidemia Mother    Alcohol abuse Father    Colon polyps Father    Hypertension Father    Prostate cancer Father 71   Heart murmur Brother    Hypertension Brother    Cancer Maternal Grandmother        uncertain type   Healthy Daughter    Asthma Daughter    Learning disabilities Daughter    ADD / ADHD Daughter  Anxiety disorder Daughter    Healthy Son    Diabetes Paternal Aunt    Colon cancer Paternal Uncle    Ovarian cancer Paternal Great-grandmother    Ovarian cancer Other        PGF sister   Breast cancer Neg Hx    Endometrial cancer Neg Hx    Pancreatic cancer Neg Hx    Past Surgical History:  Procedure Laterality Date   BILATERAL SALPINGECTOMY  12/03/2021   at time of RSO   CESAREAN SECTION  08/08/2006   CHOLECYSTECTOMY  2003   COLONOSCOPY  09/2020   CYSTOSCOPY  10/17/2022   [facility], Microscopic Hematuria, Repeat Micro UA in 1  year.   LIVER BIOPSY  05/2020   ROBOTIC ASSISTED SALPINGO OOPHERECTOMY Bilateral 12/03/2021   Procedure: XI ROBOTIC ASSISTED BILATERAL SALPINGECTOMY AND RIGHT OOPHORECTOMY;  Surgeon: Leonce Garnette BIRCH, MD;  Location: [facility];  Service: Gynecology;  Laterality: Bilateral;   TUBAL LIGATION  2023   Tubes were actually removed due to cancer  gene mutation   Social History   Social History Narrative   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, retail for [employer]   1 son born 2003 1 daughter born 2008   Alcohol use regular for 10+  years stopped 01/18/2020 was 2 to 3 glasses of wine daily   No drug use   Coffee and tea 2 or 3 a day for caffeine   Immunization History  Administered Date(s) Administered   Hep A / Hep B 09/08/2020, 10/12/2020   Hepatitis A, Adult 03/12/2021   Hepatitis B, PED/ADOLESCENT 03/12/2021, 03/12/2021   Influenza, Seasonal, Injecte, Preservative Fre 12/10/2022   Influenza,inj,Quad PF,6+ Mos 12/26/2018, 11/29/2019, 12/03/2020, 12/14/2021   Influenza,inj,quad, With Preservative 11/29/2019   PFIZER(Purple Top)SARS-COV-2 Vaccination 05/02/2019, 05/23/2019, 01/03/2020, 10/06/2020   Pfizer(Comirnaty)Fall Seasonal Vaccine 12 years and older 12/10/2022   Tdap 05/10/2006, 11/29/2019     Objective: Vital Signs: BP 125/76 (BP Location: Left Arm, Patient Position: Sitting, Cuff Size: Normal)   Pulse 96   Ht 5' 1 (1.549 m)   Wt 165 lb (74.8 kg)   LMP 09/28/2023   BMI 31.18 kg/m    Physical Exam Eyes:     Conjunctiva/sclera: Conjunctivae normal.  Cardiovascular:     Rate and Rhythm: Normal rate and regular rhythm.  Pulmonary:     Effort: Pulmonary effort is normal.     Breath sounds: Normal breath sounds.  Lymphadenopathy:     Cervical: No cervical adenopathy.  Skin:    General: Skin is warm and dry.     Findings: No rash.     Comments: Melanonychia, No digital pitting  Neurological:     Mental Status: She is alert.  Psychiatric:        Mood and Affect: Mood normal.      Musculoskeletal Exam:  Shoulders full ROM no tenderness or swelling Elbows hyperextensible, no swelling or tenderness Wrists full ROM no tenderness or swelling Midline and paraspinal low back tenderness to pressure Hip normal internal and external rotation without pain, no tenderness to lateral hip palpation Knees full ROM no tenderness or swelling Ankles full ROM no tenderness or swelling  Investigation: No additional findings.  Imaging: No results found.  Recent Labs: Lab Results  Component Value Date    WBC 5.8 10/13/2023   HGB 11.8 10/13/2023   PLT 287 10/13/2023   NA 136 08/15/2023   K 5.0 08/15/2023   CL 101 08/15/2023   CO2 26 08/15/2023  GLUCOSE 88 08/15/2023   BUN 16 08/15/2023   CREATININE 0.80 08/15/2023   BILITOT 0.2 08/15/2023   ALKPHOS 86 04/06/2023   AST 32 (H) 08/15/2023   ALT 39 (H) 08/15/2023   PROT 7.1 08/15/2023   ALBUMIN 4.3 04/06/2023   CALCIUM 9.3 08/15/2023   GFRAA 95 08/24/2016   QFTBGOLDPLUS NEGATIVE 07/23/2020    Speciality Comments: PLQ Eye Exam normal 02/08/2022 WNL [employer], OD f/u 6 months  Procedures:  No procedures performed Allergies: Patient has no known allergies.   Assessment / Plan:     Visit Diagnoses: Undifferentiated connective tissue disease (HCC) - Plan: Sedimentation rate Continues to experience a number of individually low-grade but cumulatively bothersome symptoms and joint skin constitutional complaints.  Not really any specific new clinical criteria suggestive for underlying systemic connective tissue disease.  Lack of response with medication so far and I do not see enough evidence of immune overactivity to recommend trial of more aggressive regimen such as biologic DMARD at this time. -Rechecking sed rate for disease activity monitoring  Chronic low back pain with intermittent right-sided sciatica Chronic low back pain with intermittent sciatica, primarily affecting lower back and tailbone. Previous x-ray showed mild degenerative changes.  Hand pain and stiffness with capillary changes Persistent hand pain and stiffness with capillary changes. Cymbalta  started in June with some improvement. Concerns about Cymbalta  side effects, including bruising, but current low dose makes this unlikely. Normal liver function tests. - Do not increase Cymbalta  dose.  Easy bruising and skin changes, possible medication effect Easy bruising possibly related to Cymbalta . Bruising more pronounced, some spontaneous. Anemia unlikely. No  significant clotting or platelet abnormalities. - Consider stopping Cymbalta  if bruising persists or worsens. - Recheck complete blood count and checking PT/INR.  Chronic liver disease with elevated liver enzymes Chronic liver disease with mildly elevated liver enzymes. Liver specialist not concerned. MRCP scheduled for September. Normal liver function tests and platelet counts.  Menorrhagia with irregular cycle - Plan: INR/PT, CBC with Differential/Platelet Going for hysterectomy next month.   Orders: Orders Placed This Encounter  Procedures   Sedimentation rate   INR/PT   CBC with Differential/Platelet   No orders of the defined types were placed in this encounter.    Follow-Up Instructions: Return in about 3 months (around 01/13/2024) for UCTD on CYM f/u 3mos.   Lonni Alicia Ester, MD  Note - This record has been created using [employer].  Chart creation errors have been sought, but may not always  have been located. Such creation errors do not reflect on  the standard of medical care.

## 2023-10-03 ENCOUNTER — Ambulatory Visit

## 2023-10-05 ENCOUNTER — Ambulatory Visit

## 2023-10-13 ENCOUNTER — Encounter: Payer: Self-pay | Admitting: Internal Medicine

## 2023-10-13 ENCOUNTER — Ambulatory Visit: Attending: Internal Medicine | Admitting: Internal Medicine

## 2023-10-13 VITALS — BP 125/76 | HR 96 | Ht 61.0 in | Wt 165.0 lb

## 2023-10-13 DIAGNOSIS — K8301 Primary sclerosing cholangitis: Secondary | ICD-10-CM

## 2023-10-13 DIAGNOSIS — N921 Excessive and frequent menstruation with irregular cycle: Secondary | ICD-10-CM

## 2023-10-13 DIAGNOSIS — M357 Hypermobility syndrome: Secondary | ICD-10-CM

## 2023-10-13 DIAGNOSIS — R768 Other specified abnormal immunological findings in serum: Secondary | ICD-10-CM

## 2023-10-13 DIAGNOSIS — K760 Fatty (change of) liver, not elsewhere classified: Secondary | ICD-10-CM

## 2023-10-13 DIAGNOSIS — M359 Systemic involvement of connective tissue, unspecified: Secondary | ICD-10-CM | POA: Diagnosis not present

## 2023-10-13 DIAGNOSIS — I7300 Raynaud's syndrome without gangrene: Secondary | ICD-10-CM

## 2023-10-13 DIAGNOSIS — T148XXA Other injury of unspecified body region, initial encounter: Secondary | ICD-10-CM

## 2023-10-14 LAB — CBC WITH DIFFERENTIAL/PLATELET
Absolute Lymphocytes: 1612 {cells}/uL (ref 850–3900)
Absolute Monocytes: 365 {cells}/uL (ref 200–950)
Basophils Absolute: 29 {cells}/uL (ref 0–200)
Basophils Relative: 0.5 %
Eosinophils Absolute: 99 {cells}/uL (ref 15–500)
Eosinophils Relative: 1.7 %
HCT: 37.6 % (ref 35.0–45.0)
Hemoglobin: 11.8 g/dL (ref 11.7–15.5)
MCH: 26.3 pg — ABNORMAL LOW (ref 27.0–33.0)
MCHC: 31.4 g/dL — ABNORMAL LOW (ref 32.0–36.0)
MCV: 83.9 fL (ref 80.0–100.0)
MPV: 10.9 fL (ref 7.5–12.5)
Monocytes Relative: 6.3 %
Neutro Abs: 3695 {cells}/uL (ref 1500–7800)
Neutrophils Relative %: 63.7 %
Platelets: 287 Thousand/uL (ref 140–400)
RBC: 4.48 Million/uL (ref 3.80–5.10)
RDW: 17.6 % — ABNORMAL HIGH (ref 11.0–15.0)
Total Lymphocyte: 27.8 %
WBC: 5.8 Thousand/uL (ref 3.8–10.8)

## 2023-10-14 LAB — PROTIME-INR
INR: 0.9
Prothrombin Time: 10.3 s (ref 9.0–11.5)

## 2023-10-14 LAB — SEDIMENTATION RATE: Sed Rate: 9 mm/h (ref 0–20)

## 2023-10-22 ENCOUNTER — Encounter: Payer: Self-pay | Admitting: Physician Assistant

## 2023-10-23 MED ORDER — ONDANSETRON 4 MG PO TBDP: 4.0000 mg | ORAL_TABLET | Freq: Three times a day (TID) | ORAL | 1 refills | Status: DC | PRN

## 2023-10-31 ENCOUNTER — Other Ambulatory Visit

## 2023-11-10 ENCOUNTER — Ambulatory Visit
Admission: RE | Admit: 2023-11-10 | Discharge: 2023-11-10 | Disposition: A | Discharge status: Home / Self Care | Source: Ambulatory Visit | Attending: Nurse Practitioner | Admitting: Nurse Practitioner

## 2023-11-10 ENCOUNTER — Encounter: Payer: Self-pay | Admitting: Hematology and Oncology

## 2023-11-10 DIAGNOSIS — K7401 Hepatic fibrosis, early fibrosis: Secondary | ICD-10-CM

## 2023-11-10 DIAGNOSIS — R748 Abnormal levels of other serum enzymes: Secondary | ICD-10-CM

## 2023-11-10 DIAGNOSIS — K8301 Primary sclerosing cholangitis: Secondary | ICD-10-CM

## 2023-11-10 DIAGNOSIS — K760 Fatty (change of) liver, not elsewhere classified: Secondary | ICD-10-CM

## 2023-11-10 MED ORDER — GADOPICLENOL 0.5 MMOL/ML IV SOLN
7.0000 mL | Freq: Once | INTRAVENOUS | Status: AC | PRN
  Administered 2023-11-10: 7 mL via INTRAVENOUS

## 2023-11-10 NOTE — H&P (Signed)
 Preoperative History and Physical   Chief Complaint: Alicia Carr is a 43 y.o. [phone] here for surgical management of Menorrhagia with irregular cycle, anemia due to chronic blood loss, fibroid uterus, RAD51C gene mutation positive.   No significant preoperative concerns.   History of Present Illness: She experiences heavy menstrual bleeding requiring changing a pad and tampon every hour or two during her seven-day menstrual cycle. Her periods are regular but have been heavy since a few months after her previous surgery, which initially resulted in lighter periods. She has been diagnosed with anemia, with a ferritin level of 4 in May, which has since increased to 10, and a hemoglobin level around 10. Symptoms of anemia include dizzy spells and fatigue.   She reports worsening sciatica-like pain, described as a 'nervy pain' radiating down her right leg from deep in her thigh to the top of her foot. This pain intensifies before her menstrual cycle and subsides about five days after it ends. Currently, she is not experiencing the pain.   Her past medical history includes a previous surgery that initially improved her menstrual symptoms, but the heavy bleeding returned after a few months. She has a history of liver disease, considered a precancerous condition, and a RAD51C gene mutation, which contributes to her health anxiety.   She has undergone imaging studies, including ultrasounds, which suggested a possible fibroid or adenomyoma and a cyst on her ovary. The measurements of her uterus have shown slight changes over time, but nothing significantly abnormal.   Ultrasound demonstrates the following findings Adnexa: left with 2.2 x 2.0 x 21 cm somewhat complex lesion within ovary with peripheral vascularity. Indeterminate significance. Right ovary absent Uterus: anteverted with endometrial stripe  14.3 mm Additional: FIGO 5 subserosal fibroid versus adenomyoma measuring 2.2 x 1.6 x 2.1 cm.  Slight  asymmetry and hypoechoic striations of the myometrium, possibly suggestive of adenomyosis   She has talked with her hepatologist and that she could take estrogen replacement, given her liver issues.    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  I reviewed with the patient that this is consistent with very early scarring in the liver. It is possible that she may have progressive scarring over time given PSC. I would recommend restaging fibrosis in 2 years with FibroScan.   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   Raynaud's syndrome without gangrene 01/2021   Shingles 07/18/2020   Vitamin D  deficiency        Past Surgical History       Past Surgical History:  Procedure Laterality Date   CHOLECYSTECTOMY   2003   CESAREAN SECTION   08/08/2006   COLONOSCOPY   09/2020   XI ROBOTIC ASSISTED BILATERAL SALPINGECTOMY AND RIGHT OOPHORECTOMY   12/03/2021    Dr,  Kendell Gammon   OOPHORECTOMY   12/03/2021    Right Ovary Removed   PELVIC LAPAROSCOPY   12/03/2021    Fallopian tubes removed                       OB History  Gravida Para Term Preterm AB Living   2 2 2     2    SAB IAB Ectopic Molar Multiple Live Births             2      # Outcome Date GA Lbr Len/2nd Weight Sex Type Anes PTL Lv  2 Term 08/08/06       F CS-LTranv     LIV  1 Term 04/02/01       M  Vag-Spont     LIV  Patient denies any other pertinent gynecologic issues.    Medications Ordered Prior to Encounter        Current Outpatient Medications on File Prior to Visit  Medication Sig Dispense Refill   cholecalciferol (VITAMIN D3) 5,000 unit capsule Take 1 capsule (5,000 Units total) by mouth once daily       clindamycin (CLINDAGEL) 1 % topical gel Apply topically as needed       ergocalciferol , vitamin D2, 1,250 mcg (50,000 unit) capsule Take 50,000 Units by mouth every 7 (seven) days       ferrous sulfate  325 (65 FE) MG EC tablet Take 1 tablet (325 mg total) by mouth daily with breakfast       tretinoin (RETIN-A) 0.05 % cream 1 application in the evening to face       valACYclovir  (VALTREX ) 1000 MG tablet Take 1,000 mg by mouth 2 (two) times daily       amLODIPine  (NORVASC ) 5 MG tablet  (Patient not taking: Reported on 11/09/2023)       loteprednol (LOTEMAX) 0.5 % ophthalmic suspension  (Patient not taking: Reported on 06/20/2023)       medroxyPROGESTERone (PROVERA) 10 MG tablet Take 2 tablets (20 mg total) by mouth 3 (three) times a day for 3 days 18 tablet 0   PLAQUENIL  200 mg tablet as directed Orally (Patient not taking: Reported on 06/20/2023)       prednisoLONE acetate (PRED FORTE) 1 % ophthalmic suspension  (Patient not taking: Reported on 06/20/2023)        No current facility-administered medications on file prior to visit.      Allergies  No Known Allergies     Social History:   reports that she has never smoked. She has never been exposed to tobacco smoke. She has never used smokeless tobacco. She reports current alcohol use of about 1.0 - 2.0 standard drink of alcohol per week. She reports that she does not use drugs.   Family History        Family History  Problem Relation Name Age of Onset   Hyperlipidemia (Elevated cholesterol) Mother teresa     Paranoid behavior Mother teresa     Schizophrenia Mother teresa     Mental illness Mother teresa          Paranoid  Schizophrenia   High blood pressure (Hypertension) Father daniel     Alcohol abuse Father daniel     Colon polyps Father daniel     Heart murmur Brother       Cancer Maternal Grandmother Zelda          Unknown   Learning disabilities Daughter rowan  Asthma Daughter rowan     Autism Daughter rowan     ADD / ADHD Daughter rowan     No Known Problems Daughter       No Known Problems Son       Diabetes Paternal Aunt Name Unknown     Ovarian cancer Other            Review of Systems: Noncontributory   PHYSICAL EXAM: Blood pressure 134/83, pulse 103, height 152.4 cm (5'), weight 77.3 kg (170 lb 6.4 oz), last menstrual period 10/24/2023. CONSTITUTIONAL: Well-developed, well-nourished female in no acute distress.  HENT:  Normocephalic, atraumatic, External right and left ear normal. Oropharynx is clear and moist EYES: Conjunctivae and EOM are normal. Pupils are equal, round, and reactive to light. No scleral icterus.  NECK: Normal range of motion, supple, no masses SKIN: Skin is warm and dry. No rash noted. Not diaphoretic. No erythema. No pallor. NEUROLGIC: Alert and oriented to person, place, and time. Normal reflexes, muscle tone coordination. No cranial nerve deficit noted. PSYCHIATRIC: Normal mood and affect. Normal behavior. Normal judgment and thought content. CARDIOVASCULAR: Normal heart rate noted, regular rhythm RESPIRATORY: Effort and breath sounds normal, no problems with respiration noted ABDOMEN: Soft, nontender, nondistended. PELVIC: Deferred MUSCULOSKELETAL: Normal range of motion. No edema and no tenderness. 2+ distal pulses.   Labs: Recent Results  No results found for this or any previous visit (from the past 2 weeks).     Imaging Studies: No results found.   Assessment: Problem List Items Addressed This Visit   None Visit Diagnoses         Menorrhagia with irregular cycle    -  Primary      Anemia due to chronic blood loss          Intramural leiomyoma  of uterus          RAD51C gene mutation positive               Plan: Patient will undergo surgical management with the above note surgery.   The risks of surgery were discussed in detail with the patient including but not limited to: bleeding which may require transfusion or reoperation; infection which may require antibiotics; injury to surrounding organs which may involve bowel, bladder, ureters ; need for additional procedures including laparoscopy or laparotomy; thromboembolic phenomenon, surgical site problems and other postoperative/anesthesia complications. Likelihood of success in alleviating the patient's condition was discussed. Routine postoperative instructions will be reviewed with the patient and her family in detail after surgery.  The patient concurred with the proposed plan, giving informed written consent for the surgery.   Preoperative prophylactic antibiotics, as indicated, and SCDs ordered on call to the OR.     Pathology for ovary per high-risk protocol   Return in 20 days (on 11/29/2023) for Post-op incision check, Keep previously scheduled appts.    Attestation Statement:    I personally performed the service. (TP)   Deyanira Fesler TORIBIO MACE, MD  [facility] OB/GYN [facility] 11/09/2023 5:01 PM

## 2023-11-13 ENCOUNTER — Telehealth: Payer: Self-pay | Admitting: Diagnostic Neuroimaging

## 2023-11-13 NOTE — Telephone Encounter (Signed)
 ..  Pt understands that although there may be some limitations with this type of visit, we will take all precautions to reduce any security or privacy concerns.  Pt understands that this will be treated like an in office visit and we will file with pt's insurance, and there may be a patient responsible charge related to this service. ? ?

## 2023-11-13 NOTE — Telephone Encounter (Signed)
 LVM and sent mychart msg informing pt of need to reschedule 11/14/23 appt - MD out

## 2023-11-13 NOTE — Telephone Encounter (Signed)
 Noted

## 2023-11-14 ENCOUNTER — Ambulatory Visit: Payer: Managed Care, Other (non HMO) | Admitting: Diagnostic Neuroimaging

## 2023-11-14 ENCOUNTER — Inpatient Hospital Stay: Admission: RE | Admit: 2023-11-14 | Source: Ambulatory Visit

## 2023-11-16 ENCOUNTER — Encounter: Payer: Self-pay | Admitting: Hematology and Oncology

## 2023-11-17 ENCOUNTER — Encounter: Payer: Self-pay | Admitting: Obstetrics and Gynecology

## 2023-11-17 ENCOUNTER — Encounter
Admission: RE | Admit: 2023-11-17 | Discharge: 2023-11-17 | Disposition: A | Discharge status: Home / Self Care | Source: Ambulatory Visit | Attending: Obstetrics and Gynecology | Admitting: Obstetrics and Gynecology

## 2023-11-17 ENCOUNTER — Other Ambulatory Visit: Payer: Self-pay

## 2023-11-17 DIAGNOSIS — Z01812 Encounter for preprocedural laboratory examination: Secondary | ICD-10-CM

## 2023-11-17 DIAGNOSIS — K7401 Hepatic fibrosis, early fibrosis: Secondary | ICD-10-CM

## 2023-11-17 DIAGNOSIS — N921 Excessive and frequent menstruation with irregular cycle: Secondary | ICD-10-CM

## 2023-11-17 DIAGNOSIS — Z01818 Encounter for other preprocedural examination: Secondary | ICD-10-CM

## 2023-11-17 HISTORY — DX: Excessive and frequent menstruation with irregular cycle: N92.1

## 2023-11-17 HISTORY — DX: Genetic susceptibility to malignant neoplasm of breast: Z15.01

## 2023-11-17 HISTORY — DX: Genetic susceptibility to other malignant neoplasm: Z15.09

## 2023-11-17 HISTORY — DX: Leiomyoma of uterus, unspecified: D25.9

## 2023-11-17 HISTORY — DX: Gastro-esophageal reflux disease without esophagitis: K21.9

## 2023-11-17 HISTORY — DX: Anxiety disorder, unspecified: F41.9

## 2023-11-17 NOTE — Patient Instructions (Addendum)
 Your procedure is scheduled on:11-22-23 Wednesday Report to the Registration Desk on the 1st floor of the [facility].Then proceed to the 2nd floor Surgery Desk To find out your arrival time, please call [phone] between 1PM - 3PM on:11-21-23 Tuesday If your arrival time is 6:00 am, do not arrive before that time as the [facility] entrance doors do not open until 6:00 am.  REMEMBER: Instructions that are not followed completely may result in serious medical risk, up to and including death; or upon the discretion of your surgeon and anesthesiologist your surgery may need to be rescheduled.  Do not eat food OR drink liquids after midnight the night before surgery.  No gum chewing or hard candies.  One week prior to surgery:Stop NOW (11-17-23) Stop Anti-inflammatories (NSAIDS) such as Advil , Aleve, Ibuprofen , Motrin , Naproxen, Naprosyn and Aspirin based products such as Excedrin, Goody's Powder, BC Powder. Stop ANY OVER THE COUNTER supplements until after surgery (Vitamin D , Ferrous Sulfate , Emergen-C-Vitamin C)  You may however, continue to take Tylenol  if needed for pain up until the day of surgery.  Continue taking all of your other prescription medications up until the day of surgery.  ON THE DAY OF SURGERY ONLY TAKE THESE MEDICATIONS WITH SIPS OF WATER: -DULoxetine  (CYMBALTA )   No Alcohol for 24 hours before or after surgery.  No Smoking including e-cigarettes for 24 hours before surgery.  No chewable tobacco products for at least 6 hours before surgery.  No nicotine  patches on the day of surgery.  Do not use any recreational drugs for at least a week (preferably 2 weeks) before your surgery.  Please be advised that the combination of cocaine and anesthesia may have negative outcomes, up to and including death. If you test positive for cocaine, your surgery will be cancelled.  On the morning of surgery brush your teeth with toothpaste and water, you may rinse your mouth with  mouthwash if you wish. Do not swallow any toothpaste or mouthwash.  Use CHG Soap as directed on instruction sheet.  Do not wear jewelry, make-up, hairpins, clips or nail polish.  For welded (permanent) jewelry: bracelets, anklets, waist bands, etc.  Please have this removed prior to surgery.  If it is not removed, there is a chance that hospital personnel will need to cut it off on the day of surgery.  Do not wear lotions, powders, or perfumes.   Do not shave body hair from the neck down 48 hours before surgery.  Contact lenses, hearing aids and dentures may not be worn into surgery.  Do not bring valuables to the hospital. [facility] is not responsible for any missing/lost belongings or valuables.   Notify your doctor if there is any change in your medical condition (cold, fever, infection).  Wear comfortable clothing (specific to your surgery type) to the hospital.  After surgery, you can help prevent lung complications by doing breathing exercises.  Take deep breaths and cough every 1-2 hours. Your doctor may order a device called an Incentive Spirometer to help you take deep breaths. When coughing or sneezing, hold a pillow firmly against your incision with both hands. This is called "splinting." Doing this helps protect your incision. It also decreases belly discomfort.  If you are being admitted to the hospital overnight, leave your suitcase in the car. After surgery it may be brought to your room.  In case of increased patient census, it may be necessary for you, the patient, to continue your postoperative care in the Same  Day Surgery department.  If you are being discharged the day of surgery, you will not be allowed to drive home. You will need a responsible individual to drive you home and stay with you for 24 hours after surgery.   If you are taking public transportation, you will need to have a responsible individual with you.  Please call the Pre-admissions Testing  Dept. at [phone] if you have any questions about these instructions.  Surgery Visitation Policy:  Patients having surgery or a procedure may have two visitors.  Children under the age of 67 must have an adult with them who is not the patient.                                                                                                             Preparing for Surgery with CHLORHEXIDINE  GLUCONATE (CHG) Soap  Chlorhexidine  Gluconate (CHG) Soap  o An antiseptic cleaner that kills germs and bonds with the skin to continue killing germs even after washing  o Used for showering the night before surgery and morning of surgery  Before surgery, you can play an important role by reducing the number of germs on your skin.  CHG (Chlorhexidine  gluconate) soap is an antiseptic cleanser which kills germs and bonds with the skin to continue killing germs even after washing.  Please do not use if you have an allergy to CHG or antibacterial soaps. If your skin becomes reddened/irritated stop using the CHG.  1. Shower the NIGHT BEFORE SURGERY and the MORNING OF SURGERY with CHG soap.  2. If you choose to wash your hair, wash your hair first as usual with your normal shampoo.  3. After shampooing, rinse your hair and body thoroughly to remove the shampoo.  4. Use CHG as you would any other liquid soap. You can apply CHG directly to the skin and wash gently with a scrungie or a clean washcloth.  5. Apply the CHG soap to your body only from the neck down. Do not use on open wounds or open sores. Avoid contact with your eyes, ears, mouth, and genitals (private parts). Wash face and genitals (private parts) with your normal soap.  6. Wash thoroughly, paying special attention to the area where your surgery will be performed.  7. Thoroughly rinse your body with warm water.  8. Do not shower/wash with your normal soap after using and rinsing off the CHG soap.  9. Pat yourself dry with a clean  towel.  10. Wear clean pajamas to bed the night before surgery.  12. Place clean sheets on your bed the night of your first shower and do not sleep with pets.  13. Shower again with the CHG soap on the day of surgery prior to arriving at the hospital.  14. Do not apply any deodorants/lotions/powders.  15. Please wear clean clothes to the hospital.   [employer] to address health-related social needs:  [email or web address]

## 2023-11-20 ENCOUNTER — Ambulatory Visit: Payer: Managed Care, Other (non HMO) | Admitting: Hematology and Oncology

## 2023-11-21 ENCOUNTER — Encounter
Admission: RE | Admit: 2023-11-21 | Discharge: 2023-11-21 | Disposition: A | Discharge status: Home / Self Care | Source: Ambulatory Visit | Attending: Obstetrics and Gynecology | Admitting: Obstetrics and Gynecology

## 2023-11-21 DIAGNOSIS — K7401 Hepatic fibrosis, early fibrosis: Secondary | ICD-10-CM | POA: Insufficient documentation

## 2023-11-21 DIAGNOSIS — Z01812 Encounter for preprocedural laboratory examination: Secondary | ICD-10-CM | POA: Insufficient documentation

## 2023-11-21 DIAGNOSIS — N921 Excessive and frequent menstruation with irregular cycle: Secondary | ICD-10-CM | POA: Diagnosis not present

## 2023-11-21 LAB — CBC
HCT: 36 % (ref 36.0–46.0)
Hemoglobin: 11.3 g/dL — ABNORMAL LOW (ref 12.0–15.0)
MCH: 26.5 pg (ref 26.0–34.0)
MCHC: 31.4 g/dL (ref 30.0–36.0)
MCV: 84.5 fL (ref 80.0–100.0)
Platelets: 262 K/uL (ref 150–400)
RBC: 4.26 MIL/uL (ref 3.87–5.11)
RDW: 15.9 % — ABNORMAL HIGH (ref 11.5–15.5)
WBC: 5.3 K/uL (ref 4.0–10.5)
nRBC: 0 % (ref 0.0–0.2)

## 2023-11-21 LAB — COMPREHENSIVE METABOLIC PANEL WITH GFR
ALT: 21 U/L (ref 0–44)
AST: 23 U/L (ref 15–41)
Albumin: 3.7 g/dL (ref 3.5–5.0)
Alkaline Phosphatase: 58 U/L (ref 38–126)
Anion gap: 8 (ref 5–15)
BUN: 17 mg/dL (ref 6–20)
CO2: 24 mmol/L (ref 22–32)
Calcium: 8.5 mg/dL — ABNORMAL LOW (ref 8.9–10.3)
Chloride: 106 mmol/L (ref 98–111)
Creatinine, Ser: 0.65 mg/dL (ref 0.44–1.00)
GFR, Estimated: >60 mL/min (ref >60)
Glucose, Bld: 82 mg/dL (ref 70–99)
Potassium: 3.7 mmol/L (ref 3.5–5.1)
Sodium: 138 mmol/L (ref 135–145)
Total Bilirubin: 0.2 mg/dL (ref 0.0–1.2)
Total Protein: 7.2 g/dL (ref 6.5–8.1)

## 2023-11-21 LAB — TYPE AND SCREEN
ABO/RH(D): O POS
Antibody Screen: NEGATIVE

## 2023-11-22 ENCOUNTER — Other Ambulatory Visit: Payer: Self-pay

## 2023-11-22 ENCOUNTER — Encounter
Admission: RE | Disposition: A | Discharge status: Home / Self Care | Payer: Self-pay | Source: Home / Self Care | Attending: Obstetrics and Gynecology

## 2023-11-22 ENCOUNTER — Ambulatory Visit
Admission: RE | Admit: 2023-11-22 | Discharge: 2023-11-22 | Disposition: A | Discharge status: Home / Self Care | Attending: Obstetrics and Gynecology | Admitting: Obstetrics and Gynecology

## 2023-11-22 ENCOUNTER — Encounter: Payer: Self-pay | Admitting: Obstetrics and Gynecology

## 2023-11-22 ENCOUNTER — Ambulatory Visit: Payer: Self-pay | Admitting: Urgent Care

## 2023-11-22 DIAGNOSIS — Z793 Long term (current) use of hormonal contraceptives: Secondary | ICD-10-CM | POA: Diagnosis not present

## 2023-11-22 DIAGNOSIS — Z79624 Long term (current) use of inhibitors of nucleotide synthesis: Secondary | ICD-10-CM | POA: Insufficient documentation

## 2023-11-22 DIAGNOSIS — D251 Intramural leiomyoma of uterus: Secondary | ICD-10-CM | POA: Diagnosis present

## 2023-11-22 DIAGNOSIS — N888 Other specified noninflammatory disorders of cervix uteri: Secondary | ICD-10-CM | POA: Diagnosis not present

## 2023-11-22 DIAGNOSIS — Z01818 Encounter for other preprocedural examination: Secondary | ICD-10-CM

## 2023-11-22 DIAGNOSIS — N921 Excessive and frequent menstruation with irregular cycle: Secondary | ICD-10-CM | POA: Diagnosis present

## 2023-11-22 DIAGNOSIS — Z1502 Genetic susceptibility to malignant neoplasm of ovary: Secondary | ICD-10-CM | POA: Diagnosis not present

## 2023-11-22 DIAGNOSIS — K219 Gastro-esophageal reflux disease without esophagitis: Secondary | ICD-10-CM | POA: Insufficient documentation

## 2023-11-22 DIAGNOSIS — Z79890 Hormone replacement therapy (postmenopausal): Secondary | ICD-10-CM | POA: Diagnosis not present

## 2023-11-22 DIAGNOSIS — N838 Other noninflammatory disorders of ovary, fallopian tube and broad ligament: Secondary | ICD-10-CM | POA: Diagnosis not present

## 2023-11-22 DIAGNOSIS — Z1509 Genetic susceptibility to other malignant neoplasm: Secondary | ICD-10-CM | POA: Insufficient documentation

## 2023-11-22 DIAGNOSIS — E669 Obesity, unspecified: Secondary | ICD-10-CM | POA: Diagnosis not present

## 2023-11-22 DIAGNOSIS — Z79899 Other long term (current) drug therapy: Secondary | ICD-10-CM | POA: Diagnosis not present

## 2023-11-22 DIAGNOSIS — Z1589 Genetic susceptibility to other disease: Secondary | ICD-10-CM

## 2023-11-22 DIAGNOSIS — N8302 Follicular cyst of left ovary: Secondary | ICD-10-CM | POA: Insufficient documentation

## 2023-11-22 DIAGNOSIS — D252 Subserosal leiomyoma of uterus: Secondary | ICD-10-CM | POA: Insufficient documentation

## 2023-11-22 DIAGNOSIS — D500 Iron deficiency anemia secondary to blood loss (chronic): Secondary | ICD-10-CM | POA: Insufficient documentation

## 2023-11-22 DIAGNOSIS — Z6832 Body mass index (BMI) 32.0-32.9, adult: Secondary | ICD-10-CM | POA: Diagnosis not present

## 2023-11-22 DIAGNOSIS — M5430 Sciatica, unspecified side: Secondary | ICD-10-CM | POA: Insufficient documentation

## 2023-11-22 HISTORY — DX: Intramural leiomyoma of uterus: D25.1

## 2023-11-22 LAB — POCT PREGNANCY, URINE: Preg Test, Ur: NEGATIVE

## 2023-11-22 SURGERY — HYSTERECTOMY, TOTAL, ROBOT-ASSISTED, LAPAROSCOPIC, WITH BILATERAL SALPINGO-OOPHORECTOMY
Anesthesia: General | Site: Uterus

## 2023-11-22 MED ORDER — BUPIVACAINE HCL (PF) 0.5 % IJ SOLN
INTRAMUSCULAR | Status: AC
  Filled 2023-11-22: qty 30

## 2023-11-22 MED ORDER — ONDANSETRON 4 MG PO TBDP: 4.0000 mg | ORAL_TABLET | Freq: Four times a day (QID) | ORAL | 0 refills | Status: AC | PRN

## 2023-11-22 MED ORDER — POVIDONE-IODINE 10 % EX SWAB
2.0000 | Freq: Once | CUTANEOUS | Status: AC
  Administered 2023-11-22: 2 via TOPICAL

## 2023-11-22 MED ORDER — GLYCOPYRROLATE 0.2 MG/ML IJ SOLN
INTRAMUSCULAR | Status: AC
  Filled 2023-11-22: qty 1

## 2023-11-22 MED ORDER — OXYCODONE-ACETAMINOPHEN 5-325 MG PO TABS: 1.0000 | ORAL_TABLET | Freq: Four times a day (QID) | ORAL | 0 refills | Status: DC | PRN

## 2023-11-22 MED ORDER — ROCURONIUM BROMIDE 100 MG/10ML IV SOLN
INTRAVENOUS | Status: DC | PRN
  Administered 2023-11-22: 70 mg via INTRAVENOUS
  Administered 2023-11-22: 20 mg via INTRAVENOUS

## 2023-11-22 MED ORDER — LIDOCAINE HCL (PF) 2 % IJ SOLN
INTRAMUSCULAR | Status: AC
Start: 2023-11-22 — End: 2023-11-22
  Filled 2023-11-22: qty 5

## 2023-11-22 MED ORDER — KETOROLAC TROMETHAMINE 30 MG/ML IJ SOLN
INTRAMUSCULAR | Status: AC
  Filled 2023-11-22: qty 1

## 2023-11-22 MED ORDER — ONDANSETRON HCL 4 MG/2ML IJ SOLN
INTRAMUSCULAR | Status: AC
  Filled 2023-11-22: qty 2

## 2023-11-22 MED ORDER — SOD CITRATE-CITRIC ACID 500-334 MG/5ML PO SOLN
30.0000 mL | ORAL | Status: DC
  Filled 2023-11-22: qty 30

## 2023-11-22 MED ORDER — PROPOFOL 10 MG/ML IV BOLUS
INTRAVENOUS | Status: AC
  Filled 2023-11-22: qty 40

## 2023-11-22 MED ORDER — IBUPROFEN 600 MG PO TABS: 600.0000 mg | ORAL_TABLET | Freq: Four times a day (QID) | ORAL | 0 refills | Status: DC

## 2023-11-22 MED ORDER — PROPOFOL 1000 MG/100ML IV EMUL
INTRAVENOUS | Status: AC
  Filled 2023-11-22: qty 100

## 2023-11-22 MED ORDER — BUPIVACAINE HCL 0.5 % IJ SOLN
INTRAMUSCULAR | Status: DC | PRN
  Administered 2023-11-22: 10 mL

## 2023-11-22 MED ORDER — ACETAMINOPHEN 10 MG/ML IV SOLN
INTRAVENOUS | Status: DC | PRN
  Administered 2023-11-22: 1000 mg via INTRAVENOUS

## 2023-11-22 MED ORDER — CEFAZOLIN SODIUM-DEXTROSE 2-4 GM/100ML-% IV SOLN
2.0000 g | INTRAVENOUS | Status: AC
  Administered 2023-11-22: 2 g via INTRAVENOUS

## 2023-11-22 MED ORDER — ORAL CARE MOUTH RINSE: 15.0000 mL | Freq: Once | OROMUCOSAL | Status: AC

## 2023-11-22 MED ORDER — SODIUM CHLORIDE 0.9 % IV SOLN: INTRAVENOUS | Status: DC

## 2023-11-22 MED ORDER — ACETAMINOPHEN 10 MG/ML IV SOLN
INTRAVENOUS | Status: AC
  Filled 2023-11-22: qty 100

## 2023-11-22 MED ORDER — OXYCODONE HCL 5 MG PO TABS
ORAL_TABLET | ORAL | Status: AC
  Filled 2023-11-22: qty 1

## 2023-11-22 MED ORDER — SUGAMMADEX SODIUM 200 MG/2ML IV SOLN
INTRAVENOUS | Status: DC | PRN
  Administered 2023-11-22: 200 mg via INTRAVENOUS

## 2023-11-22 MED ORDER — GLYCOPYRROLATE 0.2 MG/ML IJ SOLN
INTRAMUSCULAR | Status: DC | PRN
  Administered 2023-11-22: .1 mg via INTRAVENOUS

## 2023-11-22 MED ORDER — OXYCODONE HCL 5 MG/5ML PO SOLN: 5.0000 mg | Freq: Once | ORAL | Status: AC | PRN

## 2023-11-22 MED ORDER — CEFAZOLIN SODIUM-DEXTROSE 2-4 GM/100ML-% IV SOLN
INTRAVENOUS | Status: AC
Start: 2023-11-22 — End: 2023-11-22
  Filled 2023-11-22: qty 100

## 2023-11-22 MED ORDER — KETOROLAC TROMETHAMINE 30 MG/ML IJ SOLN
INTRAMUSCULAR | Status: DC | PRN
Start: 2023-11-22 — End: 2023-11-22
  Administered 2023-11-22: 30 mg via INTRAVENOUS

## 2023-11-22 MED ORDER — FENTANYL CITRATE (PF) 100 MCG/2ML IJ SOLN: 25.0000 ug | INTRAMUSCULAR | Status: DC | PRN

## 2023-11-22 MED ORDER — DEXMEDETOMIDINE HCL IN NACL 80 MCG/20ML IV SOLN
INTRAVENOUS | Status: DC | PRN
  Administered 2023-11-22 (×3): 4 ug via INTRAVENOUS
  Administered 2023-11-22: 8 ug via INTRAVENOUS

## 2023-11-22 MED ORDER — PROPOFOL 500 MG/50ML IV EMUL
INTRAVENOUS | Status: DC | PRN
  Administered 2023-11-22: 125 ug/kg/min via INTRAVENOUS

## 2023-11-22 MED ORDER — ONDANSETRON HCL 4 MG/2ML IJ SOLN
INTRAMUSCULAR | Status: DC | PRN
  Administered 2023-11-22: 4 mg via INTRAVENOUS

## 2023-11-22 MED ORDER — ROCURONIUM BROMIDE 10 MG/ML (PF) SYRINGE
PREFILLED_SYRINGE | INTRAVENOUS | Status: AC
Start: 2023-11-22 — End: 2023-11-22
  Filled 2023-11-22: qty 10

## 2023-11-22 MED ORDER — 0.9 % SODIUM CHLORIDE (POUR BTL) OPTIME
TOPICAL | Status: DC | PRN
  Administered 2023-11-22: 500 mL

## 2023-11-22 MED ORDER — CHLORHEXIDINE GLUCONATE 0.12 % MT SOLN
15.0000 mL | Freq: Once | OROMUCOSAL | Status: AC
  Administered 2023-11-22: 15 mL via OROMUCOSAL

## 2023-11-22 MED ORDER — FENTANYL CITRATE (PF) 100 MCG/2ML IJ SOLN
INTRAMUSCULAR | Status: AC
  Filled 2023-11-22: qty 2

## 2023-11-22 MED ORDER — MIDAZOLAM HCL 2 MG/2ML IJ SOLN
INTRAMUSCULAR | Status: DC | PRN
  Administered 2023-11-22: 2 mg via INTRAVENOUS

## 2023-11-22 MED ORDER — OXYCODONE HCL 5 MG PO TABS
5.0000 mg | ORAL_TABLET | Freq: Once | ORAL | Status: AC | PRN
  Administered 2023-11-22: 5 mg via ORAL

## 2023-11-22 MED ORDER — PROPOFOL 1000 MG/100ML IV EMUL
INTRAVENOUS | Status: AC
Start: 2023-11-22 — End: 2023-11-22
  Filled 2023-11-22: qty 100

## 2023-11-22 MED ORDER — SODIUM CHLORIDE 0.9 % IR SOLN
Status: DC | PRN
  Administered 2023-11-22: 1000 mL

## 2023-11-22 MED ORDER — MIDAZOLAM HCL 2 MG/2ML IJ SOLN
INTRAMUSCULAR | Status: AC
  Filled 2023-11-22: qty 2

## 2023-11-22 MED ORDER — DEXAMETHASONE SODIUM PHOSPHATE 10 MG/ML IJ SOLN
INTRAMUSCULAR | Status: DC | PRN
Start: 2023-11-22 — End: 2023-11-22
  Administered 2023-11-22: 10 mg via INTRAVENOUS

## 2023-11-22 MED ORDER — FENTANYL CITRATE (PF) 100 MCG/2ML IJ SOLN
INTRAMUSCULAR | Status: DC | PRN
  Administered 2023-11-22: 50 ug via INTRAVENOUS
  Administered 2023-11-22: 100 ug via INTRAVENOUS
  Administered 2023-11-22: 50 ug via INTRAVENOUS

## 2023-11-22 MED ORDER — DEXAMETHASONE SODIUM PHOSPHATE 10 MG/ML IJ SOLN
INTRAMUSCULAR | Status: AC
  Filled 2023-11-22: qty 1

## 2023-11-22 MED ORDER — LACTATED RINGERS IV SOLN: INTRAVENOUS | Status: DC

## 2023-11-22 MED ORDER — CHLORHEXIDINE GLUCONATE 0.12 % MT SOLN
OROMUCOSAL | Status: AC
  Filled 2023-11-22: qty 15

## 2023-11-22 MED ORDER — PROPOFOL 10 MG/ML IV BOLUS
INTRAVENOUS | Status: DC | PRN
  Administered 2023-11-22: 180 mg via INTRAVENOUS

## 2023-11-22 SURGICAL SUPPLY — 60 items
BAG URINE DRAIN 2000ML AR STRL (UROLOGICAL SUPPLIES) ×2 IMPLANT
BLADE SURG 15 STRL LF DISP TIS (BLADE) ×2 IMPLANT
BLADE SURG SZ11 CARB STEEL (BLADE) ×2 IMPLANT
CANNULA CAP OBTURATR AIRSEAL 8 (CAP) ×2 IMPLANT
CATH URTH 16FR FL 2W BLN LF (CATHETERS) ×2 IMPLANT
COVER TIP SHEARS 8 DVNC (MISCELLANEOUS) ×2 IMPLANT
DEFOGGER SCOPE WARM SEASHARP (MISCELLANEOUS) IMPLANT
DERMABOND ADVANCED .7 DNX12 (GAUZE/BANDAGES/DRESSINGS) ×2 IMPLANT
DRAPE ARM DVNC X/XI (DISPOSABLE) ×8 IMPLANT
DRAPE COLUMN DVNC XI (DISPOSABLE) ×2 IMPLANT
DRAPE UNDER BUTTOCK W/FLU (DRAPES) ×2 IMPLANT
DRIVER NDL MEGA SUTCUT DVNCXI (INSTRUMENTS) ×2 IMPLANT
DRIVER NDLE MEGA SUTCUT DVNCXI (INSTRUMENTS) ×2 IMPLANT
ELECTRODE REM PT RTRN 9FT ADLT (ELECTROSURGICAL) ×2 IMPLANT
FORCEPS BPLR FENES DVNC XI (FORCEP) ×2 IMPLANT
FORCEPS BPLR R/ABLATION 8 DVNC (INSTRUMENTS) ×2 IMPLANT
GAUZE 4X4 16PLY ~~LOC~~+RFID DBL (SPONGE) ×2 IMPLANT
GLOVE BIO SURGEON STRL SZ7 (GLOVE) ×6 IMPLANT
GLOVE BIO SURGEON STRL SZ7.5 (GLOVE) IMPLANT
GLOVE BIOGEL PI IND STRL 7.5 (GLOVE) ×6 IMPLANT
GLOVE PI ORTHO PRO STRL 7.5 (GLOVE) IMPLANT
GOWN STRL REUS W/ TWL LRG LVL3 (GOWN DISPOSABLE) ×6 IMPLANT
GOWN STRL REUS W/ TWL XL LVL3 (GOWN DISPOSABLE) ×2 IMPLANT
IRRIGATION STRYKERFLOW (MISCELLANEOUS) IMPLANT
IRRIGATOR SUCT 8 DISP DVNC XI (IRRIGATION / IRRIGATOR) IMPLANT
IV LACTATED RINGERS 1000ML (IV SOLUTION) ×2 IMPLANT
IV NS 1000ML BAXH (IV SOLUTION) ×2 IMPLANT
KIT PINK PAD W/HEAD ARM REST (MISCELLANEOUS) ×2 IMPLANT
KIT TURNOVER CYSTO (KITS) ×2 IMPLANT
LABEL OR SOLS (LABEL) ×2 IMPLANT
MANIFOLD NEPTUNE II (INSTRUMENTS) ×2 IMPLANT
MANIPULATOR VCARE LG CRV RETR (MISCELLANEOUS) IMPLANT
MANIPULATOR VCARE STD CRV RETR (MISCELLANEOUS) IMPLANT
NDL 21 GA WING INFUSION (NEEDLE) IMPLANT
NDL HYPO 22X1.5 SAFETY MO (MISCELLANEOUS) ×2 IMPLANT
NEEDLE 21 GA WING INFUSION (NEEDLE) IMPLANT
NEEDLE HYPO 22X1.5 SAFETY MO (MISCELLANEOUS) ×2 IMPLANT
NS IRRIG 500ML POUR BTL (IV SOLUTION) ×2 IMPLANT
OBTURATOR OPTICALSTD 8 DVNC (TROCAR) ×2 IMPLANT
OCCLUDER COLPOPNEUMO (BALLOONS) ×2 IMPLANT
PACK GYN LAPAROSCOPIC (MISCELLANEOUS) ×2 IMPLANT
PAD PREP OB/GYN DISP 24X41 (PERSONAL CARE ITEMS) ×2 IMPLANT
SCISSORS MNPLR CVD DVNC XI (INSTRUMENTS) ×2 IMPLANT
SCRUB CHG 4% DYNA-HEX 4OZ (MISCELLANEOUS) ×2 IMPLANT
SEAL UNIV 5-12 XI (MISCELLANEOUS) ×4 IMPLANT
SEALER VESSEL EXT DVNC XI (MISCELLANEOUS) IMPLANT
SET CYSTO W/LG BORE CLAMP LF (SET/KITS/TRAYS/PACK) ×2 IMPLANT
SET TUBE FILTERED XL AIRSEAL (SET/KITS/TRAYS/PACK) ×2 IMPLANT
SOLUTION ELECTROSURG ANTI STCK (MISCELLANEOUS) ×2 IMPLANT
SOLUTION PREP PVP 2OZ (MISCELLANEOUS) ×2 IMPLANT
SPONGE T-LAP 18X18 ~~LOC~~+RFID (SPONGE) IMPLANT
SURGILUBE 2OZ TUBE FLIPTOP (MISCELLANEOUS) ×2 IMPLANT
SUT STRATA PDS 0 30 CT-2.5 (SUTURE) ×2 IMPLANT
SUT STRATAFIX SPIRAL PDS+ 0 30 (SUTURE) IMPLANT
SUTURE MNCRL 4-0 27XMF (SUTURE) ×2 IMPLANT
SYR 10ML LL (SYRINGE) ×2 IMPLANT
SYR 50ML LL SCALE MARK (SYRINGE) ×2 IMPLANT
TRAP FLUID SMOKE EVACUATOR (MISCELLANEOUS) ×2 IMPLANT
TUBING ART PRESS 48 MALE/FEM (TUBING) IMPLANT
WATER STERILE IRR 500ML POUR (IV SOLUTION) ×2 IMPLANT

## 2023-11-22 NOTE — Transfer of Care (Signed)
 Immediate Anesthesia Transfer of Care Note  [name]  Procedure(s) Performed: HYSTERECTOMY, TOTAL, ROBOT-ASSISTED, LAPAROSCOPIC, WITH Left OOPHORECTOMY; Pelvic washing (Left: Uterus) CYSTOSCOPY (Bladder)  Patient Location: PACU  Anesthesia Type:General  Level of Consciousness: drowsy  Airway & Oxygen Therapy: Patient Spontanous Breathing and Patient connected to face mask oxygen  Post-op Assessment: Report given to RN and Post -op Vital signs reviewed and stable  Post vital signs: Reviewed and stable  Last Vitals:  Vitals Value Taken Time  BP 112/72 11/22/23 10:01  Temp    Pulse 78 11/22/23 10:10  Resp 15 11/22/23 10:10  SpO2 83 % 11/22/23 10:10  Vitals shown include unfiled device data.  Last Pain: There were no vitals filed for this visit.       Complications: No notable events documented.

## 2023-11-22 NOTE — Interval H&P Note (Signed)
 History and Physical Interval Note:  11/22/2023 7:17 AM  Alicia Carr  has presented today for surgery, with the diagnosis of menorrhagia with irregular cycle Anemia Fibroid uterus RAD51C Gene Mutation.  The various methods of treatment have been discussed with the patient and family. After consideration of risks, benefits and other options for treatment, the patient has consented to  Procedure(s) with comments: HYSTERECTOMY, TOTAL, ROBOT-ASSISTED, LAPAROSCOPIC, WITH BILATERAL SALPINGO-OOPHORECTOMY (Left) - LEFT OOPHORECTOMY, PELVIC WASHING CYSTOSCOPY (N/A) as a surgical intervention.  The patient's history has been reviewed, patient examined, no change in status, stable for surgery.  I have reviewed the patient's chart and labs.  Questions were answered to the patient's satisfaction.    Garnette Mace, MD, [facility] OB/GYN 11/22/2023 7:18 AM

## 2023-11-22 NOTE — Anesthesia Postprocedure Evaluation (Signed)
 Anesthesia Post Note  [name]  Procedure(s) Performed: HYSTERECTOMY, TOTAL, ROBOT-ASSISTED, LAPAROSCOPIC, WITH Left OOPHORECTOMY; Pelvic washing (Left: Uterus) CYSTOSCOPY (Bladder)  Patient location during evaluation: PACU Anesthesia Type: General Level of consciousness: awake and alert Pain management: pain level controlled Vital Signs Assessment: post-procedure vital signs reviewed and stable Respiratory status: spontaneous breathing, nonlabored ventilation and respiratory function stable Cardiovascular status: blood pressure returned to baseline and stable Postop Assessment: no apparent nausea or vomiting Anesthetic complications: no   No notable events documented.   Last Vitals:  Vitals:   11/22/23 1030 11/22/23 1100  BP: 122/85 129/80  Pulse:  64  Resp: 12 16  Temp:  (!) 36.1 C  SpO2: 100% 97%    Last Pain:  Vitals:   11/22/23 1100  TempSrc: Temporal  PainSc: 2                  Fairy POUR Avryl Roehm

## 2023-11-22 NOTE — Anesthesia Preprocedure Evaluation (Signed)
 Anesthesia Evaluation  Patient identified by MRN, date of birth, ID band Patient awake    Reviewed: Allergy & Precautions, NPO status , Patient's Chart, lab work & pertinent test results  Airway Mallampati: III  TM Distance: >3 FB Neck ROM: full    Dental  (+) Chipped   Pulmonary neg pulmonary ROS, neg shortness of breath   Pulmonary exam normal        Cardiovascular Exercise Tolerance: Good (-) angina negative cardio ROS Normal cardiovascular exam     Neuro/Psych negative neurological ROS     GI/Hepatic Neg liver ROS,GERD  Controlled,,  Endo/Other  negative endocrine ROS    Renal/GU      Musculoskeletal   Abdominal   Peds  Hematology negative hematology ROS (+)   Anesthesia Other Findings Past Medical History: No date: Alcoholism (HCC) No date: Anemia No date: Anxiety No date: Early hepatic fibrosis No date: Family history of ovarian cancer No date: Family history of prostate cancer No date: Fibroid uterus No date: Gallstones 11/29/2021: Genetic testing No date: GERD (gastroesophageal reflux disease) No date: History of chicken pox No date: Hyperpigmentation 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 No date: Menorrhagia with irregular cycle No date: Nevus     Comment:  right dorsal wrist No date: Positive ANA (antinuclear antibody) No date: Primary sclerosing cholangitis No date: Pruritus No date: RAD51C gene mutation positive No date: Raynaud's syndrome 07/18/2020: Shingles 2003: Vaginal delivery  Past Surgical History: 12/03/2021: BILATERAL SALPINGECTOMY     Comment:  at time of RSO 08/08/2006: CESAREAN SECTION 2003: CHOLECYSTECTOMY 09/2020: COLONOSCOPY 10/17/2022: CYSTOSCOPY     Comment:  [facility], Microscopic Hematuria, Repeat Micro UA              in 1  year. 05/2020: LIVER BIOPSY 12/03/2021: ROBOTIC ASSISTED SALPINGO OOPHERECTOMY; Bilateral     Comment:  Procedure: XI ROBOTIC  ASSISTED BILATERAL SALPINGECTOMY               AND RIGHT OOPHORECTOMY;  Surgeon: Leonce Garnette BIRCH, MD;              Location: [facility] ORS;  Service: Gynecology;  Laterality:               Bilateral;  BMI    Body Mass Index: 32.12 kg/m      Reproductive/Obstetrics negative OB ROS                              Anesthesia Physical Anesthesia Plan  ASA: 2  Anesthesia Plan: General ETT   Post-op Pain Management:    Induction: Intravenous  PONV Risk Score and Plan: Ondansetron , Dexamethasone , Midazolam  and Treatment may vary due to age or medical condition  Airway Management Planned: Oral ETT  Additional Equipment:   Intra-op Plan:   Post-operative Plan: Extubation in OR  Informed Consent: I have reviewed the patients History and Physical, chart, labs and discussed the procedure including the risks, benefits and alternatives for the proposed anesthesia with the patient or authorized representative who has indicated his/her understanding and acceptance.     Dental Advisory Given  Plan Discussed with: Anesthesiologist, CRNA and Surgeon  Anesthesia Plan Comments: (Patient consented for risks of anesthesia including but not limited to:  - adverse reactions to medications - damage to eyes, teeth, lips or other oral mucosa - nerve damage due to positioning  - sore throat or hoarseness - Damage to heart, brain, nerves, lungs, other parts of body or  loss of life  Patient voiced understanding and assent.)        Anesthesia Quick Evaluation

## 2023-11-22 NOTE — Anesthesia Procedure Notes (Signed)
 Procedure Name: Intubation Date/Time: 11/22/2023 7:41 AM  Performed by: Yahsir Wickens, CRNAPre-anesthesia Checklist: Patient identified, Emergency Drugs available, Suction available and Patient being monitored Patient Re-evaluated:Patient Re-evaluated prior to induction Oxygen Delivery Method: Circle System Utilized Preoxygenation: Pre-oxygenation with 100% oxygen Induction Type: IV induction Ventilation: Mask ventilation without difficulty Laryngoscope Size: Mac and 3 Grade View: Grade I Tube type: Oral Tube size: 7.0 mm Number of attempts: 1 Airway Equipment and Method: Stylet and Oral airway Placement Confirmation: ETT inserted through vocal cords under direct vision, positive ETCO2 and breath sounds checked- equal and bilateral Secured at: 21 cm Tube secured with: Tape Dental Injury: Teeth and Oropharynx as per pre-operative assessment  Comments: Easy, atraumatic intubation. Head and neck midline. Lips, teeth, and tongue unchanged. Soft bite block placed and tongue, lips free from pressure.

## 2023-11-22 NOTE — Op Note (Signed)
 Operative Note    Name: Alicia Carr  Date of Service: 11/22/2023   DOB: [birth year]  MRN: [id number]    Pre-Operative Diagnosis:  1) menorrhagia with irregular cycle 2) intramural leiomyoma of the uterus 3) anemia due to chronic blood loss 4) Pathologic RAD51C gene mutation   Post-Operative Diagnosis:  1) menorrhagia with irregular cycle 2) intramural leiomyoma of the uterus 3) anemia due to chronic blood loss 4) Pathologic RAD51C gene mutation  Procedures:  1) Robot assisted Total Laparoscopic Hysterectomy, left oophorectomy 2) Cystoscopy  Primary Surgeon: Garnette Mace, MD  Assistant Surgeon: Heather Penton, MD   EBL: 15 mL   IVF: 800 mL   Urine output: 100 mL  Specimens:  1) Uterus, cervix, and left ovary 2) pelvic washing  Drains: none  Complications: None   Disposition: PACU   Condition: Stable   Findings:  1) enlarged, globular uterus, normal appearing cervix and left ovary 2) surgically absent right ovary and bilateral fallopian tubes 3) normal cystoscopy without evidence of bladder wall injury with appreciation of efflux of urine from the bilateral ureteral orifices.  Procedure Summary:  The patient was taken to the operating room where general anesthesia was administered and found to be adequate. She was placed in the dorsal supine lithotomy position in [place] stirrups and prepped and draped in the usual, sterile fashion. After a timeout was called an indwelling catheter was placed in her bladder.  A sterile speculum was placed in her vagina.  The anterior lip of the cervix was grasped with the single-tooth tenaculum.  The cervix was serially dilated to an 11 Pratt dilator.  The large Vcare device was placed in accordance to the manufacturer's recommendations.  The tenaculum and speculum were removed.   Attention was turned to the abdomen where after injection of local anesthetic, an 8 mm infraumbilical incision was made with the scalpel. Entry  into the abdomen was obtained via Optiview trocar technique (a blunt entry technique with camera visualization through the obturator upon entry). Verification of entry into the abdomen was obtained using opening pressures. The abdomen was insufflated with CO2. The camera was introduced through the trocar with verification of atraumatic entry.  Right and left abdominal entry sites were created after injection of local anesthetic about 8 cm lateral to the umbilical port in accordance with the Intuitive manufacturer's recommendations.  The port sites were 8 mm.  The intuitive trochars were introduced under intra-abdominal camera visualization without difficulty   The XI robot was docked on the patient's left.  Clearance was verified from the patient's legs.  Through the umbilical port the camera was placed.  Through the port attached to arm 4 the monopolar scissors were placed.  Through the port attached to arm 2 the forced bipolar forceps were was placed.     An inspection was undertaken of the pelvis with the above-noted findings.  Washings were obtained given her history of a pathologic RAD51C genetic mutation.  The bilateral ureters were identified and found to be well away from the operative area of interest. The round ligament was cauterized and transected on the left. The broad ligament was opened posteriorly and anteriorly.  The retroperitoneal space was dissected and the ureter was found in the retroperitoneal space in its normal location and the course of the ureter was mapped out.  The infundibulopelvic ligament was skeletonized after creating a posterior broad ligament opening well away from the ureter.  The infundibulopelvic ligament was cauterized and transected.  The remaining left broad  ligament was opened posteriorly and anteriorly to the level of the internal cervical os.  The left uterine artery was skeletonized.  The bladder flap was created without difficulty.  The left uterine artery was  cauterized and transected and dissected laterally away from the junction of the cervix and vagina.  The right round ligament was cauterized and transected and the right broad ligament was dissected to the level of the internal cervical os and the right uterine artery was skeletonized, cauterized, and transected.  The right uterine artery was dissected laterally in a similar fashion to the left side.  The colpotomy was performed in a circumferential fashion following the KOH ring.  The uterus, cervix, and left fallopian tube were removed from the vagina.  Closure of the vaginal cuff was undertaken using the 0 Stratafix on a CT2 stitch in a running fashion.  All vascular pedicles were inspected and found to be hemostatic after dropping the intra-abdominal pressure to 5 mmHg.  All instruments removed from the robotic ports.  The robot was undocked from the patient.  The abdomen was then desufflated of CO2 with the aid of 5 deep breaths from anesthesia.  All trochars were then removed.  All skin incisions were closed using 4-0 Monocryl in a subcuticular fashion and reinforced using surgical skin glue.   Cystoscopy was undertaken at this point. The Foley catheter was removed and the 70 cystoscope was gently introduced through the urethra. The bladder survey was undertaken with efflux of urine from both orifices noted. There were no defects noted in the bladder wall. The cystoscope was utilized to mostly empty the bladder.  A digital sweep of the vagina was undertaken to ensure no instruments or sponges remained and this was verified to be clear.  The speculum was placed in the vagina and the vaginal cuff was visualized and found to be hemostatic.  As the speculum was being removed visualization to ensure that no instruments or sponges remained was also performed.  The assistant in this case performed uterine manipulation, removal of the specimen, cystoscopy.  The patient tolerated the procedure well.  Sponge,  lap, needle, and instrument counts were correct x 2.  VTE prophylaxis: SCDs. Antibiotic prophylaxis: Ancef  2 grams IV prior to skin incision. She was awakened in the operating room and was taken to the PACU in stable condition.  The assistant in this case was an MD due to the lack of availability of another Sales promotion account executive.  Garnette Mace, MD, [facility] 11/22/2023 9:56 AM

## 2023-11-23 ENCOUNTER — Encounter: Payer: Self-pay | Admitting: Obstetrics and Gynecology

## 2023-11-23 ENCOUNTER — Encounter: Payer: Self-pay | Admitting: Internal Medicine

## 2023-11-23 ENCOUNTER — Encounter: Payer: Self-pay | Admitting: Hematology and Oncology

## 2023-11-23 ENCOUNTER — Encounter: Payer: Self-pay | Admitting: Physician Assistant

## 2023-11-24 LAB — SURGICAL PATHOLOGY

## 2023-11-24 LAB — CYTOLOGY - NON PAP

## 2023-12-05 ENCOUNTER — Ambulatory Visit
Admission: RE | Admit: 2023-12-05 | Discharge: 2023-12-05 | Disposition: A | Discharge status: Home / Self Care | Source: Ambulatory Visit | Attending: Hematology and Oncology | Admitting: Hematology and Oncology

## 2023-12-05 DIAGNOSIS — Z1231 Encounter for screening mammogram for malignant neoplasm of breast: Secondary | ICD-10-CM | POA: Diagnosis present

## 2023-12-09 ENCOUNTER — Encounter: Payer: Self-pay | Admitting: Cardiovascular Disease

## 2023-12-18 ENCOUNTER — Telehealth: Payer: Self-pay

## 2023-12-18 NOTE — Telephone Encounter (Signed)
 Alicia Carr called the office stating she was seen by Dr.Newton 12/05/22. It was recommended she return in 1 year.   She recently had a hysterectomy and cystoscopy on 11/22/23 with her OB/GYN at [facility] in [place]. She is isn't sure if she still needs to follow up with Dr.Newton. However, she does have a few things she wants to discuss,including a few questions about her pathology.   Pt is fine with a phone visit or in person appointment. she scheduled for in person visit on 11/10 @ 4:00.(Just to hold a spot) If Dr.Newton thinks she could have a phone visit instead,that would be ok as well.

## 2023-12-19 NOTE — Telephone Encounter (Signed)
 LVM for Alicia Carr to call office regarding message from Dr.Newton and scheduling a phone visit.

## 2023-12-19 NOTE — Telephone Encounter (Signed)
 Ms.Cubillos returned call, she agrees to a phone visit with Dr.Newton. First available appointment is 11/10 @ 4:30.

## 2023-12-21 ENCOUNTER — Ambulatory Visit: Payer: Managed Care, Other (non HMO) | Admitting: Physician Assistant

## 2023-12-21 ENCOUNTER — Encounter: Payer: Self-pay | Admitting: Physician Assistant

## 2023-12-21 VITALS — BP 120/78 | HR 73 | Temp 97.2°F | Ht 61.0 in | Wt 171.2 lb

## 2023-12-21 DIAGNOSIS — L680 Hirsutism: Secondary | ICD-10-CM

## 2023-12-21 DIAGNOSIS — Z6832 Body mass index (BMI) 32.0-32.9, adult: Secondary | ICD-10-CM

## 2023-12-21 DIAGNOSIS — R69 Illness, unspecified: Secondary | ICD-10-CM

## 2023-12-21 DIAGNOSIS — E039 Hypothyroidism, unspecified: Secondary | ICD-10-CM

## 2023-12-21 DIAGNOSIS — E785 Hyperlipidemia, unspecified: Secondary | ICD-10-CM

## 2023-12-21 DIAGNOSIS — Z23 Encounter for immunization: Secondary | ICD-10-CM | POA: Diagnosis not present

## 2023-12-21 DIAGNOSIS — E669 Obesity, unspecified: Secondary | ICD-10-CM | POA: Diagnosis not present

## 2023-12-21 DIAGNOSIS — K8301 Primary sclerosing cholangitis: Secondary | ICD-10-CM

## 2023-12-21 DIAGNOSIS — Z87448 Personal history of other diseases of urinary system: Secondary | ICD-10-CM

## 2023-12-21 DIAGNOSIS — L83 Acanthosis nigricans: Secondary | ICD-10-CM

## 2023-12-21 DIAGNOSIS — D509 Iron deficiency anemia, unspecified: Secondary | ICD-10-CM

## 2023-12-21 DIAGNOSIS — K760 Fatty (change of) liver, not elsewhere classified: Secondary | ICD-10-CM

## 2023-12-21 DIAGNOSIS — Z0000 Encounter for general adult medical examination without abnormal findings: Secondary | ICD-10-CM | POA: Diagnosis not present

## 2023-12-21 LAB — HEMOGLOBIN A1C
Hgb A1c MFr Bld: 5.5 % (ref <5.7)
Mean Plasma Glucose: 111 mg/dL
eAG (mmol/L): 6.2 mmol/L

## 2023-12-21 LAB — CBC WITH DIFFERENTIAL/PLATELET
Absolute Lymphocytes: 1490 {cells}/uL (ref 850–3900)
Absolute Monocytes: 259 {cells}/uL (ref 200–950)
Basophils Absolute: 32 {cells}/uL (ref 0–200)
Basophils Relative: 0.6 %
Eosinophils Absolute: 103 {cells}/uL (ref 15–500)
Eosinophils Relative: 1.9 %
HCT: 39.5 % (ref 35.0–45.0)
Hemoglobin: 13.1 g/dL (ref 11.7–15.5)
MCH: 28 pg (ref 27.0–33.0)
MCHC: 33.2 g/dL (ref 32.0–36.0)
MCV: 84.4 fL (ref 80.0–100.0)
MPV: 10.5 fL (ref 7.5–12.5)
Monocytes Relative: 4.8 %
Neutro Abs: 3515 {cells}/uL (ref 1500–7800)
Neutrophils Relative %: 65.1 %
Platelets: 259 Thousand/uL (ref 140–400)
RBC: 4.68 Million/uL (ref 3.80–5.10)
RDW: 14.4 % (ref 11.0–15.0)
Total Lymphocyte: 27.6 %
WBC: 5.4 Thousand/uL (ref 3.8–10.8)

## 2023-12-21 LAB — LIPID PANEL
Cholesterol: 202 mg/dL — ABNORMAL HIGH (ref 0–200)
HDL: 65.7 mg/dL (ref >39.00)
LDL Cholesterol: 106 mg/dL — ABNORMAL HIGH (ref 0–99)
NonHDL: 136.2
Total CHOL/HDL Ratio: 3
Triglycerides: 149 mg/dL (ref 0.0–149.0)
VLDL: 29.8 mg/dL (ref 0.0–40.0)

## 2023-12-21 LAB — PHOSPHORUS: Phosphorus: 4 mg/dL (ref 2.3–4.6)

## 2023-12-21 LAB — COMPREHENSIVE METABOLIC PANEL WITH GFR
ALT: 17 U/L (ref 0–35)
AST: 17 U/L (ref 0–37)
Albumin: 4.5 g/dL (ref 3.5–5.2)
Alkaline Phosphatase: 80 U/L (ref 39–117)
BUN: 19 mg/dL (ref 6–23)
CO2: 21 meq/L (ref 19–32)
Calcium: 8.9 mg/dL (ref 8.4–10.5)
Chloride: 103 meq/L (ref 96–112)
Creatinine, Ser: 0.74 mg/dL (ref 0.40–1.20)
GFR: 100.03 mL/min (ref >60.00)
Glucose, Bld: 93 mg/dL (ref 70–99)
Potassium: 4.7 meq/L (ref 3.5–5.1)
Sodium: 137 meq/L (ref 135–145)
Total Bilirubin: 0.3 mg/dL (ref 0.2–1.2)
Total Protein: 7.4 g/dL (ref 6.0–8.3)

## 2023-12-21 LAB — VITAMIN D 25 HYDROXY (VIT D DEFICIENCY, FRACTURES): VITD: 46.97 ng/mL (ref 30.00–100.00)

## 2023-12-21 MED ORDER — WEGOVY 0.25 MG/0.5ML ~~LOC~~ SOAJ: 0.2500 mg | SUBCUTANEOUS | 1 refills | Status: DC

## 2023-12-21 NOTE — Progress Notes (Signed)
 Subjective:    Alicia Carr is a 43 y.o. female and is here for a comprehensive physical exam.  HPI  There are no preventive care reminders to display for this patient.  Discussed the use of AI scribe software for clinical note transcription with the patient, who gave verbal consent to proceed.  History of Present Illness   Alicia Carr is a 43 year old female who presents for Comprehensive Physical Exam (CPE) preventive care annual visit and review of chronic medical issues.  She underwent a hysterectomy in September due to persistent bleeding and anemia caused by fibroids. Her last ovary was removed during the surgery. She began taking norethindrone and ethinyl estradiol tablets two weeks post-surgery. Since the surgery, she experiences daily headaches and sensations of feeling hot, though not as intense as typical hot flashes. Hormone therapy was initiated due to concerns about surgical menopause, but there has been no significant change in symptoms.  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 She experiences symptoms suggestive of hormonal imbalance, such as excessive hair growth on her face and stomach, despite normal androgen levels. Evaluations by endocrinologists and dermatologists have not resulted in a definitive diagnosis of PCOS or other hormonal disorders.  She has anemia with low ferritin levels, despite iron  supplementation and a recent infusion. Her hemoglobin levels dropped by the time of her surgery but not severe enough to keep her from having surgery. She experiences easy bruising and is concerned about potential causes, including Cymbalta , though her platelets are normal.  She has a family history of diabetes and is concerned about potential insulin resistance, having gained weight. She has been diagnosed with acanthosis nigricans. She has primary sclerosing cholangitis and fatty liver, with hepatic steatosis indicated by  FibroScan. She monitors her calcium and vitamin D  levels, supplementing with vitamin D  and vitamin C to aid iron  absorption.   She is currently walking daily for exercise. She has reduced her sugar intake dramatically with a focus on      Fib-4 11/21/23 /0.8 Stage 0-1 -- Advanced fibrosis excluded  Fibroscan 08/17/22 INTERPRETATION:  F0-F1 Hepatic fibrosis -- unlikely to have clinically significant portal hypertension  S2-S3 hepatic steatosis  -- moderate hepatic steatosis  Health Maintenance: Immunizations -- UpToDate  Colonoscopy -- UpToDate  Mammogram -- UpToDate  PAP -- n/a Bone Density -- n/a Diet -- see above Exercise -- see above  Sleep habits -- no major concerns Mood -- generalized healthy anxiety and stress with lack of answers  UTD with dentist? - no UTD with eye doctor? - yes  Weight history: Wt Readings from Last 10 Encounters:  12/21/23 171 lb 4 oz (77.7 kg)  11/22/23 170 lb (77.1 kg)  10/13/23 165 lb (74.8 kg)  09/08/23 164 lb (74.4 kg)  08/29/23 168 lb 12.8 oz (76.6 kg)  08/15/23 168 lb 12.8 oz (76.6 kg)  08/15/23 168 lb (76.2 kg)  08/10/23 165 lb 8 oz (75.1 kg)  05/10/23 165 lb (74.8 kg)  04/25/23 160 lb 3.2 oz (72.7 kg)   Body mass index is 32.36 kg/m. Patient's last menstrual period was 10/24/2023 (approximate).  Alcohol use:  reports current alcohol use of about 1.0 standard drink of alcohol per week.  Tobacco use:  Tobacco Use: Low Risk  (12/21/2023)   Patient History    Smoking Tobacco Use: Never    Smokeless Tobacco Use: Never    Passive Exposure: Past   Eligible for lung cancer screening? no  12/21/2023    9:23 AM  Depression screen PHQ 2/9  Decreased Interest 0  Down, Depressed, Hopeless 0  PHQ - 2 Score 0  Altered sleeping 3  Tired, decreased energy 2  Change in appetite 1  Feeling bad or failure about yourself  0  Trouble concentrating 0  Moving slowly or fidgety/restless 0  Suicidal thoughts 0  PHQ-9 Score 6   Difficult doing work/chores Not difficult at all     Other providers/specialists: Patient Care Team: Job Lukes, [place] as PCP - General (Physician Assistant)    PMHx, SurgHx, SocialHx, Medications, and Allergies were reviewed in the Visit Navigator and updated as appropriate.   Past Medical History:  Diagnosis Date   Alcoholism (HCC)    Anemia    Anxiety    Early hepatic fibrosis    Family history of ovarian cancer    Family history of prostate cancer    Fibroid uterus    Gallstones    Genetic testing 11/29/2021   GERD (gastroesophageal reflux disease)    History of chicken pox    Hyperlipidemia Apr 2023   I know both my Cholesterol and LDL are higher than normal, but I am not sure if this counts as hyperlipidemia or not   Hyperpigmentation    Longitudinal melanonychia    Menorrhagia with irregular cycle    Nevus    right dorsal wrist   Positive ANA (antinuclear antibody)    Primary sclerosing cholangitis (HCC)    Pruritus    RAD51C gene mutation positive    Raynaud's syndrome    Shingles 07/18/2020   Vaginal delivery 2003     Past Surgical History:  Procedure Laterality Date   ABDOMINAL HYSTERECTOMY  11/22/2023   BILATERAL SALPINGECTOMY  12/03/2021   at time of RSO   CESAREAN SECTION  08/08/2006   CHOLECYSTECTOMY  2003   COLONOSCOPY  09/2020   CYSTOSCOPY  10/17/2022   [facility], Microscopic Hematuria, Repeat Micro UA in 1  year.   CYSTOSCOPY N/A 11/22/2023   Procedure: CYSTOSCOPY;  Surgeon: Leonce Garnette BIRCH, MD;  Location: [facility] ORS;  Service: Gynecology;  Laterality: N/A;   LIVER BIOPSY  05/2020   ROBOTIC ASSISTED SALPINGO OOPHERECTOMY Bilateral 12/03/2021   Procedure: XI ROBOTIC ASSISTED BILATERAL SALPINGECTOMY AND RIGHT OOPHORECTOMY;  Surgeon: Leonce Garnette BIRCH, MD;  Location: [facility] OR[facility];  Service: Gynecology;  Laterality: Bilateral;   ROBOTIC ASSISTED TOTAL HYSTERECTOMY WITH BILATERAL SALPINGO OOPHERECTOMY Left 11/22/2023   Procedure:  HYSTERECTOMY, TOTAL, ROBOT-ASSISTED, LAPAROSCOPIC, WITH Left OOPHORECTOMY; Pelvic washing;  Surgeon: Leonce Garnette BIRCH, MD;  Location: [facility] ORS;  Service: Gynecology;  Laterality: Left;  LEFT OOPHORECTOMY, PELVIC WASHING     Family History  Problem Relation Age of Onset   Mental illness Mother        paranoid schizophrenia   Hyperlipidemia Mother    Diabetes Mother    Alcohol abuse Father    Colon polyps Father    Hypertension Father    Prostate cancer Father 70   Heart murmur Brother    Hypertension Brother    Cancer Maternal Grandmother        uncertain type   Healthy Daughter    Asthma Daughter    Learning disabilities Daughter    ADD / ADHD Daughter    Anxiety disorder Daughter    Healthy Son    Diabetes Paternal Aunt    Colon cancer Paternal Uncle    Ovarian cancer Paternal Great-grandmother    Ovarian cancer Other  PGF sister   Breast cancer Neg Hx    Endometrial cancer Neg Hx    Pancreatic cancer Neg Hx     Social History   Tobacco Use   Smoking status: Never    Passive exposure: Past   Smokeless tobacco: Never  Vaping Use   Vaping status: Never Used  Substance Use Topics   Alcohol use: Yes    Alcohol/week: 1.0 standard drink of alcohol    Types: 1 Glasses of wine per week    Comment: Drinking when going out to eat and special occasions only   Drug use: No    Review of Systems:   Review of Systems  Constitutional:  Negative for chills, fever, malaise/fatigue and weight loss.  HENT:  Negative for hearing loss, sinus pain and sore throat.   Respiratory:  Negative for cough and hemoptysis.   Cardiovascular:  Negative for chest pain, palpitations, leg swelling and PND.  Gastrointestinal:  Negative for abdominal pain, constipation, diarrhea, heartburn, nausea and vomiting.  Genitourinary:  Negative for dysuria, frequency and urgency.  Musculoskeletal:  Negative for back pain, myalgias and neck pain.  Skin:  Negative for itching and rash.   Neurological:  Negative for dizziness, tingling, seizures and headaches.  Endo/Heme/Allergies:  Negative for polydipsia.  Psychiatric/Behavioral:  Negative for depression. The patient is not nervous/anxious.     Objective:   BP 120/78 (BP Location: Left Arm, Patient Position: Sitting, Cuff Size: Large)   Pulse 73   Temp (!) 97.2 F (36.2 C) (Temporal)   Ht 5' 1 (1.549 m)   Wt 171 lb 4 oz (77.7 kg)   LMP 10/24/2023 (Approximate)   SpO2 99%   BMI 32.36 kg/m  Body mass index is 32.36 kg/m.   General Appearance:    Alert, cooperative, no distress, appears stated age  Head:    Normocephalic, without obvious abnormality, atraumatic  Eyes:    PERRL, conjunctiva/corneas clear, EOM's intact, fundi    benign, both eyes  Ears:    Normal TM's and external ear canals, both ears  Nose:   Nares normal, septum midline, mucosa normal, no drainage    or sinus tenderness  Throat:   Lips, mucosa, and tongue normal; teeth and gums normal  Neck:   Supple, symmetrical, trachea midline, no adenopathy;    thyroid :  no enlargement/tenderness/nodules; no carotid   bruit or JVD  Back:     Symmetric, no curvature, ROM normal, no CVA tenderness  Lungs:     Clear to auscultation bilaterally, respirations unlabored  Chest Wall:    No tenderness or deformity   Heart:    Regular rate and rhythm, S1 and S2 normal, no murmur, rub or gallop  Breast Exam:    Deferred  Abdomen:     Soft, non-tender, bowel sounds active all four quadrants,    no masses, no organomegaly  Genitalia:    Deferred  Extremities:   Extremities normal, atraumatic, no cyanosis or edema  Pulses:   2+ and symmetric all extremities  Skin:   Skin color, texture, turgor normal, no rashes or lesions  Lymph nodes:   Cervical, supraclavicular, and axillary nodes normal  Neurologic:   CNII-XII intact, normal strength, sensation and reflexes    throughout    Assessment/Plan:   Assessment and Plan    Comprehensive Physical Exam (CPE)  preventive care annual visit Today patient counseled on age appropriate routine health concerns for screening and prevention, each reviewed and up to date or declined. Immunizations reviewed and  up to date or declined. Labs ordered and reviewed. Risk factors for depression reviewed and negative. Hearing function and visual acuity are intact. ADLs screened and addressed as needed. Functional ability and level of safety reviewed and appropriate. Education, counseling and referrals performed based on assessed risks today. Patient provided with a copy of personalized plan for preventive services.  Obesity and nonalcoholic fatty liver disease Obesity at 171 pounds with hepatic steatosis on FibroScan. Wegovy trial considered for weight management and fatty liver disease. - Provide sample of Wegovy for trial use. - Submit insurance request for [facility] under obesity and fatty liver disease indications. - Document current diet and exercise regimen for insurance purposes.  Hirsutism and hepatic steatosis  Hirsutism and acanthosis nigricans with normal androgen levels. Symptoms suggest hormonal imbalance, possibly linked to metabolic syndrome and insulin resistance. - Consider starting spironolactone for hirsutism. - Order A1c to assess for insulin resistance. - Order yearly urinalysis to check for hematuria.  Hypocalcemia Recent incidental finding of low level Will recheck today as well as PTH  Iron  deficiency anemia Iron  deficiency anemia with decreased hemoglobin post-surgery. Menstrual bleeding no longer a factor post-hysterectomy. - Monitor hemoglobin levels. - Consider referral to gastroenterology for colonoscopy if anemia persists.  Primary sclerosing cholangitis Primary sclerosing cholangitis with hepatic pores on MRCP. No significant concern from hepatologist. Normal previous colonoscopy, repeat every five years recommended. - Consider repeat colonoscopy if indicated.     HLD Update lipid  panel and provide recommendations  History of hematuria Will update yearly urinalysis  If blood -- will refer back to urology  Multiple chronic diseases She would like referral to Medical Genetics to discuss all conditions to see if testing can be offered     Lucie Buttner, PA-C [facility]

## 2023-12-21 NOTE — Patient Instructions (Addendum)
  VISIT SUMMARY: You had a follow-up visit after your hysterectomy. We discussed your ongoing symptoms, including headaches, feeling hot, and hormonal imbalance signs. We also reviewed your anemia, weight management, and liver health.  YOUR PLAN: OBESITY AND NONALCOHOLIC FATTY LIVER DISEASE: You have obesity and fatty liver disease, which we are addressing with a new medication. -We will start a trial of Wegovy to help with weight management and fatty liver disease. -We will submit an insurance request for [facility]. -Please document your current diet and exercise regimen for insurance purposes.  HIRSUTISM AND ACANTHOSIS NIGRICANS: You have excessive hair growth and skin changes that may be related to hormonal imbalance and insulin resistance. -We are considering starting spironolactone to help reduce hair growth. -We will order an A1c test to check for insulin resistance. -We will order a yearly urinalysis to check for blood in your urine.  IRON  DEFICIENCY ANEMIA: You have anemia with low iron  levels, which we need to monitor closely. -We will monitor your hemoglobin levels. -If your anemia persists, we may refer you to a gastroenterologist for a colonoscopy.                     Contains text generated by Abridge.                                 Contains text generated by Abridge.

## 2023-12-23 ENCOUNTER — Encounter: Payer: Self-pay | Admitting: Physician Assistant

## 2023-12-23 DIAGNOSIS — E039 Hypothyroidism, unspecified: Secondary | ICD-10-CM

## 2023-12-23 LAB — PTH, INTACT AND CALCIUM
Calcium: 9.1 mg/dL (ref 8.6–10.2)
PTH: 13 pg/mL — ABNORMAL LOW (ref 16–77)

## 2023-12-30 ENCOUNTER — Encounter: Payer: Self-pay | Admitting: Physician Assistant

## 2024-01-01 ENCOUNTER — Other Ambulatory Visit: Payer: Self-pay | Admitting: Physician Assistant

## 2024-01-01 ENCOUNTER — Other Ambulatory Visit

## 2024-01-01 NOTE — Addendum Note (Signed)
 Addended by: STACIA LARVE B on: 01/01/2024 10:22 AM   Modules accepted: Orders

## 2024-01-01 NOTE — Telephone Encounter (Signed)
 Checked with [place] in lab, urine not done. Pt coming in today to give urine.

## 2024-01-02 LAB — URINALYSIS
Bilirubin Urine: NEGATIVE
Glucose, UA: NEGATIVE
Leukocytes,Ua: NEGATIVE
Nitrite: NEGATIVE
Protein, ur: NEGATIVE
Specific Gravity, Urine: 1.03 (ref 1.001–1.035)
pH: 6.5 (ref 5.0–8.0)

## 2024-01-02 LAB — HOUSE ACCOUNT TRACKING

## 2024-01-02 NOTE — Progress Notes (Signed)
 Office Visit Note  [name]             Date of Birth: 05/17/1980           MRN: [id number]             PCP: Job Lukes, PA Referring: Job Lukes, PA Visit Date: 01/15/2024   Subjective:   Discussed the use of AI scribe software for clinical note transcription with the patient, who gave verbal consent to proceed.  History of Present Illness   Alicia Carr is a 43 y.o. 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   She presents for follow-up after a hysterectomy and evaluation of ongoing symptoms including joint pain, bruising, and hormonal concerns.  She underwent a hysterectomy on September 24, which has improved her hip and back pain, as she has not experienced sciatica-like pain since the procedure. 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 She wonders if this could explain her hyperpigmentation and hair growth, although her hormone levels have never been abnormal.  She has a history of low calcium and low parathyroid hormone (PTH) levels. Her primary care physician plans to retest these levels, as they were noted to be low before her surgery. She has not had thyroid  surgery, and her magnesium levels were normal in May. She has low iron  and ferritin levels, but a liver biopsy showed no iron  deposition.  She experiences joint and muscle pain, which has improved with Cymbalta . She reports easier bruising and bleeding, with bruises appearing without clear cause and slow healing. She experiences transient red spots on her fingers. Her fingers hurt more in the morning, and she experiences numbness in her hands, particularly when her elbow is bent, suggesting possible nerve impingement.  She has a history of Raynaud's phenomenon and has tried calcium channel blockers and phosphodiesterase inhibitors without significant improvement. She has  not noticed significant swelling except occasionally in her fingers.  She had been on it since December of the previous year. She attempted to discontinue Cymbalta  around her surgery due to concerns about bleeding but experienced severe withdrawal symptoms, including 'brain zaps', and resumed the medication. She is currently on 30 mg once daily.  She reports visual disturbances, described as 'visual snow', which she has had since childhood but feels worse recently. Her insomnia has worsened, and she has experienced weight gain, which she attributes to possible hormonal changes post-surgery and Cymbalta  use.       Previous HPI 10/13/2023 Alicia Carr is a 43 y.o. 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     Her liver enzyme levels, specifically GGT, were elevated at 72 during a recent visit in June. This elevation may be related to her medication, Multrys. She is scheduled for an MRCP in September as part of her routine follow-up for her liver condition, which requires imaging every three years.   She has persistent back pain, primarily located in the lower back near the tailbone, which occasionally flares up severely, affecting her ability to walk straight. The pain does not radiate, but she has experienced sciatica radiating down to her foot, though this has not occurred in the past month. Previous lumbar spine x-rays showed mild degenerative changes.   She started taking Cymbalta  in June. Since then, she has noticed increased bruising and red spots on her skin. The bruises  appear in unusual places without any known trauma and are not painful or itchy. She recalls a significant bruise from a minor bump, which seemed disproportionate to the injury.   Her hands remain stiff, and she experiences pain attributed to nerve issues, possibly related to Raynaud's phenomenon, as her fingers  often appear wrinkled without exposure to water. She has a history of elevated sedimentation rates, which she is curious about, especially in relation to her upcoming hysterectomy for suspected adenomyosis.   No recent viral infections or other illnesses. No significant changes in her health status aside from the symptoms discussed.       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 Also with recent diagnosis confirming iron  deficiency anemia not yet treated with infusion. She presents with worsening circulation issues and joint pain.   She experiences persistent lower back pain radiating down her leg to the top of her foot, exacerbated by activities like walking. Physical therapy has not provided relief. Imaging from a year ago showed mild degenerative changes in her lumbar spine.   She has worsening pain and stiffness in her hands, with morning stiffness but no noticeable swelling. There is constant numbness and decreased sensitivity in her fingertips, which she attributes to a possible blood flow issue. She has not started duloxetine  due to concerns about liver enzyme elevations.   Her history of Raynaud's phenomenon began after a shingles infection three years ago. She experiences numbness in her toes, spider veins, and hyperpigmentation around her ankles. Medications like nifedipine and sildenafil  have been tried with limited success.   She is anemic, with a ferritin level of 10, and is scheduled for an iron  infusion. She has experienced angular cheilitis, which she associates with her iron  deficiency. She has been referred to an endocrinologist due to hormonal concerns, but her hormones have been normal except for iron  deficiency anemia.   She had a cellulitis infection on her shoulder a few months ago, treated with antibiotics, which she believes  started as an insect bite. She has had recurrent infections, including bacterial eye infections, and has been taking more antibiotics recently.   She reports a new symptom of visible green veins and blotchiness on her skin, which she associates with hormonal changes, though her hormone levels are normal. She has a history of a positive ANA titer and elevated sedimentation rate, with a past positive anti-smooth muscle antibody test. 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         She experiences worsening joint pain, particularly in her hands, which has progressed to tingling, stiffness, and numbness, making it difficult to hold objects like her cell phone. Pain is also present in her hip and other joints. She has been referred to physical therapy previously. No sclerodactyly or hardening of the skin is noted. Intermittent swelling in her fingers and joint pain primarily in her pinkies are reported.   She associates some of her symptoms with circulation issues, such as blood pooling and micro hemorrhages in her fingers. She has been evaluated by a cardiologist who suggested a rheumatological cause for these symptoms.   She describes a history of hyperpigmentation, with spider veins and brown discoloration around her ankles. Dermatologists have suggested a hormonal cause, leading to a referral to endocrinology, though she has not yet attended that appointment. She also reports non-painful canker  sores in her mouth.   She has a history of recurrent shingles and is on ongoing Valtrex  treatment. She also experiences frequent eye infections and uses liquid tears for dry eyes. She reports prolonged recovery from illnesses, such as a three-week episode of diarrhea in January.   She has a history of liver issues, but recent liver tests have been normal. She is concerned about starting duloxetine  due to potential liver interactions,  although she has not yet begun the medication.      Previous HPI 02/08/2022 Alicia Carr is a 43 y.o. 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  She has been off any specific treatment since last visit besides supplementing vitamin D  and eyedrops.  Has not seen any resolution of hyperpigmented areas still has new skin rashes popping up in several areas and dry skin.  With cooler weather she is noticing blue discoloration in her fingers more frequently she is not sure if this is the Raynaud's specifically or just general cold intolerance.  She had genetic testing in September as recommended by Dr. Avram this was not positive for GI association did have mutation with increased risk of breast and ovarian cancer.  So she had right ovary as well as bilateral fallopian tubes removed when going for the ovarian cyst surgery.  Does think she might be having more arthralgias since quitting the hydroxychloroquine  but could also be related to cold weather.  Not seeing any obvious swelling or effusions.   10/26/21 Alicia Carr is a 43 y.o. female here for follow up for undifferentiated connective tissue disease symptoms.  Unclear regarding relation of symptoms to underlying diagnosis of early primary sclerosing cholangitis.  She stopped taking the hydroxychloroquine  for concern about a contributing to skin hyperpigmentation has not seen any change regarding this.  She did notice an increase in joint pains particularly around her knuckles and in bilateral knees. This is not debilitating or requiring her to take additional over-the-counter medication to manage.  Raynaud's symptoms have been doing better and she thinks is associated with the recent warmer weather.  Repeat lab follow-up with normal liver function test.  She followed up with Dr. Avram regarding concerns and has been referred to see a geneticist  upcoming appointment on September 6.   08/02/2021 Alicia Carr is a 43 y.o. female here for follow up for raynaud's symptoms, currently taking plaquenil  200 mg by mouth once daily. Since our last visit she feels the joint pain and stiffness improved from before. She also has less raynaud's symptoms than before, although this is not unusual during warmer time of the year. She continues having the small areas of twitching from before and saw Dr. Margaret for this with workup including broad screening antibody testing and imaging of cervical and thoracic spine without major abnormalities. She reports progression of skin hyperpigmentation changes affecting her in multiple areas in patches and also some isolated small hyperpigmented spots on her face and palms and longitudinal changes in her fingernails. She had a nail biopsy consistent with melanotic macule. She saw Rocky Pereyra for this problem who had some concern about possible autoimmune vs genetic or proliferative causes. Symptoms started before exposure to hydroxychloroquine  but also concern for exacerbating symptoms. She has a lot of healthcare related anxiety surrounding this problem, worse after a coworker passed unexpected from metastatic pancreatic cancer recently. She is drinking alcohol more frequently now and has started to gain  weight and feels this is from depressed mood.   05/03/2021 Alicia Carr is a 43 y.o. female here for follow up for raynaud's symptoms after starting sildenafil  20 mg. She continued noticing finger discoloration and rashes. She is also concerned with noticing some hyperpigmentation on the palms of her hands and also darkening of her gums. Still having some twitching and aches and overall concerned she is not feeling well. She had a mild amount of leg swelling intermittently. She is concerned about if her symptoms could be an adrenal or hormonal issue with ongoing skin changes and generalized symptoms.   04/07/21 Alicia  KALIANN Carr is a 43 y.o. female here for follow up with numerous different symptoms ongoing some in exacerbation.  So far addition of the amlodipine  has not significantly helped her finger or toe discoloration is actually having more blue and violaceous changes than before in the fingers.  She feels extremely cold much of the time somewhat regardless of the ambient temperature.  Chronic persistent fatigue remains her most significant issue.  She also continues having muscle twitching or inability to fully close or extend for periods of time the seem to come and go without trigger.  She has some hand swelling no but no increase in leg swelling.  She was feeling some palpitations and wore a heart monitor apparently this showed some episodes of moderate tachycardia but without concerning arrhythmias seen.   10/06/20 Alicia Carr is a 43 y.o. female here with joint pains and numbness affecting bilateral fingers and toes that is worse especially in the past few months.  She developed shingles outbreak on the face and left eye in May this is treated and symptoms have improved besides some residual hyperpigmentation.  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  She reports morning stiffness difficulty gripping her hands tightly or performing tasks first thing in the morning this improves some time and is normal by the afternoon.  She notices occasional swelling or puffiness especially in the feet but not all the time.  She denies discoloration such as pallor, erythema, or cyanosis.  She tried taking oral diclofenac  for joint pains but developed an episode of painless hematochezia so stopped this medication. She denies any lymphadenopathy, photosensitive rashes, or history of blood clots.   Review of Systems  Constitutional:  Positive for fatigue.  HENT:  Positive for mouth dryness. Negative for mouth  sores.   Eyes:  Negative for dryness.  Respiratory:  Negative for shortness of breath.   Cardiovascular:  Negative for chest pain and palpitations.  Gastrointestinal:  Positive for constipation and diarrhea. Negative for blood in stool.  Endocrine: Positive for increased urination.  Genitourinary:  Negative for involuntary urination.  Musculoskeletal:  Positive for joint pain, joint pain, myalgias, morning stiffness and myalgias. Negative for gait problem, joint swelling, muscle weakness and muscle tenderness.  Skin:  Positive for color change and rash. Negative for hair loss and sensitivity to sunlight.  Allergic/Immunologic: Positive for susceptible to infections.  Neurological:  Negative for dizziness and headaches.  Hematological:  Negative for swollen glands.  Psychiatric/Behavioral:  Positive for sleep disturbance. Negative for depressed mood. The patient is not nervous/anxious.     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   Vitamin D  deficiency 05/10/2023   Hepatic steatosis 08/17/2022   Right ovarian cyst 12/03/2021   Monoallelic mutation of RAD51C gene 11/30/2021   Genetic testing 11/29/2021   Family history of prostate cancer 11/04/2021   Family history of ovarian cancer 11/04/2021   Dermoid cyst of right ovary 10/28/2021   Intolerant of cold 08/02/2021   Menorrhagia 08/02/2021   Folliculitis 06/10/2021   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 cause 06/18/2020    Past Medical History:  Diagnosis Date   Alcoholism (HCC)    Anemia    Anxiety    Early hepatic fibrosis    Family history of ovarian cancer    Family history of prostate cancer    Fibroid uterus    Gallstones    Genetic testing 11/29/2021   GERD (gastroesophageal reflux disease)    History of chicken pox    Hyperlipidemia Apr 2023   I know both my Cholesterol and LDL are higher than normal, but I am not sure if this counts as hyperlipidemia or not   Hyperpigmentation    Intramural leiomyoma of uterus 11/22/2023   Longitudinal melanonychia    Menorrhagia with irregular cycle    Nevus    right dorsal wrist   Positive ANA (antinuclear antibody)    Primary sclerosing cholangitis (HCC)    Pruritus    RAD51C gene mutation positive    Raynaud's syndrome    Shingles 07/18/2020   Vaginal delivery 2003    Family History  Problem Relation Age of Onset   Mental illness Mother        paranoid schizophrenia   Hyperlipidemia Mother    Diabetes Mother    Alcohol abuse Father    Colon polyps Father    Hypertension Father    Prostate cancer Father 50   Heart murmur Brother    Hypertension Brother    Cancer Maternal Grandmother        uncertain type   Healthy Daughter    Asthma Daughter    Learning disabilities Daughter    ADD / ADHD Daughter    Anxiety disorder Daughter    Healthy Son    Diabetes Paternal Aunt    Colon cancer Paternal Uncle    Ovarian cancer Paternal Great-grandmother    Ovarian cancer Other        PGF sister   Breast cancer Neg Hx    Endometrial cancer Neg Hx    Pancreatic cancer Neg Hx    Past Surgical History:  Procedure Laterality Date   ABDOMINAL HYSTERECTOMY  11/22/2023   BILATERAL SALPINGECTOMY  12/03/2021   at time of RSO   CESAREAN SECTION  08/08/2006   Birth of 2nd child   CHOLECYSTECTOMY  2003   This happened about 9 months after my son was born   COLONOSCOPY  09/2020   CYSTOSCOPY  10/17/2022    [facility], Microscopic Hematuria, Repeat Micro UA in 1  year.   CYSTOSCOPY N/A 11/22/2023   Procedure: CYSTOSCOPY;  Surgeon: Leonce Garnette BIRCH, MD;  Location: [facility];  Service: Gynecology;  Laterality: N/A;   LIVER BIOPSY  05/2020   ROBOTIC ASSISTED SALPINGO OOPHERECTOMY Bilateral 12/03/2021   Procedure: XI  ROBOTIC ASSISTED BILATERAL SALPINGECTOMY AND RIGHT OOPHORECTOMY;  Surgeon: Leonce Garnette BIRCH, MD;  Location: [facility];  Service: Gynecology;  Laterality: Bilateral;   ROBOTIC ASSISTED TOTAL HYSTERECTOMY WITH BILATERAL SALPINGO OOPHERECTOMY Left 11/22/2023   Procedure: HYSTERECTOMY, TOTAL, ROBOT-ASSISTED, LAPAROSCOPIC, WITH Left OOPHORECTOMY; Pelvic washing;  Surgeon: Leonce Garnette BIRCH, MD;  Location: [facility];  Service: Gynecology;  Laterality: Left;  LEFT OOPHORECTOMY, PELVIC WASHING   Social History   Social History Narrative   Patient is 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, retail for [employer]   1 son born 2003 1 daughter born 2008   Alcohol use regular for 10+ years stopped 01/18/2020 was 2 to 3 glasses of wine daily   No drug use   Coffee and tea 2 or 3 a day for caffeine   Immunization History  Administered Date(s) Administered   Hep A / Hep B 09/08/2020, 10/12/2020   Hepatitis A, Adult 03/12/2021   Hepatitis B, PED/ADOLESCENT 03/12/2021, 03/12/2021   Influenza, Seasonal, Injecte, Preservative Fre 12/10/2022, 12/21/2023   Influenza,inj,Quad PF,6+ Mos 12/26/2018, 11/29/2019, 12/03/2020, 12/14/2021   Influenza,inj,quad, With Preservative 11/29/2019   PFIZER(Purple Top)SARS-COV-2 Vaccination 05/02/2019, 05/23/2019, 01/03/2020, 10/06/2020   Pfizer(Comirnaty)Fall Seasonal Vaccine 12 years and older 12/10/2022   Tdap 05/10/2006, 11/29/2019     Objective: Vital Signs: BP 128/84   Pulse 79   Temp 97.6 F (36.4 C)   Resp 16   Ht 5' 1 (1.549 m)   Wt 169 lb (76.7 kg)   LMP 10/24/2023 (Approximate)   BMI 31.93 kg/m    Physical Exam Eyes:      Conjunctiva/sclera: Conjunctivae normal.  Cardiovascular:     Rate and Rhythm: Normal rate and regular rhythm.  Pulmonary:     Effort: Pulmonary effort is normal.     Breath sounds: Normal breath sounds.  Musculoskeletal:     Right lower leg: No edema.     Left lower leg: No edema.  Lymphadenopathy:     Cervical: No cervical adenopathy.  Skin:    General: Skin is warm and dry.     Comments: Multiple tortuous, enlarged nailfold capillaries, no significant dropout no hemorrhages No nail pitting Right hand and thumb small telangiectasias  Neurological:     Mental Status: She is alert.  Psychiatric:        Mood and Affect: Mood normal.      Musculoskeletal Exam:  Shoulders full ROM no tenderness or swelling Elbows hyperextensible, no swelling or tenderness Wrists full ROM no tenderness or swelling Midline and paraspinal low back tenderness to pressure Hip normal internal and external rotation without pain, no tenderness to lateral hip palpation Knees full ROM no tenderness or swelling Ankles full ROM no tenderness or swelling  Investigation: No additional findings.  Imaging: No results found.   Recent Labs: Lab Results  Component Value Date   WBC 5.4 12/21/2023   HGB 13.1 12/21/2023   PLT 259 12/21/2023   NA 137 12/21/2023   K 4.7 12/21/2023   CL 103 12/21/2023   CO2 21 12/21/2023   GLUCOSE 93 12/21/2023   BUN 19 12/21/2023   CREATININE 0.74 12/21/2023   BILITOT 0.3 12/21/2023   ALKPHOS 80 12/21/2023   AST 17 12/21/2023   ALT 17 12/21/2023   PROT 7.4 12/21/2023   ALBUMIN 4.5 12/21/2023   CALCIUM 9.1 12/21/2023   CALCIUM 8.9 12/21/2023   GFRAA 95 08/24/2016   QFTBGOLDPLUS NEGATIVE 07/23/2020    Speciality Comments: PLQ Eye Exam normal 02/08/2022 [facility] [employer], OD f/u 6  months  Procedures:  No procedures performed Allergies: Patient has no known allergies.   Assessment / Plan:     Visit Diagnoses: Undifferentiated connective tissue disease Raynaud's  phenomenon with transient red spots likely related to Raynaud's. Joint pain improved with Cymbalta , but morning stiffness persists. Previous treatments ineffective. Spironolactone discussed for edema and hair issues, unlikely to affect Raynaud's. - Continue Cymbalta  for joint pain. - Consider spironolactone for edema and hair issues.  Raynaud's syndrome without gangrene Peripheral nerve impingement symptoms (hand numbness) Intermittent hand numbness possibly due to nerve impingement at the elbow. Symptoms exacerbated by certain positions. Previous nerve conduction study post-shingles. - Consider nerve conduction study if symptoms persist.  Low parathyroid hormone and calcium, under evaluation Low PTH noted post-hysterectomy with normal calcium levels. Uncommon condition possibly related to metabolic issues. Vitamin D  levels previously normal.  Easy bruising and slow healing Easy bruising and slow healing possibly related to Cymbalta . Dermatologist noted slow healing of a bruise from four months ago. Fragile capillaries suspected. - Consider tapering Cymbalta  if bruising persists.  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 Primary care physician suggested spironolactone for hair issues. - Consider spironolactone for hair issues as per primary care physician's suggestion.  Insomnia and abnormal weight gain Insomnia and weight gain possibly related to hormonal changes post-hysterectomy or Cymbalta . Cymbalta  may contribute to weight gain. - Consider tapering Cymbalta  if weight gain and insomnia persist.    Orders: No orders of the defined types were placed in this encounter.  No orders of the defined types were placed in this encounter.    Follow-Up Instructions: Return in about 6 months (around 07/14/2024) for UCTD/raynauds obs f/u 6mos.   Lonni LELON Ester, MD  Note - This  record has been created using [employer].  Chart creation errors have been sought, but may not always  have been located. Such creation errors do not reflect on  the standard of medical care.

## 2024-01-03 ENCOUNTER — Ambulatory Visit: Payer: Self-pay | Admitting: Physician Assistant

## 2024-01-03 ENCOUNTER — Other Ambulatory Visit: Payer: Self-pay | Admitting: Physician Assistant

## 2024-01-03 ENCOUNTER — Encounter: Payer: Self-pay | Admitting: Physician Assistant

## 2024-01-03 DIAGNOSIS — Z87448 Personal history of other diseases of urinary system: Secondary | ICD-10-CM

## 2024-01-03 DIAGNOSIS — E215 Disorder of parathyroid gland, unspecified: Secondary | ICD-10-CM

## 2024-01-04 ENCOUNTER — Other Ambulatory Visit (HOSPITAL_COMMUNITY): Payer: Self-pay

## 2024-01-04 ENCOUNTER — Telehealth: Payer: Self-pay

## 2024-01-04 ENCOUNTER — Telehealth: Payer: Self-pay | Admitting: *Deleted

## 2024-01-04 ENCOUNTER — Encounter: Payer: Self-pay | Admitting: Hematology and Oncology

## 2024-01-04 ENCOUNTER — Encounter: Payer: Self-pay | Admitting: Physician Assistant

## 2024-01-04 NOTE — Telephone Encounter (Signed)
 Noted

## 2024-01-04 NOTE — Telephone Encounter (Signed)
 Left detailed message on personal voicemail, calling about [facility] will send My Chart message. Any questions please call office.

## 2024-01-04 NOTE — Telephone Encounter (Signed)
 See message and advise. Wegovy denied.

## 2024-01-04 NOTE — Telephone Encounter (Signed)
 Pharmacy Patient Advocate Encounter   Received notification from Physician's Office that prior authorization for Wegovy 0.25MG /0.5ML Auto-injectors is required/requested.   Insurance verification completed.   The patient is insured through [employer].   Per insurance rep: Total plan exclusion, regardless of diagnosis. PA is not an option, patient pays 100%. If patient has any questions, please have her contact plan.

## 2024-01-04 NOTE — Telephone Encounter (Signed)
 Please do PA for Wegovy 0.25 mg for Fatty Liver. Thanks

## 2024-01-08 ENCOUNTER — Inpatient Hospital Stay: Attending: Psychiatry | Admitting: Psychiatry

## 2024-01-08 ENCOUNTER — Encounter: Payer: Self-pay | Admitting: Psychiatry

## 2024-01-08 DIAGNOSIS — Z90710 Acquired absence of both cervix and uterus: Secondary | ICD-10-CM

## 2024-01-08 DIAGNOSIS — Z9079 Acquired absence of other genital organ(s): Secondary | ICD-10-CM

## 2024-01-08 DIAGNOSIS — Z90722 Acquired absence of ovaries, bilateral: Secondary | ICD-10-CM | POA: Diagnosis not present

## 2024-01-08 DIAGNOSIS — Z1502 Genetic susceptibility to malignant neoplasm of ovary: Secondary | ICD-10-CM | POA: Diagnosis not present

## 2024-01-08 DIAGNOSIS — Z148 Genetic carrier of other disease: Secondary | ICD-10-CM | POA: Diagnosis not present

## 2024-01-08 DIAGNOSIS — Z1589 Genetic susceptibility to other disease: Secondary | ICD-10-CM

## 2024-01-08 NOTE — Progress Notes (Signed)
 Gynecologic Oncology Telehealth Follow-Up Note  I connected with Alicia Carr on 01/08/24 at  4:30 PM EST by telephone and verified that I am speaking with the correct person using two identifiers.  I discussed the limitations, risks, security and privacy concerns of performing an evaluation and management service by telemedicine and the availability of in-person appointments. I also discussed with the patient that there may be a patient responsible charge related to this service. The patient expressed understanding and agreed to proceed.  Other persons participating in the visit and their role in the encounter: none.  Patient's location: Work, [place] Provider's location: 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  Since her last visit she has undergone robotic assisted total laparoscopic hysterectomy and left oophorectomy.  She now no longer has either of her tubes and ovaries.  Patient with questions regarding residual risk of primary peritoneal disease and ongoing surveillance.  Reviewed risk of primary peritoneal disease is estimated to be < 1% to 2%.  Reviewed signs and symptoms of primary peritoneal disease.  No particular screening is recommended at this time.  Do not recommend imaging or tumor markers.  However, do recommend continue routine annual care.  Recommend that if she were to develop any signs or symptoms of primary peritoneal disease that she be evaluated and be empowered to remind them of her mutation.  Patient does not require ongoing surveillance in this clinic.  Patient in agreement with this plan.  Happy to see her back if anything were to change.  I discussed the assessment and treatment plan with the patient.  The patient was provided an opportunity ask questions, and all were answered.  The patient agreed with the plan and  demonstrated understanding of the instructions.  The patient was advised to call back or seek an in-person evaluation of the symptoms worsen or if the condition fails to improve as anticipated.  RTC prn.  Hoy Masters, MD Gynecologic Oncology   Medical Decision Making I personally spent  TOTAL 12 minutes via phone in the care of this patient.    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 This resulted with a RAD51C mutation.   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 And she was previously evaluated by Genetics on 12/01/22.   Interval History: Since her last visit, she underwent a robotic assisted total laparoscopic hysterectomy, left oophorectomy with her OB/GYN Dr. Mace on 11/22/2023.  Pathology was all benign.  Today patient reports that she has been recovering well from surgery but wanted to follow-up to discuss some residual questions regarding her mutation status.  Past Medical/Surgical History: Past Medical History:  Diagnosis Date   Alcoholism (HCC)    Anemia    Anxiety    Early hepatic fibrosis    Family history of ovarian cancer    Family history of prostate cancer    Fibroid uterus    Gallstones    Genetic testing 11/29/2021   GERD (gastroesophageal reflux disease)    History of chicken pox    Hyperlipidemia Apr 2023   I  know both my Cholesterol and LDL are higher than normal, but I am not sure if this counts as hyperlipidemia or not   Hyperpigmentation    Longitudinal melanonychia    Menorrhagia with irregular cycle    Nevus    right dorsal wrist   Positive ANA (antinuclear antibody)    Primary  sclerosing cholangitis (HCC)    Pruritus    RAD51C gene mutation positive    Raynaud's syndrome    Shingles 07/18/2020   Vaginal delivery 2003    Past Surgical History:  Procedure Laterality Date   ABDOMINAL HYSTERECTOMY  11/22/2023   BILATERAL SALPINGECTOMY  12/03/2021   at time of RSO   CESAREAN SECTION  08/08/2006   CHOLECYSTECTOMY  2003   COLONOSCOPY  09/2020   CYSTOSCOPY  10/17/2022   [facility], Microscopic Hematuria, Repeat Micro UA in 1  year.   CYSTOSCOPY N/A 11/22/2023   Procedure: CYSTOSCOPY;  Surgeon: Leonce Garnette BIRCH, MD;  Location: [facility];  Service: Gynecology;  Laterality: N/A;   LIVER BIOPSY  05/2020   ROBOTIC ASSISTED SALPINGO OOPHERECTOMY Bilateral 12/03/2021   Procedure: XI ROBOTIC ASSISTED BILATERAL SALPINGECTOMY AND RIGHT OOPHORECTOMY;  Surgeon: Leonce Garnette BIRCH, MD;  Location: [facility];  Service: Gynecology;  Laterality: Bilateral;   ROBOTIC ASSISTED TOTAL HYSTERECTOMY WITH BILATERAL SALPINGO OOPHERECTOMY Left 11/22/2023   Procedure: HYSTERECTOMY, TOTAL, ROBOT-ASSISTED, LAPAROSCOPIC, WITH Left OOPHORECTOMY; Pelvic washing;  Surgeon: Leonce Garnette BIRCH, MD;  Location: [facility];  Service: Gynecology;  Laterality: Left;  LEFT OOPHORECTOMY, PELVIC WASHING    Family History  Problem Relation Age of Onset   Mental illness Mother        paranoid schizophrenia   Hyperlipidemia Mother    Diabetes Mother    Alcohol abuse Father    Colon polyps Father    Hypertension Father    Prostate cancer Father 67   Heart murmur Brother    Hypertension Brother    Cancer Maternal Grandmother        uncertain type   Healthy Daughter    Asthma Daughter    Learning disabilities Daughter    ADD / ADHD Daughter    Anxiety disorder Daughter    Healthy Son    Diabetes Paternal Aunt    Colon cancer Paternal Uncle    Ovarian cancer Paternal Great-grandmother    Ovarian cancer Other        PGF sister   Breast cancer Neg Hx    Endometrial cancer Neg Hx     Pancreatic cancer Neg Hx     Social History   Socioeconomic History   Marital status: Married    Spouse name: Karleen   Number of children: 2   Years of education: Not on file   Highest education level: Bachelor's degree (e.g., BA, AB, BS)  Occupational History   Occupation: Systems Developer  Tobacco Use   Smoking status: Never    Passive exposure: Past   Smokeless tobacco: Never  Vaping Use   Vaping status: Never Used  Substance and Sexual Activity   Alcohol use: Yes    Alcohol/week: 1.0 standard drink of alcohol    Types: 1 Glasses of wine per week    Comment: Drinking when going out to eat and special occasions only   Drug use: No   Sexual activity: Yes    Birth control/protection: Surgical, None    Comment: I had my fallopian tubes removed due to Ovarian Cancer risk  Other Topics Concern   Not on file  Social History Narrative   Patient is 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, retail for [employer]   1 son born 2003 1 daughter born 2008   Alcohol use regular for 10+ years stopped 01/18/2020 was 2 to 3 glasses of wine daily   No drug use   Coffee and tea 2 or 3 a day for caffeine   Social Drivers of Corporate Investment Banker Strain: Low Risk  (06/20/2023)   Received from [facility]   Overall Financial Resource Strain (CARDIA)    Difficulty of Paying Living Expenses: Not very hard  Recent Concern: Financial Resource Strain - Medium Risk (03/29/2023)   Overall Financial Resource Strain (CARDIA)    Difficulty of Paying Living Expenses: Somewhat hard  Food Insecurity: No Food Insecurity (06/20/2023)   Received from [facility]   Hunger Vital Sign    Within the past 12 months, you worried that your food would run out before you got the money to buy more.: Never true    Within the past 12 months, the food you bought just didn't last and you didn't have money to get more.: Never true  Transportation Needs: No Transportation Needs  (06/20/2023)   Received from [facility] - Transportation    In the past 12 months, has lack of transportation kept you from medical appointments or from getting medications?: No    Lack of Transportation (Non-Medical): No  Physical Activity: Insufficiently Active (03/29/2023)   Exercise Vital Sign    Days of Exercise per Week: 1 day    Minutes of Exercise per Session: 30 min  Stress: No Stress Concern Present (03/29/2023)   [employer] of Occupational Health - Occupational Stress Questionnaire    Feeling of Stress : Only a little  Social Connections: Moderately Integrated (03/29/2023)   Social Connection and Isolation Panel    Frequency of Communication with Friends and Family: Once a week    Frequency of Social Gatherings with Friends and Family: Once a week    Attends Religious Services: More than 4 times per year    Active Member of [employer] or Organizations: Yes    Attends Engineer, Structural: More than 4 times per year    Marital Status: Married    Current Medications:  Current Outpatient Medications:    calcium carbonate (TUMS - DOSED IN MG ELEMENTAL CALCIUM) 500 MG chewable tablet, Chew 1 tablet by mouth as needed for indigestion or heartburn., Disp: , Rfl:    Cholecalciferol (VITAMIN D3) 125 MCG (5000 UT) CAPS, Take 1 capsule by mouth daily in the afternoon., Disp: , Rfl:    cyanocobalamin 100 MCG tablet, Take 100 mcg by mouth daily., Disp: , Rfl:    DULoxetine  (CYMBALTA ) 30 MG capsule, Take 1 capsule (30 mg total) by mouth every morning., Disp: , Rfl:    ferrous sulfate  325 (65 FE) MG tablet, Take 325 mg by mouth daily with breakfast., Disp: , Rfl:    hydroquinone 4 % cream, Apply 1 Application topically daily in the afternoon. (Patient not taking: Reported on 12/21/2023), Disp: , Rfl:    meclizine (ANTIVERT) 25 MG tablet, Take 25 mg by mouth as needed., Disp: , Rfl:    Multiple Vitamins-Minerals (EMERGEN-C VITAMIN C PO), Take 1,000 mg by  mouth daily., Disp: , Rfl:    norethindrone-ethinyl estradiol (LOESTRIN) 1-20 MG-MCG tablet, Take 1 tablet by mouth daily., Disp: , Rfl:    ondansetron  (ZOFRAN -ODT) 4 MG disintegrating  tablet, Take 1 tablet (4 mg total) by mouth every 6 (six) hours as needed for nausea., Disp: 20 tablet, Rfl: 0   semaglutide-weight management (WEGOVY) 0.25 MG/0.5ML SOAJ SQ injection, Inject 0.25 mg into the skin once a week., Disp: 2 mL, Rfl: 1   tretinoin (RETIN-A) 0.025 % cream, Apply 1 Application topically at bedtime., Disp: , Rfl:    valACYclovir  (VALTREX ) 1000 MG tablet, Take 1,000 mg by mouth daily., Disp: , Rfl:   Review of Symptoms: Pertinent positives as per HPI.  Physical Exam: Deferred given limitations of phone visit.  Laboratory & Radiologic Studies: Surgical pathology (11/22/23): 1. Uterus and cervix, and left ovary :      CERVIX:      UNREMARKABLE.      NEGATIVE FOR DYSPLASIA OR MALIGNANCY.      ENDOCERVIX:      NABOTHIAN CYSTS.      NEGATIVE FOR HYPERPLASIA, ATYPIA OR MALIGNANCY.      ENDOMETRIUM:      BENIGN LATE SECRETORY ENDOMETRIUM.      NEGATIVE FOR HYPERPLASIA, ATYPIA OR MALIGNANCY.            MYOMETRIUM:      LEIOMYOMATA.      NEGATIVE FOR MALIGNANCY.      SEROSA:      UNREMARKABLE.      NEGATIVE FOR MALIGNANCY.            LEFT OVARY:      MUTIPLE FOLLICLE CYSTS.      LEYDIG CELL HYPERPLASIA.      NEGATIVE FOR MALIGNANCY.   Cytology (11/22/23) INTERPRETATION(S):      NEGATIVE FOR MALIGNANCY

## 2024-01-09 ENCOUNTER — Encounter: Payer: Self-pay | Admitting: Physician Assistant

## 2024-01-10 ENCOUNTER — Encounter: Payer: Self-pay | Admitting: Physician Assistant

## 2024-01-15 ENCOUNTER — Encounter: Payer: Self-pay | Admitting: Internal Medicine

## 2024-01-15 ENCOUNTER — Telehealth (INDEPENDENT_AMBULATORY_CARE_PROVIDER_SITE_OTHER): Admitting: Diagnostic Neuroimaging

## 2024-01-15 ENCOUNTER — Encounter: Payer: Self-pay | Admitting: Diagnostic Neuroimaging

## 2024-01-15 ENCOUNTER — Ambulatory Visit: Attending: Internal Medicine | Admitting: Internal Medicine

## 2024-01-15 VITALS — BP 128/84 | HR 79 | Temp 97.6°F | Resp 16 | Ht 61.0 in | Wt 169.0 lb

## 2024-01-15 DIAGNOSIS — K8301 Primary sclerosing cholangitis: Secondary | ICD-10-CM

## 2024-01-15 DIAGNOSIS — M359 Systemic involvement of connective tissue, unspecified: Secondary | ICD-10-CM | POA: Diagnosis not present

## 2024-01-15 DIAGNOSIS — D500 Iron deficiency anemia secondary to blood loss (chronic): Secondary | ICD-10-CM | POA: Diagnosis not present

## 2024-01-15 DIAGNOSIS — R200 Anesthesia of skin: Secondary | ICD-10-CM | POA: Diagnosis not present

## 2024-01-15 DIAGNOSIS — R202 Paresthesia of skin: Secondary | ICD-10-CM | POA: Diagnosis not present

## 2024-01-15 DIAGNOSIS — I7300 Raynaud's syndrome without gangrene: Secondary | ICD-10-CM

## 2024-01-15 NOTE — Patient Instructions (Addendum)
 Consider more gradual tapering of cymbalta  like we discussed if interested but limited by side effect. You could ask Dr. Margaret about this or I'd be happy to as well.  I do not see any specific red flags for the raynaud's or active inflammation at this time.  Spironolactone should be safe, no strong evidence for helping raynaud's but also no harm. It can help with inflammation to some degree from alternative inflammatory conditions.

## 2024-01-15 NOTE — Progress Notes (Signed)
 [facility]  [name] DOB: [birth year]  REFERRING CLINICIAN: Job Lukes, PA HISTORY FROM: patient  REASON FOR VISIT: follow up   HISTORICAL  CHIEF COMPLAINT:  Chief Complaint  Patient presents with   Numbness    HISTORY OF PRESENT ILLNESS:   UPDATE (01/15/24, VRP): Since last visit, doing well. HA resolved. Now on duloxetine  and some zap sensations and muscle twitches are better. Sleep is still interrupted. Walking dogs regularly. Used to be a cross country runner in high school and was running 10 years ago, but not lately.   UPDATE (04/25/23, VRP): Since last visit, doing about the same until last year, had worsening left eye symptoms and possible left eye shingles recurrence. Continues with diffuse numbness symptoms. Also with intermittent left eye headache, throbbing, ringing in left ear, left ptosis, 2 per month, lasting hours or whole day.   UPDATE (05/25/21, VRP): Since last visit, doing about the same. Symptoms are continuing. Severity is moderate. Numbness, twitching, vision changes, fingernail changes, color changes. Has positive ANA. Now on plaquenil  per rheumatology.   PRIOR HPI: 43 year old female here for evaluation of numbness tingling, twitching.  Jul 18, 2020 patient had onset of throbbing headache and sensitive to light.  Within a few days she developed rash over her left forehead and left periorbital region.  She was diagnosed with shingles and treated with prednisone  and antivirals.  Over the next few days she developed numbness and tingling in her hands, feet, twitching in the muscles, decreased tone strength, decreased grip strength.  Over the past week her symptoms have worsened.  Patient had been having some issues with memory loss of brain fog even before shingles attack.  She has had some abnormal vision changes.   REVIEW OF SYSTEMS: Full 14 system review of systems performed and negative with exception of: as per  HPI.   ALLERGIES: No Known Allergies  HOME MEDICATIONS: Outpatient Medications Prior to Visit  Medication Sig Dispense Refill   calcium carbonate (TUMS - DOSED IN MG ELEMENTAL CALCIUM) 500 MG chewable tablet Chew 1 tablet by mouth as needed for indigestion or heartburn.     Cholecalciferol (VITAMIN D3) 125 MCG (5000 UT) CAPS Take 1 capsule by mouth daily in the afternoon.     cyanocobalamin 100 MCG tablet Take 100 mcg by mouth daily.     DULoxetine  (CYMBALTA ) 30 MG capsule Take 1 capsule (30 mg total) by mouth every morning.     ferrous sulfate  325 (65 FE) MG tablet Take 325 mg by mouth daily with breakfast.     hydroquinone 4 % cream Apply 1 Application topically daily in the afternoon.     meclizine (ANTIVERT) 25 MG tablet Take 25 mg by mouth as needed.     Multiple Vitamins-Minerals (EMERGEN-C VITAMIN C PO) Take 1,000 mg by mouth daily.     norethindrone-ethinyl estradiol (LOESTRIN) 1-20 MG-MCG tablet Take 1 tablet by mouth daily.     ondansetron  (ZOFRAN -ODT) 4 MG disintegrating tablet Take 1 tablet (4 mg total) by mouth every 6 (six) hours as needed for nausea. 20 tablet 0   tretinoin (RETIN-A) 0.025 % cream Apply 1 Application topically at bedtime.     valACYclovir  (VALTREX ) 1000 MG tablet Take 1,000 mg by mouth daily. (Patient not taking: Reported on 01/15/2024)     semaglutide-weight management (WEGOVY) 0.25 MG/0.5ML SOAJ SQ injection Inject 0.25 mg into the skin once a week. 2 mL 1   No facility-administered medications prior to visit.  PAST MEDICAL HISTORY: Past Medical History:  Diagnosis Date   Alcoholism (HCC)    Anemia    Anxiety    Early hepatic fibrosis    Family history of ovarian cancer    Family history of prostate cancer    Fibroid uterus    Gallstones    Genetic testing 11/29/2021   GERD (gastroesophageal reflux disease)    History of chicken pox    Hyperlipidemia Apr 2023   I know both my Cholesterol and LDL are higher than normal, but I am not sure if  this counts as hyperlipidemia or not   Hyperpigmentation    Intramural leiomyoma of uterus 11/22/2023   Longitudinal melanonychia    Menorrhagia with irregular cycle    Nevus    right dorsal wrist   Positive ANA (antinuclear antibody)    Primary sclerosing cholangitis (HCC)    Pruritus    RAD51C gene mutation positive    Raynaud's syndrome    Shingles 07/18/2020   Vaginal delivery 2003    PAST SURGICAL HISTORY: Past Surgical History:  Procedure Laterality Date   ABDOMINAL HYSTERECTOMY  11/22/2023   BILATERAL SALPINGECTOMY  12/03/2021   at time of RSO   CESAREAN SECTION  08/08/2006   Birth of 2nd child   CHOLECYSTECTOMY  2003   This happened about 9 months after my son was born   COLONOSCOPY  09/2020   CYSTOSCOPY  10/17/2022   [facility], Microscopic Hematuria, Repeat Micro UA in 1  year.   CYSTOSCOPY N/A 11/22/2023   Procedure: CYSTOSCOPY;  Surgeon: Leonce Garnette BIRCH, MD;  Location: [facility];  Service: Gynecology;  Laterality: N/A;   LIVER BIOPSY  05/2020   ROBOTIC ASSISTED SALPINGO OOPHERECTOMY Bilateral 12/03/2021   Procedure: XI ROBOTIC ASSISTED BILATERAL SALPINGECTOMY AND RIGHT OOPHORECTOMY;  Surgeon: Leonce Garnette BIRCH, MD;  Location: [facility];  Service: Gynecology;  Laterality: Bilateral;   ROBOTIC ASSISTED TOTAL HYSTERECTOMY WITH BILATERAL SALPINGO OOPHERECTOMY Left 11/22/2023   Procedure: HYSTERECTOMY, TOTAL, ROBOT-ASSISTED, LAPAROSCOPIC, WITH Left OOPHORECTOMY; Pelvic washing;  Surgeon: Leonce Garnette BIRCH, MD;  Location: [facility];  Service: Gynecology;  Laterality: Left;  LEFT OOPHORECTOMY, PELVIC WASHING    FAMILY HISTORY: Family History  Problem Relation Age of Onset   Mental illness Mother        paranoid schizophrenia   Hyperlipidemia Mother    Diabetes Mother    Alcohol abuse Father    Colon polyps Father    Hypertension Father    Prostate cancer Father 59   Heart murmur Brother    Hypertension Brother    Cancer Maternal Grandmother         uncertain type   Healthy Daughter    Asthma Daughter    Learning disabilities Daughter    ADD / ADHD Daughter    Anxiety disorder Daughter    Healthy Son    Diabetes Paternal Aunt    Colon cancer Paternal Uncle    Ovarian cancer Paternal Great-grandmother    Ovarian cancer Other        PGF sister   Breast cancer Neg Hx    Endometrial cancer Neg Hx    Pancreatic cancer Neg Hx     SOCIAL HISTORY: Social History   Socioeconomic History   Marital status: Married    Spouse name: Karleen   Number of children: 2   Years of education: Not on file   Highest education level: Bachelor's degree (e.g., BA, AB, BS)  Occupational History   Occupation: Systems Developer  Tobacco Use   Smoking status: Never    Passive exposure: Past   Smokeless tobacco: Never  Vaping Use   Vaping status: Never Used  Substance and Sexual Activity   Alcohol use: Yes    Alcohol/week: 1.0 standard drink of alcohol    Types: 1 Glasses of wine per week    Comment: Drinking when going out to eat and special occasions only   Drug use: No   Sexual activity: Yes    Birth control/protection: Surgical, None    Comment: I had my fallopian tubes removed due to Ovarian Cancer risk  Other Topics Concern   Not on file  Social History Narrative   Patient is 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, retail for [employer]   1 son born 2003 1 daughter born 2008   Alcohol use regular for 10+ years stopped 01/18/2020 was 2 to 3 glasses of wine daily   No drug use   Coffee and tea 2 or 3 a day for caffeine   Social Drivers of Corporate Investment Banker Strain: Low Risk  (06/20/2023)   Received from [facility]   Overall Financial Resource Strain (CARDIA)    Difficulty of Paying Living Expenses: Not very hard  Recent Concern: Financial Resource Strain - Medium Risk (03/29/2023)   Overall Financial Resource Strain (CARDIA)    Difficulty of Paying Living Expenses: Somewhat hard  Food Insecurity: No  Food Insecurity (06/20/2023)   Received from [facility]   Hunger Vital Sign    Within the past 12 months, you worried that your food would run out before you got the money to buy more.: Never true    Within the past 12 months, the food you bought just didn't last and you didn't have money to get more.: Never true  Transportation Needs: No Transportation Needs (06/20/2023)   Received from [facility] - Transportation    In the past 12 months, has lack of transportation kept you from medical appointments or from getting medications?: No    Lack of Transportation (Non-Medical): No  Physical Activity: Insufficiently Active (03/29/2023)   Exercise Vital Sign    Days of Exercise per Week: 1 day    Minutes of Exercise per Session: 30 min  Stress: No Stress Concern Present (03/29/2023)   [employer] of Occupational Health - Occupational Stress Questionnaire    Feeling of Stress : Only a little  Social Connections: Moderately Integrated (03/29/2023)   Social Connection and Isolation Panel    Frequency of Communication with Friends and Family: Once a week    Frequency of Social Gatherings with Friends and Family: Once a week    Attends Religious Services: More than 4 times per year    Active Member of [employer] or Organizations: Yes    Attends Engineer, Structural: More than 4 times per year    Marital Status: Married  Catering Manager Violence: Not on file     PHYSICAL EXAM  GENERAL EXAM/CONSTITUTIONAL: Vitals:  There were no vitals filed for this visit.  There is no height or weight on file to calculate BMI. Wt Readings from Last 3 Encounters:  01/15/24 169 lb (76.7 kg)  12/21/23 171 lb 4 oz (77.7 kg)  11/22/23 170 lb (77.1 kg)   Patient is in no distress; well developed, nourished and groomed; neck is supple  CARDIOVASCULAR: Examination of carotid arteries is normal; no carotid bruits Regular rate and rhythm, no  murmurs Examination of peripheral vascular system by observation and palpation is normal  EYES: Ophthalmoscopic exam of optic discs and posterior segments is normal; no papilledema or hemorrhages No results found.  MUSCULOSKELETAL: Gait, strength, tone, movements noted in Neurologic exam below  NEUROLOGIC: MENTAL STATUS:      No data to display         awake, alert, oriented to person, place and time recent and remote memory intact normal attention and concentration language fluent, comprehension intact, naming intact fund of knowledge appropriate  CRANIAL NERVE:  2nd - no papilledema on fundoscopic exam 2nd, 3rd, 4th, 6th - pupils equal and reactive to light, visual fields full to confrontation, extraocular muscles intact, no nystagmus 5th - facial sensation symmetric 7th - facial strength symmetric 8th - hearing intact 9th - palate elevates symmetrically, uvula midline 11th - shoulder shrug symmetric 12th - tongue protrusion midline  MOTOR:  normal bulk and tone, full strength in the BUE, BLE  SENSORY:  normal and symmetric to light touch  COORDINATION:  finger-nose-finger, fine finger movements normal  REFLEXES:  deep tendon reflexes present and symmetric  GAIT/STATION:  narrow based gait     DIAGNOSTIC DATA (LABS, IMAGING, TESTING) - I reviewed patient records, labs, notes, testing and imaging myself where available.  Lab Results  Component Value Date   WBC 5.4 12/21/2023   HGB 13.1 12/21/2023   HCT 39.5 12/21/2023   MCV 84.4 12/21/2023   PLT 259 12/21/2023      Component Value Date/Time   NA 137 12/21/2023 1004   NA 144 08/24/2016 0900   K 4.7 12/21/2023 1004   CL 103 12/21/2023 1004   CO2 21 12/21/2023 1004   GLUCOSE 93 12/21/2023 1004   BUN 19 12/21/2023 1004   BUN 17 08/24/2016 0900   CREATININE 0.74 12/21/2023 1004   CREATININE 0.80 08/15/2023 0840   CALCIUM 9.1 12/21/2023 1004   CALCIUM 8.9 12/21/2023 1004   PROT 7.4 12/21/2023  1004   PROT 6.8 05/25/2021 1612   ALBUMIN 4.5 12/21/2023 1004   ALBUMIN 4.5 12/21/2016 1222   AST 17 12/21/2023 1004   ALT 17 12/21/2023 1004   ALKPHOS 80 12/21/2023 1004   BILITOT 0.3 12/21/2023 1004   BILITOT <0.2 12/21/2016 1222   GFRNONAA >60 11/21/2023 1335   GFRAA 95 08/24/2016 0900   Lab Results  Component Value Date   CHOL 202 (H) 12/21/2023   HDL 65.70 12/21/2023   LDLCALC 106 (H) 12/21/2023   TRIG 149.0 12/21/2023   CHOLHDL 3 12/21/2023   Lab Results  Component Value Date   HGBA1C 5.5 12/21/2023   Lab Results  Component Value Date   VITAMINB12 650 05/25/2021   Lab Results  Component Value Date   TSH 1.480 05/25/2021    09/09/20 Normal MRI brain (with and without).   09/10/20 EMG/NCS - normal  06/15/21 MRI cervical spine - Unremarkable MRI cervical spine with and without contrast.   06/15/21 MRI thoracic spine - Normal MRI thoracic spine with and without contrast.   05/18/23 Normal MRI brain (with and without).    ASSESSMENT AND PLAN  43 y.o. year old female here with numbness, twitching, brain fog, vision changes before and after shingles attack in May 2022.  Dx:  1. Numbness and tingling     PLAN:  NUMBNESS / TWITCHING / BRAIN FOG / VISUAL CHANGES (since ~ 2022 shingles; some intermixed migraine phenomenon / HA; improving) - ibuprofen  as needed; consider triptan - continue duloxetine  for numbness and  anxiety - gradually increase exercise; sleep hygiene reviewed  Return for return to PCP, pending if symptoms worsen or fail to improve.  Virtual Visit via Video Note  I connected with TAMASHA LAPLANTE on 01/15/2024 at  2:15 PM EST by a video enabled telemedicine application and verified that I am speaking with the correct person using two identifiers.   I discussed the limitations of evaluation and management by telemedicine and the availability of in person appointments. The patient expressed understanding and agreed to proceed.  Patient is  at home and I am at the office.   I spent 25 minutes of face-to-face and non-face-to-face time with patient.  This included previsit chart review, lab review, study review, order entry, electronic health record documentation, patient education.      EDUARD FABIENE HANLON, MD 01/15/2024, 2:42 PM Certified in Neurology, Neurophysiology and Neuroimaging  [facility] [address] [place], [place] [address] [phone]

## 2024-01-24 ENCOUNTER — Other Ambulatory Visit (INDEPENDENT_AMBULATORY_CARE_PROVIDER_SITE_OTHER)

## 2024-01-24 DIAGNOSIS — Z87448 Personal history of other diseases of urinary system: Secondary | ICD-10-CM | POA: Diagnosis not present

## 2024-01-24 DIAGNOSIS — E215 Disorder of parathyroid gland, unspecified: Secondary | ICD-10-CM

## 2024-01-24 LAB — URINALYSIS, ROUTINE W REFLEX MICROSCOPIC
Leukocytes,Ua: NEGATIVE
Nitrite: NEGATIVE
Specific Gravity, Urine: 1.02 (ref 1.000–1.030)
Urine Glucose: NEGATIVE
Urobilinogen, UA: 0.2 (ref 0.0–1.0)
pH: 6.5 (ref 5.0–8.0)

## 2024-01-25 LAB — URINE CULTURE
MICRO NUMBER:: 17287241
Result:: NO GROWTH
SPECIMEN QUALITY:: ADEQUATE

## 2024-01-26 LAB — PTH, INTACT AND CALCIUM
Calcium: 9 mg/dL (ref 8.6–10.2)
PTH: 35 pg/mL (ref 16–77)

## 2024-01-28 ENCOUNTER — Encounter: Payer: Self-pay | Admitting: Physician Assistant

## 2024-01-28 ENCOUNTER — Ambulatory Visit: Payer: Self-pay | Admitting: Physician Assistant

## 2024-01-28 DIAGNOSIS — Z87448 Personal history of other diseases of urinary system: Secondary | ICD-10-CM

## 2024-01-29 ENCOUNTER — Telehealth: Payer: Self-pay | Admitting: *Deleted

## 2024-01-29 NOTE — Telephone Encounter (Signed)
 Please see message.

## 2024-01-29 NOTE — Telephone Encounter (Signed)
 Copied from CRM [id number]. Topic: Clinical - Lab/Test Results >> Jan 29, 2024 11:03 AM Alicia Carr wrote: Reason for CRM: Pt called in having questions about her recent urine test results. Pt has questions about bilirubin and protein being in her urine. Pt would like a callback regarding this. Pt has been advised of turnaround time.

## 2024-02-02 ENCOUNTER — Encounter: Payer: Self-pay | Admitting: Physician Assistant

## 2024-02-02 NOTE — Telephone Encounter (Signed)
 Please see pt msg and concerns and advise if appt is preferred to discuss.

## 2024-02-08 ENCOUNTER — Other Ambulatory Visit: Payer: Self-pay | Admitting: *Deleted

## 2024-02-08 DIAGNOSIS — D509 Iron deficiency anemia, unspecified: Secondary | ICD-10-CM

## 2024-02-09 ENCOUNTER — Ambulatory Visit: Admitting: Hematology and Oncology

## 2024-02-09 ENCOUNTER — Ambulatory Visit: Payer: Self-pay | Admitting: Hematology and Oncology

## 2024-02-09 ENCOUNTER — Inpatient Hospital Stay: Attending: Psychiatry

## 2024-02-09 ENCOUNTER — Inpatient Hospital Stay

## 2024-02-09 VITALS — BP 149/89 | HR 103 | Temp 97.9°F | Resp 17 | Wt 162.0 lb

## 2024-02-09 DIAGNOSIS — R233 Spontaneous ecchymoses: Secondary | ICD-10-CM

## 2024-02-09 DIAGNOSIS — Z9189 Other specified personal risk factors, not elsewhere classified: Secondary | ICD-10-CM

## 2024-02-09 DIAGNOSIS — D509 Iron deficiency anemia, unspecified: Secondary | ICD-10-CM | POA: Insufficient documentation

## 2024-02-09 DIAGNOSIS — Z1502 Genetic susceptibility to malignant neoplasm of ovary: Secondary | ICD-10-CM | POA: Insufficient documentation

## 2024-02-09 DIAGNOSIS — Z1501 Genetic susceptibility to malignant neoplasm of breast: Secondary | ICD-10-CM | POA: Insufficient documentation

## 2024-02-09 DIAGNOSIS — R319 Hematuria, unspecified: Secondary | ICD-10-CM | POA: Diagnosis not present

## 2024-02-09 DIAGNOSIS — Z1589 Genetic susceptibility to other disease: Secondary | ICD-10-CM

## 2024-02-09 LAB — CMP (CANCER CENTER ONLY)
ALT: 127 U/L — ABNORMAL HIGH (ref 0–44)
AST: 56 U/L — ABNORMAL HIGH (ref 15–41)
Albumin: 4.6 g/dL (ref 3.5–5.0)
Alkaline Phosphatase: 80 U/L (ref 38–126)
Anion gap: 13 (ref 5–15)
BUN: 16 mg/dL (ref 6–20)
CO2: 22 mmol/L (ref 22–32)
Calcium: 9.5 mg/dL (ref 8.9–10.3)
Chloride: 102 mmol/L (ref 98–111)
Creatinine: 0.82 mg/dL (ref 0.44–1.00)
GFR, Estimated: >60 mL/min (ref >60)
Glucose, Bld: 128 mg/dL — ABNORMAL HIGH (ref 70–99)
Potassium: 3.7 mmol/L (ref 3.5–5.1)
Sodium: 137 mmol/L (ref 135–145)
Total Bilirubin: 0.2 mg/dL (ref 0.0–1.2)
Total Protein: 7.9 g/dL (ref 6.5–8.1)

## 2024-02-09 LAB — TYPE AND SCREEN
ABO/RH(D): O POS
Antibody Screen: NEGATIVE

## 2024-02-09 LAB — IRON AND IRON BINDING CAPACITY (CC-WL,HP ONLY)
Iron: 53 ug/dL (ref 28–170)
Saturation Ratios: 11 % (ref 10.4–31.8)
TIBC: 491 ug/dL — ABNORMAL HIGH (ref 250–450)
UIBC: 439 ug/dL

## 2024-02-09 LAB — CBC WITH DIFFERENTIAL (CANCER CENTER ONLY)
Abs Immature Granulocytes: 0.01 K/uL (ref 0.00–0.07)
Basophils Absolute: 0 K/uL (ref 0.0–0.1)
Basophils Relative: 0 %
Eosinophils Absolute: 0.1 K/uL (ref 0.0–0.5)
Eosinophils Relative: 1 %
HCT: 38.4 % (ref 36.0–46.0)
Hemoglobin: 12.6 g/dL (ref 12.0–15.0)
Immature Granulocytes: 0 %
Lymphocytes Relative: 23 %
Lymphs Abs: 1.5 K/uL (ref 0.7–4.0)
MCH: 27.8 pg (ref 26.0–34.0)
MCHC: 32.8 g/dL (ref 30.0–36.0)
MCV: 84.6 fL (ref 80.0–100.0)
Monocytes Absolute: 0.3 K/uL (ref 0.1–1.0)
Monocytes Relative: 4 %
Neutro Abs: 4.7 K/uL (ref 1.7–7.7)
Neutrophils Relative %: 72 %
Platelet Count: 255 K/uL (ref 150–400)
RBC: 4.54 MIL/uL (ref 3.87–5.11)
RDW: 13.4 % (ref 11.5–15.5)
WBC Count: 6.6 K/uL (ref 4.0–10.5)
nRBC: 0 % (ref 0.0–0.2)

## 2024-02-09 LAB — PLATELET FUNCTION ASSAY: Collagen / Epinephrine: 160 s (ref 0–193)

## 2024-02-09 LAB — APTT: aPTT: 29 s (ref 24–36)

## 2024-02-09 LAB — FERRITIN: Ferritin: 96 ng/mL (ref 11–307)

## 2024-02-09 LAB — PROTIME-INR
INR: 1 (ref 0.8–1.2)
Prothrombin Time: 13.6 s (ref 11.4–15.2)

## 2024-02-09 NOTE — Progress Notes (Signed)
 [name] CONSULT NOTE  Patient Care Team: Job Lukes, [place] as PCP - General (Physician Assistant)  CHIEF COMPLAINTS/PURPOSE OF CONSULTATION:  High risk for [facility]  ASSESSMENT & PLAN:    This is a very pleasant 43 year old female patient with pathogenic RAD50 1C mutation followed in the high risk breast clinic who has been sent to hematology for recommendations regarding iron  deficiency anemia.    Assessment and Plan Assessment & Plan Easy bruising and spontaneous ecchymoses Normal blood counts with no thrombocytopenia. Suspected connective tissue disorder due to normal perioperative hemostasis and normal blood counts. Considered low von Willebrand factor due to blood type O. Condition not life-threatening but requires further evaluation. - Ordered platelet function assay, coagulation studies (PT, PTT), and von Willebrand factor testing. - Instructed her to return for additional blood work. - Advised results will be communicated via MyChart and contact will be made if abnormalities are identified.  Iron  studies pending. - Ordered iron  studies. - Advised results will be communicated via MyChart.  Personal risk for breast cancer Up to date with breast cancer screening. Recent mammogram in October, MRI scheduled for May. No palpable masses or concerning symptoms. Emphasized regular self-examination and provider exams. - Confirmed MRI order for May. - Advised monthly breast self-examination and provider breast exam every six months. - Instructed her to notify provider if changes or abnormalities are detected.    HISTORY OF PRESENTING ILLNESS:  Alicia Carr 43 y.o. female is here because of pathogenic RAD51C mutation and IDA  History of Present Illness    History of Present Illness Alicia Carr is a 43 year old female with iron  deficiency anemia and genetic susceptibility to breast cancer who presents for evaluation of persistent easy bruising.  Since  June 2025, she has experienced persistent easy bruising, primarily on her legs and occasionally on her arms, with lesions appearing as small spots in the morning without antecedent trauma. Some ecchymoses are dark purple and resolve over several days, while others persist longer. In July 2025, she developed a significant hematoma on her leg following minor trauma, which lasted for months. She also notes round spots on her fingers but denies lower extremity edema. She has not initiated any new medications except for melatonin for insomnia, which was discontinued due to additional herbal ingredients. She does not use aspirin, acetaminophen , or antibiotics regularly. She reports increased bleeding after a recent phlebotomy but has previously undergone surgery without hemorrhagic complications.  She reports that her liver doctor stated her liver is not that bad and that she does not have cirrhosis. She reports that her ALT was 'super high' and that there was bilirubin in her urine; her hepatologist recommended retesting after discontinuing the supplement. She undergoes annual urinalysis for hematuria of unclear etiology.  She has consulted rheumatology and dermatology for evaluation of her bruising; no etiology was identified, and varicose veins were excluded. She does not use anticoagulants.  She is up to date with breast cancer screening, with a mammogram completed in October 2025 and MRI scheduled for May 2026. She performs regular self-examinations and denies breast masses or abnormalities.  She is experiencing emotional distress following the recent death of her mother-in-law in a motor vehicle accident, which has affected her and her family.   MEDICAL HISTORY:  Past Medical History:  Diagnosis Date   Alcoholism (HCC)    Anemia    Anxiety    Early hepatic fibrosis    Family history of ovarian cancer    Family history of  prostate cancer    Fibroid uterus    Gallstones    Genetic testing  11/29/2021   GERD (gastroesophageal reflux disease)    History of chicken pox    Hyperlipidemia Apr 2023   I know both my Cholesterol and LDL are higher than normal, but I am not sure if this counts as hyperlipidemia or not   Hyperpigmentation    Intramural leiomyoma of uterus 11/22/2023   Longitudinal melanonychia    Menorrhagia with irregular cycle    Nevus    right dorsal wrist   Positive ANA (antinuclear antibody)    Primary sclerosing cholangitis (HCC)    Pruritus    RAD51C gene mutation positive    Raynaud's syndrome    Shingles 07/18/2020   Vaginal delivery 2003    SURGICAL HISTORY: Past Surgical History:  Procedure Laterality Date   ABDOMINAL HYSTERECTOMY  11/22/2023   BILATERAL SALPINGECTOMY  12/03/2021   at time of RSO   CESAREAN SECTION  08/08/2006   Birth of 2nd child   CHOLECYSTECTOMY  2003   This happened about 9 months after my son was born   COLONOSCOPY  09/2020   CYSTOSCOPY  10/17/2022   [facility], Microscopic Hematuria, Repeat Micro UA in 1  year.   CYSTOSCOPY N/A 11/22/2023   Procedure: CYSTOSCOPY;  Surgeon: Leonce Garnette BIRCH, MD;  Location: [facility] ORS;  Service: Gynecology;  Laterality: N/A;   LIVER BIOPSY  05/2020   ROBOTIC ASSISTED SALPINGO OOPHERECTOMY Bilateral 12/03/2021   Procedure: XI ROBOTIC ASSISTED BILATERAL SALPINGECTOMY AND RIGHT OOPHORECTOMY;  Surgeon: Leonce Garnette BIRCH, MD;  Location: [facility] ORS;  Service: Gynecology;  Laterality: Bilateral;   ROBOTIC ASSISTED TOTAL HYSTERECTOMY WITH BILATERAL SALPINGO OOPHERECTOMY Left 11/22/2023   Procedure: HYSTERECTOMY, TOTAL, ROBOT-ASSISTED, LAPAROSCOPIC, WITH Left OOPHORECTOMY; Pelvic washing;  Surgeon: Leonce Garnette BIRCH, MD;  Location: [facility] ORS;  Service: Gynecology;  Laterality: Left;  LEFT OOPHORECTOMY, PELVIC WASHING    SOCIAL HISTORY: Social History   Socioeconomic History   Marital status: Married    Spouse name: Karleen   Number of children: 2   Years of education: Not on file    Highest education level: Bachelor's degree (e.g., BA, AB, BS)  Occupational History   Occupation: Systems Developer  Tobacco Use   Smoking status: Never    Passive exposure: Past   Smokeless tobacco: Never  Vaping Use   Vaping status: Never Used  Substance and Sexual Activity   Alcohol use: Yes    Alcohol/week: 1.0 standard drink of alcohol    Types: 1 Glasses of wine per week    Comment: Drinking when going out to eat and special occasions only   Drug use: No   Sexual activity: Yes    Birth control/protection: Surgical, None    Comment: I had my fallopian tubes removed due to Ovarian Cancer risk  Other Topics Concern   Not on file  Social History Narrative   Patient is 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, retail for [employer]   1 son born 2003 1 daughter born 2008   Alcohol use regular for 10+ years stopped 01/18/2020 was 2 to 3 glasses of wine daily   No drug use   Coffee and tea 2 or 3 a day for caffeine   Social Drivers of Health   Tobacco Use: Low Risk (01/15/2024)   Patient History    Smoking Tobacco Use: Never    Smokeless Tobacco Use: Never    Passive Exposure: Past  Financial Resource Strain: Low  Risk  (06/20/2023)   Received from [facility]   Overall Financial Resource Strain (CARDIA)    Difficulty of Paying Living Expenses: Not very hard  Recent Concern: Financial Resource Strain - Medium Risk (03/29/2023)   Overall Financial Resource Strain (CARDIA)    Difficulty of Paying Living Expenses: Somewhat hard  Food Insecurity: No Food Insecurity (06/20/2023)   Received from [facility]   Epic    Within the past 12 months, you worried that your food would run out before you got the money to buy more.: Never true    Within the past 12 months, the food you bought just didn't last and you didn't have money to get more.: Never true  Transportation Needs: No Transportation Needs (06/20/2023)   Received from [facility] - Transportation    In the past 12 months, has lack of transportation kept you from medical appointments or from getting medications?: No    Lack of Transportation (Non-Medical): No  Physical Activity: Insufficiently Active (03/29/2023)   Exercise Vital Sign    Days of Exercise per Week: 1 day    Minutes of Exercise per Session: 30 min  Stress: No Stress Concern Present (03/29/2023)   [employer] of Occupational [employer] - Occupational Stress Questionnaire    Feeling of Stress : Only a little  Social Connections: Moderately Integrated (03/29/2023)   Social Connection and Isolation Panel    Frequency of Communication with Friends and Family: Once a week    Frequency of Social Gatherings with Friends and Family: Once a week    Attends Religious Services: More than 4 times per year    Active Member of Clubs or Organizations: Yes    Attends Banker Meetings: More than 4 times per year    Marital Status: Married  Catering Manager Violence: Not on file  Depression (PHQ2-9): Medium Risk (12/21/2023)   Depression (PHQ2-9)    PHQ-2 Score: 6  Alcohol Screen: Low Risk (03/29/2023)   Alcohol Screen    Last Alcohol Screening Score (AUDIT): 1  Housing: Unknown (06/20/2023)   Received [facility]   Epic    In the last 12 months, was there a time when you were not able to pay the mortgage or rent on time?: No    Number of Times Moved in the Last Year: Not on file    At any time in the past 12 months, were you homeless or living in a shelter (including now)?: No  Utilities: Not At Risk (06/20/2023)   Received from [facility] Utilities    Threatened with loss of utilities: No  Health Literacy: Not on file    FAMILY HISTORY: Family History  Problem Relation Age of Onset   Mental illness Mother        paranoid schizophrenia   Hyperlipidemia Mother    Diabetes Mother    Alcohol abuse Father    Colon polyps  Father    Hypertension Father    Prostate cancer Father 78   Heart murmur Brother    Hypertension Brother    Cancer Maternal Grandmother        uncertain type   Healthy Daughter    Asthma Daughter    Learning disabilities Daughter    ADD / ADHD Daughter    Anxiety disorder Daughter    Healthy Son    Diabetes Paternal Aunt    Colon cancer Paternal  Uncle    Ovarian cancer Paternal Great-grandmother    Ovarian cancer Other        PGF sister   Breast cancer Neg Hx    Endometrial cancer Neg Hx    Pancreatic cancer Neg Hx     ALLERGIES:  has no known allergies.  MEDICATIONS:  Current Outpatient Medications  Medication Sig Dispense Refill   calcium carbonate (TUMS - DOSED IN MG ELEMENTAL CALCIUM) 500 MG chewable tablet Chew 1 tablet by mouth as needed for indigestion or heartburn.     Cholecalciferol (VITAMIN D3) 125 MCG (5000 UT) CAPS Take 1 capsule by mouth daily in the afternoon.     cyanocobalamin 100 MCG tablet Take 100 mcg by mouth daily.     DULoxetine  (CYMBALTA ) 30 MG capsule Take 1 capsule (30 mg total) by mouth every morning.     ferrous sulfate  325 (65 FE) MG tablet Take 325 mg by mouth daily with breakfast.     hydroquinone 4 % cream Apply 1 Application topically daily in the afternoon.     meclizine (ANTIVERT) 25 MG tablet Take 25 mg by mouth as needed.     Multiple Vitamins-Minerals (EMERGEN-C VITAMIN C PO) Take 1,000 mg by mouth daily.     norethindrone-ethinyl estradiol (LOESTRIN) 1-20 MG-MCG tablet Take 1 tablet by mouth daily.     ondansetron  (ZOFRAN -ODT) 4 MG disintegrating tablet Take 1 tablet (4 mg total) by mouth every 6 (six) hours as needed for nausea. 20 tablet 0   tretinoin (RETIN-A) 0.025 % cream Apply 1 Application topically at bedtime.     valACYclovir  (VALTREX ) 1000 MG tablet Take 1,000 mg by mouth daily. (Patient not taking: Reported on 01/15/2024)     No current facility-administered medications for this visit.     PHYSICAL EXAMINATION: ECOG  PERFORMANCE STATUS: 0 - Asymptomatic  Vitals:   02/09/24 1335  BP: (!) 149/89  Pulse: (!) 103  Resp: 17  Temp: 97.9 F (36.6 C)  SpO2: 100%   Filed Weights   02/09/24 1335  Weight: 162 lb (73.5 kg)    Physical Exam Constitutional:      Appearance: Normal appearance.  Chest:     Comments: Bilateral breasts inspected.  No palpable masses or regional adenopathy. Musculoskeletal:     Cervical back: Normal range of motion and neck supple. No rigidity.  Lymphadenopathy:     Cervical: No cervical adenopathy.  Neurological:     Mental Status: She is alert.      LABORATORY DATA:  I have reviewed the data as listed Lab Results  Component Value Date   WBC 6.6 02/09/2024   HGB 12.6 02/09/2024   HCT 38.4 02/09/2024   MCV 84.6 02/09/2024   PLT 255 02/09/2024     Chemistry      Component Value Date/Time   NA 137 12/21/2023 1004   NA 144 08/24/2016 0900   K 4.7 12/21/2023 1004   CL 103 12/21/2023 1004   CO2 21 12/21/2023 1004   BUN 19 12/21/2023 1004   BUN 17 08/24/2016 0900   CREATININE 0.74 12/21/2023 1004   CREATININE 0.80 08/15/2023 0840      Component Value Date/Time   CALCIUM 9.0 01/24/2024 0845   ALKPHOS 80 12/21/2023 1004   AST 17 12/21/2023 1004   ALT 17 12/21/2023 1004   BILITOT 0.3 12/21/2023 1004   BILITOT <0.2 12/21/2016 1222       RADIOGRAPHIC STUDIES: I have personally reviewed the radiological images as listed and agreed with the  findings in the report. No results found.  All questions were answered. The patient knows to call the clinic with any problems, questions or concerns. I spent 30 minutes in the care of this patient including H and P, review of records, counseling and coordination of care.     Amber Stalls, MD 02/09/2024 1:56 PM

## 2024-02-10 LAB — VON WILLEBRAND PANEL
Coagulation Factor VIII: 137 % (ref 56–140)
Ristocetin Co-factor, Plasma: 90 % (ref 50–200)
Von Willebrand Antigen, Plasma: 81 % (ref 50–200)

## 2024-02-10 LAB — COAG STUDIES INTERP REPORT

## 2024-02-12 ENCOUNTER — Encounter: Payer: Self-pay | Admitting: Hematology and Oncology

## 2024-02-12 ENCOUNTER — Ambulatory Visit: Payer: Self-pay | Admitting: Hematology and Oncology

## 2024-02-12 NOTE — Telephone Encounter (Signed)
-----   Message from Amber Stalls, MD sent at 02/09/2024  4:40 PM EST ----- No evidence of abnormal coagulation so far, no evidence of IDA, I will keep her posted once Von willebrand panel results.

## 2024-02-21 ENCOUNTER — Other Ambulatory Visit: Payer: Self-pay | Admitting: Diagnostic Neuroimaging

## 2024-02-21 DIAGNOSIS — N921 Excessive and frequent menstruation with irregular cycle: Secondary | ICD-10-CM

## 2024-03-04 ENCOUNTER — Other Ambulatory Visit: Payer: Self-pay | Admitting: Nephrology

## 2024-03-04 DIAGNOSIS — Z87448 Personal history of other diseases of urinary system: Secondary | ICD-10-CM

## 2024-03-06 ENCOUNTER — Encounter: Payer: Self-pay | Admitting: Internal Medicine

## 2024-03-12 ENCOUNTER — Ambulatory Visit
Admission: RE | Admit: 2024-03-12 | Discharge: 2024-03-12 | Disposition: A | Source: Ambulatory Visit | Attending: Nephrology

## 2024-03-12 DIAGNOSIS — Z87448 Personal history of other diseases of urinary system: Secondary | ICD-10-CM

## 2024-03-13 ENCOUNTER — Encounter: Payer: Self-pay | Admitting: Internal Medicine

## 2024-03-13 ENCOUNTER — Encounter: Payer: Self-pay | Admitting: Physician Assistant

## 2024-03-20 ENCOUNTER — Other Ambulatory Visit (HOSPITAL_COMMUNITY): Payer: Self-pay | Admitting: Nephrology

## 2024-03-20 DIAGNOSIS — R3129 Other microscopic hematuria: Secondary | ICD-10-CM

## 2024-03-21 ENCOUNTER — Other Ambulatory Visit: Payer: Self-pay

## 2024-03-21 DIAGNOSIS — N921 Excessive and frequent menstruation with irregular cycle: Secondary | ICD-10-CM

## 2024-03-21 NOTE — Telephone Encounter (Signed)
 DULOXETINE  HCL 30 MG PO CPEP Last refilled by patient on :  01/11/24 Last office visit : 04/25/23 Next office visit : Return in about 6 months (around 10/23/2023) Per office note: trial of duloxetine  for numbness and anxiety   - Please advise on medication refill   patient needs to make another appointment for further care. I called and was unable to leave a message.

## 2024-04-04 ENCOUNTER — Other Ambulatory Visit: Payer: Self-pay | Admitting: Radiology

## 2024-04-04 DIAGNOSIS — R3129 Other microscopic hematuria: Secondary | ICD-10-CM

## 2024-04-04 NOTE — Progress Notes (Shared)
 "   Chief Complaint: Hematuria, microscopic  Referring Provider(s): [name]  Supervising Physician: [name]  Patient Status: [facility] - Out-pt  History of Present Illness: Alicia Carr is a 44 y.o. female with PMH significant for early primary sclerosing cholangitis, positive ANA, uterine fibroids status post hysterectomy, Raynaud's syndrome, RAD 51C gene mutation.  She is followed by urology due to microscopic hematuria.  Patient reports that previous cystoscopy with prior urologist was normal.  Numerous other lab studies and imaging have been performed without obvious explanation for hematuria.  She is also followed by hematology for increased bruising without remarkable lab studies.  Due to the persistence of her hematuria without definite diagnosis, a renal biopsy has been requested.  Patient is familiar with general biopsy process from previous hepatic biopsy with Dr. Alona 06/10/2020, Versed  1.0 mg and Fentanyl  25 mcg well-tolerated for that procedure.  Confirms NPO since MN *** and ride/supervision available for 24 hours.  Does not wear CPAP or use supplemental home O2 ***  Denies fever, chills, SOB, CP, sore throat, N/V, abd pain, blood in stool or urine, abnormal bruising, leg swelling, back pain.   Allergies Reviewed:  Patient has no known allergies.   Patient is Full Code  Past Medical History:  Diagnosis Date   Alcoholism (HCC)    Anemia    Anxiety    Early hepatic fibrosis    Family history of ovarian cancer    Family history of prostate cancer    Fibroid uterus    Gallstones    Genetic testing 11/29/2021   GERD (gastroesophageal reflux disease)    History of chicken pox    Hyperlipidemia Apr 2023   I know both my Cholesterol and LDL are higher than normal, but I am not sure if this counts as hyperlipidemia or not   Hyperpigmentation    Intramural leiomyoma of uterus 11/22/2023   Longitudinal melanonychia    Menorrhagia with irregular  cycle    Nevus    right dorsal wrist   Positive ANA (antinuclear antibody)    Primary sclerosing cholangitis (HCC)    Pruritus    RAD51C gene mutation positive    Raynaud's syndrome    Shingles 07/18/2020   Vaginal delivery 2003    Past Surgical History:  Procedure Laterality Date   ABDOMINAL HYSTERECTOMY  11/22/2023   BILATERAL SALPINGECTOMY  12/03/2021   at time of RSO   CESAREAN SECTION  08/08/2006   Birth of 2nd child   CHOLECYSTECTOMY  2003   This happened about 9 months after my son was born   COLONOSCOPY  09/2020   CYSTOSCOPY  10/17/2022   [facility], Microscopic Hematuria, Repeat Micro UA in 1  year.   CYSTOSCOPY N/A 11/22/2023   Procedure: CYSTOSCOPY;  Surgeon: Mace Garnette BIRCH, MD;  Location: [facility] ORS;  Service: Gynecology;  Laterality: N/A;   LIVER BIOPSY  05/2020   ROBOTIC ASSISTED SALPINGO OOPHERECTOMY Bilateral 12/03/2021   Procedure: XI ROBOTIC ASSISTED BILATERAL SALPINGECTOMY AND RIGHT OOPHORECTOMY;  Surgeon: Mace Garnette BIRCH, MD;  Location: [facility] ORS;  Service: Gynecology;  Laterality: Bilateral;   ROBOTIC ASSISTED TOTAL HYSTERECTOMY WITH BILATERAL SALPINGO OOPHERECTOMY Left 11/22/2023   Procedure: HYSTERECTOMY, TOTAL, ROBOT-ASSISTED, LAPAROSCOPIC, WITH Left OOPHORECTOMY; Pelvic washing;  Surgeon: Mace Garnette BIRCH, MD;  Location: [facility] ORS;  Service: Gynecology;  Laterality: Left;  LEFT OOPHORECTOMY, PELVIC WASHING      Medications: Prior to Admission medications  Medication Sig Start Date End Date Taking? Authorizing Provider  calcium carbonate (TUMS -  DOSED IN MG ELEMENTAL CALCIUM) 500 MG chewable tablet Chew 1 tablet by mouth as needed for indigestion or heartburn.    [provider]  Cholecalciferol (VITAMIN D3) 125 MCG (5000 UT) CAPS Take 1 capsule by mouth daily in the afternoon.    [provider]  cyanocobalamin 100 MCG tablet Take 100 mcg by mouth daily.    [provider]  DULoxetine  (CYMBALTA ) 30 MG capsule Take  1 capsule (30 mg total) by mouth every morning. 11/22/23   Leonce Garnette BIRCH, MD  ferrous sulfate  325 (65 FE) MG tablet Take 325 mg by mouth daily with breakfast.    [provider]  hydroquinone 4 % cream Apply 1 Application topically daily in the afternoon. 12/13/23   [provider]  meclizine (ANTIVERT) 25 MG tablet Take 25 mg by mouth as needed. 08/02/22   [provider]  Multiple Vitamins-Minerals (EMERGEN-C VITAMIN C PO) Take 1,000 mg by mouth daily.    [provider]  norethindrone-ethinyl estradiol (LOESTRIN) 1-20 MG-MCG tablet Take 1 tablet by mouth daily.    [provider]  ondansetron  (ZOFRAN -ODT) 4 MG disintegrating tablet Take 1 tablet (4 mg total) by mouth every 6 (six) hours as needed for nausea. 11/22/23   Leonce Garnette BIRCH, MD  tretinoin (RETIN-A) 0.025 % cream Apply 1 Application topically at bedtime. 12/13/23   [provider]  valACYclovir  (VALTREX ) 1000 MG tablet Take 1,000 mg by mouth daily. Patient not taking: Reported on 01/15/2024    [provider]     Family History  Problem Relation Age of Onset   Mental illness Mother        paranoid schizophrenia   Hyperlipidemia Mother    Diabetes Mother    Alcohol abuse Father    Colon polyps Father    Hypertension Father    Prostate cancer Father 45   Heart murmur Brother    Hypertension Brother    Cancer Maternal Grandmother        uncertain type   Healthy Daughter    Asthma Daughter    Learning disabilities Daughter    ADD / ADHD Daughter    Anxiety disorder Daughter    Healthy Son    Diabetes Paternal Aunt    Colon cancer Paternal Uncle    Ovarian cancer Paternal Great-grandmother    Ovarian cancer Other        PGF sister   Breast cancer Neg Hx    Endometrial cancer Neg Hx    Pancreatic cancer Neg Hx     Social History   Socioeconomic History   Marital status: Married    Spouse name: Karleen   Number of children: 2   Years of education:  Not on file   Highest education level: Bachelor's degree (e.g., BA, AB, BS)  Occupational History   Occupation: Systems Developer  Tobacco Use   Smoking status: Never    Passive exposure: Past   Smokeless tobacco: Never  Vaping Use   Vaping status: Never Used  Substance and Sexual Activity   Alcohol use: Yes    Alcohol/week: 1.0 standard drink of alcohol    Types: 1 Glasses of wine per week    Comment: Drinking when going out to eat and special occasions only   Drug use: No   Sexual activity: Yes    Birth control/protection: Surgical, None    Comment: I had my fallopian tubes removed due to Ovarian Cancer risk  Other Topics Concern   Not on  file  Social History Narrative   Patient is 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, retail for [employer]   1 son born 2003 1 daughter born 2008   Alcohol use regular for 10+ years stopped 01/18/2020 was 2 to 3 glasses of wine daily   No drug use   Coffee and tea 2 or 3 a day for caffeine   Social Drivers of Health   Tobacco Use: Low Risk (01/15/2024)   Patient History    Smoking Tobacco Use: Never    Smokeless Tobacco Use: Never    Passive Exposure: Past  Financial Resource Strain: Low Risk  (06/20/2023)   Received from [facility]   Overall Financial Resource Strain (CARDIA)    Difficulty of Paying Living Expenses: Not very hard  Recent Concern: Financial Resource Strain - Medium Risk (03/29/2023)   Overall Financial Resource Strain (CARDIA)    Difficulty of Paying Living Expenses: Somewhat hard  Food Insecurity: No Food Insecurity (06/20/2023)   Received from [facility]   Epic    Within the past 12 months, you worried that your food would run out before you got the money to buy more.: Never true    Within the past 12 months, the food you bought just didn't last and you didn't have money to get more.: Never true  Transportation Needs: No Transportation Needs (06/20/2023)   Received from [facility]   PRAPARE - Transportation    In the past 12 months, has lack of transportation kept you from medical appointments or from getting medications?: No    Lack of Transportation (Non-Medical): No  Physical Activity: Insufficiently Active (03/29/2023)   Exercise Vital Sign    Days of Exercise per Week: 1 day    Minutes of Exercise per Session: 30 min  Stress: No Stress Concern Present (03/29/2023)   [employer] of Occupational Health - Occupational Stress Questionnaire    Feeling of Stress : Only a little  Social Connections: Moderately Integrated (03/29/2023)   Social Connection and Isolation Panel    Frequency of Communication with Friends and Family: Once a week    Frequency of Social Gatherings with Friends and Family: Once a week    Attends Religious Services: More than 4 times per year    Active Member of Clubs or Organizations: Yes    Attends Banker Meetings: More than 4 times per year    Marital Status: Married  Depression (PHQ2-9): Medium Risk (12/21/2023)   Depression (PHQ2-9)    PHQ-2 Score: 6  Alcohol Screen: Low Risk (03/29/2023)   Alcohol Screen    Last Alcohol Screening Score (AUDIT): 1  Housing: Unknown (06/20/2023)   Received from [facility]   Epic    In the last 12 months, was there a time when you were not able to pay the mortgage or rent on time?: No    Number of Times Moved in the Last Year: Not on file    At any time in the past 12 months, were you homeless or living in a shelter (including now)?: No  Utilities: Not At Risk (06/20/2023)   Received from [facility]    Threatened with loss of utilities: No  Health Literacy: Not on file     Review of Systems: A 12 point ROS discussed and pertinent positives are indicated in the HPI above.  All other systems are negative.  Review of Systems  Vital Signs: LMP 10/24/2023     Physical Exam  Imaging: US  RENAL Result  Date: 03/13/2024 CLINICAL DATA:  Initial evaluation for hematuria. EXAM: RENAL / URINARY TRACT ULTRASOUND COMPLETE COMPARISON:  Prior ultrasound from 08/23/2022. FINDINGS: Right Kidney: Renal measurements: 9.8 x 3.7 x 4.2 cm = volume: 79.0 mL. Increased echogenicity within the renal parenchyma. No nephrolithiasis or hydronephrosis. No focal renal mass. Left Kidney: Renal measurements: 10.2 x 5.0 x 4.8 cm = volume: 127.0 mL. Increased echogenicity within the renal parenchyma. No nephrolithiasis or hydronephrosis. No focal renal mass. Bladder: Appears normal for degree of bladder distention. Both ureteral jets are seen. Other: None. IMPRESSION: 1. Increased echogenicity within the renal parenchyma, suggestive of medical renal disease. 2. No sonographic evidence for nephrolithiasis or obstructive uropathy. Electronically Signed   By: Morene Hoard M.D.   On: 03/13/2024 06:14    Labs:  CBC: Recent Labs    10/13/23 1215 11/21/23 1335 12/21/23 1017 02/09/24 1318  WBC 5.8 5.3 5.4 6.6  HGB 11.8 11.3* 13.1 12.6  HCT 37.6 36.0 39.5 38.4  PLT 287 262 259 255    COAGS: Recent Labs    10/13/23 1215 02/09/24 1410  INR 0.9 1.0  APTT  --  29    BMP: Recent Labs    08/15/23 0840 11/21/23 1335 12/21/23 1004 01/24/24 0845 02/09/24 1318  NA 136 138 137  --  137  K 5.0 3.7 4.7  --  3.7  CL 101 106 103  --  102  CO2 26 24 21   --  22  GLUCOSE 88 82 93  --  128*  BUN 16 17 19   --  16  CALCIUM 9.3 8.5* 9.1  8.9 9.0 9.5  CREATININE 0.80 0.65 0.74  --  0.82  GFRNONAA  --  >60  --   --  >60    LIVER FUNCTION TESTS: Recent Labs    04/06/23 0948 08/15/23 0840 11/21/23 1335 12/21/23 1004 02/09/24 1318  BILITOT 0.3 0.2 0.2 0.3 0.2  AST 26 32* 23 17 56*  ALT 22 39* 21 17 127*  ALKPHOS 86  --  58 80 80  PROT 7.1 7.1 7.2 7.4 7.9  ALBUMIN 4.3  --  3.7 4.5 4.6    TUMOR MARKERS: No results for input(s): AFPTM, CEA, CA199, CHROMGRNA in the last 8760 hours.  Assessment and  Plan:  Request for image guided random renal biopsy approved and scheduled for 04/05/2024 with Dr. Karalee. No contraindications for procedure identified in ROS, physical exam, or review of pre-sedation considerations. *** Labs reviewed and within acceptable range 03/12/2024 renal ultrasound imaging available and reviewed.  No nephrolithiasis or hydronephrosis.  No focal renal mass. *** VSS, afebrile.  Blood pressure within acceptable range (<160/90 mmHg).  Patient does not take a blood thinner Abx not indicated    Risks and benefits of image guided random renal biopsy was discussed with the patient and/or patient's family including, but not limited to bleeding, infection, damage to adjacent structures or low yield requiring additional tests.  All of the questions were answered and there is agreement to proceed.  Consent signed and in chart.   Thank you for allowing our service to participate in Alicia Carr 's care.    Electronically Signed: Laymon Coast, NP   04/04/2024, 9:50 AM     I spent a total of  15 Minutes in face to face in clinical consultation, greater than 50% of which was counseling/coordinating care for image guided random renal biopsy.   (  A copy of this note was sent to the referring provider and the time of visit.)  "

## 2024-04-05 ENCOUNTER — Other Ambulatory Visit (HOSPITAL_COMMUNITY): Payer: Self-pay | Admitting: Nephrology

## 2024-04-05 ENCOUNTER — Encounter (HOSPITAL_COMMUNITY): Payer: Self-pay

## 2024-04-05 ENCOUNTER — Other Ambulatory Visit: Payer: Self-pay

## 2024-04-05 ENCOUNTER — Ambulatory Visit (HOSPITAL_COMMUNITY): Admission: RE | Admit: 2024-04-05 | Source: Ambulatory Visit

## 2024-04-05 DIAGNOSIS — R3129 Other microscopic hematuria: Secondary | ICD-10-CM

## 2024-04-05 LAB — CBC
HCT: 39.6 % (ref 36.0–46.0)
Hemoglobin: 12.7 g/dL (ref 12.0–15.0)
MCH: 28.1 pg (ref 26.0–34.0)
MCHC: 32.1 g/dL (ref 30.0–36.0)
MCV: 87.6 fL (ref 80.0–100.0)
Platelets: 253 10*3/uL (ref 150–400)
RBC: 4.52 MIL/uL (ref 3.87–5.11)
RDW: 13.6 % (ref 11.5–15.5)
WBC: 5.2 10*3/uL (ref 4.0–10.5)
nRBC: 0 % (ref 0.0–0.2)

## 2024-04-05 LAB — PROTIME-INR
INR: 1 (ref 0.8–1.2)
Prothrombin Time: 13.6 s (ref 11.4–15.2)

## 2024-04-05 MED ORDER — SODIUM CHLORIDE 0.9 % IV SOLN: INTRAVENOUS | Status: AC

## 2024-04-05 MED ORDER — HYDRALAZINE HCL 20 MG/ML IJ SOLN
INTRAMUSCULAR | Status: AC
  Filled 2024-04-05: qty 1

## 2024-04-05 MED ORDER — MIDAZOLAM HCL (PF) 2 MG/2ML IJ SOLN
INTRAMUSCULAR | Status: AC | PRN
  Administered 2024-04-05: .5 mg via INTRAVENOUS
  Administered 2024-04-05: 1 mg via INTRAVENOUS

## 2024-04-05 MED ORDER — FENTANYL CITRATE (PF) 100 MCG/2ML IJ SOLN
INTRAMUSCULAR | Status: AC | PRN
  Administered 2024-04-05: 50 ug via INTRAVENOUS
  Administered 2024-04-05: 25 ug via INTRAVENOUS

## 2024-04-05 MED ORDER — HYDRALAZINE HCL 20 MG/ML IJ SOLN
INTRAMUSCULAR | Status: AC | PRN
  Administered 2024-04-05: 10 mg via INTRAVENOUS

## 2024-04-05 MED ORDER — FENTANYL CITRATE (PF) 100 MCG/2ML IJ SOLN
INTRAMUSCULAR | Status: AC
  Filled 2024-04-05: qty 2

## 2024-04-05 MED ORDER — MIDAZOLAM HCL 2 MG/2ML IJ SOLN
INTRAMUSCULAR | Status: AC
  Filled 2024-04-05: qty 2

## 2024-04-05 NOTE — H&P (Signed)
 "     Chief Complaint: Patient was seen in consultation today for hematuria- random renal biopsy at the request of [name]  Referring Physician(s): [name]  Supervising Physician: [name]  Patient Status: [facility] - Out-pt  History of Present Illness: Alicia Carr is a 44 y.o. female   FULL Code status per pt  Known micro hematuria x 1 yr Follows with Dr Dolan Most recent visit micro hematuria was increased Denies pain or visible hematuria  US  Renal 03/12/24:  IMPRESSION: 1. Increased echogenicity within the renal parenchyma, suggestive of medical renal disease. 2. No sonographic evidence for nephrolithiasis or obstructive uropathy.  Scheduled for random renal biopsy today  Past Medical History:  Diagnosis Date   Alcoholism (HCC)    Anemia    Anxiety    Early hepatic fibrosis    Family history of ovarian cancer    Family history of prostate cancer    Fibroid uterus    Gallstones    Genetic testing 11/29/2021   GERD (gastroesophageal reflux disease)    History of chicken pox    Hyperlipidemia Apr 2023   I know both my Cholesterol and LDL are higher than normal, but I am not sure if this counts as hyperlipidemia or not   Hyperpigmentation    Intramural leiomyoma of uterus 11/22/2023   Longitudinal melanonychia    Menorrhagia with irregular cycle    Nevus    right dorsal wrist   Positive ANA (antinuclear antibody)    Primary sclerosing cholangitis (HCC)    Pruritus    RAD51C gene mutation positive    Raynaud's syndrome    Shingles 07/18/2020   Vaginal delivery 2003    Past Surgical History:  Procedure Laterality Date   ABDOMINAL HYSTERECTOMY  11/22/2023   BILATERAL SALPINGECTOMY  12/03/2021   at time of RSO   CESAREAN SECTION  08/08/2006   Birth of 2nd child   CHOLECYSTECTOMY  2003   This happened about 9 months after my son was born   COLONOSCOPY  09/2020   CYSTOSCOPY  10/17/2022   [facility], Microscopic  Hematuria, Repeat Micro UA in 1  year.   CYSTOSCOPY N/A 11/22/2023   Procedure: CYSTOSCOPY;  Surgeon: Mace Garnette BIRCH, MD;  Location: [facility] ORS;  Service: Gynecology;  Laterality: N/A;   LIVER BIOPSY  05/2020   ROBOTIC ASSISTED SALPINGO OOPHERECTOMY Bilateral 12/03/2021   Procedure: XI ROBOTIC ASSISTED BILATERAL SALPINGECTOMY AND RIGHT OOPHORECTOMY;  Surgeon: Mace Garnette BIRCH, MD;  Location: [facility] ORS;  Service: Gynecology;  Laterality: Bilateral;   ROBOTIC ASSISTED TOTAL HYSTERECTOMY WITH BILATERAL SALPINGO OOPHERECTOMY Left 11/22/2023   Procedure: HYSTERECTOMY, TOTAL, ROBOT-ASSISTED, LAPAROSCOPIC, WITH Left OOPHORECTOMY; Pelvic washing;  Surgeon: Mace Garnette BIRCH, MD;  Location: [facility] ORS;  Service: Gynecology;  Laterality: Left;  LEFT OOPHORECTOMY, PELVIC WASHING    Allergies: Patient has no known allergies.  Medications: Prior to Admission medications  Medication Sig Start Date End Date Taking? Authorizing Provider  calcium carbonate (TUMS - DOSED IN MG ELEMENTAL CALCIUM) 500 MG chewable tablet Chew 1 tablet by mouth as needed for indigestion or heartburn.   Yes [provider]  Cholecalciferol (VITAMIN D3) 125 MCG (5000 UT) CAPS Take 1 capsule by mouth daily in the afternoon.   Yes [provider]  cyanocobalamin 100 MCG tablet Take 100 mcg by mouth daily.   Yes [provider]  DULoxetine  (CYMBALTA ) 30 MG capsule Take 1 capsule (30 mg total) by mouth every morning. 11/22/23  Yes Mace Garnette BIRCH, MD  ferrous sulfate  325 (65 FE) MG tablet Take 325 mg by mouth daily with breakfast.   Yes [provider]  norethindrone-ethinyl estradiol (LOESTRIN) 1-20 MG-MCG tablet Take 1 tablet by mouth daily.   Yes [provider]  ondansetron  (ZOFRAN -ODT) 4 MG disintegrating tablet Take 1 tablet (4 mg total) by mouth every 6 (six) hours as needed for nausea. 11/22/23  Yes Leonce Garnette BIRCH, MD  tretinoin (RETIN-A) 0.025 % cream Apply 1 Application  topically at bedtime. 12/13/23  Yes [provider]  hydroquinone 4 % cream Apply 1 Application topically daily in the afternoon. 12/13/23   [provider]  meclizine (ANTIVERT) 25 MG tablet Take 25 mg by mouth as needed. 08/02/22   [provider]  Multiple Vitamins-Minerals (EMERGEN-C VITAMIN C PO) Take 1,000 mg by mouth daily.    [provider]  valACYclovir  (VALTREX ) 1000 MG tablet Take 1,000 mg by mouth daily. Patient not taking: No sig reported    [provider]     Family History  Problem Relation Age of Onset   Mental illness Mother        paranoid schizophrenia   Hyperlipidemia Mother    Diabetes Mother    Alcohol abuse Father    Colon polyps Father    Hypertension Father    Prostate cancer Father 5   Heart murmur Brother    Hypertension Brother    Cancer Maternal Grandmother        uncertain type   Healthy Daughter    Asthma Daughter    Learning disabilities Daughter    ADD / ADHD Daughter    Anxiety disorder Daughter    Healthy Son    Diabetes Paternal Aunt    Colon cancer Paternal Uncle    Ovarian cancer Paternal Great-grandmother    Ovarian cancer Other        PGF sister   Breast cancer Neg Hx    Endometrial cancer Neg Hx    Pancreatic cancer Neg Hx     Social History   Socioeconomic History   Marital status: Married    Spouse name: Karleen   Number of children: 2   Years of education: Not on file   Highest education level: Bachelor's degree (e.g., BA, AB, BS)  Occupational History   Occupation: Systems Developer  Tobacco Use   Smoking status: Never    Passive exposure: Past   Smokeless tobacco: Never  Vaping Use   Vaping status: Never Used  Substance and Sexual Activity   Alcohol use: Yes    Alcohol/week: 1.0 standard drink of alcohol    Types: 1 Glasses of wine per week    Comment: Drinking when going out to eat and special occasions only   Drug use: No   Sexual activity: Yes    Birth  control/protection: Surgical, None    Comment: I had my fallopian tubes removed due to Ovarian Cancer risk  Other Topics Concern   Not on file  Social History Narrative   Patient is 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, retail for [employer]   1 son born 2003 1 daughter born 2008   Alcohol use regular for 10+ years stopped 01/18/2020 was 2 to 3 glasses of wine daily   No drug use   Coffee and tea 2 or 3 a day for caffeine   Social Drivers of Health   Tobacco Use: Low Risk (04/05/2024)   Patient History    Smoking Tobacco Use: Never  Smokeless Tobacco Use: Never    Passive Exposure: Past  Financial Resource Strain: Low Risk  (06/20/2023)   Received [facility]   Overall Financial Resource Strain (CARDIA)    Difficulty of Paying Living Expenses: Not very hard  Recent Concern: Financial Resource Strain - Medium Risk (03/29/2023)   Overall Financial Resource Strain (CARDIA)    Difficulty of Paying Living Expenses: Somewhat hard  Food Insecurity: No Food Insecurity (06/20/2023)   Received from [facility]   Epic    Within the past 12 months, you worried that your food would run out before you got the money to buy more.: Never true    Within the past 12 months, the food you bought just didn't last and you didn't have money to get more.: Never true  Transportation Needs: No Transportation Needs (06/20/2023)   Received from [facility] - Transportation    In the past 12 months, has lack of transportation kept you from medical appointments or from getting medications?: No    Lack of Transportation (Non-Medical): No  Physical Activity: Insufficiently Active (03/29/2023)   Exercise Vital Sign    Days of Exercise per Week: 1 day    Minutes of Exercise per Session: 30 min  Stress: No Stress Concern Present (03/29/2023)   [employer] of [employer]ational [employer] - Occupational Stress Questionnaire    Feeling of Stress : Only a  little  Social Connections: Moderately Integrated (03/29/2023)   Social Connection and Isolation Panel    Frequency of Communication with Friends and Family: Once a week    Frequency of Social Gatherings with Friends and Family: Once a week    Attends Religious Services: More than 4 times per year    Active Member of Clubs or Organizations: Yes    Attends Banker Meetings: More than 4 times per year    Marital Status: Married  Depression (PHQ2-9): Medium Risk (12/21/2023)   Depression (PHQ2-9)    PHQ-2 Score: 6  Alcohol Screen: Low Risk (03/29/2023)   Alcohol Screen    Last Alcohol Screening Score (AUDIT): 1  Housing: Unknown (06/20/2023)   Received from [facility]   Epic    In the last 12 months, was there a time when you were not able to pay the mortgage or rent on time?: No    Number of Times Moved in the Last Year: Not on file    At any time in the past 12 months, were you homeless or living in a shelter (including now)?: No  Utilities: Not At Risk (06/20/2023)   Received from [facility] Utilities    Threatened with loss of utilities: No  Health Literacy: Not on file    Review of Systems: A 12 point ROS discussed and pertinent positives are indicated in the HPI above.  All other systems are negative.  Review of Systems  Constitutional:  Negative for activity change, fatigue and fever.  Respiratory:  Negative for cough and shortness of breath.   Cardiovascular:  Negative for chest pain.  Gastrointestinal:  Negative for abdominal pain, nausea and vomiting.  Genitourinary:  Positive for hematuria. Negative for difficulty urinating and dysuria.  Musculoskeletal:  Negative for back pain.  Neurological:  Negative for weakness.  Psychiatric/Behavioral:  Negative for behavioral problems and confusion. The patient is nervous/anxious.     Vital Signs: BP (!) 134/90   Pulse 83   Resp 16  Ht 5' 1 (1.549 m)   Wt 163 lb (73.9  kg)   LMP 10/24/2023   SpO2 98%   BMI 30.80 kg/m     Physical Exam Vitals reviewed.  HENT:     Mouth/Throat:     Mouth: Mucous membranes are moist.  Cardiovascular:     Rate and Rhythm: Normal rate and regular rhythm.     Heart sounds: No murmur heard. Pulmonary:     Effort: Pulmonary effort is normal.     Breath sounds: Normal breath sounds. No wheezing.  Abdominal:     Palpations: Abdomen is soft.     Tenderness: There is no abdominal tenderness.  Musculoskeletal:        General: Normal range of motion.     Right lower leg: No edema.     Left lower leg: No edema.  Skin:    General: Skin is warm and dry.  Neurological:     Mental Status: She is alert and oriented to person, place, and time.  Psychiatric:        Behavior: Behavior normal.     Imaging: US  RENAL Result Date: 03/13/2024 CLINICAL DATA:  Initial evaluation for hematuria. EXAM: RENAL / URINARY TRACT ULTRASOUND COMPLETE COMPARISON:  Prior ultrasound from 08/23/2022. FINDINGS: Right Kidney: Renal measurements: 9.8 x 3.7 x 4.2 cm = volume: 79.0 mL. Increased echogenicity within the renal parenchyma. No nephrolithiasis or hydronephrosis. No focal renal mass. Left Kidney: Renal measurements: 10.2 x 5.0 x 4.8 cm = volume: 127.0 mL. Increased echogenicity within the renal parenchyma. No nephrolithiasis or hydronephrosis. No focal renal mass. Bladder: Appears normal for degree of bladder distention. Both ureteral jets are seen. Other: None. IMPRESSION: 1. Increased echogenicity within the renal parenchyma, suggestive of medical renal disease. 2. No sonographic evidence for nephrolithiasis or obstructive uropathy. Electronically Signed   By: Morene Hoard M.D.   On: 03/13/2024 06:14    Labs:  CBC: Recent Labs    10/13/23 1215 11/21/23 1335 12/21/23 1017 02/09/24 1318  WBC 5.8 5.3 5.4 6.6  HGB 11.8 11.3* 13.1 12.6  HCT 37.6 36.0 39.5 38.4  PLT 287 262 259 255    COAGS: Recent Labs    10/13/23 1215  02/09/24 1410  INR 0.9 1.0  APTT  --  29    BMP: Recent Labs    08/15/23 0840 11/21/23 1335 12/21/23 1004 01/24/24 0845 02/09/24 1318  NA 136 138 137  --  137  K 5.0 3.7 4.7  --  3.7  CL 101 106 103  --  102  CO2 26 24 21   --  22  GLUCOSE 88 82 93  --  128*  BUN 16 17 19   --  16  CALCIUM 9.3 8.5* 9.1  8.9 9.0 9.5  CREATININE 0.80 0.65 0.74  --  0.82  GFRNONAA  --  >60  --   --  >60    LIVER FUNCTION TESTS: Recent Labs    04/06/23 0948 08/15/23 0840 11/21/23 1335 12/21/23 1004 02/09/24 1318  BILITOT 0.3 0.2 0.2 0.3 0.2  AST 26 32* 23 17 56*  ALT 22 39* 21 17 127*  ALKPHOS 86  --  58 80 80  PROT 7.1 7.1 7.2 7.4 7.9  ALBUMIN 4.3  --  3.7 4.5 4.6    TUMOR MARKERS: No results for input(s): AFPTM, CEA, CA199, CHROMGRNA in the last 8760 hours.  Assessment and Plan:  Scheduled for random renal biopsy Risks and benefits of random renal biopsy was discussed  with the patient and/or patient's family including, but not limited to bleeding, infection, damage to adjacent structures or low yield requiring additional tests.  All of the questions were answered and there is agreement to proceed.  Consent signed and in chart.  Thank you for this interesting consult.  I greatly enjoyed meeting Nelva W Tesfaye and look forward to participating in their care.  A copy of this report was sent to the requesting provider on this date.  Electronically Signed: Sharlet DELENA Candle, PA-C 04/05/2024, 7:00 AM   I spent a total of  30 Minutes   in face to face in clinical consultation, greater than 50% of which was counseling/coordinating care for random renal biopsy "

## 2024-04-05 NOTE — Progress Notes (Signed)
 Pt and husband received discharge instructions, teach back performed. Iv removed, no complications. Kidney biopsy site is clean dry intact, site is soft, no signs of bleeding. Pt escorted out via wheelchair to husbands vehicle.

## 2024-04-05 NOTE — Procedures (Signed)
 Interventional Radiology Procedure Note  Procedure: CT guided random renal biopsy (LEFT)  Complications: None  Estimated Blood Loss: None  Recommendations: - Bedrest x 4 hrs - Path sent  Signed,  Wilkie LOIS Lent, MD

## 2024-07-15 ENCOUNTER — Ambulatory Visit: Admitting: Internal Medicine

## 2024-12-23 ENCOUNTER — Encounter: Admitting: Physician Assistant

## 2025-02-07 ENCOUNTER — Inpatient Hospital Stay

## 2025-02-07 ENCOUNTER — Inpatient Hospital Stay: Admitting: Hematology and Oncology
